# Patient Record
Sex: Male | Born: 1985 | Race: Black or African American | Hispanic: No | Marital: Single | Smoking: Former smoker
Health system: Southern US, Community
[De-identification: ages and names within clinical notes are randomized; demographics above are authoritative.]

## PROBLEM LIST (undated history)

## (undated) DIAGNOSIS — I10 Essential (primary) hypertension: Secondary | ICD-10-CM

## (undated) DIAGNOSIS — N189 Chronic kidney disease, unspecified: Secondary | ICD-10-CM

## (undated) DIAGNOSIS — I517 Cardiomegaly: Secondary | ICD-10-CM

## (undated) HISTORY — DX: Chronic kidney disease, unspecified: N18.9

## (undated) MED FILL — FIDAXOMICIN 200 MG TABLET: 200 200 mg | ORAL | 8 days supply | Qty: 16 | Fill #0

## (undated) NOTE — ED Provider Notes (Signed)
 Formatting of this note is different from the original. Images from the original note were not included. CHIEF COMPLAINT Chief Complaint  Patient presents with   Vascular Access Problem    Blisters and oozing from access site    HPI Austin Hardy is a 65 y.o. male who presents with rash to the left forearm.  Patient is a dialysis patient and has a fistula.  He says the last couple times he has had dialysis he had longer than usual bleeding and the nurse at dialysis used iodine and latex adhesive both of which he is allergic to.  He reports two square shaped patches that are oozing clear fluid and are pruritic and burning.  He says he believes he is allergic to what they are using.  Has not taken anything over-the-counter.  REVIEW OF SYSTEMS See HPI for further details. Review of systems otherwise negative.   PAST MEDICAL HISTORY Past Medical History:  Diagnosis Date   Blood transfusion, without reported diagnosis    Clostridium difficile colitis 10/12/2022   Epigastric pain    Fistula, arteriovenous, acquired    has old fistula right arm, may be used for secondary fistula creation. is to see vascular   Hemodialysis    m-w-f  in [place]   Hepatitis C antibody positive in blood 11/08/2022   Hyperlipidemia 01/08/2023   Hypertension 01/08/2023   Kidney disease approx 4 weeks   esrd dialysis m- w- f [place]   Kidney transplant rejection (HHS/HCC)    on hemodialysis   Limb alert care status    Right arm (has old fistula not completely thrombosed. May be used for secondary fistula creation)   Nausea and vomiting, unspecified vomiting type    S/P dialysis catheter insertion    chest   FAMILY HISTORY Family History  Problem Relation Name Age of Onset   Hypertension Mother     Hypertension Father     SOCIAL HISTORY Social History   Tobacco Use   Smoking status: Never    Passive exposure: Never   Smokeless tobacco: Never  Vaping Use   Vaping status: Never Used   Substance Use Topics   Alcohol use: No   Drug use: Yes    Frequency: 2.0 times per week    Types: Marijuana   CURRENT MEDICATIONS  Home Medications    TAKE these medications      Sig Dispense/Refill Start Comment  B complex-vitamin C-folic acid  tablet Commonly known as: NEPHROVITE RX  Take 1 Tablet by mouth Once Daily.  Refill: 0  cholecalciferol (vitamin D3) Tab tablet Commonly known as: VITAMIN D3  Take 2,000 Units by mouth Every morning.  Refill: 0  gabapentin 100 mg capsule Commonly known as: NEURONTIN  Take 100 mg by mouth Three times a day.  Refill: 0  oxyCODONE 10 mg immediate release tablet Commonly known as: ROXICODONE  Take 10 mg by mouth Once daily as needed for Pain.  Refill: 0  * predniSONE  10 mg tablet Commonly known as: DELTASONE   Take 3 Tabs by mouth Once Daily.  Refill: 0  * predniSONE  10 mg tablet Commonly known as: DELTASONE   Take 50mg  (5 tabs) on day 1, then take 40mg  (4 tabs) on day 2, then take 30mg  (3 tabs) on day 3, then take 20mg  (2 tabs) on day 4, then take 10mg  (1 tab) on day 5, then stop.  Dispense: 15 Tablet Refill: 0  traMADoL  50 mg tablet Commonly known as: ULTRAM   Take 50 mg by mouth Three  times a day as needed for Pain.  Refill: 0  VELPHORO 500 mg Chew Generic drug: sucroferric oxyhydroxide  Take 500 mg by mouth Three times a day. With meals  Refill: 0     * This list has 2 medication(s) that are the same as other medications prescribed for you. Read the directions carefully, and ask your doctor or other care provider to review them with you.        ALLERGIES Allergies  Allergen Reactions   Nsaids (Non-Steroidal Anti-Inflammatory Drug) Other (See Comments)    CANNOT TAKE DUE TO KIDNEY FAILURE  CANNOT TAKE DUE TO KIDNEY FAILURE    PHYSICAL EXAM VITAL SIGNS:  ED Triage Vitals [11/02/23 0413]  BP BP Manual or Automatic? Patient Position BP Location Heart Rate (Monitor)  140/83 Automatic -- -- --   Pulse  Pulse Source Respirations Temp Temp src  78 -- 16 99.2 F (37.3 C) --   SpO2 SPO2 Location O2 Delivery O2 Device O2 Flow Rate (l/min)  98 % -- Room air -- --   FIO2 (%) Pain Intensity 1 Exacerbated By Relieved By Quality  -- -- -- -- --   Duration      --       Constitutional: Well developed, Well nourished, No acute distress, Non-toxic appearance.  HENT: Normocephalic, Atraumatic, Bilateral external ears normal, Oropharynx moist, Nose normal.  Eyes: EOMI, Conjunctiva normal, No discharge. No icterus.   Neck: Normal range of motion, No tenderness, Supple, No stridor.  Cardiovascular: Regular rate and rhythm. No murmur.  Thorax & Lungs: No respiratory distress Skin: Over left AV fistula there are 2 square patches approximately 2 x 2 cm with grouped vessels with mild serous drainage.  No overlying erythema warmth or induration, no purulent drainage.  No crepitus. Extremities: AV fistula left forearm with palpable thrill. Neurologic: Alert & oriented x 3, Normal motor function, Normal sensory function, No focal deficits noted.  Psychiatric: Affect normal, Judgment normal, Mood normal.   RADIOLOGY/PROCEDURES  Results   None   No results found for this or any previous visit (from the past 24 hours).  COURSE & MEDICAL DECISION MAKING Pertinent Labs & Imaging studies reviewed. (See chart for details)  Patient presents with what looks like a contact dermatitis to his left forearm.  Do not see evidence of infection.  Will treat with steroid and antihistamine.  Patient advised to take antihistamines at home and will send in steroid taper.  He was advised to discontinue the use of the iodine gauze.  Medications  predniSONE  (DELTASONE ) tab 60 mg (has no administration in time range)  diphenhydrAMINE HCL (BENADRYL) cap 25 mg (has no administration in time range)     ED Disposition     ED Disposition  Discharge After Treatment   Condition  Stable   Comment  --      Discharge  References/Attachments   Contact Dermatitis ([facility](R) Instructions(ER/ED)) (English)   FINAL IMPRESSION Final diagnoses:  Allergic contact dermatitis due to adhesives     Portions of this note were created using [employer]. While attempts were made to ensure accuracy, some errors may be made during transcription.  In the event narcotics were prescribed, KASPER checked.   Electronically signed by Banks, Katelyn, MD at 11/02/2023  4:28 AM EDT

## (undated) NOTE — ED Notes (Signed)
 Formatting of this note might be different from the original. Discharge instructions and education provided at this time. Patient/family verbalizes understanding of discharge instructions and education. Follow up care and instructions reviewed with patient. No complaints noted at this time. VSS. Pt declines wheelchair and ambulated out of ED at this time to private vehicle.   Electronically signed by Zackary Millman, RN at 11/02/2023  4:58 AM EDT

---
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
## 2011-03-11 ENCOUNTER — Emergency Department (HOSPITAL_COMMUNITY): Payer: Self-pay

## 2011-03-11 ENCOUNTER — Emergency Department (HOSPITAL_COMMUNITY)
Admission: EM | Admit: 2011-03-11 | Discharge: 2011-03-11 | Disposition: A | Discharge status: Home / Self Care | Payer: Self-pay | Attending: Emergency Medicine | Admitting: Emergency Medicine

## 2011-03-11 ENCOUNTER — Encounter: Payer: Self-pay | Admitting: *Deleted

## 2011-03-11 DIAGNOSIS — R0602 Shortness of breath: Secondary | ICD-10-CM | POA: Insufficient documentation

## 2011-03-11 DIAGNOSIS — I10 Essential (primary) hypertension: Secondary | ICD-10-CM | POA: Insufficient documentation

## 2011-03-11 DIAGNOSIS — R42 Dizziness and giddiness: Secondary | ICD-10-CM | POA: Insufficient documentation

## 2011-03-11 DIAGNOSIS — R110 Nausea: Secondary | ICD-10-CM | POA: Insufficient documentation

## 2011-03-11 HISTORY — DX: Essential (primary) hypertension: I10

## 2011-03-11 LAB — GLUCOSE, CAPILLARY: Glucose-Capillary: 90 mg/dL (ref 70–99)

## 2011-03-11 MED ORDER — LISINOPRIL 10 MG PO TABS
10.0000 mg | ORAL_TABLET | ORAL | Status: DC
  Filled 2011-03-11: qty 1

## 2011-03-11 NOTE — ED Notes (Signed)
Pt denies any further questions upon discharge. 

## 2011-03-11 NOTE — ED Notes (Signed)
Pt resting quietly with eyes closed, no s/s of any pain or distress noted. Family at bedside, pt is awaiting disposition and re-evaluation by MD. Will continue to monitor pt.

## 2011-03-11 NOTE — ED Notes (Signed)
Patient here with c/o having elevated blood pressure.  States that he has history of HTN but was taken off medications when he was in [employer], 8 years ago.  Patient also complains of shortness of breath.  Patient also c/o dizziness and blurred vision.  Patient went to the health clinic and all his test were negative for diabetes.

## 2011-03-11 NOTE — ED Notes (Signed)
Pt reports having high blood pressure with hx and taken off of meds years ago. Pt denies any pain or complaints at this time.

## 2011-03-11 NOTE — ED Notes (Signed)
CBG was 90 

## 2011-03-11 NOTE — ED Provider Notes (Signed)
History     CSN: [id number] Arrival date & time: 03/11/2011 12:43 AM   First MD Initiated Contact with Patient 03/11/11 0148      Chief Complaint  Patient presents with  . Hypertension    (Consider location/radiation/quality/duration/timing/severity/associated sxs/prior treatment) Patient is a 25 y.o. male presenting with hypertension. The history is provided by the patient. The history is limited by the condition of the patient. No language interpreter was used.  Hypertension This is a recurrent problem. The current episode started more than 1 year ago. The problem has been gradually worsening. Associated symptoms include nausea. Pertinent negatives include no abdominal pain, chest pain, chills, congestion, coughing, diaphoresis, fatigue, fever, headaches, neck pain, numbness, sore throat, vomiting or weakness. The symptoms are aggravated by nothing. He has tried nothing for the symptoms.  Hypertension This is a recurrent problem. The current episode started more than 1 year ago. The problem has been gradually worsening. Pertinent negatives include no chest pain, no abdominal pain and no headaches. The symptoms are aggravated by nothing. He has tried nothing for the symptoms.   Here with intermittant lightheadedness and thinks that his high blood pressure is back.  Also wants to know if he has diabetes.  He was treated for hypertension in high school but was taken off the meds after he lost weight and his b/p was normal.  Family history of hypertension and diabetes. Needs a PCP and would like [place] to recommend.  Also states that he has been SOB intermittantly but no chest pain.  No acute distress noted or SOB. Ambulating without difficulty or dizziness.  Past Medical History  Diagnosis Date  . Hypertension     History reviewed. No pertinent past surgical history.  Family History  Problem Relation Age of Onset  . Diabetes Mother   . Hypertension Mother   . Diabetes Father   .  Hypertension Father     History  Substance Use Topics  . Smoking status: Current Some Day Smoker -- 0.5 packs/day  . Smokeless tobacco: Not on file  . Alcohol Use: Yes      Review of Systems  Constitutional: Negative for fever, chills, diaphoresis and fatigue.  HENT: Negative for congestion, sore throat and neck pain.   Respiratory: Negative for cough.   Cardiovascular: Negative for chest pain.  Gastrointestinal: Positive for nausea. Negative for vomiting and abdominal pain.  Neurological: Negative for weakness, numbness and headaches.  All other systems reviewed and are negative.    Allergies  Review of patient's allergies indicates no known allergies.  Home Medications   Current Outpatient Rx  Name Route Sig Dispense Refill  . ALBUTEROL SULFATE HFA 108 (90 BASE) MCG/ACT IN AERS Inhalation Inhale 2 puffs into the lungs once.      Marland Kitchen OVER THE COUNTER MEDICATION Oral Take 4 tablets by mouth once. Airborne otc Hold while in hospital       BP 139/75  Pulse 77  Temp(Src) 98.2 F (36.8 C) (Oral)  Resp 16  SpO2 100%  Physical Exam  Nursing note and vitals reviewed. Constitutional: He is oriented to person, place, and time. He appears well-developed and well-nourished. No distress.  HENT:  Head: Normocephalic.  Eyes: Pupils are equal, round, and reactive to light.  Neck: Normal range of motion.  Cardiovascular: Normal rate.  Exam reveals no gallop and no friction rub.   No murmur heard. Pulmonary/Chest: Effort normal and breath sounds normal. No respiratory distress. He has no wheezes.  Abdominal: Soft.  Musculoskeletal: Normal  range of motion. He exhibits no edema and no tenderness.  Neurological: He is alert and oriented to person, place, and time.  Skin: Skin is warm and dry.  Psychiatric: He has a normal mood and affect.    ED Course  Procedures (including critical care time)   Labs Reviewed  GLUCOSE, CAPILLARY  POCT CBG MONITORING   Dg Chest 2  View  03/11/2011  *RADIOLOGY REPORT*  Clinical Data: Shortness of breath  CHEST - 2 VIEW  Comparison: None.  Findings: Lungs are clear. No pleural effusion or pneumothorax. The cardiomediastinal contours are within normal limits. The visualized bones and soft tissues are without significant appreciable abnormality.  IMPRESSION: No acute cardiopulmonary process identified.  Original Report Authenticated By: Waneta Martins, M.D.     1. Hypertension       MDM  C/o ? Hypertension and ? Diabetes.    Monitored in er and he was hypertensive initially but 139/75 when resting. CBG was 90.  Referred to PCP's for follow up.  Instructed to keep B/P journal.  Did not start b/p meds today.  Chest x-ray unremarkable.          Jethro Bastos, NP 03/12/11 1439  Medical screening examination/treatment/procedure(s) were performed by non-physician practitioner and as supervising physician I was immediately available for consultation/collaboration.   Sunnie Nielsen, MD 03/13/11 [phone]

## 2011-05-25 HISTORY — PX: AV FISTULA PLACEMENT: SHX1204

## 2011-08-01 NOTE — Unmapped (Signed)
Formatting of this note might be different from the original.  Phone hx completed-Instructed patient to [facility] after MN, No make-up, jewelry, nail polish, excessive lotions, no smoking, gum, mints.  Follow MD instructions.  Show the night before and morning of, paying particular attention to surgery area.  Have someone with you to drive you home and be with you for 24 hours after surgery.  Verbalized understanding all instructions.      Electronically signed by Lanetta Inch at 08/01/2011 11:40 AM EDT

## 2012-03-01 ENCOUNTER — Emergency Department (INDEPENDENT_AMBULATORY_CARE_PROVIDER_SITE_OTHER)
Admission: EM | Admit: 2012-03-01 | Discharge: 2012-03-01 | Disposition: A | Discharge status: Home / Self Care | Payer: PRIVATE HEALTH INSURANCE | Source: Home / Self Care | Attending: Family Medicine | Admitting: Family Medicine

## 2012-03-01 ENCOUNTER — Encounter (HOSPITAL_COMMUNITY): Payer: Self-pay | Admitting: *Deleted

## 2012-03-01 DIAGNOSIS — H1045 Other chronic allergic conjunctivitis: Secondary | ICD-10-CM

## 2012-03-01 DIAGNOSIS — H1011 Acute atopic conjunctivitis, right eye: Secondary | ICD-10-CM

## 2012-03-01 MED ORDER — CETIRIZINE-PSEUDOEPHEDRINE ER 5-120 MG PO TB12: 1.0000 | ORAL_TABLET | Freq: Two times a day (BID) | ORAL | Status: DC

## 2012-03-01 MED ORDER — PREDNISONE 20 MG PO TABS: ORAL_TABLET | ORAL | Status: DC

## 2012-03-01 MED ORDER — KETOTIFEN FUMARATE 0.025 % OP SOLN: 1.0000 [drp] | Freq: Two times a day (BID) | OPHTHALMIC | Status: DC

## 2012-03-01 MED ORDER — ERYTHROMYCIN 5 MG/GM OP OINT: TOPICAL_OINTMENT | OPHTHALMIC | Status: DC

## 2012-03-01 NOTE — ED Notes (Signed)
Pt reports eye pain, drainage, redness for the past two days with no known injury

## 2012-03-01 NOTE — ED Provider Notes (Signed)
History     CSN: [id number]  Arrival date & time 03/01/12  2952   First MD Initiated Contact with Patient 03/01/12 0940      Chief Complaint  Patient presents with  . Eye Pain    (Consider location/radiation/quality/duration/timing/severity/associated sxs/prior treatment) HPI Comments: 26 year old male with history of asthma and allergic rhinitis. Here complaining of nasal congestion, sneezing and clear rhinorrhea for about a week. He's eyes have been itching as well and he has been rubbing them forcefully yesterday; right eye has been swollen and red for the last 2 days. Right eye with clear drainage and severe itchiness and burning type of pain. Denies purulent secretions. No pain with eye movement. No fever or chills.   Past Medical History  Diagnosis Date  . Hypertension     History reviewed. No pertinent past surgical history.  Family History  Problem Relation Age of Onset  . Diabetes Mother   . Hypertension Mother   . Diabetes Father   . Hypertension Father     History  Substance Use Topics  . Smoking status: Former Smoker -- 0.5 packs/day  . Smokeless tobacco: Not on file  . Alcohol Use: No      Review of Systems  Constitutional: Negative for fever and chills.  HENT: Positive for congestion, rhinorrhea, sneezing and postnasal drip. Negative for ear pain, sore throat, facial swelling and neck pain.   Eyes: Positive for pain, redness and itching. Negative for photophobia and visual disturbance.  Respiratory: Negative for cough, shortness of breath and wheezing.   Gastrointestinal: Negative for nausea and vomiting.  Skin: Negative for rash.  Neurological: Negative for dizziness and headaches.    Allergies  Review of patient's allergies indicates no known allergies.  Home Medications   Current Outpatient Rx  Name  Route  Sig  Dispense  Refill  . METOPROLOL TARTRATE 100 MG PO TABS   Oral   Take 100 mg by mouth 2 (two) times daily.         . ALBUTEROL  SULFATE HFA 108 (90 BASE) MCG/ACT IN AERS   Inhalation   Inhale 2 puffs into the lungs once.           Marland Kitchen CETIRIZINE-PSEUDOEPHEDRINE ER 5-120 MG PO TB12   Oral   Take 1 tablet by mouth 2 (two) times daily.   30 tablet   0   . ERYTHROMYCIN 5 MG/GM OP OINT      Place a 1/2 inch ribbon of ointment into the lower eyelid at night time daily for 5 days   1 g   0   . KETOTIFEN FUMARATE 0.025 % OP SOLN   Right Eye   Place 1 drop into the right eye 2 (two) times daily.   5 mL   0   . OVER THE COUNTER MEDICATION   Oral   Take 4 tablets by mouth once. Airborne otc Hold while in hospital          . PREDNISONE 20 MG PO TABS      2 tabs po daily for 5 days   10 tablet   0     BP 145/98  Pulse 62  Temp 98.9 F (37.2 C) (Oral)  Resp 18  SpO2 100%  Physical Exam  Nursing note and vitals reviewed. Constitutional: He is oriented to person, place, and time. He appears well-developed and well-nourished. No distress.  HENT:  Head: Normocephalic and atraumatic.  Right Ear: External ear normal.  Left Ear: External ear  normal.       Nasal Congestion with erythema and severe swelling of nasal turbinates, clear rhinorrhea. pharyngeal erythema no exudates. No uvula deviation. No trismus. TM's normal  Eyes: EOM are normal. Pupils are equal, round, and reactive to light. No scleral icterus.       Bilateral conjunctival erythema and clear tearing worse in right eye also associated with mld to moderate chemosis. No periorbital erythema, induration or pain with palpation or EOM. No exudates.  Neck: Neck supple.  Cardiovascular: Normal rate, regular rhythm and normal heart sounds.   Pulmonary/Chest: Effort normal and breath sounds normal. No respiratory distress. He has no wheezes. He has no rales. He exhibits no tenderness.  Lymphadenopathy:    He has no cervical adenopathy.  Neurological: He is alert and oriented to person, place, and time.  Skin: No rash noted. He is not diaphoretic.     ED Course  Procedures (including critical care time)  Labs Reviewed - No data to display No results found.   1. Allergic conjunctivitis of right eye       MDM  Impress allergic rhinitis and allergic conjunctivitis. Treated with prednisone, cetirizine/pseudoephedrine, ketotifen ophthalmic and erythromycin ointment for lubrication and infection prophylaxis only at nighttime. Ophthalmologist on call contact information provided to patient to followup if worsening symptoms despite following treatment.        Sharin Grave, MD 03/04/12 [phone]

## 2012-06-11 ENCOUNTER — Other Ambulatory Visit: Payer: Self-pay

## 2012-06-11 DIAGNOSIS — T82598A Other mechanical complication of other cardiac and vascular devices and implants, initial encounter: Secondary | ICD-10-CM

## 2012-06-24 ENCOUNTER — Encounter: Payer: Self-pay | Admitting: Vascular Surgery

## 2012-06-25 ENCOUNTER — Ambulatory Visit: Payer: PRIVATE HEALTH INSURANCE | Admitting: Vascular Surgery

## 2012-06-26 ENCOUNTER — Encounter: Payer: Self-pay | Admitting: Vascular Surgery

## 2012-06-27 ENCOUNTER — Encounter: Payer: Self-pay | Admitting: Vascular Surgery

## 2012-06-27 ENCOUNTER — Encounter (INDEPENDENT_AMBULATORY_CARE_PROVIDER_SITE_OTHER): Payer: Medicaid Other | Admitting: Vascular Surgery

## 2012-06-27 ENCOUNTER — Ambulatory Visit (INDEPENDENT_AMBULATORY_CARE_PROVIDER_SITE_OTHER): Payer: Medicaid Other | Admitting: Vascular Surgery

## 2012-06-27 VITALS — BP 127/77 | HR 75 | Resp 16 | Ht 70.0 in | Wt 226.0 lb

## 2012-06-27 DIAGNOSIS — M7989 Other specified soft tissue disorders: Secondary | ICD-10-CM

## 2012-06-27 DIAGNOSIS — T82598A Other mechanical complication of other cardiac and vascular devices and implants, initial encounter: Secondary | ICD-10-CM

## 2012-06-27 DIAGNOSIS — M79609 Pain in unspecified limb: Secondary | ICD-10-CM

## 2012-06-27 DIAGNOSIS — N186 End stage renal disease: Secondary | ICD-10-CM

## 2012-06-27 NOTE — Progress Notes (Addendum)
[facility]  Referred by:  Cecille Aver, MD [address] [place], [place] [address]  Reason for referral: New access pain during dialysis proximally above the antecubital area.    History of Present Illness  Austin Hardy is a 27 y.o. (Apr 20, 1985) male who presents for evaluation of his existing AV fistula permanent access.  The patient is right hand dominant.  The patient has not had previous access procedures other than the R RC AVF created in March 2013.  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   He had severe pain in his upper arm during dialysis during one episode.  During the last 2 dialysis treatments he has not had any pain or report of steal syndrome.  Past Medical History  Diagnosis Date  . Hypertension   . Chronic kidney disease     Past Surgical History  Procedure Laterality Date  . Fracture surgery Left 2005    Ankle  . Av fistula placement Right March 2013    History   Social History  . Marital Status: Single    Spouse Name: N/A    Number of Children: N/A  . Years of Education: N/A   Occupational History  . Not on file.   Social History Main Topics  . Smoking status: Former Smoker -- 0.50 packs/day    Quit date: 03/27/2003  . Smokeless tobacco: Never Used  . Alcohol Use: No  . Drug Use: No  . Sexually Active: Yes   Other Topics Concern  . Not on file   Social History Narrative  . No narrative on file    Family History  Problem Relation Age of Onset  . Diabetes Mother   . Hypertension Mother   . Diabetes Father   . Hypertension Father       Current Outpatient Prescriptions on File Prior to Visit  Medication Sig Dispense Refill  . metoprolol (LOPRESSOR) 100 MG tablet Take 100 mg by mouth 2 (two) times daily.      Marland Kitchen albuterol (PROVENTIL HFA;VENTOLIN HFA) 108 (90 BASE) MCG/ACT inhaler Inhale 2 puffs into the lungs once.        . cetirizine-pseudoephedrine (ZYRTEC-D) 5-120 MG per  tablet Take 1 tablet by mouth 2 (two) times daily.  30 tablet  0  . erythromycin ophthalmic ointment Place a 1/2 inch ribbon of ointment into the lower eyelid at night time daily for 5 days  1 g  0  . ketotifen (ZADITOR) 0.025 % ophthalmic solution Place 1 drop into the right eye 2 (two) times daily.  5 mL  0  . OVER THE COUNTER MEDICATION Take 4 tablets by mouth once. Airborne otc Hold while in hospital       . predniSONE (DELTASONE) 20 MG tablet 2 tabs po daily for 5 days  10 tablet  0   No current facility-administered medications on file prior to visit.    No Known Allergies   REVIEW OF SYSTEMS:  (Positives checked otherwise negative)  CARDIOVASCULAR:  []  chest pain, []  chest pressure, []  palpitations, []  shortness of breath when laying flat, []  shortness of breath with exertion,  []  pain in feet when walking, []  pain in feet when laying flat, []  history of blood clot in veins (DVT), []  history of phlebitis, []  swelling in legs, []  varicose veins  PULMONARY:  []  productive cough, []  asthma, []  wheezing (X) seasonal asthma symptoms  NEUROLOGIC:  []  weakness in arms or legs, []  numbness  in arms or legs, []  difficulty speaking or slurred speech, []  temporary loss of vision in one eye, []  dizziness  HEMATOLOGIC:  []  bleeding problems, []  problems with blood clotting too easily  MUSCULOSKEL:  []  joint pain, []  joint swelling  GASTROINTEST:  []  vomiting blood, []  blood in stool     GENITOURINARY:  []  burning with urination, []  blood in urine  PSYCHIATRIC:  []  history of major depression  INTEGUMENTARY:  []  rashes, []  ulcers  CONSTITUTIONAL:  []  fever, [x]  chills  PRE-ADM LIVING: []  Home, []  Nursing home, []  Homeless  AMB STATUS: []  Walking, []  Walking w/ Assistance, []  Wheelchair, [] Bed ridden  For [employer] Use RECENT HEART ATTACK (<6 mon): No  CAD Sx: [x]  No, []  Asx, h/o MI, []  Stable angina, []  Unstable angina  PRIOR CHF: [x]  No, []  Asx, []  Mild, []  Moderate, []   Severe  STRESS TEST: [x]  No, []  Normal, []  + ischemia, []  + MI, []  Both  Physical Examination  Filed Vitals:   06/27/12 1440  BP: 127/77  Pulse: 75  Resp: 16  Height: 5\' 10"  (1.778 m)  Weight: 226 lb (102.513 kg)  SpO2: 100%    Body mass index is 32.43 kg/(m^2).  General: A&O x 3, WDNC Head: [place]/AT Ear/Nose/Throat: Hearing grossly intact, nares w/o erythema or drainage, oropharynx w/o Erythema/Exudate,  Eyes: PE, EOMI Neck: Supple Pulmonary: Sym exp, good air movt, CTAB, no rales, rhonchi, & wheezing Cardiac: RRR, Nl S1, S2, no Murmurs, rubs or gallops Vascular: Vessel Right Left  Radial Palpable Palpable  Carotid Palpable, without bruit Palpable, without bruit  Aorta Not palpable N/A  Femoral Palpable Palpable  Popliteal Not palpable Not palpable  PT  Palpable  Palpable  DP  Palpable  Palpable   Gastrointestinal: soft, NTND, + bowel sounds on auscultation  Musculoskeletal: M/S 5/5 throughout Extremities without ischemic changes, palpable thrill   Psychiatric: Judgment intact, Mood & affect appropriate for pt's clinical situation  Dermatologic: See M/S exam for extremity exam, no rashes otherwise noted  Lymph : No Cervical, Axillary, or Inguinal lymphadenopathy    Non-Invasive Vascular Imaging  Vein Mapping  (Date: 06/27/2012):   R arm: acceptable vein conduits include diameter 1.51 distally to .95 proximally with depth average of .24-.31  Medical Decision Making  Austin Hardy is a 27 y.o. male who presents with h/o painful R RC AVF  Based on upper arm dialysis duplex his AV fistula is without flow or dialysis problems at this time.  He is on the path to Kidney transplant through [facility].  He will follow up on an as needed basis.  Austin Sake, MD [facility] Office: [phone] Pager: [phone]  06/27/2012, 3:10 PM  Addendum  I have independently interviewed and examined the patient, and I agree with the  physician assistant's findings.  Pt has a functional R RC AVF without any obvious etiology for the prior episode of pain.  The access duplex demonstrates a fully function AVF without any obvious compromised segments.  He can follow up with [place] as needed.  Austin Sake, MD [facility] [place] Office: [phone] Pager: [phone]  06/27/2012, 5:30 PM

## 2012-09-11 ENCOUNTER — Ambulatory Visit: Payer: Medicaid Other | Attending: Family Medicine | Admitting: Internal Medicine

## 2012-09-11 VITALS — BP 154/88 | HR 67 | Temp 97.8°F | Resp 16 | Wt 223.0 lb

## 2012-09-11 DIAGNOSIS — I10 Essential (primary) hypertension: Secondary | ICD-10-CM

## 2012-09-11 DIAGNOSIS — N186 End stage renal disease: Secondary | ICD-10-CM

## 2012-09-11 LAB — CBC WITH DIFFERENTIAL/PLATELET
Basophils Absolute: 0 10*3/uL (ref 0.0–0.1)
Basophils Relative: 1 % (ref 0–1)
Eosinophils Absolute: 0.1 10*3/uL (ref 0.0–0.7)
HCT: 27.8 % — ABNORMAL LOW (ref 39.0–52.0)
MCH: 31.7 pg (ref 26.0–34.0)
MCHC: 34.2 g/dL (ref 30.0–36.0)
Monocytes Absolute: 0.4 10*3/uL (ref 0.1–1.0)
Neutro Abs: 1.9 10*3/uL (ref 1.7–7.7)
RDW: 14.5 % (ref 11.5–15.5)

## 2012-09-11 MED ORDER — LISINOPRIL 20 MG PO TABS: 20.0000 mg | ORAL_TABLET | Freq: Every day | ORAL | Status: DC

## 2012-09-11 MED ORDER — METOPROLOL TARTRATE 100 MG PO TABS: 100.0000 mg | ORAL_TABLET | Freq: Two times a day (BID) | ORAL | Status: DC

## 2012-09-11 NOTE — Progress Notes (Unsigned)
Patient ID: Austin Hardy, male   DOB: April 12, 1985, 27 y.o.   MRN: [id number]  CC:  HPI: 27 year old, with a history of hypertension, end-stage renal disease on hemodialysis 3 times a week started 11 months ago, referred to our clinic for establishing care. The patient denies any chest pain shortness of breath is mildly hypertensive but states that he has been out of his metoprolol and has not taken it for about a week. Denies any chest pain any shortness of breath today. The patient is fairly active does not smoke, wants a second opinion about whether he should continue with his hemodialysis . He is on the transplant list, I encouraged him to continue with his hemodialysis and be compliant with it. He states that he still makes a good amount of urine. He gets regular labs done with his hemodialysis   No Known Allergies Past Medical History  Diagnosis Date  . Hypertension   . Chronic kidney disease    Current Outpatient Prescriptions on File Prior to Visit  Medication Sig Dispense Refill  . albuterol (PROVENTIL HFA;VENTOLIN HFA) 108 (90 BASE) MCG/ACT inhaler Inhale 2 puffs into the lungs once.        . cetirizine-pseudoephedrine (ZYRTEC-D) 5-120 MG per tablet Take 1 tablet by mouth 2 (two) times daily.  30 tablet  0  . erythromycin ophthalmic ointment Place a 1/2 inch ribbon of ointment into the lower eyelid at night time daily for 5 days  1 g  0  . ketotifen (ZADITOR) 0.025 % ophthalmic solution Place 1 drop into the right eye 2 (two) times daily.  5 mL  0  . OVER THE COUNTER MEDICATION Take 4 tablets by mouth once. Airborne otc Hold while in hospital       . predniSONE (DELTASONE) 20 MG tablet 2 tabs po daily for 5 days  10 tablet  0  . sevelamer carbonate (RENVELA) 800 MG tablet Take 800 mg by mouth 3 (three) times daily with meals. Three tab.per meal       No current facility-administered medications on file prior to visit.   Family History  Problem Relation Age of Onset  .  Diabetes Mother   . Hypertension Mother   . Diabetes Father   . Hypertension Father    History   Social History  . Marital Status: Single    Spouse Name: N/A    Number of Children: N/A  . Years of Education: N/A   Occupational History  . Not on file.   Social History Main Topics  . Smoking status: Former Smoker -- 0.50 packs/day    Quit date: 03/27/2003  . Smokeless tobacco: Never Used  . Alcohol Use: No  . Drug Use: No  . Sexually Active: Yes   Other Topics Concern  . Not on file   Social History Narrative  . No narrative on file    Review of Systems  Constitutional: Negative for fever, chills, diaphoresis, activity change, appetite change and fatigue.  HENT: Negative for ear pain, nosebleeds, congestion, facial swelling, rhinorrhea, neck pain, neck stiffness and ear discharge.   Eyes: Negative for pain, discharge, redness, itching and visual disturbance.  Respiratory: Negative for cough, choking, chest tightness, shortness of breath, wheezing and stridor.   Cardiovascular: Negative for chest pain, palpitations and leg swelling.  Gastrointestinal: Negative for abdominal distention.  Genitourinary: Negative for dysuria, urgency, frequency, hematuria, flank pain, decreased urine volume, difficulty urinating and dyspareunia.  Musculoskeletal: Negative for back pain, joint swelling, arthralgias and gait  problem.  Neurological: Negative for dizziness, tremors, seizures, syncope, facial asymmetry, speech difficulty, weakness, light-headedness, numbness and headaches.  Hematological: Negative for adenopathy. Does not bruise/bleed easily.  Psychiatric/Behavioral: Negative for hallucinations, behavioral problems, confusion, dysphoric mood, decreased concentration and agitation.    Objective:   Filed Vitals:   09/11/12 1230  BP: 154/88  Pulse: 67  Temp: 97.8 F (36.6 C)  Resp: 16    Physical Exam  Constitutional: Appears well-developed and well-nourished. No distress.   HENT: Normocephalic. External right and left ear normal. Oropharynx is clear and moist.  Eyes: Conjunctivae and EOM are normal. PERRLA, no scleral icterus.  Neck: Normal ROM. Neck supple. No JVD. No tracheal deviation. No thyromegaly.  CVS: RRR, S1/S2 +, no murmurs, no gallops, no carotid bruit.  Pulmonary: Effort and breath sounds normal, no stridor, rhonchi, wheezes, rales.  Abdominal: Soft. BS +,  no distension, tenderness, rebound or guarding.  Musculoskeletal: Normal range of motion. No edema and no tenderness.  Lymphadenopathy: No lymphadenopathy noted, cervical, inguinal. Neuro: Alert. Normal reflexes, muscle tone coordination. No cranial nerve deficit. Skin: Skin is warm and dry. No rash noted. Not diaphoretic. No erythema. No pallor.  Psychiatric: Normal mood and affect. Behavior, judgment, thought content normal.   No results found for this basename: WBC, HGB, HCT, MCV, PLT   No results found for this basename: CREATININE, BUN, NA, K, CL, CO2    No results found for this basename: HGBA1C   Lipid Panel  No results found for this basename: chol, trig, hdl, cholhdl, vldl, ldlcalc       Assessment and plan:   Patient Active Problem List   Diagnosis Date Noted  . Mechanical complication of other vascular device, implant, and graft 06/27/2012   End-stage renal disease Patient encouraged to continue with hemodialysis 3 times a week Refilled his metoprolol and lisinopril Will draw CBC, CMP, lipid panel, hemoglobin A1c today

## 2012-09-11 NOTE — Progress Notes (Unsigned)
Patient here to establish care Has history of HTN and end stage renal disease Goes to dialysis 3 times a week

## 2012-09-13 ENCOUNTER — Encounter: Payer: Self-pay | Admitting: Internal Medicine

## 2012-09-13 ENCOUNTER — Telehealth: Payer: Self-pay | Admitting: Internal Medicine

## 2012-09-13 ENCOUNTER — Telehealth: Payer: Self-pay | Admitting: Nephrology

## 2012-09-13 LAB — COMPREHENSIVE METABOLIC PANEL
AST: 16 U/L (ref 0–37)
Albumin: 4.4 g/dL (ref 3.5–5.2)
Alkaline Phosphatase: 84 U/L (ref 39–117)
BUN: 39 mg/dL — ABNORMAL HIGH (ref 6–23)
Creat: 11.9 mg/dL (ref 0.50–1.35)
Potassium: 4.7 mEq/L (ref 3.5–5.3)
Total Bilirubin: 1.2 mg/dL (ref 0.3–1.2)

## 2012-09-13 LAB — LIPID PANEL
HDL: 36 mg/dL — ABNORMAL LOW (ref >39)
LDL Cholesterol: 26 mg/dL (ref 0–99)
Total CHOL/HDL Ratio: 2.3 Ratio
Triglycerides: 107 mg/dL (ref <150)
VLDL: 21 mg/dL (ref 0–40)

## 2012-09-13 LAB — HEMOGLOBIN A1C: Hgb A1c MFr Bld: 4.7 % (ref <5.7)

## 2012-09-13 NOTE — Telephone Encounter (Signed)
[facility] lab  called with critical lab value.  Pt Creat  level is 11.90 ,Dr. Jerral Ralph was notified  and reviewed the chart. He didn not feel the need to have the patient to come to the ED   since he is in  end-stage renal disease on hemodialysis 3 times a week .

## 2012-11-19 ENCOUNTER — Ambulatory Visit: Payer: Medicaid Other

## 2013-04-10 ENCOUNTER — Other Ambulatory Visit: Payer: Self-pay | Admitting: Internal Medicine

## 2014-03-02 ENCOUNTER — Emergency Department (HOSPITAL_COMMUNITY): Payer: Medicaid Other

## 2014-03-02 ENCOUNTER — Encounter (HOSPITAL_COMMUNITY): Payer: Self-pay | Admitting: Emergency Medicine

## 2014-03-02 ENCOUNTER — Emergency Department (HOSPITAL_COMMUNITY)
Admission: EM | Admit: 2014-03-02 | Discharge: 2014-03-03 | Disposition: A | Discharge status: Home / Self Care | Payer: Medicaid Other | Attending: Emergency Medicine | Admitting: Emergency Medicine

## 2014-03-02 DIAGNOSIS — R2231 Localized swelling, mass and lump, right upper limb: Secondary | ICD-10-CM | POA: Diagnosis present

## 2014-03-02 DIAGNOSIS — I129 Hypertensive chronic kidney disease with stage 1 through stage 4 chronic kidney disease, or unspecified chronic kidney disease: Secondary | ICD-10-CM | POA: Insufficient documentation

## 2014-03-02 DIAGNOSIS — R509 Fever, unspecified: Secondary | ICD-10-CM | POA: Diagnosis not present

## 2014-03-02 DIAGNOSIS — N189 Chronic kidney disease, unspecified: Secondary | ICD-10-CM | POA: Insufficient documentation

## 2014-03-02 DIAGNOSIS — Z720 Tobacco use: Secondary | ICD-10-CM | POA: Insufficient documentation

## 2014-03-02 DIAGNOSIS — Z79899 Other long term (current) drug therapy: Secondary | ICD-10-CM | POA: Insufficient documentation

## 2014-03-02 DIAGNOSIS — L03113 Cellulitis of right upper limb: Secondary | ICD-10-CM | POA: Insufficient documentation

## 2014-03-02 DIAGNOSIS — Z8781 Personal history of (healed) traumatic fracture: Secondary | ICD-10-CM | POA: Diagnosis not present

## 2014-03-02 DIAGNOSIS — Z792 Long term (current) use of antibiotics: Secondary | ICD-10-CM | POA: Insufficient documentation

## 2014-03-02 DIAGNOSIS — B350 Tinea barbae and tinea capitis: Secondary | ICD-10-CM | POA: Insufficient documentation

## 2014-03-02 LAB — COMPREHENSIVE METABOLIC PANEL
ALT: 285 U/L — ABNORMAL HIGH (ref 0–53)
AST: 125 U/L — AB (ref 0–37)
Albumin: 3.6 g/dL (ref 3.5–5.2)
Alkaline Phosphatase: 77 U/L (ref 39–117)
Anion gap: 17 — ABNORMAL HIGH (ref 5–15)
BILIRUBIN TOTAL: 0.5 mg/dL (ref 0.3–1.2)
BUN: 62 mg/dL — AB (ref 6–23)
CHLORIDE: 91 meq/L — AB (ref 96–112)
CO2: 29 meq/L (ref 19–32)
CREATININE: 16.82 mg/dL — AB (ref 0.50–1.35)
Calcium: 9.3 mg/dL (ref 8.4–10.5)
GFR calc Af Amer: 4 mL/min — ABNORMAL LOW (ref >90)
GFR, EST NON AFRICAN AMERICAN: 3 mL/min — AB (ref >90)
Glucose, Bld: 85 mg/dL (ref 70–99)
Potassium: 4 mEq/L (ref 3.7–5.3)
Sodium: 137 mEq/L (ref 137–147)
Total Protein: 7 g/dL (ref 6.0–8.3)

## 2014-03-02 LAB — CBC WITH DIFFERENTIAL/PLATELET
BASOS ABS: 0 10*3/uL (ref 0.0–0.1)
Basophils Relative: 0 % (ref 0–1)
Eosinophils Absolute: 0.2 10*3/uL (ref 0.0–0.7)
Eosinophils Relative: 3 % (ref 0–5)
HEMATOCRIT: 27.8 % — AB (ref 39.0–52.0)
HEMOGLOBIN: 9.3 g/dL — AB (ref 13.0–17.0)
LYMPHS ABS: 1.7 10*3/uL (ref 0.7–4.0)
LYMPHS PCT: 34 % (ref 12–46)
MCH: 32.5 pg (ref 26.0–34.0)
MCHC: 33.5 g/dL (ref 30.0–36.0)
MCV: 97.2 fL (ref 78.0–100.0)
MONO ABS: 0.4 10*3/uL (ref 0.1–1.0)
MONOS PCT: 8 % (ref 3–12)
NEUTROS ABS: 2.7 10*3/uL (ref 1.7–7.7)
Neutrophils Relative %: 55 % (ref 43–77)
Platelets: 260 10*3/uL (ref 150–400)
RBC: 2.86 MIL/uL — AB (ref 4.22–5.81)
RDW: 15.8 % — ABNORMAL HIGH (ref 11.5–15.5)
WBC: 5 10*3/uL (ref 4.0–10.5)

## 2014-03-02 LAB — I-STAT CG4 LACTIC ACID, ED: LACTIC ACID, VENOUS: 0.78 mmol/L (ref 0.5–2.2)

## 2014-03-02 MED ORDER — FLUCONAZOLE 200 MG PO TABS: 200.0000 mg | ORAL_TABLET | ORAL | Status: AC

## 2014-03-02 MED ORDER — CLINDAMYCIN HCL 150 MG PO CAPS: 450.0000 mg | ORAL_CAPSULE | Freq: Four times a day (QID) | ORAL | Status: DC

## 2014-03-02 NOTE — ED Provider Notes (Signed)
CSN: [id number]     Arrival date & time 03/02/14  2133 History   First MD Initiated Contact with Patient 03/02/14 2215     Chief Complaint  Patient presents with  . Arm Swelling  . Fever  . Rash     (Consider location/radiation/quality/duration/timing/severity/associated sxs/prior Treatment) HPI  Patient's 28 year old male with a history of end-stage renal disease on dialysis who presents complaining of a rash on his scalp and right arm. He has had a red sore swollen rash on his right arm near his AV fistula for about a week and a half. He has been on oral doxycycline for a week that has not improved very much. He has not had any fevers or chills. He was sent here by dialysis, after he developed a rash on his scalp as well. He says that he first noticed the rash on his scalp about 4-5 days ago, when he noticed it was very itchy, then shaved his head. He said he had a red irritated rash on the frontal and posterior scalp that was not painful. He says that it is itchy.   Past Medical History  Diagnosis Date  . Hypertension   . Chronic kidney disease    Past Surgical History  Procedure Laterality Date  . Fracture surgery Left 2005    Ankle  . Av fistula placement Right March 2013   Family History  Problem Relation Age of Onset  . Diabetes Mother   . Hypertension Mother   . Diabetes Father   . Hypertension Father    History  Substance Use Topics  . Smoking status: Current Every Day Smoker -- 0.50 packs/day    Last Attempt to Quit: 03/27/2003  . Smokeless tobacco: Never Used  . Alcohol Use: No    Review of Systems  Constitutional: Negative for fever and fatigue.  Respiratory: Negative for cough and choking.   Gastrointestinal: Negative for abdominal pain and abdominal distention.  Skin: Positive for rash.  All other systems reviewed and are negative.     Allergies  Review of patient's allergies indicates no known allergies.  Home Medications   Prior to Admission  medications   Medication Sig Start Date End Date Taking? Authorizing Provider  lisinopril (PRINIVIL,ZESTRIL) 20 MG tablet Take 20 mg by mouth daily.   Yes Historical Provider, MD  metoprolol succinate (TOPROL-XL) 100 MG 24 hr tablet Take 100 mg by mouth daily. Take with or immediately following a meal.   Yes Historical Provider, MD  sevelamer carbonate (RENVELA) 800 MG tablet Take 2,400 mg by mouth 3 (three) times daily with meals. Three tab.per meal   Yes Historical Provider, MD  albuterol (PROVENTIL HFA;VENTOLIN HFA) 108 (90 BASE) MCG/ACT inhaler Inhale 2 puffs into the lungs once.      Historical Provider, MD  cetirizine-pseudoephedrine (ZYRTEC-D) 5-120 MG per tablet Take 1 tablet by mouth 2 (two) times daily. Patient not taking: Reported on 03/02/2014 03/01/12   Christin FudgeAdlih Moreno-Coll, MD  clindamycin (CLEOCIN) 150 MG capsule Take 3 capsules (450 mg total) by mouth 4 (four) times daily. X 7 days 03/02/14   Erskine Emeryhris Carisa Backhaus, MD  erythromycin ophthalmic ointment Place a 1/2 inch ribbon of ointment into the lower eyelid at night time daily for 5 days Patient not taking: Reported on 03/02/2014 03/01/12   Christin FudgeAdlih Moreno-Coll, MD  fluconazole (DIFLUCAN) 200 MG tablet Take 1 tablet (200 mg total) by mouth once a week. 03/02/14 04/20/14  Erskine Emeryhris Sheriece Jefcoat, MD  ketotifen (ZADITOR) 0.025 % ophthalmic solution Place 1 drop  into the right eye 2 (two) times daily. Patient not taking: Reported on 03/02/2014 03/01/12   Adlih Moreno-Coll, MD  lisinopril (PRINIVIL,ZESTRIL) 20 MG tablet TAKE ONE TABLET BY MOUTH ONCE DAILY 04/10/13   Richarda Overlie, MD  metoprolol (LOPRESSOR) 100 MG tablet Take 1 tablet (100 mg total) by mouth 2 (two) times daily. Patient not taking: Reported on 03/02/2014 09/11/12   Richarda Overlie, MD  OVER THE COUNTER MEDICATION Take 4 tablets by mouth once. Airborne [employer] while in hospital     Historical Provider, MD  predniSONE (DELTASONE) 20 MG tablet 2 tabs po daily for 5 days Patient not taking: Reported on 03/02/2014  03/01/12   Adlih Moreno-Coll, MD   BP 162/105 mmHg  Pulse 75  Temp(Src) 99.2 F (37.3 C)  Resp 12  Ht 5\' 11"  (1.803 m)  Wt 161 lb (73.029 kg)  BMI 22.46 kg/m2  SpO2 100% Physical Exam  Constitutional: He is oriented to person, place, and time. He appears well-developed and well-nourished. No distress.  HENT:  Head: Normocephalic and atraumatic.  Eyes: Pupils are equal, round, and reactive to light.  Neck: Normal range of motion. Neck supple. No thyromegaly present.  Cardiovascular: Normal rate and regular rhythm.   Pulmonary/Chest: Effort normal and breath sounds normal. No respiratory distress.  Abdominal: Soft. He exhibits no distension.  Musculoskeletal: Normal range of motion.  Neurological: He is alert and oriented to person, place, and time.  Skin: Skin is warm.  Nursing note and vitals reviewed.   ED Course  Procedures (including critical care time) Labs Review Labs Reviewed  CBC WITH DIFFERENTIAL - Abnormal; Notable for the following:    RBC 2.86 (*)    Hemoglobin 9.3 (*)    HCT 27.8 (*)    RDW 15.8 (*)    All other components within normal limits  COMPREHENSIVE METABOLIC PANEL - Abnormal; Notable for the following:    Chloride 91 (*)    BUN 62 (*)    Creatinine, Ser 16.82 (*)    AST 125 (*)    ALT 285 (*)    GFR calc non Af Amer 3 (*)    GFR calc Af Amer 4 (*)    Anion gap 17 (*)    All other components within normal limits  CULTURE, BLOOD (ROUTINE X 2)  CULTURE, BLOOD (ROUTINE X 2)  URINE CULTURE  URINALYSIS, ROUTINE W REFLEX MICROSCOPIC  I-STAT CG4 LACTIC ACID, ED    Imaging Review Dg Chest 2 View  03/02/2014   CLINICAL DATA:  Right arm infection, fever  EXAM: CHEST  2 VIEW  COMPARISON:  03/11/2011  FINDINGS: The heart size and mediastinal contours are within normal limits. Both lungs are clear. The visualized skeletal structures are unremarkable.  IMPRESSION: No active cardiopulmonary disease.   Electronically Signed   By: Elige Ko   On:  03/02/2014 22:37     EKG Interpretation None      MDM   Final diagnoses:  Tinea capitis  Cellulitis of right upper extremity   28 year old male with cellulitis and tinea capitis. Patient has mild induration and redness of his right forearm near his AV fistula, which has a good palpable thrill, and no evidence of erosion into his fistula. Scalp rash is round, with a raised border, and central hypopigmentation consistent with tinea capitis. We'll start him on once weekly Diflucan and have him wash with [facility]. As the patient cellulitis does not appear to be improving on his doxycycline, will switch him to  clindamycin, and have him follow up with his nephrologist.  Erskine Emery, MD 03/03/14 [id number]  Enid Skeens, MD 03/08/14 [phone]

## 2014-03-02 NOTE — ED Notes (Signed)
Patient went to dialysis last Tuesday, was accessed in right fistula, now arm is swollen, hand is swollen and patient is clammy and warm to the touch.  Patient is now having areas on his face and head of a rash, bilaterally on head and face.  Patient states that it itches.  He has been feeling febrile.  Patient is CAOx3.

## 2014-03-02 NOTE — Discharge Instructions (Signed)
WASH YOUR SCALP WITH SELSUN BLUE 3 TIMES A WEEK  Cellulitis Cellulitis is an infection of the skin and the tissue beneath it. The infected area is usually red and tender. Cellulitis occurs most often in the arms and lower legs.  CAUSES  Cellulitis is caused by bacteria that enter the skin through cracks or cuts in the skin. The most common types of bacteria that cause cellulitis are staphylococci and streptococci. SIGNS AND SYMPTOMS   Redness and warmth.  Swelling.  Tenderness or pain.  Fever. DIAGNOSIS  Your health care provider can usually determine what is wrong based on a physical exam. Blood tests may also be done. TREATMENT  Treatment usually involves taking an antibiotic medicine. HOME CARE INSTRUCTIONS   Take your antibiotic medicine as directed by your health care provider. Finish the antibiotic even if you start to feel better.  Keep the infected arm or leg elevated to reduce swelling.  Apply a warm cloth to the affected area up to 4 times per day to relieve pain.  Take medicines only as directed by your health care provider.  Keep all follow-up visits as directed by your health care provider. SEEK MEDICAL CARE IF:   You notice red streaks coming from the infected area.  Your red area gets larger or turns dark in color.  Your bone or joint underneath the infected area becomes painful after the skin has healed.  Your infection returns in the same area or another area.  You notice a swollen bump in the infected area.  You develop new symptoms.  You have a fever. SEEK IMMEDIATE MEDICAL CARE IF:   You feel very sleepy.  You develop vomiting or diarrhea.  You have a general ill feeling (malaise) with muscle aches and pains. MAKE SURE YOU:   Understand these instructions.  Will watch your condition.  Will get help right away if you are not doing well or get worse. Document Released: 12/20/2004 Document Revised: 07/27/2013 Document Reviewed:  05/28/2011 [facility] Patient Information 2015 [place], [place]. This information is not intended to replace advice given to you by your health care provider. Make sure you discuss any questions you have with your health care provider.  Ringworm of the Scalp Tinea Capitis is also called scalp ringworm. It is a fungal infection of the skin on the scalp seen mainly in children.  CAUSES  Scalp ringworm spreads from:  Other people.  Pets (cats and dogs) and animals.  Bedding, hats, combs or brushes shared with an infected person  Theater seats that an infected person sat in. SYMPTOMS  Scalp ringworm causes the following symptoms:  Flaky scales that look like dandruff.  Circles of thick, raised red skin.  Hair loss.  Red pimples or pustules.  Swollen glands in the back of the neck.  Itching. DIAGNOSIS  A skin scraping or infected hairs will be sent to test for fungus. Testing can be done either by looking under the microscope (KOH examination) or by doing a culture (test to try to grow the fungus). A culture can take up to 2 weeks to come back. TREATMENT   Scalp ringworm must be treated with medicine by mouth to kill the fungus for 6 to 8 weeks.  Medicated shampoos (ketoconazole or selenium sulfide shampoo) may be used to decrease the shedding of fungal spores from the scalp.  Steroid medicines are used for severe cases that are very inflamed in conjunction with antifungal medication.  It is important that any family members or pets  that have the fungus be treated. HOME CARE INSTRUCTIONS   Be sure to treat the rash completely - follow your caregiver's instructions. It can take a month or more to treat. If you do not treat it long enough, the rash can come back.  Watch for other cases in your family or pets.  Do not share brushes, combs, barrettes, or hats. Do not share towels.  Combs, brushes, and hats should be cleaned carefully and natural bristle brushes must be thrown  away.  It is not necessary to shave the scalp or wear a hat during treatment.  Children may attend school once they start treatment with the oral medicine.  Be sure to follow up with your caregiver as directed to be sure the infection is gone. SEEK MEDICAL CARE IF:   Rash is worse.  Rash is spreading.  Rash returns after treatment is completed.  The rash is not better in 2 weeks with treatment. Fungal infections are slow to respond to treatment. Some redness may remain for several weeks after the fungus is gone. SEEK IMMEDIATE MEDICAL CARE IF:  The area becomes red, warm, tender, and swollen.  Pus is oozing from the rash.  You or your child has an oral temperature above 102 F (38.9 C), not controlled by medicine. Document Released: 03/09/2000 Document Revised: 06/04/2011 Document Reviewed: 04/21/2008 [facility] Patient Information 2015 [place], [place]. This information is not intended to replace advice given to you by your health care provider. Make sure you discuss any questions you have with your health care provider.

## 2014-03-02 NOTE — ED Notes (Signed)
Patient states that he has been feeling bad ?fever (tonight 99.2) and has a rash on his head, scabs on his face from the area that was a rash, right arm swollen and stated he has had an area on his right forearm that looked just like the areas on his head.

## 2014-03-03 LAB — URINE MICROSCOPIC-ADD ON

## 2014-03-03 LAB — URINALYSIS, ROUTINE W REFLEX MICROSCOPIC
BILIRUBIN URINE: NEGATIVE
Glucose, UA: 100 mg/dL — AB
KETONES UR: NEGATIVE mg/dL
Leukocytes, UA: NEGATIVE
Nitrite: NEGATIVE
Specific Gravity, Urine: 1.023 (ref 1.005–1.030)
UROBILINOGEN UA: 0.2 mg/dL (ref 0.0–1.0)
pH: 7.5 (ref 5.0–8.0)

## 2014-03-03 NOTE — ED Notes (Signed)
IV documented on the wrong patient

## 2014-03-03 NOTE — ED Notes (Signed)
Discharge instructions and prescriptions reviewed.  Voiced understanding.  

## 2014-03-04 LAB — URINE CULTURE
Colony Count: NO GROWTH
Culture: NO GROWTH

## 2014-03-09 LAB — CULTURE, BLOOD (ROUTINE X 2)
Culture: NO GROWTH
Culture: NO GROWTH

## 2014-04-19 ENCOUNTER — Telehealth: Payer: Self-pay | Admitting: Internal Medicine

## 2014-04-19 NOTE — Telephone Encounter (Signed)
04/19/2014 Received fax of records on patient from [facility] for upcoming appointment on 04/21/2014 with Dr. Rennis Golden, records given to [facility].  cbr

## 2014-04-20 ENCOUNTER — Emergency Department (HOSPITAL_COMMUNITY): Payer: Medicaid Other

## 2014-04-20 ENCOUNTER — Emergency Department (HOSPITAL_COMMUNITY)
Admission: EM | Admit: 2014-04-20 | Discharge: 2014-04-20 | Disposition: A | Discharge status: Home / Self Care | Payer: Medicaid Other | Attending: Emergency Medicine | Admitting: Emergency Medicine

## 2014-04-20 ENCOUNTER — Encounter (HOSPITAL_COMMUNITY): Payer: Self-pay

## 2014-04-20 DIAGNOSIS — R0602 Shortness of breath: Secondary | ICD-10-CM | POA: Insufficient documentation

## 2014-04-20 DIAGNOSIS — R1110 Vomiting, unspecified: Secondary | ICD-10-CM | POA: Insufficient documentation

## 2014-04-20 DIAGNOSIS — E119 Type 2 diabetes mellitus without complications: Secondary | ICD-10-CM | POA: Diagnosis not present

## 2014-04-20 DIAGNOSIS — N186 End stage renal disease: Secondary | ICD-10-CM | POA: Diagnosis not present

## 2014-04-20 DIAGNOSIS — E875 Hyperkalemia: Secondary | ICD-10-CM | POA: Insufficient documentation

## 2014-04-20 DIAGNOSIS — I120 Hypertensive chronic kidney disease with stage 5 chronic kidney disease or end stage renal disease: Secondary | ICD-10-CM | POA: Insufficient documentation

## 2014-04-20 DIAGNOSIS — Z79899 Other long term (current) drug therapy: Secondary | ICD-10-CM | POA: Diagnosis not present

## 2014-04-20 DIAGNOSIS — R0989 Other specified symptoms and signs involving the circulatory and respiratory systems: Secondary | ICD-10-CM

## 2014-04-20 DIAGNOSIS — F17210 Nicotine dependence, cigarettes, uncomplicated: Secondary | ICD-10-CM | POA: Insufficient documentation

## 2014-04-20 LAB — BASIC METABOLIC PANEL
Anion gap: 22 — ABNORMAL HIGH (ref 5–15)
BUN: 102 mg/dL — AB (ref 6–23)
CALCIUM: 8.8 mg/dL (ref 8.4–10.5)
CO2: 23 mmol/L (ref 19–32)
CREATININE: 22.17 mg/dL — AB (ref 0.50–1.35)
Chloride: 92 mmol/L — ABNORMAL LOW (ref 96–112)
GFR, EST AFRICAN AMERICAN: 3 mL/min — AB (ref >90)
GFR, EST NON AFRICAN AMERICAN: 2 mL/min — AB (ref >90)
GLUCOSE: 100 mg/dL — AB (ref 70–99)
Potassium: 6.9 mmol/L (ref 3.5–5.1)
Sodium: 137 mmol/L (ref 135–145)

## 2014-04-20 LAB — I-STAT TROPONIN, ED: TROPONIN I, POC: 0.05 ng/mL (ref 0.00–0.08)

## 2014-04-20 LAB — CBC
HCT: 36.6 % — ABNORMAL LOW (ref 39.0–52.0)
Hemoglobin: 12 g/dL — ABNORMAL LOW (ref 13.0–17.0)
MCH: 33.2 pg (ref 26.0–34.0)
MCHC: 32.8 g/dL (ref 30.0–36.0)
MCV: 101.4 fL — AB (ref 78.0–100.0)
Platelets: 193 10*3/uL (ref 150–400)
RBC: 3.61 MIL/uL — AB (ref 4.22–5.81)
RDW: 14.5 % (ref 11.5–15.5)
WBC: 5 10*3/uL (ref 4.0–10.5)

## 2014-04-20 LAB — CBG MONITORING, ED
GLUCOSE-CAPILLARY: 59 mg/dL — AB (ref 70–99)
GLUCOSE-CAPILLARY: 136 mg/dL — AB (ref 70–99)

## 2014-04-20 MED ORDER — LIDOCAINE HCL (PF) 1 % IJ SOLN: 5.0000 mL | INTRAMUSCULAR | Status: DC | PRN

## 2014-04-20 MED ORDER — HEPARIN SODIUM (PORCINE) 1000 UNIT/ML DIALYSIS
1000.0000 [IU] | INTRAMUSCULAR | Status: DC | PRN
Start: 2014-04-20 — End: 2014-04-20

## 2014-04-20 MED ORDER — ONDANSETRON HCL 4 MG/2ML IJ SOLN
4.0000 mg | Freq: Once | INTRAMUSCULAR | Status: AC
  Administered 2014-04-20: 4 mg via INTRAVENOUS
  Filled 2014-04-20: qty 2

## 2014-04-20 MED ORDER — ALTEPLASE 2 MG IJ SOLR: 2.0000 mg | Freq: Once | INTRAMUSCULAR | Status: DC | PRN

## 2014-04-20 MED ORDER — SODIUM CHLORIDE 0.9 % IV SOLN: 100.0000 mL | INTRAVENOUS | Status: DC | PRN

## 2014-04-20 MED ORDER — HEPARIN SODIUM (PORCINE) 1000 UNIT/ML DIALYSIS: 6000.0000 [IU] | Freq: Once | INTRAMUSCULAR | Status: DC

## 2014-04-20 MED ORDER — SODIUM POLYSTYRENE SULFONATE 15 GM/60ML PO SUSP
30.0000 g | Freq: Once | ORAL | Status: AC
  Administered 2014-04-20: 30 g via ORAL
  Filled 2014-04-20: qty 120

## 2014-04-20 MED ORDER — SODIUM POLYSTYRENE SULFONATE 15 GM/60ML PO SUSP
15.0000 g | Freq: Once | ORAL | Status: DC
  Filled 2014-04-20: qty 60

## 2014-04-20 MED ORDER — IPRATROPIUM BROMIDE 0.02 % IN SOLN
0.5000 mg | Freq: Once | RESPIRATORY_TRACT | Status: AC
  Administered 2014-04-20: 0.5 mg via RESPIRATORY_TRACT
  Filled 2014-04-20: qty 2.5

## 2014-04-20 MED ORDER — ALBUTEROL SULFATE (2.5 MG/3ML) 0.083% IN NEBU
5.0000 mg | INHALATION_SOLUTION | Freq: Once | RESPIRATORY_TRACT | Status: AC
  Administered 2014-04-20: 5 mg via RESPIRATORY_TRACT
  Filled 2014-04-20: qty 6

## 2014-04-20 MED ORDER — NEPRO/CARBSTEADY PO LIQD: 237.0000 mL | ORAL | Status: DC | PRN

## 2014-04-20 MED ORDER — DEXTROSE 50 % IV SOLN
1.0000 | Freq: Once | INTRAVENOUS | Status: AC
  Administered 2014-04-20: 50 mL via INTRAVENOUS
  Filled 2014-04-20: qty 50

## 2014-04-20 MED ORDER — PENTAFLUOROPROP-TETRAFLUOROETH EX AERO
1.0000 | INHALATION_SPRAY | CUTANEOUS | Status: DC | PRN
Start: 2014-04-20 — End: 2014-04-20

## 2014-04-20 MED ORDER — LIDOCAINE-PRILOCAINE 2.5-2.5 % EX CREA: 1.0000 "application " | TOPICAL_CREAM | CUTANEOUS | Status: DC | PRN

## 2014-04-20 NOTE — ED Notes (Signed)
Tatyana, PA-C, at the bedside.  
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
## 2014-04-20 NOTE — ED Notes (Signed)
CRITICAL VALUE ALERT  Critical value received:  Potassium 6.9  Date of notification:  04/20/2014   Time of notification:  0730  Critical value read back:Yes.    Nurse who received alert:  Heide Guile  MD notified (1st page):  Tatyana PA  Time of first page:  [phone]

## 2014-04-20 NOTE — ED Notes (Signed)
Pt is a dialysis pt, normally goes on Tue, Thur, Sat, has not received in one week due to closings, pt now SOB and vomiting. Not able to receive dialysis until 12 pm today
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
## 2014-04-20 NOTE — Procedures (Signed)
I was present at this dialysis session, have reviewed the session itself and made  appropriate changes  Vinson Moselle MD (pgr) [phone]    ([phone] 04/20/2014, 10:37 AM

## 2014-04-20 NOTE — ED Notes (Signed)
Notified PA that pt's CBG 59. Gave pt some crackers and peanut butter and juice

## 2014-04-21 ENCOUNTER — Ambulatory Visit: Payer: Medicaid Other | Admitting: Internal Medicine

## 2014-04-24 ENCOUNTER — Encounter (HOSPITAL_COMMUNITY): Payer: Self-pay | Admitting: Emergency Medicine

## 2014-04-24 ENCOUNTER — Emergency Department (HOSPITAL_COMMUNITY): Payer: Medicaid Other

## 2014-04-24 ENCOUNTER — Inpatient Hospital Stay (HOSPITAL_COMMUNITY)
Admission: EM | Admit: 2014-04-24 | Discharge: 2014-04-27 | DRG: 189 | Disposition: A | Discharge status: Home / Self Care | Payer: Medicaid Other | Attending: Internal Medicine | Admitting: Internal Medicine

## 2014-04-24 ENCOUNTER — Inpatient Hospital Stay (HOSPITAL_COMMUNITY): Payer: Medicaid Other

## 2014-04-24 DIAGNOSIS — A047 Enterocolitis due to Clostridium difficile: Secondary | ICD-10-CM | POA: Diagnosis present

## 2014-04-24 DIAGNOSIS — I428 Other cardiomyopathies: Secondary | ICD-10-CM

## 2014-04-24 DIAGNOSIS — Z9119 Patient's noncompliance with other medical treatment and regimen: Secondary | ICD-10-CM | POA: Diagnosis present

## 2014-04-24 DIAGNOSIS — K529 Noninfective gastroenteritis and colitis, unspecified: Secondary | ICD-10-CM | POA: Diagnosis present

## 2014-04-24 DIAGNOSIS — R140 Abdominal distension (gaseous): Secondary | ICD-10-CM

## 2014-04-24 DIAGNOSIS — Z8249 Family history of ischemic heart disease and other diseases of the circulatory system: Secondary | ICD-10-CM

## 2014-04-24 DIAGNOSIS — J189 Pneumonia, unspecified organism: Secondary | ICD-10-CM | POA: Diagnosis present

## 2014-04-24 DIAGNOSIS — Y95 Nosocomial condition: Secondary | ICD-10-CM | POA: Diagnosis present

## 2014-04-24 DIAGNOSIS — Z992 Dependence on renal dialysis: Secondary | ICD-10-CM | POA: Diagnosis not present

## 2014-04-24 DIAGNOSIS — N186 End stage renal disease: Secondary | ICD-10-CM | POA: Insufficient documentation

## 2014-04-24 DIAGNOSIS — E875 Hyperkalemia: Secondary | ICD-10-CM | POA: Diagnosis present

## 2014-04-24 DIAGNOSIS — E440 Moderate protein-calorie malnutrition: Secondary | ICD-10-CM | POA: Insufficient documentation

## 2014-04-24 DIAGNOSIS — I120 Hypertensive chronic kidney disease with stage 5 chronic kidney disease or end stage renal disease: Secondary | ICD-10-CM | POA: Diagnosis present

## 2014-04-24 DIAGNOSIS — Z833 Family history of diabetes mellitus: Secondary | ICD-10-CM | POA: Diagnosis not present

## 2014-04-24 DIAGNOSIS — F1290 Cannabis use, unspecified, uncomplicated: Secondary | ICD-10-CM | POA: Diagnosis present

## 2014-04-24 DIAGNOSIS — J811 Chronic pulmonary edema: Secondary | ICD-10-CM

## 2014-04-24 DIAGNOSIS — J810 Acute pulmonary edema: Principal | ICD-10-CM | POA: Diagnosis present

## 2014-04-24 DIAGNOSIS — E8770 Fluid overload, unspecified: Secondary | ICD-10-CM | POA: Diagnosis present

## 2014-04-24 DIAGNOSIS — Z9114 Patient's other noncompliance with medication regimen: Secondary | ICD-10-CM

## 2014-04-24 DIAGNOSIS — I5041 Acute combined systolic (congestive) and diastolic (congestive) heart failure: Secondary | ICD-10-CM | POA: Diagnosis present

## 2014-04-24 DIAGNOSIS — Z79899 Other long term (current) drug therapy: Secondary | ICD-10-CM

## 2014-04-24 DIAGNOSIS — R0602 Shortness of breath: Secondary | ICD-10-CM | POA: Insufficient documentation

## 2014-04-24 DIAGNOSIS — I10 Essential (primary) hypertension: Secondary | ICD-10-CM | POA: Insufficient documentation

## 2014-04-24 DIAGNOSIS — N2581 Secondary hyperparathyroidism of renal origin: Secondary | ICD-10-CM | POA: Diagnosis present

## 2014-04-24 LAB — BASIC METABOLIC PANEL
ANION GAP: 21 — AB (ref 5–15)
BUN: 104 mg/dL — ABNORMAL HIGH (ref 6–23)
CO2: 22 mmol/L (ref 19–32)
Calcium: 9.2 mg/dL (ref 8.4–10.5)
Chloride: 92 mmol/L — ABNORMAL LOW (ref 96–112)
Creatinine, Ser: 22.01 mg/dL — ABNORMAL HIGH (ref 0.50–1.35)
GFR calc Af Amer: 3 mL/min — ABNORMAL LOW (ref >90)
GFR, EST NON AFRICAN AMERICAN: 2 mL/min — AB (ref >90)
GLUCOSE: 120 mg/dL — AB (ref 70–99)
Potassium: 5.7 mmol/L — ABNORMAL HIGH (ref 3.5–5.1)
SODIUM: 135 mmol/L (ref 135–145)

## 2014-04-24 LAB — CBC WITH DIFFERENTIAL/PLATELET
BASOS PCT: 0 % (ref 0–1)
Basophils Absolute: 0 10*3/uL (ref 0.0–0.1)
EOS PCT: 2 % (ref 0–5)
Eosinophils Absolute: 0.2 10*3/uL (ref 0.0–0.7)
HCT: 39.5 % (ref 39.0–52.0)
HEMOGLOBIN: 13.6 g/dL (ref 13.0–17.0)
LYMPHS ABS: 1.2 10*3/uL (ref 0.7–4.0)
Lymphocytes Relative: 18 % (ref 12–46)
MCH: 30.3 pg (ref 26.0–34.0)
MCHC: 34.4 g/dL (ref 30.0–36.0)
MCV: 88 fL (ref 78.0–100.0)
Monocytes Absolute: 0.7 10*3/uL (ref 0.1–1.0)
Monocytes Relative: 10 % (ref 3–12)
NEUTROS ABS: 4.7 10*3/uL (ref 1.7–7.7)
NEUTROS PCT: 70 % (ref 43–77)
PLATELETS: 254 10*3/uL (ref 150–400)
RBC: 4.49 MIL/uL (ref 4.22–5.81)
RDW: 13.7 % (ref 11.5–15.5)
WBC: 6.8 10*3/uL (ref 4.0–10.5)

## 2014-04-24 LAB — I-STAT ARTERIAL BLOOD GAS, ED
ACID-BASE DEFICIT: 6 mmol/L — AB (ref 0.0–2.0)
Bicarbonate: 18.8 mEq/L — ABNORMAL LOW (ref 20.0–24.0)
O2 Saturation: 98 %
TCO2: 20 mmol/L (ref 0–100)
pCO2 arterial: 34.9 mmHg — ABNORMAL LOW (ref 35.0–45.0)
pH, Arterial: 7.34 — ABNORMAL LOW (ref 7.350–7.450)
pO2, Arterial: 114 mmHg — ABNORMAL HIGH (ref 80.0–100.0)

## 2014-04-24 LAB — I-STAT TROPONIN, ED: Troponin i, poc: 0.07 ng/mL (ref 0.00–0.08)

## 2014-04-24 LAB — MRSA PCR SCREENING: MRSA BY PCR: NEGATIVE

## 2014-04-24 LAB — BRAIN NATRIURETIC PEPTIDE: B Natriuretic Peptide: 3984.2 pg/mL — ABNORMAL HIGH (ref 0.0–100.0)

## 2014-04-24 MED ORDER — PENTAFLUOROPROP-TETRAFLUOROETH EX AERO: 1.0000 "application " | INHALATION_SPRAY | CUTANEOUS | Status: DC | PRN

## 2014-04-24 MED ORDER — LIDOCAINE-PRILOCAINE 2.5-2.5 % EX CREA: 1.0000 "application " | TOPICAL_CREAM | CUTANEOUS | Status: DC | PRN

## 2014-04-24 MED ORDER — HEPARIN SODIUM (PORCINE) 1000 UNIT/ML DIALYSIS
6000.0000 [IU] | Freq: Once | INTRAMUSCULAR | Status: DC
  Filled 2014-04-24: qty 6

## 2014-04-24 MED ORDER — NEPRO/CARBSTEADY PO LIQD: 237.0000 mL | ORAL | Status: DC | PRN

## 2014-04-24 MED ORDER — DEXTROSE 5 % IV SOLN: 2.0000 g | INTRAVENOUS | Status: DC

## 2014-04-24 MED ORDER — LISINOPRIL 20 MG PO TABS
20.0000 mg | ORAL_TABLET | Freq: Every day | ORAL | Status: DC
  Administered 2014-04-25: 20 mg via ORAL
  Filled 2014-04-24 (×2): qty 1

## 2014-04-24 MED ORDER — ALTEPLASE 2 MG IJ SOLR
2.0000 mg | Freq: Once | INTRAMUSCULAR | Status: DC | PRN
  Filled 2014-04-24: qty 2

## 2014-04-24 MED ORDER — IPRATROPIUM-ALBUTEROL 0.5-2.5 (3) MG/3ML IN SOLN
3.0000 mL | RESPIRATORY_TRACT | Status: DC | PRN
  Administered 2014-04-24: 3 mL via RESPIRATORY_TRACT
  Filled 2014-04-24 (×2): qty 3

## 2014-04-24 MED ORDER — METOPROLOL SUCCINATE ER 100 MG PO TB24: 100.0000 mg | ORAL_TABLET | Freq: Every day | ORAL | Status: DC

## 2014-04-24 MED ORDER — HEPARIN SODIUM (PORCINE) 1000 UNIT/ML DIALYSIS
1000.0000 [IU] | INTRAMUSCULAR | Status: DC | PRN
  Filled 2014-04-24: qty 1

## 2014-04-24 MED ORDER — SEVELAMER CARBONATE 800 MG PO TABS
2400.0000 mg | ORAL_TABLET | Freq: Three times a day (TID) | ORAL | Status: DC
  Administered 2014-04-25 – 2014-04-27 (×7): 2400 mg via ORAL
  Filled 2014-04-24 (×11): qty 3

## 2014-04-24 MED ORDER — SODIUM CHLORIDE 0.9 % IV SOLN: 100.0000 mL | INTRAVENOUS | Status: DC | PRN

## 2014-04-24 MED ORDER — HEPARIN SODIUM (PORCINE) 5000 UNIT/ML IJ SOLN
5000.0000 [IU] | Freq: Three times a day (TID) | INTRAMUSCULAR | Status: DC
  Administered 2014-04-24 – 2014-04-27 (×7): 5000 [IU] via SUBCUTANEOUS
  Filled 2014-04-24 (×9): qty 1

## 2014-04-24 MED ORDER — AMLODIPINE BESYLATE 10 MG PO TABS: 10.0000 mg | ORAL_TABLET | Freq: Every day | ORAL | Status: DC

## 2014-04-24 MED ORDER — LABETALOL HCL 5 MG/ML IV SOLN
10.0000 mg | Freq: Once | INTRAVENOUS | Status: AC
  Administered 2014-04-24: 10 mg via INTRAVENOUS
  Filled 2014-04-24: qty 4

## 2014-04-24 MED ORDER — IPRATROPIUM BROMIDE 0.02 % IN SOLN
RESPIRATORY_TRACT | Status: AC
  Filled 2014-04-24: qty 2.5

## 2014-04-24 MED ORDER — VANCOMYCIN HCL 10 G IV SOLR
1750.0000 mg | INTRAVENOUS | Status: AC
  Administered 2014-04-24: 1750 mg via INTRAVENOUS
  Filled 2014-04-24: qty 1750

## 2014-04-24 MED ORDER — ALBUTEROL SULFATE (2.5 MG/3ML) 0.083% IN NEBU
INHALATION_SOLUTION | RESPIRATORY_TRACT | Status: AC
  Filled 2014-04-24: qty 6

## 2014-04-24 MED ORDER — METOPROLOL SUCCINATE ER 100 MG PO TB24
100.0000 mg | ORAL_TABLET | Freq: Every day | ORAL | Status: DC
  Filled 2014-04-24: qty 1

## 2014-04-24 MED ORDER — CEFEPIME HCL 1 G IJ SOLR: 1.0000 g | Freq: Once | INTRAMUSCULAR | Status: DC

## 2014-04-24 MED ORDER — IPRATROPIUM BROMIDE 0.02 % IN SOLN
0.5000 mg | Freq: Once | RESPIRATORY_TRACT | Status: AC
  Administered 2014-04-24: 0.5 mg via RESPIRATORY_TRACT
  Filled 2014-04-24: qty 2.5

## 2014-04-24 MED ORDER — AMLODIPINE BESYLATE 10 MG PO TABS
10.0000 mg | ORAL_TABLET | Freq: Every day | ORAL | Status: DC
  Administered 2014-04-24: 10 mg via ORAL
  Filled 2014-04-24 (×3): qty 1

## 2014-04-24 MED ORDER — SODIUM CHLORIDE 0.9 % IV SOLN
100.0000 mL | INTRAVENOUS | Status: DC | PRN
Start: 2014-04-24 — End: 2014-04-24

## 2014-04-24 MED ORDER — HYDRALAZINE HCL 20 MG/ML IJ SOLN
10.0000 mg | Freq: Four times a day (QID) | INTRAMUSCULAR | Status: DC | PRN
  Administered 2014-04-24: 10 mg via INTRAVENOUS
  Filled 2014-04-24: qty 1

## 2014-04-24 MED ORDER — LIDOCAINE HCL (PF) 1 % IJ SOLN: 5.0000 mL | INTRAMUSCULAR | Status: DC | PRN

## 2014-04-24 MED ORDER — LORAZEPAM 2 MG/ML IJ SOLN
0.5000 mg | Freq: Once | INTRAMUSCULAR | Status: AC
  Administered 2014-04-24: 0.5 mg via INTRAVENOUS
  Filled 2014-04-24: qty 1

## 2014-04-24 MED ORDER — VANCOMYCIN HCL IN DEXTROSE 1-5 GM/200ML-% IV SOLN
1000.0000 mg | INTRAVENOUS | Status: DC
  Filled 2014-04-24: qty 200

## 2014-04-24 MED ORDER — VANCOMYCIN HCL IN DEXTROSE 1-5 GM/200ML-% IV SOLN
1000.0000 mg | INTRAVENOUS | Status: DC
  Administered 2014-04-24: 1000 mg via INTRAVENOUS
  Filled 2014-04-24 (×2): qty 200

## 2014-04-24 MED ORDER — DEXTROSE 5 % IV SOLN
2.0000 g | Freq: Once | INTRAVENOUS | Status: AC
  Administered 2014-04-24: 2 g via INTRAVENOUS
  Filled 2014-04-24: qty 2

## 2014-04-24 MED ORDER — DEXTROSE 5 % IV SOLN
1.0000 g | Freq: Once | INTRAVENOUS | Status: AC
  Administered 2014-04-24: 1 g via INTRAVENOUS
  Filled 2014-04-24: qty 1

## 2014-04-24 MED ORDER — ALBUTEROL SULFATE (2.5 MG/3ML) 0.083% IN NEBU
5.0000 mg | INHALATION_SOLUTION | Freq: Once | RESPIRATORY_TRACT | Status: AC
  Administered 2014-04-24: 5 mg via RESPIRATORY_TRACT
  Filled 2014-04-24: qty 6

## 2014-04-24 MED ORDER — SODIUM CHLORIDE 0.9 % IJ SOLN
3.0000 mL | Freq: Two times a day (BID) | INTRAMUSCULAR | Status: DC
  Administered 2014-04-24 – 2014-04-27 (×6): 3 mL via INTRAVENOUS

## 2014-04-24 NOTE — ED Notes (Signed)
Pt feeling anxious and air hungry.

## 2014-04-24 NOTE — ED Provider Notes (Signed)
CSN: [id number]     Arrival date & time 04/24/14  0721 History   First MD Initiated Contact with Patient 04/24/14 0725     Chief Complaint  Patient presents with  . Shortness of Breath     (Consider location/radiation/quality/duration/timing/severity/associated sxs/prior Treatment) HPI  Austin Hardy Is a 29 year old male who presents emergency Department with chief complaint of shortness of breath. He he has a long-standing history of uncontrolled hypertension since he was in his teens. He has ESRD and is on dialysis Tuesday, Thursday and Saturday. This patient was seen 4 days ago with the same complaint. He states that he has had about 1 week of shortness of breath. He describes a cough productive of clear sputum. He states that he is having sleeplessness because he cannot lay flat at night. He describes symptoms of PND and orthopnea. He also states he has dyspnea on exertion. Patient states that 2 weeks ago, he would've been able to run down the hall. He states that now he is unable to walk several feet without severe shortness of breath and having to stop. He denies chest pain. He states that he has had chills, sweats, myalgias. last dialyzed yesterday. He endorses swelling of the left leg but states that that is chronic from a previous ankle fracture.   Past Medical History  Diagnosis Date  . Hypertension   . Chronic kidney disease    Past Surgical History  Procedure Laterality Date  . Fracture surgery Left 2005    Ankle  . Av fistula placement Right March 2013   Family History  Problem Relation Age of Onset  . Diabetes Mother   . Hypertension Mother   . Diabetes Father   . Hypertension Father    History  Substance Use Topics  . Smoking status: Current Every Day Smoker -- 0.50 packs/day    Last Attempt to Quit: 03/27/2003  . Smokeless tobacco: Never Used  . Alcohol Use: No    Review of Systems  Ten systems reviewed and are negative for acute change, except as noted  in the HPI.    Allergies  Review of patient's allergies indicates no known allergies.  Home Medications   Prior to Admission medications   Medication Sig Start Date End Date Taking? Authorizing Provider  lisinopril (PRINIVIL,ZESTRIL) 20 MG tablet TAKE ONE TABLET BY MOUTH ONCE DAILY 04/10/13  Yes Richarda Overlie, MD  metoprolol succinate (TOPROL-XL) 100 MG 24 hr tablet Take 100 mg by mouth daily. Take with or immediately following a meal.   Yes Historical Provider, MD  sevelamer carbonate (RENVELA) 800 MG tablet Take 2,400 mg by mouth 3 (three) times daily with meals. Three tab.per meal   Yes Historical Provider, MD  Vitamins A & D (VITAMIN A & D) ointment Apply 1 application topically daily.   Yes Historical Provider, MD  carvedilol (COREG) 25 MG tablet Take 1 tablet (25 mg total) by mouth 2 (two) times daily with a meal. 04/27/14   Dionne Ano, MD  cetirizine-pseudoephedrine (ZYRTEC-D) 5-120 MG per tablet Take 1 tablet by mouth 2 (two) times daily. Patient not taking: Reported on 03/02/2014 03/01/12   Christin Fudge Moreno-Coll, MD  clindamycin (CLEOCIN) 150 MG capsule Take 3 capsules (450 mg total) by mouth 4 (four) times daily. X 7 days Patient not taking: Reported on 04/20/2014 03/02/14   Erskine Emery, MD  erythromycin ophthalmic ointment Place a 1/2 inch ribbon of ointment into the lower eyelid at night time daily for 5 days Patient not taking: Reported  on 03/02/2014 03/01/12   Christin Fudge Moreno-Coll, MD  hydrALAZINE (APRESOLINE) 25 MG tablet Take 1 tablet (25 mg total) by mouth 3 (three) times daily. 04/27/14   Dionne Ano, MD  isosorbide mononitrate (IMDUR) 30 MG 24 hr tablet Take 2 tablets (60 mg total) by mouth daily. 04/27/14   Dionne Ano, MD  ketotifen (ZADITOR) 0.025 % ophthalmic solution Place 1 drop into the right eye 2 (two) times daily. Patient not taking: Reported on 03/02/2014 03/01/12   Christin Fudge Moreno-Coll, MD  metroNIDAZOLE (FLAGYL) 500 MG tablet Take 1 tablet (500 mg total) by mouth every 8 (eight)  hours. 04/27/14   Dionne Ano, MD  predniSONE (DELTASONE) 20 MG tablet 2 tabs po daily for 5 days Patient not taking: Reported on 03/02/2014 03/01/12   Adlih Moreno-Coll, MD   BP 138/88 mmHg  Pulse 87  Temp(Src) 97.8 F (36.6 C) (Oral)  Resp 17  Ht 5\' 11"  (1.803 m)  Wt 170 lb 10.2 oz (77.4 kg)  BMI 23.81 kg/m2  SpO2 100% Physical Exam  Constitutional: He appears well-developed and well-nourished. No distress.  HENT:  Head: Normocephalic and atraumatic.  Eyes: Conjunctivae are normal. No scleral icterus.  Neck: Normal range of motion. Neck supple.  Cardiovascular: Normal rate, regular rhythm and normal heart sounds.   Venous distention present on the chest wall. Minimal JVD. However, patient's jugular veins are very small. No pitting edema.  Pulmonary/Chest: Effort normal and breath sounds normal. No respiratory distress. He has no wheezes.  Lungs are clear  Abdominal: Soft. There is no tenderness.  Musculoskeletal: He exhibits no edema.  Neurological: He is alert.  Skin: Skin is warm and dry. He is not diaphoretic.  Psychiatric: His behavior is normal.  Nursing note and vitals reviewed.   ED Course  Procedures (including critical care time) Labs Review Labs Reviewed  CLOSTRIDIUM DIFFICILE BY PCR - Abnormal; Notable for the following:    C difficile by pcr POSITIVE (*)    All other components within normal limits  BASIC METABOLIC PANEL - Abnormal; Notable for the following:    Potassium 5.7 (*)    Chloride 92 (*)    Glucose, Bld 120 (*)    BUN 104 (*)    Creatinine, Ser 22.01 (*)    GFR calc non Af Amer 2 (*)    GFR calc Af Amer 3 (*)    Anion gap 21 (*)    All other components within normal limits  BRAIN NATRIURETIC PEPTIDE - Abnormal; Notable for the following:    B Natriuretic Peptide 3984.2 (*)    All other components within normal limits  BASIC METABOLIC PANEL - Abnormal; Notable for the following:    BUN 51 (*)    Creatinine, Ser 14.83 (*)    GFR calc non Af  Amer 4 (*)    GFR calc Af Amer 4 (*)    All other components within normal limits  CBC - Abnormal; Notable for the following:    RBC 4.07 (*)    All other components within normal limits  CBC - Abnormal; Notable for the following:    WBC 3.8 (*)    RBC 3.59 (*)    Hemoglobin 11.9 (*)    HCT 35.5 (*)    All other components within normal limits  RENAL FUNCTION PANEL - Abnormal; Notable for the following:    Chloride 95 (*)    Glucose, Bld 105 (*)    BUN 71 (*)    Creatinine, Ser 17.67 (*)  Phosphorus 11.0 (*)    Albumin 2.7 (*)    GFR calc non Af Amer 3 (*)    GFR calc Af Amer 4 (*)    Anion gap 17 (*)    All other components within normal limits  I-STAT ARTERIAL BLOOD GAS, ED - Abnormal; Notable for the following:    pH, Arterial 7.340 (*)    pCO2 arterial 34.9 (*)    pO2, Arterial 114.0 (*)    Bicarbonate 18.8 (*)    Acid-base deficit 6.0 (*)    All other components within normal limits  MRSA PCR SCREENING  CBC WITH DIFFERENTIAL/PLATELET  HEPATITIS B SURFACE ANTIGEN  HIV ANTIBODY (ROUTINE TESTING)  I-STAT TROPOININ, ED    Imaging Review Dg Abd 1 View  04/27/2014   CLINICAL DATA:  Abdominal distention. Left lower quadrant abdominal swelling.  EXAM: ABDOMEN - 1 VIEW  COMPARISON:  None.  FINDINGS: There is no evidence of free intra-abdominal air on this supine view. No dilated bowel loops to suggest obstruction. No radiopaque calculi. Vascular or seminal vesicle calcifications are seen in the pelvis. No acute osseous abnormalities are seen.  IMPRESSION: Nonobstructive bowel gas pattern.   Electronically Signed   By: Rubye Oaks M.D.   On: 04/27/2014 01:12     EKG Interpretation   Date/Time:  Saturday April 24 2014 07:45:28 EST Ventricular Rate:  99 PR Interval:  171 QRS Duration: 86 QT Interval:  359 QTC Calculation: 461 R Axis:   -4 Text Interpretation:  Sinus rhythm Ventricular premature complex Probable  left atrial enlargement Nonspecific T  abnormalities, lateral leads No  significant change since 4 days ago Confirmed by Gwendolyn Grant  MD, BLAIR ([id number])  on 04/24/2014 7:48:05 AM      MDM   Final diagnoses:  HCAP (healthcare-associated pneumonia)  ESRD (end stage renal disease)  Acute pulmonary edema  Essential hypertension    2:30 PM BP 138/88 mmHg  Pulse 87  Temp(Src) 97.8 F (36.6 C) (Oral)  Resp 17  Ht  (1.803 m)  Wt 170 lb 10.2 oz (77.4 kg)  BMI 23.81 kg/m2  SpO2 100% Patient chest x-ray positive for what looks to be a healthcare acquired pneumonia. I feel strongly that there is a component of pulmonary edema and the patient's pro BNP is almost 4000. Have begun the patient on cefepime and vancomycin per pharmacy for renal dose dosing. It is due for dialysis today. I feel he will need dialysis center is likely a component of pulmonary edema. Patient will also need admission for his pneumonia. His creatinine appears to be at the baseline. He has mild elevation of his potassium at 5.7. Glucose is slightly elevated. His CBC is unremarkable without leukocytosis. He is afebrile and hemodynamically stable with significantly elevated blood pressure throughout his stay.    Arthor Captain, PA-C 04/27/14 1430  Elwin Mocha, MD 04/27/14 [phone]

## 2014-04-24 NOTE — Procedures (Signed)
I was present at this dialysis session, have reviewed the session itself and made  appropriate changes  Vinson Moselle MD (pgr) [phone]    [phone] 04/24/2014, 7:33 PM

## 2014-04-24 NOTE — ED Notes (Signed)
Pt reports he has dropped dry weight and on 4 diff BP meds; pt states he has been dealing with SOB for 2 weeks. Was recently seen here and cannot sleep through night without breathing difficulties. Just moved here from [place] and does not have PCP. Pt believes all this BP meds and dropping dry weight is causing poor reactions. Pt's lung sounds mostly clear with slight wheezing. Has had fistula infections but thrill and bruit.

## 2014-04-24 NOTE — H&P (Signed)
Date: 04/24/2014               Patient Name:  Austin Hardy MRN: [id number]  DOB: October 31, 1985 Age / Sex: 29 y.o., male   PCP: Cecille Aver, MD         Medical Service: Internal Medicine Teaching Service         Attending Physician: Dr. Earl Lagos, MD    First Contact: Dr. Senaida Ores Pager: [phone]  Second Contact: Dr. Yetta Barre Pager: [phone]       After Hours (After 5p/  First Contact Pager: [phone]  weekends / holidays): Second Contact Pager: [phone]   Chief Complaint: shortness of breath  History of Present Illness: Austin Hardy is a 29 yo male with PMHx of uncontrolled HTN and ESRD on HD TTS who presents for worsening shortness of breath. Patient states he has been dealing with increasing shortness of breath for the past 2 weeks. Shortness of breath is worse with exertion, but occurs even at rest. He cannot lay flat and must sleep sitting straight up. However, patient has not been able to sleep due to shortness of breath and has been watching movies for the past 3 nights. His shortness of breath is associated PND, orthopnea, DOE, productive cough with clear sputum and wheezing. He also admits to fever and chills. He denies headache, chest pain, nausea, vomiting or pain. Patient was dialyzed for a complete session yesterday. He feels that he is at his dry weight.   Patient has a history of uncontrolled hypertension diagnosed in his early teens. Patient had a kidney biopsy when he was young which was normal. Patient developed CKD and ESRD secondary to uncontrolled HTN and has been on dialysis for 3 years- TTS schedule. Patient and mother are unaware if he was ever diagnosed with a secondary cause of hypertension. Patient recently moved here about 1 year ago. He follows with nephrology but does not have a PCP. He has been compliant with HD and medications. Previous work up was done at [facility] in [place], [place] and [facility] in [place], [place] with Dr.  Chevis Pretty.   Of note, patient used to weigh 357 and currently weighs 179 pounds. Patient has poor appetite and believes he has lost most of the weight from increased activity. He used to play semi-pro football.  Meds:  (Not in a hospital admission)  Current Facility-Administered Medications  Medication Dose Route Frequency Provider Last Rate Last Dose  . albuterol (PROVENTIL) (2.5 MG/3ML) 0.083% nebulizer solution 5 mg  5 mg Nebulization Once Abigail Harris, PA-C      . albuterol (PROVENTIL) (2.5 MG/3ML) 0.083% nebulizer solution           . ipratropium (ATROVENT) 0.02 % nebulizer solution           . ipratropium (ATROVENT) nebulizer solution 0.5 mg  0.5 mg Nebulization Once Abigail Harris, PA-C      . ipratropium-albuterol (DUONEB) 0.5-2.5 (3) MG/3ML nebulizer solution 3 mL  3 mL Nebulization Q4H PRN Giovannina Mun Richardson, MD      . vancomycin (VANCOCIN) 1,750 mg in sodium chloride 0.9 % 500 mL IVPB  1,750 mg Intravenous NOW Ann Held, RPH 250 mL/hr at 04/24/14 0959 1,750 mg at 04/24/14 5784   Current Outpatient Prescriptions  Medication Sig Dispense Refill  . lisinopril (PRINIVIL,ZESTRIL) 20 MG tablet TAKE ONE TABLET BY MOUTH ONCE DAILY 60 tablet 0  . metoprolol (LOPRESSOR) 100 MG tablet Take 1 tablet (100 mg total) by  mouth 2 (two) times daily. (Patient taking differently: Take 100 mg by mouth daily. ) 120 tablet 2  . sevelamer carbonate (RENVELA) 800 MG tablet Take 2,400 mg by mouth 3 (three) times daily with meals. Three tab.per meal    . Vitamins A & D (VITAMIN A & D) ointment Apply 1 application topically daily.    . cetirizine-pseudoephedrine (ZYRTEC-D) 5-120 MG per tablet Take 1 tablet by mouth 2 (two) times daily. (Patient not taking: Reported on 03/02/2014) 30 tablet 0  . clindamycin (CLEOCIN) 150 MG capsule Take 3 capsules (450 mg total) by mouth 4 (four) times daily. X 7 days (Patient not taking: Reported on 04/20/2014) 60 capsule 0  . erythromycin ophthalmic ointment Place a 1/2  inch ribbon of ointment into the lower eyelid at night time daily for 5 days (Patient not taking: Reported on 03/02/2014) 1 g 0  . ketotifen (ZADITOR) 0.025 % ophthalmic solution Place 1 drop into the right eye 2 (two) times daily. (Patient not taking: Reported on 03/02/2014) 5 mL 0  . predniSONE (DELTASONE) 20 MG tablet 2 tabs po daily for 5 days (Patient not taking: Reported on 03/02/2014) 10 tablet 0    Allergies: Allergies as of 04/24/2014  . (No Known Allergies)   Past Medical History  Diagnosis Date  . Hypertension   . Chronic kidney disease    Past Surgical History  Procedure Laterality Date  . Fracture surgery Left 2005    Ankle  . Av fistula placement Right March 2013   Family History  Problem Relation Age of Onset  . Diabetes Mother   . Hypertension Mother   . Diabetes Father   . Hypertension Father    History   Social History  . Marital Status: Single    Spouse Name: N/A    Number of Children: N/A  . Years of Education: N/A   Occupational History  . Not on file.   Social History Main Topics  . Smoking status: Current Every Day Smoker -- 0.50 packs/day    Last Attempt to Quit: 03/27/2003  . Smokeless tobacco: Never Used  . Alcohol Use: No  . Drug Use: No  . Sexual Activity: Yes   Other Topics Concern  . Not on file   Social History Narrative    Review of Systems: General: Admits to fever, chills, fatigue, decreased appetite and diaphoresis.  Respiratory: Admits to SOB, productive cough, DOE, PND, orthopnea, chest tightness, and wheezing.   Cardiovascular: Denies chest pain and palpitations.  Gastrointestinal: Denies nausea, vomiting, abdominal pain Musculoskeletal: Denies myalgias Skin: Denies pallor Neurological: Denies dizziness, headaches, weakness, lightheadedness Psychiatric/Behavioral: Admits to sleep disturbance  Physical Exam: Filed Vitals:   04/24/14 0930 04/24/14 1000 04/24/14 1030 04/24/14 1100  BP: 175/133 179/128 192/144 187/142    Pulse: 122 102 103 100  Temp:      TempSrc:      Resp: 13 20 23 23   SpO2: 99% 95% 98% 100%   General: Vital signs reviewed.  Patient is well-developed and well-nourished, in mild acute distress and cooperative with exam.  Cardiovascular: Tachycardic, regular rhythm, S1 normal, S2 normal, no murmurs, gallops, or rubs. Pulmonary/Chest: Coarse breath sounds with diffuse inspiratory and expiratory wheezes and rhonchi. No rales auscultated Abdominal: Soft, non-tender, non-distended, BS + Extremities: No lower extremity edema bilaterally. R upper extremity AVF with good thrill and bruit.  Neurological: A&O x3 Skin: Warm, dry and intact. No rashes or erythema. Psychiatric: Agitated  Lab results: Basic Metabolic Panel:  Recent Labs  04/24/14 0746  NA 135  K 5.7*  CL 92*  CO2 22  GLUCOSE 120*  BUN 104*  CREATININE 22.01*  CALCIUM 9.2   CBC:  Recent Labs  04/24/14 0746  WBC 6.8  NEUTROABS 4.7  HGB 13.6  HCT 39.5  MCV 88.0  PLT 254   Imaging results:  Dg Chest 2 View  04/24/2014   CLINICAL DATA:  Shortness of breath.  EXAM: CHEST  2 VIEW  COMPARISON:  April 20, 2014.  FINDINGS: Stable cardiomegaly. No pneumothorax or significant pleural effusion is noted. Increased right lower lobe opacity is noted compared to prior exam consistent with worsening pneumonia or edema. Stable Kerley B-lines are noted laterally in left lung base concerning for pulmonary edema. Bony thorax is intact.  IMPRESSION: Stable mild pulmonary edema seen in left lung base. Increased right lower lobe opacity is noted concerning for pneumonia or edema.   Electronically Signed   By: Roque Lias M.D.   On: 04/24/2014 08:12    Other results: EKG: sinus rhythm.  Assessment & Plan by Problem: Active Problems:   HCAP (healthcare-associated pneumonia)  HCAP versus Pulmonary Edema: Patient presented with increasing shortness of breath over 2 weeks with orthopnea, PND and DOE and at rest. On admission, patient  is afebrile at 98.1, hypertensive at 188/135, tachycardic at 97, RR 18, satting 100% on room air. On physical exam, patient has coarse breath sounds with diffuse inspiratory and expiratory wheezes and rhonchi, but no rales are auscultated. No lower extremity edema bilaterally. Labs showed no leukocytosis and hyperkalemia of 5.7. EKG showed sinus rhythm and CXR revealed stable mild pulmonary edema in left lower lung bases with increased right sided right lower lobe opacity which is concerning for pneumonia versus edema. Patient completed dialysis session fully yesterday. Patient could be having flash pulmonary edema from uncontrolled hypertension, however, these symptoms have been increasing over 2 weeks. Patient also could be volume overloaded, however, patient does not clinically appear this way on exam and has been complaint with HD. Pneumonia is also a possibility; however, patient is afebrile and without leukocytosis. We will treat for conditions above and assess improvement.  -Consulted Nephrology, appreciate recommendations -Cardiac Monitoring -Renal diet -Cefepime  -Vancomycin -Control BP -CBC/BMET tomorrow am -Daily weights -Repeat EKG tomorrow am -Repeat CXR 2 view tomorrow am -Arterial blood gas -Duonebs Q4H prn  -Echo  ESRD on HD TTS: Last completed HD yesterday. Patient is on Renvela at home. Patient will likely go for HD today. -Continue HD -Nephrology consulted, appreciate recommendations  Hyperkalemia: 5.7 on admission. No chest pain or arrhythmias. Going for HD today. No peaked TWs.  -HD -Repeat BMET tomorrow am -Repeat EKG tomorrow ma -Cardiac monitor  HTN: Patient is on lisinopril 20 mg daily, amlodipine 10 mg daily and Toprol XL 100 mg daily at home. Patient reports compliance. Hypertensive on admission at 188/135. Patient was diagnosed with hypertension in his teens. Strong family history, but secondary cause of hypertension is unclear. Patient used to be obese, but now  has a normal BMI without improvement of hypertension. -Amlodipine 10 mg daily -Toprol XL 100 mg daily -Lisinopril 20 mg daily -Labetalol 10 mg IV once   DVT/PE ppx: Heparin TID  Dispo: Disposition is deferred at this time, awaiting improvement of current medical problems. Anticipated discharge in approximately 1-2 day(s).   The patient does have a current PCP (Cecille Aver, MD) and does need an [facility] hospital follow-up appointment after discharge.  The patient does not have transportation limitations  that hinder transportation to clinic appointments.  Signed: Jill Alexanders, DO PGY-1 Internal Medicine Resident Pager # [phone] 04/24/2014 11:14 AM

## 2014-04-24 NOTE — ED Notes (Signed)
Pt ambulating to restroom; refused bedside commode.

## 2014-04-24 NOTE — Consult Note (Signed)
Renal Service Consult Note [facility]  Austin Hardy 04/24/2014 Austin Hardy D Requesting Physician:  Dr Heide Spark  Reason for Consult:  ESRD pt with SOB HPI: The patient is a 29 y.o. year-old with hx of HTN many years, ESRD d/t HTN on HD x 3 years. Moved from [place], gets HD now at [facility]  Reports SOB and orthopnea. CXR in ED +pulm edema.  Says he has N/V and anorexia, fatigue for some time, and wt loss and muscle loss, but vague on duration.  Says he was only getting HD 2 days per week when he moved here and recently was moved up to 3 days per week. Says he only runs 3 hr treatments, however [facility] has him at 4h sessions.   ROS  no fever, CP, or abd pain  no jt pain  no access issues  Past Medical History  Past Medical History  Diagnosis Date  . Hypertension   . Chronic kidney disease    Past Surgical History  Past Surgical History  Procedure Laterality Date  . Fracture surgery Left 2005    Ankle  . Av fistula placement Right March 2013   Family History  Family History  Problem Relation Age of Onset  . Diabetes Mother   . Hypertension Mother   . Diabetes Father   . Hypertension Father    Social History  reports that he has been smoking.  He has never used smokeless tobacco. He reports that he does not drink alcohol or use illicit drugs. Allergies No Known Allergies Home medications Prior to Admission medications   Medication Sig Start Date End Date Taking? Authorizing Provider  lisinopril (PRINIVIL,ZESTRIL) 20 MG tablet TAKE ONE TABLET BY MOUTH ONCE DAILY 04/10/13  Yes Richarda Overlie, MD  metoprolol (LOPRESSOR) 100 MG tablet Take 1 tablet (100 mg total) by mouth 2 (two) times daily. Patient taking differently: Take 100 mg by mouth daily.  09/11/12  Yes Richarda Overlie, MD  sevelamer carbonate (RENVELA) 800 MG tablet Take 2,400 mg by mouth 3 (three) times daily with meals. Three tab.per meal   Yes Historical Provider, MD  Vitamins A & D (VITAMIN A & D)  ointment Apply 1 application topically daily.   Yes Historical Provider, MD  cetirizine-pseudoephedrine (ZYRTEC-D) 5-120 MG per tablet Take 1 tablet by mouth 2 (two) times daily. Patient not taking: Reported on 03/02/2014 03/01/12   Christin Fudge Moreno-Coll, MD  clindamycin (CLEOCIN) 150 MG capsule Take 3 capsules (450 mg total) by mouth 4 (four) times daily. X 7 days Patient not taking: Reported on 04/20/2014 03/02/14   Erskine Emery, MD  erythromycin ophthalmic ointment Place a 1/2 inch ribbon of ointment into the lower eyelid at night time daily for 5 days Patient not taking: Reported on 03/02/2014 03/01/12   Christin Fudge Moreno-Coll, MD  ketotifen (ZADITOR) 0.025 % ophthalmic solution Place 1 drop into the right eye 2 (two) times daily. Patient not taking: Reported on 03/02/2014 03/01/12   Christin Fudge Moreno-Coll, MD  predniSONE (DELTASONE) 20 MG tablet 2 tabs po daily for 5 days Patient not taking: Reported on 03/02/2014 03/01/12   Sharin Grave, MD   Liver Function Tests No results for input(s): AST, ALT, ALKPHOS, BILITOT, PROT, ALBUMIN in the last 168 hours. No results for input(s): LIPASE, AMYLASE in the last 168 hours. CBC  Recent Labs Lab 04/20/14 0628 04/24/14 0746  WBC 5.0 6.8  NEUTROABS  --  4.7  HGB 12.0* 13.6  HCT 36.6* 39.5  MCV 101.4* 88.0  PLT 193  254   Basic Metabolic Panel  Recent Labs Lab 04/20/14 0628 04/24/14 0746  NA 137 135  K 6.9* 5.7*  CL 92* 92*  CO2 23 22  GLUCOSE 100* 120*  BUN 102* 104*  CREATININE 22.17* 22.01*  CALCIUM 8.8 9.2    Filed Vitals:   04/24/14 1000 04/24/14 1030 04/24/14 1100 04/24/14 1128  BP: 179/128 192/144 187/142   Pulse: 102 103 100   Temp:      TempSrc:      Resp: 20 23 23    SpO2: 95% 98% 100% 100%   Exam Alert, no distress No rash, cyanosis or gangrene Sclera anicteric, throat clear +JVD Bibasilar insp rales 1/2 up, no wheezing RRR no MRG Abd soft, NTND, no ascites or tenderness Trace edema LLE pretib, no edema R R arm AVF  patent Neuro is nf, Ox 3, no asterixis  HD: [place] [facility] 4h   450/800   2/2.0 Bath  80kg   Heparin 6000  RFA AVF Calcitriol 1.0 ug TIW   Assessment: 1. Pulm edema / SOB - most likely vol overload, but need ECHO as well. 2. HTN longstanding - BP's very high, get vol down, cont BP meds 3. ESRD on HD 4. 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    Vinson Moselle MD (pgr) [phone]    [phone] 04/24/2014, 11:31 AM

## 2014-04-24 NOTE — ED Notes (Signed)
Admitting MD at bedside.

## 2014-04-24 NOTE — Discharge Summary (Addendum)
Name: Austin Hardy MRN: [id number] DOB: [birth year] 29 y.o. PCP: Austin Aver, MD  Date of Admission: 04/24/2014  7:23 AM Date of Discharge: 04/27/2014 Attending Physician: Austin Lagos, MD  Discharge Diagnosis:  Principal Problem:   Acute pulmonary edema Active Problems:   Volume overload   ESRD (end stage renal disease)   Essential hypertension   Shortness of breath   Heart failure with reduced ejection fraction   Malnutrition of moderate degree  Discharge Medications:   Medication List    STOP taking these medications        clindamycin 150 MG capsule  Commonly known as:  CLEOCIN     lisinopril 20 MG tablet  Commonly known as:  PRINIVIL,ZESTRIL     metoprolol succinate 100 MG 24 hr tablet  Commonly known as:  TOPROL-XL     predniSONE 20 MG tablet  Commonly known as:  DELTASONE      TAKE these medications        carvedilol 25 MG tablet  Commonly known as:  COREG  Take 1 tablet (25 mg total) by mouth 2 (two) times daily with a meal.     cetirizine-pseudoephedrine 5-120 MG per tablet  Commonly known as:  ZYRTEC-D  Take 1 tablet by mouth 2 (two) times daily.     erythromycin ophthalmic ointment  Place a 1/2 inch ribbon of ointment into the lower eyelid at night time daily for 5 days     hydrALAZINE 25 MG tablet  Commonly known as:  APRESOLINE  Take 1 tablet (25 mg total) by mouth 3 (three) times daily.     isosorbide mononitrate 30 MG 24 hr tablet  Commonly known as:  IMDUR  Take 2 tablets (60 mg total) by mouth daily.     ketotifen 0.025 % ophthalmic solution  Commonly known as:  ZADITOR  Place 1 drop into the right eye 2 (two) times daily.     metroNIDAZOLE 500 MG tablet  Commonly known as:  FLAGYL  Take 1 tablet (500 mg total) by mouth every 8 (eight) hours.     RENVELA 800 MG tablet  Generic drug:  sevelamer carbonate  Take 2,400 mg by mouth 3 (three) times daily with meals. Three tab.per meal     vitamin A & D ointment  Apply  1 application topically daily.        Disposition and follow-up:   Austin Hardy was discharged from [facility] in Good condition.  At the hospital follow up visit please address:  1.  Hypertension: [name] admitted noncompliance with his amlodipine (and is not agreeable to taking it in the future). A new anti-hypertensive regimen was initiated with input from renal and cardiology; it includes imdur 30 mg daily, hydralazine 25 mg TID, carvedilol 25 mg BID. Please assess BP and the patient's compliance with these medications; he would like to control his BP, but will be more compliant if his wishes are taken into account.  Patient initially was thought to possibly have pneumonia but improved with volume management was not consistent with pneumonia.  Antibiotics were therefore stopped.    Cardiology is setting up stress test outpatient  2.  Labs / imaging needed at time of follow-up:   3.  Pending labs/ test needing follow-up:   Follow-up Appointments:   Discharge Instructions:   Consultations: Treatment Team:  Austin Krabbe, MD  Procedures Performed:  Dg Chest 2 View  04/24/2014   CLINICAL DATA:  Shortness of  breath.  EXAM: CHEST  2 VIEW  COMPARISON:  April 20, 2014.  FINDINGS: Stable cardiomegaly. No pneumothorax or significant pleural effusion is noted. Increased right lower lobe opacity is noted compared to prior exam consistent with worsening pneumonia or edema. Stable Kerley B-lines are noted laterally in left lung base concerning for pulmonary edema. Bony thorax is intact.  IMPRESSION: Stable mild pulmonary edema seen in left lung base. Increased right lower lobe opacity is noted concerning for pneumonia or edema.   Electronically Signed   By: Austin Hardy M.D.   On: 04/24/2014 08:12   Dg Chest 2 View (if Patient Has Fever And/or Copd)  04/20/2014   CLINICAL DATA:  Shortness of breath.  EXAM: CHEST  2 VIEW  COMPARISON:  03/02/2014.  FINDINGS:  Mediastinum and hilar structures are normal. Cardiomegaly with bilateral pulmonary interstitial prominence including Kerley B-lines. Findings consistent with congestive heart failure. There is a right lower lobe alveolar infiltrate. This could represent focal pulmonary edema or pneumonia. No pleural effusion or pneumothorax.  IMPRESSION: 1. Cardiomegaly with bilateral pulmonary interstitial prominence including Kerley B-lines. These findings are consistent with congestive heart failure. 2. Right lower low alveolar infiltrate. This could represent focal pulmonary edema and/or pneumonia.   Electronically Signed   By: Austin Hardy   On: 04/20/2014 07:10   Dg Chest Port 1v Same Day  04/24/2014   CLINICAL DATA:  29 year old male with a history of pulmonary edema. Hypertension and chronic kidney disease.  EXAM: PORTABLE CHEST - 1 VIEW SAME DAY  COMPARISON:  Chest x-ray 04/24/2014, 04/20/2014, 03/02/2014, 03/11/2011  FINDINGS: Cardiomediastinal silhouette unchanged in size and contour, though partially obscured by overlying lung and pleural disease.  Compared to the prior, there is increasing opacity at the bilateral lung bases partially obscuring the hemidiaphragms and the heart borders. Mixture of interstitial and airspace disease, with interlobular septal thickening.  No displaced fracture.  IMPRESSION: Worsening mixed airspace and interstitial disease compared to the recent chest x-ray, concerning for worsening edema, although infection cannot be excluded.  Signed,  Austin Hardy  [facility]  [facility]   Electronically Signed   By: Austin Hardy D.O.   On: 04/24/2014 12:16    2D Echo:   Admission HPI: [name] is a 29 yo male with PMHx of uncontrolled HTN and ESRD on HD TTS who presents for worsening shortness of breath. Patient states he has been dealing with increasing shortness of breath for the past 2 weeks. Shortness of breath is worse with  exertion, but occurs even at rest. He cannot lay flat and must sleep sitting straight up. However, patient has not been able to sleep due to shortness of breath and has been watching movies for the past 3 nights. His shortness of breath is associated PND, orthopnea, DOE, productive cough with clear sputum and wheezing. He also admits to fever and chills. He denies headache, chest pain, nausea, vomiting or pain. Patient was dialyzed for a complete session yesterday. He feels that he is at his dry weight.   Patient has a history of uncontrolled hypertension diagnosed in his early teens. Patient had a kidney biopsy when he was young which was normal. Patient developed CKD and ESRD secondary to uncontrolled HTN and has been on dialysis for 3 years- TTS schedule. Patient and mother are unaware if he was ever diagnosed with a secondary cause of hypertension. Patient recently moved here about 1 year ago. He follows with nephrology but does not  have a PCP. He has been compliant with HD and medications. Previous work up was done at [facility] in [place], [place] and [facility] in [place], [place] with Dr. Chevis Pretty.   Of note, patient used to weigh 357 and currently weighs 179 pounds. Patient has poor appetite and believes he has lost most of the weight from increased activity. He used to play semi-pro football.  Hospital Course by problem list: Active Problems:   HCAP (healthcare-associated pneumonia)   Volume overload   Acute pulmonary edema   ESRD (end stage renal disease)   Essential hypertension   Shortness of breath     Discharge Vitals:   BP 190/128 mmHg  Pulse 107  Temp(Src) 97.5 F (36.4 C) (Oral)  Resp 26  Ht  (1.803 m)  Wt 87.8 kg (193 lb 9 oz)  BMI 27.01 kg/m2  SpO2 100%  Discharge Labs:  Results for orders placed or performed during the hospital encounter of 04/24/14 (from the past 24 hour(s))  Basic metabolic panel     Status: Abnormal   Collection Time:  04/24/14  7:46 AM  Result Value Ref Range   Sodium 135 135 - 145 mmol/L   Potassium 5.7 (H) 3.5 - 5.1 mmol/L   Chloride 92 (L) 96 - 112 mmol/L   CO2 22 19 - 32 mmol/L   Glucose, Bld 120 (H) 70 - 99 mg/dL   BUN 161 (H) 6 - 23 mg/dL   Creatinine, Ser 09.60 (H) 0.50 - 1.35 mg/dL   Calcium 9.2 8.4 - 45.4 mg/dL   GFR calc non Af Amer 2 (L) >90 mL/min   GFR calc Af Amer 3 (L) >90 mL/min   Anion gap 21 (H) 5 - 15  CBC with Differential     Status: None   Collection Time: 04/24/14  7:46 AM  Result Value Ref Range   WBC 6.8 4.0 - 10.5 K/uL   RBC 4.49 4.22 - 5.81 MIL/uL   Hemoglobin 13.6 13.0 - 17.0 g/dL   HCT 09.8 11.9 - 14.7 %   MCV 88.0 78.0 - 100.0 fL   MCH 30.3 26.0 - 34.0 pg   MCHC 34.4 30.0 - 36.0 g/dL   RDW 82.9 56.2 - 13.0 %   Platelets 254 150 - 400 K/uL   Neutrophils Relative % 70 43 - 77 %   Neutro Abs 4.7 1.7 - 7.7 K/uL   Lymphocytes Relative 18 12 - 46 %   Lymphs Abs 1.2 0.7 - 4.0 K/uL   Monocytes Relative 10 3 - 12 %   Monocytes Absolute 0.7 0.1 - 1.0 K/uL   Eosinophils Relative 2 0 - 5 %   Eosinophils Absolute 0.2 0.0 - 0.7 K/uL   Basophils Relative 0 0 - 1 %   Basophils Absolute 0.0 0.0 - 0.1 K/uL  Brain natriuretic peptide     Status: Abnormal   Collection Time: 04/24/14  7:46 AM  Result Value Ref Range   B Natriuretic Peptide 3984.2 (H) 0.0 - 100.0 pg/mL  I-stat troponin, ED     Status: None   Collection Time: 04/24/14  8:09 AM  Result Value Ref Range   Troponin i, poc 0.07 0.00 - 0.08 ng/mL   Comment 3          I-Stat arterial blood gas, ED     Status: Abnormal   Collection Time: 04/24/14 11:15 AM  Result Value Ref Range   pH, Arterial 7.340 (L) 7.350 - 7.450  pCO2 arterial 34.9 (L) 35.0 - 45.0 mmHg   pO2, Arterial 114.0 (H) 80.0 - 100.0 mmHg   Bicarbonate 18.8 (L) 20.0 - 24.0 mEq/L   TCO2 20 0 - 100 mmol/L   O2 Saturation 98.0 %   Acid-base deficit 6.0 (H) 0.0 - 2.0 mmol/L   Collection site RADIAL, ALLEN'S TEST ACCEPTABLE    Drawn by Operator     Sample type ARTERIAL   MRSA PCR Screening     Status: None   Collection Time: 04/24/14  2:17 PM  Result Value Ref Range   MRSA by PCR NEGATIVE NEGATIVE    Signed: Dionne Ano, MD 04/24/2014, 7:38 PM

## 2014-04-24 NOTE — ED Notes (Signed)
RT to come draw gas.

## 2014-04-24 NOTE — ED Notes (Signed)
Pt stated that he was having trouble breathing. O2 sats were 93% on room air.

## 2014-04-24 NOTE — Progress Notes (Addendum)
ANTIBIOTIC CONSULT NOTE - INITIAL  Pharmacy Consult for Vancomycin + Cefepime  Indication: pneumonia  No Known Allergies  Patient Measurements:    Ht: 71 inches Wt: 81.2 kg  Vital Signs: Temp: 98.1 F (36.7 C) (01/30 0739) Temp Source: Oral (01/30 0739) BP: 188/135 mmHg (01/30 0739) Pulse Rate: 97 (01/30 0739) Intake/Output from previous day:   Intake/Output from this shift:    Labs:  Recent Labs  04/24/14 0746  WBC 6.8  HGB 13.6  PLT 254  CREATININE 22.01*   Estimated Creatinine Clearance: 5.3 mL/min (by C-G formula based on Cr of 22.01). No results for input(s): VANCOTROUGH, VANCOPEAK, VANCORANDOM, GENTTROUGH, GENTPEAK, GENTRANDOM, TOBRATROUGH, TOBRAPEAK, TOBRARND, AMIKACINPEAK, AMIKACINTROU, AMIKACIN in the last 72 hours.   Microbiology: No results found for this or any previous visit (from the past 720 hour(s)).  Medical History: Past Medical History  Diagnosis Date  . Hypertension   . Chronic kidney disease     Assessment: 23 YOM with ESRD (schedule appears to be TTS) who presented to the [facility] on 1/30 with SOB x 2 weeks. Pharmacy consulted to start Vancomycin + Cefepime for r/o HCAP. Afebrile, WBC wnl - SOB may be related to fluid overload - pending renal consult this admit for HD.  Goal of Therapy:  Pre-HD Vancomycin level of 15-25 mcg/ml  Plan:  1. Vancomycin 1750 mg IV x 1 dose this AM to load 2. Cefepime 2g IV x 1 dose this AM to load 3. Will follow-up with renal plans for possible HD today 4. Will continue to follow HD schedule/duration, culture results, LOT, and antibiotic de-escalation plans   Georgina Pillion, PharmD, BCPS Clinical Pharmacist Pager: [phone] 04/24/2014 8:47 AM    ---------------------------------------------------------------------------------------------------------------- Addendum:  Renal has rounded on this patient and plans are to dialyze today. A Vancomycin LD was given this AM at ~1000, will enter a maintenance dose  for HD today and every TTS assuming the patient stays on his outpatient HD schedule.   Plan 1. Vancomycin 1g post HD today then 1g post HD-T/Th/Sat 2. Will monitor weights closely and reduce dose to 750 mg with HD if weight falls <80 kg 3. Cefepime 1g IV x 1 dose this evening followed by 2g post HD-T/Th/Sat 3. Will continue to follow HD schedule/duration, culture results, LOT, and antibiotic de-escalation plans   Georgina Pillion, PharmD, BCPS Clinical Pharmacist Pager: [phone] 04/24/2014 1:01 PM

## 2014-04-25 ENCOUNTER — Inpatient Hospital Stay (HOSPITAL_COMMUNITY): Payer: Medicaid Other

## 2014-04-25 DIAGNOSIS — Z992 Dependence on renal dialysis: Secondary | ICD-10-CM

## 2014-04-25 DIAGNOSIS — R197 Diarrhea, unspecified: Secondary | ICD-10-CM

## 2014-04-25 LAB — CBC
HCT: 40 % (ref 39.0–52.0)
HEMOGLOBIN: 13.4 g/dL (ref 13.0–17.0)
MCH: 32.9 pg (ref 26.0–34.0)
MCHC: 33.5 g/dL (ref 30.0–36.0)
MCV: 98.3 fL (ref 78.0–100.0)
PLATELETS: 182 10*3/uL (ref 150–400)
RBC: 4.07 MIL/uL — AB (ref 4.22–5.81)
RDW: 14.2 % (ref 11.5–15.5)
WBC: 5.5 10*3/uL (ref 4.0–10.5)

## 2014-04-25 LAB — BASIC METABOLIC PANEL
Anion gap: 14 (ref 5–15)
BUN: 51 mg/dL — ABNORMAL HIGH (ref 6–23)
CALCIUM: 8.9 mg/dL (ref 8.4–10.5)
CO2: 27 mmol/L (ref 19–32)
Chloride: 96 mmol/L (ref 96–112)
Creatinine, Ser: 14.83 mg/dL — ABNORMAL HIGH (ref 0.50–1.35)
GFR calc non Af Amer: 4 mL/min — ABNORMAL LOW (ref >90)
GFR, EST AFRICAN AMERICAN: 4 mL/min — AB (ref >90)
GLUCOSE: 96 mg/dL (ref 70–99)
Potassium: 4.3 mmol/L (ref 3.5–5.1)
Sodium: 137 mmol/L (ref 135–145)

## 2014-04-25 LAB — CLOSTRIDIUM DIFFICILE BY PCR: CDIFFPCR: POSITIVE — AB

## 2014-04-25 LAB — HEPATITIS B SURFACE ANTIGEN: Hepatitis B Surface Ag: NEGATIVE

## 2014-04-25 MED ORDER — LEVOFLOXACIN 750 MG PO TABS
750.0000 mg | ORAL_TABLET | Freq: Once | ORAL | Status: AC
  Administered 2014-04-25: 750 mg via ORAL
  Filled 2014-04-25: qty 1

## 2014-04-25 MED ORDER — METRONIDAZOLE 500 MG PO TABS
500.0000 mg | ORAL_TABLET | Freq: Three times a day (TID) | ORAL | Status: DC
  Administered 2014-04-25 – 2014-04-27 (×6): 500 mg via ORAL
  Filled 2014-04-25 (×9): qty 1

## 2014-04-25 MED ORDER — LABETALOL HCL 200 MG PO TABS: 200.0000 mg | ORAL_TABLET | Freq: Two times a day (BID) | ORAL | Status: DC

## 2014-04-25 MED ORDER — LABETALOL HCL 300 MG PO TABS
300.0000 mg | ORAL_TABLET | Freq: Two times a day (BID) | ORAL | Status: DC
  Administered 2014-04-25 (×2): 300 mg via ORAL
  Filled 2014-04-25 (×4): qty 1

## 2014-04-25 MED ORDER — AMLODIPINE BESYLATE 10 MG PO TABS
10.0000 mg | ORAL_TABLET | Freq: Every evening | ORAL | Status: DC
  Administered 2014-04-25 – 2014-04-26 (×2): 10 mg via ORAL
  Filled 2014-04-25 (×3): qty 1

## 2014-04-25 MED ORDER — LEVOFLOXACIN 500 MG PO TABS: 500.0000 mg | ORAL_TABLET | ORAL | Status: DC

## 2014-04-25 NOTE — Progress Notes (Signed)
ANTIBIOTIC CONSULT NOTE - FOLLOW UP  Pharmacy Consult for Levofloxacin Indication: HCAP  No Known Allergies  Patient Measurements: Height:  (180.3 cm) Weight: 175 lb 7.8 oz (79.6 kg) IBW/kg (Calculated) : 75.3  Vital Signs: Temp: 98.2 F (36.8 C) (01/31 0632) Temp Source: Oral (01/31 7829) BP: 168/105 mmHg (01/31 5621) Pulse Rate: 104 (01/31 3086) Intake/Output from previous day: 01/30 0701 - 01/31 0700 In: 3 [I.V.:3] Out: 4316  Intake/Output from this shift:    Labs:  Recent Labs  04/24/14 0746 04/25/14 0440  WBC 6.8 5.5  HGB 13.6 13.4  PLT 254 182  CREATININE 22.01* 14.83*   Estimated Creatinine Clearance: 7.8 mL/min (by C-G formula based on Cr of 14.83). No results for input(s): VANCOTROUGH, VANCOPEAK, VANCORANDOM, GENTTROUGH, GENTPEAK, GENTRANDOM, TOBRATROUGH, TOBRAPEAK, TOBRARND, AMIKACINPEAK, AMIKACINTROU, AMIKACIN in the last 72 hours.   Microbiology: Recent Results (from the past 720 hour(s))  MRSA PCR Screening     Status: None   Collection Time: 04/24/14  2:17 PM  Result Value Ref Range Status   MRSA by PCR NEGATIVE NEGATIVE Final    Comment:        The GeneXpert MRSA Assay (FDA approved for NASAL specimens only), is one component of a comprehensive MRSA colonization surveillance program. It is not intended to diagnose MRSA infection nor to guide or monitor treatment for MRSA infections.     Anti-infectives    Start     Dose/Rate Route Frequency Ordered Stop   04/27/14 1800  ceFEPIme (MAXIPIME) 2 g in dextrose 5 % 50 mL IVPB  Status:  Discontinued     2 g100 mL/hr over 30 Minutes Intravenous Every T-Th-Sa (1800) 04/24/14 1421 04/25/14 1046   04/27/14 1200  ceFEPIme (MAXIPIME) 2 g in dextrose 5 % 50 mL IVPB  Status:  Discontinued     2 g100 mL/hr over 30 Minutes Intravenous Every T-Th-Sa (Hemodialysis) 04/24/14 1420 04/24/14 1421   04/24/14 2200  ceFEPIme (MAXIPIME) 1 g in dextrose 5 % 50 mL IVPB     1 g100 mL/hr over 30 Minutes  Intravenous  Once 04/24/14 1420 04/24/14 2137   04/24/14 1500  vancomycin (VANCOCIN) IVPB 1000 mg/200 mL premix  Status:  Discontinued     1,000 mg200 mL/hr over 60 Minutes Intravenous Every T-Th-Sa (Hemodialysis) 04/24/14 1304 04/25/14 1046   04/24/14 1300  vancomycin (VANCOCIN) IVPB 1000 mg/200 mL premix  Status:  Discontinued     1,000 mg200 mL/hr over 60 Minutes Intravenous Every T-Th-Sa (Hemodialysis) 04/24/14 1259 04/24/14 1303   04/24/14 0900  vancomycin (VANCOCIN) 1,750 mg in sodium chloride 0.9 % 500 mL IVPB     1,750 mg250 mL/hr over 120 Minutes Intravenous NOW 04/24/14 0847 04/24/14 1227   04/24/14 0845  ceFEPIme (MAXIPIME) 1 g in dextrose 5 % 50 mL IVPB  Status:  Discontinued     1 g100 mL/hr over 30 Minutes Intravenous  Once 04/24/14 0830 04/24/14 0842   04/24/14 0845  ceFEPIme (MAXIPIME) 2 g in dextrose 5 % 50 mL IVPB     2 g100 mL/hr over 30 Minutes Intravenous  Once 04/24/14 0842 04/24/14 1000      Assessment:  28 YOM with ESRD (schedule appears to be TTS) who presented [facility] the [facility] on 1/30 with SOB x 2 weeks. Pharmacy consulted to dose Levofloxacin for HCAP.   PMH: uncontrolled HTN and ESRD on HD  Infectious Disease: HCAP + Pulmonary edema - WBC wnl, afeb.    1/30 Cefepime >> 1/31 1/30 Vanc >> 1/31  1/31 C.diff >>  Nephrology: ESRD HD TTS - K wnl resolved, Ca wnl. KLast HD 1/30 tolerated well, dialyzed off 5 kg.  Renvela.  Plan:  - Levofloxacin 750 mg PO x 1 - Levofloxacin 500 mg PO Q48H (Total 5 days) -  Will continue to follow HD schedule/duration, culture results, and clinical efficacy  Red Christians, Pharm. D. Clinical Pharmacy Resident Pager: [phone] Ph: [phone] 04/25/2014 11:36 AM

## 2014-04-25 NOTE — Progress Notes (Signed)
  [facility] Progress Note   Subjective: 5 kg off, cramped but he kept UF on, says he doesn't like to turn off UF for cramping  Filed Vitals:   04/24/14 1900 04/24/14 1930 04/24/14 2033 04/25/14 0632  BP: 195/137 198/130 176/122 168/105  Pulse: 109 104 105 104  Temp:   99 F (37.2 C) 98.2 F (36.8 C)  TempSrc:   Oral Oral  Resp: 22 22 20 16   Height:      Weight:      SpO2:   100% 100%   Exam: Alert, no distress No jvd Faint rales R base, o/w clear RRR no MRG Abd soft, NTND, no ascites or tenderness Trace edema LLE pretib, no edema R R arm AVF patent Neuro is nf, Ox 3, no asterixis  HD: [facility] 4h 450/800 2/2.0 Bath 80kg Heparin 6000 RFA AVF Calcitriol 1.0 ug TIW   Assessment: 1. Pulm edema / SOB - improved , 5kg off yest. 2. HTN longstanding - BP's very high. He says it has been like this since he was "16".  Never been on minoxidil.  I think he still has extra volume although is at dry wt.  CXR f/u showing improved edema. He does not need abx for PNA, this is all fluid / BP related.  He responded well to IV labetalol, has been on MTP for a long time. Will try switching MTP to labetalol and titrate up, continue to lower dry wt in hospital and try to get control of his BP. Leave minoxidil as last chance if volume status and po meds not correcting BP.    3. ESRD on HD 4. 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 Extra HD tomorrow. Switch MTP to labetalol, give all po meds this am.     Vinson Moselle MD  pager [phone]    cell [phone]  04/25/2014, 6:44 AM     Recent Labs Lab 04/20/14 0628 04/24/14 0746 04/25/14 0440  NA 137 135 137  K 6.9* 5.7* 4.3  CL 92* 92* 96  CO2 23 22 27   GLUCOSE 100* 120* 96  BUN 102* 104* 51*  CREATININE 22.17* 22.01* 14.83*  CALCIUM 8.8 9.2 8.9   No results for input(s): AST, ALT, ALKPHOS, BILITOT, PROT, ALBUMIN in the last 168 hours.  Recent Labs Lab 04/20/14 0628 04/24/14 0746  WBC 5.0 6.8  NEUTROABS  --  4.7   HGB 12.0* 13.6  HCT 36.6* 39.5  MCV 101.4* 88.0  PLT 193 254   . amLODipine  10 mg Oral Daily  . [START ON 04/27/2014] ceFEPime (MAXIPIME) IV  2 g Intravenous Q T,Th,Sat-1800  . heparin  5,000 Units Subcutaneous 3 times per day  . lisinopril  20 mg Oral Daily  . metoprolol succinate  100 mg Oral Daily  . sevelamer carbonate  2,400 mg Oral TID WC  . sodium chloride  3 mL Intravenous Q12H  . vancomycin  1,000 mg Intravenous Q T,Th,Sa-HD     hydrALAZINE, ipratropium-albuterol

## 2014-04-25 NOTE — Progress Notes (Signed)
Utilization review completed.  

## 2014-04-25 NOTE — Progress Notes (Signed)
CRITICAL VALUE ALERT  Critical value received:  Positive c-diff  Date of notification:  04/25/14  Time of notification:  1506  Critical value read back: yes  Nurse who received alert:  Sharen Heck  MD notified (1st page):    Time of first page:  1531  MD notified (2nd page):  Time of second page:  Responding MD:    Time MD responded:  [phone]

## 2014-04-25 NOTE — Progress Notes (Signed)
Subjective:  Patient was seen and examined this morning. Patient feels much better, sitting up in bed eating breakfast. He states shortness of breath has completely resolved. Patient admits to diarrhea, but this is a chronic issue for him and he attributes it to his hemodialysis medications. He denies fever or chills, cough or wheeze.   Objective: Vital signs in last 24 hours: Filed Vitals:   04/24/14 1900 04/24/14 1930 04/24/14 2033 04/25/14 0632  BP: 195/137 198/130 176/122 168/105  Pulse: 109 104 105 104  Temp:   99 F (37.2 C) 98.2 F (36.8 C)  TempSrc:   Oral Oral  Resp: Height:     (1.803 m)  Weight:    79.6 kg (175 lb 7.8 oz)  SpO2:   100% 100%   Weight change:   Intake/Output Summary (Last 24 hours) at 04/25/14 1048 Last data filed at 04/24/14 2108  Gross per 24 hour  Intake      3 ml  Output   4316 ml  Net  -4313 ml   General: Vital signs reviewed. Patient is well-developed and well-nourished, in no acute distress and cooperative with exam.  Cardiovascular: Tachycardic, regular rhythm, S1 normal, S2 normal, no murmurs, gallops, or rubs. Pulmonary/Chest: Clear to auscultation bilaterally, no wheezing, rhonchi or rales. Abdominal: Soft, non-tender, non-distended, BS + Extremities: No lower extremity edema bilaterally. R upper extremity AVF with good thrill and bruit.  Neurological: A&O x3 Skin: Warm, dry and intact. No rashes or erythema. Psychiatric: Normal mood and affect.  Lab Results: Basic Metabolic Panel:  Recent Labs Lab 04/24/14 0746 04/25/14 0440  NA 135 137  K 5.7* 4.3  CL 92* 96  CO2 22 27  GLUCOSE 120* 96  BUN 104* 51*  CREATININE 22.01* 14.83*  CALCIUM 9.2 8.9   CBC:  Recent Labs Lab 04/24/14 0746 04/25/14 0440  WBC 6.8 5.5  NEUTROABS 4.7  --   HGB 13.6 13.4  HCT 39.5 40.0  MCV 88.0 98.3  PLT 254 182   CBG:  Recent Labs Lab 04/20/14 0719 04/20/14 0825  GLUCAP 59* 136*   Micro Results: Recent Results  (from the past 240 hour(s))  MRSA PCR Screening     Status: None   Collection Time: 04/24/14  2:17 PM  Result Value Ref Range Status   MRSA by PCR NEGATIVE NEGATIVE Final    Comment:        The GeneXpert MRSA Assay (FDA approved for NASAL specimens only), is one component of a comprehensive MRSA colonization surveillance program. It is not intended to diagnose MRSA infection nor to guide or monitor treatment for MRSA infections.    Studies/Results: X-ray Chest Pa And Lateral  04/25/2014   CLINICAL DATA:  29 year old male with shortness of breath and hypertension.  EXAM: CHEST  2 VIEW  COMPARISON:  Chest x-ray 04/24/2014.  FINDINGS: Significantly improved aeration compared to yesterday's examination. No residual signs of pulmonary edema. However, there continues to be and ill-defined area of interstitial prominence and peribronchial cuffing throughout the right mid to lower lung involving portions of the right middle lobe and right lower lobe, likely to reflect resolving bronchopneumonia. Pulmonary vasculature is now normal. Heart size remains borderline enlarged. Upper mediastinal contours are within normal limits.  IMPRESSION: 1. Significantly improved aeration with resolved pulmonary edema but persistent evidence of resolving bronchopneumonia in the right middle and lower lobes.   Electronically Signed   By: Trudie Reed M.D.   On: 04/25/2014 08:56  Dg Chest 2 View  04/24/2014   CLINICAL DATA:  Shortness of breath.  EXAM: CHEST  2 VIEW  COMPARISON:  April 20, 2014.  FINDINGS: Stable cardiomegaly. No pneumothorax or significant pleural effusion is noted. Increased right lower lobe opacity is noted compared to prior exam consistent with worsening pneumonia or edema. Stable Kerley B-lines are noted laterally in left lung base concerning for pulmonary edema. Bony thorax is intact.  IMPRESSION: Stable mild pulmonary edema seen in left lung base. Increased right lower lobe opacity is noted  concerning for pneumonia or edema.   Electronically Signed   By: Roque Lias M.D.   On: 04/24/2014 08:12   Dg Chest Port 1v Same Day  04/24/2014   CLINICAL DATA:  29 year old male with a history of pulmonary edema. Hypertension and chronic kidney disease.  EXAM: PORTABLE CHEST - 1 VIEW SAME DAY  COMPARISON:  Chest x-ray 04/24/2014, 04/20/2014, 03/02/2014, 03/11/2011  FINDINGS: Cardiomediastinal silhouette unchanged in size and contour, though partially obscured by overlying lung and pleural disease.  Compared to the prior, there is increasing opacity at the bilateral lung bases partially obscuring the hemidiaphragms and the heart borders. Mixture of interstitial and airspace disease, with interlobular septal thickening.  No displaced fracture.  IMPRESSION: Worsening mixed airspace and interstitial disease compared to the recent chest x-ray, concerning for worsening edema, although infection cannot be excluded.  Signed,  Yvone Neu. Loreta Ave, DO  [facility]  [facility]   Electronically Signed   By: Gilmer Mor D.O.   On: 04/24/2014 12:16   Medications:  I have reviewed the patient's current medications. Prior to Admission:  Prescriptions prior to admission  Medication Sig Dispense Refill Last Dose  . lisinopril (PRINIVIL,ZESTRIL) 20 MG tablet TAKE ONE TABLET BY MOUTH ONCE DAILY 60 tablet 0 04/23/2014 at Unknown time  . metoprolol succinate (TOPROL-XL) 100 MG 24 hr tablet Take 100 mg by mouth daily. Take with or immediately following a meal.   04/24/2014 at 0600  . sevelamer carbonate (RENVELA) 800 MG tablet Take 2,400 mg by mouth 3 (three) times daily with meals. Three tab.per meal   04/23/2014 at Unknown time  . Vitamins A & D (VITAMIN A & D) ointment Apply 1 application topically daily.   04/23/2014 at Unknown time  . cetirizine-pseudoephedrine (ZYRTEC-D) 5-120 MG per tablet Take 1 tablet by mouth 2 (two) times daily. (Patient not taking: Reported on  03/02/2014) 30 tablet 0 Not Taking at Unknown time  . clindamycin (CLEOCIN) 150 MG capsule Take 3 capsules (450 mg total) by mouth 4 (four) times daily. X 7 days (Patient not taking: Reported on 04/20/2014) 60 capsule 0 Completed Course at Unknown time  . erythromycin ophthalmic ointment Place a 1/2 inch ribbon of ointment into the lower eyelid at night time daily for 5 days (Patient not taking: Reported on 03/02/2014) 1 g 0 Not Taking at Unknown time  . ketotifen (ZADITOR) 0.025 % ophthalmic solution Place 1 drop into the right eye 2 (two) times daily. (Patient not taking: Reported on 03/02/2014) 5 mL 0 Not Taking at Unknown time  . predniSONE (DELTASONE) 20 MG tablet 2 tabs po daily for 5 days (Patient not taking: Reported on 03/02/2014) 10 tablet 0 Not Taking at Unknown time   Scheduled Meds: . amLODipine  10 mg Oral QPM  . heparin  5,000 Units Subcutaneous 3 times per day  . labetalol  300 mg Oral BID  . lisinopril  20 mg Oral Daily  . sevelamer  carbonate  2,400 mg Oral TID WC  . sodium chloride  3 mL Intravenous Q12H   Continuous Infusions:  PRN Meds:.hydrALAZINE, ipratropium-albuterol Assessment/Plan: Active Problems:   HCAP (healthcare-associated pneumonia)   Volume overload   Acute pulmonary edema   ESRD (end stage renal disease)   Essential hypertension   Shortness of breath  Pulmonary Edema: Shortness of breath has resolved, patient satting 100% on room air. Patient was taken to HD yesterday and had 5 kg off. CXR showed significantly improved aeration with resolved pulmonary edema but persistent evidence of resolving bronchopneumonia in the right middle and lower lobes. His shortness of breath was likely secondary to uncontrolled hypertension, volume overload and pulmonary edema. Dr. Arlean Hopping, Nephrology, is following and recommends extra HD session tomorrow, discontinuing antibiotics (as symptoms are volume related), switching metoprolol to labetalol and continuing home po medications.  Minoxidil would be a last resort if HD and BP meds do not correct BP. We will check an echocardiogram today to assess cardiac status from long standing hypertension.  -Nephrology following, appreciate recommendations -Cardiac Monitoring -Renal diet -Discontinue Cefepime/Vancomycin -Levaquin po per pharmacy -Control BP: Labetalol 300 mg BID, Amlodipine 10 mg daily, Lisinopril 20 mg daily, Hydralazine 10 mg IV Q6H prn >200/110 -CBC/BMET tomorrow am -Daily weights -Duonebs Q4H prn  -Echo pending  HCAP: CXR showed vidence of resolving bronchopneumonia in the right middle and lower lobes. Patient did complain of shortness of breath (due to pulmonary edema), but also admitted to productive cough with clear sputum. Patient has been afebrile and without leukocytosis.  -Discontinue cefepime/vancomycin -Levaquin po per pharmacy for a total of 5 days (04/24/14>>04/28/14)  ESRD on HD TTS: Last completed HD yesterday. Patient is on Renvela at home. Patient will likely go for an HD session tomrorow. -Continue HD -Continue Renvela -Nephrology following, appreciate recommendations  Hyperkalemia: Resolved. 4.3 this am.  -Repeat BMET tomorrow am  Chronic Diarrhea: Patient admits to chronic diarrhea from hemodialysis medications. He denies fever, chills, or abdominal pain. -C. Diff pending  HTN: Patient is on lisinopril 20 mg daily, amlodipine 10 mg daily and Toprol XL 100 mg daily at home. Unclear history of secondary hypertension as patient had hypertension since he was a teenager. Patient had biopsy when he was young but is unaware of diagnosis, possibly nephrosclerosis? Patient can be considered for outpatient work up. Dr. Arlean Hopping recommends switching metoprolol to labetalol and continuing home regimen.  -Amlodipine 10 mg daily -Discontinue Toprol XL 100 mg daily -Start Labetalol 300 mg BID -Lisinopril 20 mg daily -Hydralazine 10 mg IV Q6H prn >200/110  DVT/PE ppx: Heparin TID  Dispo: Disposition  is deferred at this time, awaiting improvement of current medical problems.  Anticipated discharge in approximately 1-2 day(s).   The patient does have a current PCP (Cecille Aver, MD) and does need an [facility] hospital follow-up appointment after discharge.  The patient does not have transportation limitations that hinder transportation to clinic appointments.  .Services Needed at time of discharge: Y = Yes, Blank = No PT:   OT:   RN:   Equipment:   Other:     LOS: 1 day   Jill Alexanders, DO PGY-1 Internal Medicine Resident Pager # [phone] 04/25/2014 10:48 AM

## 2014-04-25 NOTE — Progress Notes (Signed)
Patient positive for c-diff. Discussed c-diff and went over precautions again with family.  All questions answered.

## 2014-04-26 ENCOUNTER — Encounter (HOSPITAL_COMMUNITY): Payer: Self-pay | Admitting: Physician Assistant

## 2014-04-26 DIAGNOSIS — A047 Enterocolitis due to Clostridium difficile: Secondary | ICD-10-CM

## 2014-04-26 DIAGNOSIS — I509 Heart failure, unspecified: Secondary | ICD-10-CM

## 2014-04-26 DIAGNOSIS — R0600 Dyspnea, unspecified: Secondary | ICD-10-CM

## 2014-04-26 DIAGNOSIS — E8779 Other fluid overload: Secondary | ICD-10-CM

## 2014-04-26 DIAGNOSIS — I5041 Acute combined systolic (congestive) and diastolic (congestive) heart failure: Secondary | ICD-10-CM

## 2014-04-26 DIAGNOSIS — E440 Moderate protein-calorie malnutrition: Secondary | ICD-10-CM | POA: Insufficient documentation

## 2014-04-26 DIAGNOSIS — I10 Essential (primary) hypertension: Secondary | ICD-10-CM

## 2014-04-26 DIAGNOSIS — J810 Acute pulmonary edema: Principal | ICD-10-CM

## 2014-04-26 DIAGNOSIS — I5040 Unspecified combined systolic (congestive) and diastolic (congestive) heart failure: Secondary | ICD-10-CM

## 2014-04-26 DIAGNOSIS — I428 Other cardiomyopathies: Secondary | ICD-10-CM

## 2014-04-26 LAB — CBC
HCT: 35.5 % — ABNORMAL LOW (ref 39.0–52.0)
HEMOGLOBIN: 11.9 g/dL — AB (ref 13.0–17.0)
MCH: 33.1 pg (ref 26.0–34.0)
MCHC: 33.5 g/dL (ref 30.0–36.0)
MCV: 98.9 fL (ref 78.0–100.0)
Platelets: 169 10*3/uL (ref 150–400)
RBC: 3.59 MIL/uL — AB (ref 4.22–5.81)
RDW: 13.9 % (ref 11.5–15.5)
WBC: 3.8 10*3/uL — ABNORMAL LOW (ref 4.0–10.5)

## 2014-04-26 LAB — RENAL FUNCTION PANEL
ALBUMIN: 2.7 g/dL — AB (ref 3.5–5.2)
Anion gap: 17 — ABNORMAL HIGH (ref 5–15)
BUN: 71 mg/dL — AB (ref 6–23)
CO2: 25 mmol/L (ref 19–32)
Calcium: 8.6 mg/dL (ref 8.4–10.5)
Chloride: 95 mmol/L — ABNORMAL LOW (ref 96–112)
Creatinine, Ser: 17.67 mg/dL — ABNORMAL HIGH (ref 0.50–1.35)
GFR calc Af Amer: 4 mL/min — ABNORMAL LOW (ref >90)
GFR calc non Af Amer: 3 mL/min — ABNORMAL LOW (ref >90)
Glucose, Bld: 105 mg/dL — ABNORMAL HIGH (ref 70–99)
PHOSPHORUS: 11 mg/dL — AB (ref 2.3–4.6)
Potassium: 4.6 mmol/L (ref 3.5–5.1)
Sodium: 137 mmol/L (ref 135–145)

## 2014-04-26 MED ORDER — CARVEDILOL 25 MG PO TABS
25.0000 mg | ORAL_TABLET | Freq: Two times a day (BID) | ORAL | Status: DC
  Administered 2014-04-26 – 2014-04-27 (×2): 25 mg via ORAL
  Filled 2014-04-26 (×4): qty 1

## 2014-04-26 MED ORDER — NEPRO/CARBSTEADY PO LIQD
237.0000 mL | Freq: Two times a day (BID) | ORAL | Status: DC
  Administered 2014-04-26 – 2014-04-27 (×2): 237 mL via ORAL

## 2014-04-26 MED ORDER — RENA-VITE PO TABS
1.0000 | ORAL_TABLET | Freq: Every day | ORAL | Status: DC
  Administered 2014-04-26: 1 via ORAL
  Filled 2014-04-26 (×2): qty 1

## 2014-04-26 MED ORDER — WHITE PETROLATUM GEL
Status: AC
  Administered 2014-04-26: 23:00:00
  Filled 2014-04-26: qty 1

## 2014-04-26 MED ORDER — CALCITRIOL 0.5 MCG PO CAPS: 1.0000 ug | ORAL_CAPSULE | ORAL | Status: DC

## 2014-04-26 MED ORDER — HYDRALAZINE HCL 25 MG PO TABS
25.0000 mg | ORAL_TABLET | Freq: Three times a day (TID) | ORAL | Status: DC
  Administered 2014-04-26 – 2014-04-27 (×2): 25 mg via ORAL
  Filled 2014-04-26 (×7): qty 1

## 2014-04-26 MED ORDER — CALCITRIOL 0.5 MCG PO CAPS
1.0000 ug | ORAL_CAPSULE | ORAL | Status: DC
  Filled 2014-04-26: qty 2

## 2014-04-26 MED ORDER — ISOSORBIDE MONONITRATE ER 30 MG PO TB24
30.0000 mg | ORAL_TABLET | Freq: Every day | ORAL | Status: DC
  Administered 2014-04-27: 30 mg via ORAL
  Filled 2014-04-26: qty 1

## 2014-04-26 NOTE — Clinical Documentation Improvement (Signed)
Supporting Information:  Newly Diagnosed Combined Heart Failure: Echocardiogram 1/31 (ordered to assess cardiac status given long-standing hypertension) showed EF 35-40%, moderate concentric hypertrophy LV, moderate hypokinesis, small pericardial effusion, and grade II diastolic dysfunction per 04/26/14 progress notes.  Labs: 04/24/14:  BNP: 3984.2   Possible Clinical Condition: . Document acuity --Acute --Chronic --Acute on Chronic . Document type --Diastolic --Systolic --Combined systolic and diastolic . Due to or associated with --Cardiac or other surgery --Hypertension --Valvular disease --Rheumatic heart disease Endocarditis (valvitis) Pericarditis Myocarditis --Other (specify)    Thank Gabriel Cirri Documentation Specialist [phone] [email or web address]

## 2014-04-26 NOTE — Progress Notes (Signed)
INITIAL NUTRITION ASSESSMENT  DOCUMENTATION CODES Per approved criteria  -Non-severe (moderate) malnutrition in the context of chronic illness  Pt meets criteria for non-severe (moderate) malnutrition in the context of chronic illness as evidenced by 7.5% weight loss in 3 months and mild-moderate body fat and muscle mass depletion.    INTERVENTION: Provide Nepro BID each provides 425 kcal, 19 grams protein   NUTRITION DIAGNOSIS: Unintentional weight loss related to chronic illness as evidenced by >7.5% wt loss in past 3 months.   Goal: Pt to meet >/= 90% of estimated energy requirements; met  Monitor:  PO intake, weight, appetite, labs, I/Os  Reason for Assessment: Pt identified due to Malnutrition Screening Tool   29 y.o. male  Admitting Dx: HCAP  ASSESSMENT: 29 y/o male with hx of HTN for many years, ESRD d/t HTN and on HD for 3 years. Pt admitted with SOB, orthopnea and pulmonary edema. He has been experiencing chronic diarrhea, but he attributes it to his HD medications. Pt tested C. diff positive. He reported N/V, anorexia, fatigue for some time, wt loss and muscle loss. Pt reports he recieves 3 HD days per week, 3 hr treatments.   Pt confirmed wt loss, and reported ~75 lbs wt loss over past three years r/t HD treatments. He stated that he had a 20 lb wt loss over the past 3 months. Pt reported his dry wt is 79 kg (173 lbs). He denied any nausea, vomiting or abdominal pain. He reported diarrhea is subsiding, and had a solid stool this morning.  Pt reported good appetite pta, and has been eating 100% of meals throughout his hospital stay. Pt stated he eats three meals daily at home, and experiences N/V occasionally d/t "build up of toxins." He tries to drink Nepro as often as possible to maintain wt, but is having difficulty affording them.   Scheduled to received Calcitriol, MVI Labs show poor BUN/Creatinine ratio  Will provide Nepro BID  Nutrition Focused Physical  Exam:  Subcutaneous Fat:  Orbital Region: mild depletion Upper Arm Region: mild depletion Thoracic and Lumbar Region: WDL  Muscle:  Temple Region: moderate dpeletion Clavicle Bone Region:mild depletion Clavicle and Acromion Bone Region: WDL Scapular Bone Region: WDL Dorsal Hand: WDL Patellar Region: moderate depletion Anterior Thigh Region: mild depletion Posterior Calf Region: mild depletion  Edema: not present  Height: Ht Readings from Last 1 Encounters:  04/25/14 '5\' 11"'  (1.803 m)    Weight: Wt Readings from Last 1 Encounters:  04/25/14 175 lb 7.8 oz (79.6 kg)    Ideal Body Weight: 75.3 kg (165.6 lbs)  % Ideal Body Weight: 105.7%  Wt Readings from Last 10 Encounters:  04/25/14 175 lb 7.8 oz (79.6 kg)  04/20/14 179 lb 0.2 oz (81.2 kg)  03/02/14 161 lb (73.029 kg)  09/11/12 223 lb (101.152 kg)  06/27/12 226 lb (102.513 kg)    Usual Body Weight: 250 lbs (prior to HD)  % Usual Body Weight: 66%  BMI:  Body mass index is 24.49 kg/(m^2).  Estimated Nutritional Needs: Kcal: 2,200-2,400 Protein: 105-115 grams Fluid: 1.2 L/day  Skin: intact  Diet Order: Diet renal W/1234m fluid restriction  EDUCATION NEEDS: -No education needs identified at this time   Intake/Output Summary (Last 24 hours) at 04/26/14 1132 Last data filed at 04/25/14 1300  Gross per 24 hour  Intake    360 ml  Output      0 ml  Net    360 ml    Last BM: 2/1 (solid stool  this morning)  Labs:   Recent Labs Lab 04/20/14 0628 04/24/14 0746 04/25/14 0440  NA 137 135 137  K 6.9* 5.7* 4.3  CL 92* 92* 96  CO2 '23 22 27  ' BUN 102* 104* 51*  CREATININE 22.17* 22.01* 14.83*  CALCIUM 8.8 9.2 8.9  GLUCOSE 100* 120* 96    CBG (last 3)  No results for input(s): GLUCAP in the last 72 hours.  Scheduled Meds: . amLODipine  10 mg Oral QPM  . [START ON 04/27/2014] calcitRIOL  1 mcg Oral Q T,Th,Sa-HD  . heparin  5,000 Units Subcutaneous 3 times per day  . labetalol  300 mg Oral BID  .  lisinopril  20 mg Oral Daily  . metroNIDAZOLE  500 mg Oral 3 times per day  . multivitamin  1 tablet Oral QHS  . sevelamer carbonate  2,400 mg Oral TID WC  . sodium chloride  3 mL Intravenous Q12H    Continuous Infusions:   Past Medical History  Diagnosis Date  . Hypertension   . Chronic kidney disease     Past Surgical History  Procedure Laterality Date  . Fracture surgery Left 2005    Ankle  . Av fistula placement Right March 2013    Wynona Dove, MS Dietetic Intern Pager: [phone]

## 2014-04-26 NOTE — Consult Note (Signed)
CARDIOLOGY CONSULT NOTE   Patient ID: Austin Hardy MRN: [id number], DOB/AGE: 22-Nov-1985   Admit date: 04/24/2014 Date of Consult: 04/26/2014   Primary Physician: Cecille Aver, MD Primary Cardiologist: New (originally scheduled for see Dr. Rennis Golden in Mar 2016)  Pt. Profile  29 year old African-American male with past medical history of hypertension and ESRD on HD T/Th/Sat, however no prior cardiac history present with increasing dyspnea, orthopnea and PND for 2 weeks  Problem List  Past Medical History  Diagnosis Date  . Hypertension   . Chronic kidney disease     Past Surgical History  Procedure Laterality Date  . Fracture surgery Left 2005    Ankle  . Av fistula placement Right March 2013     Allergies  No Known Allergies  HPI   The patient is a 29 year old African-American male with past medical history of hypertension and ESRD on HD T/Th/Sat, however no prior cardiac history. He has been on hemodialysis via right upper extremity AV fistula for the last 3 years.  He denies any significant family history of CAD or renal problem, however does endorse significant history of hypertension running on both side of the family. He states he had a treadmill stress test in [place] in 2014 which was normal. This record is not available to [place] at this time. He denies any other prior cardiac workup. He does use occasional marijuana, however denies any cocaine use, the last use was 4 weeks ago. He states he has been having trouble getting his blood pressure under control even though he has been compliant with his medication. His medication was recently adjusted, however he started having increasing shortness of breath for the last 2 weeks associated with orthopnea and paroxysmal nocturnal dyspnea. He denies any significant lower extremity edema. He also endorsed a chronic diarrhea. Prior to that, he has been very active playing basketball. He denies any exertional chest pain nor has he  ever had any chest pain before. He was supposed to see Dr. Rennis Golden as outpatient for dyspnea in early March. However his symptoms worsened prompting the patient to seek medical attention at [facility] on 04/24/2014.   On arrival significant laboratory finding include potassium of 5.7, creatinine 2.01, BNP elevated at 3984, negative troponin. Initial chest x-ray showed stable mild pulmonary edema seen in left lung base, increased right lower lobe opacity concerning for pneumonia or edema. EKG showed mild sinus tachycardia, lateral T-wave inversion, poor R wave progression in anterior leads. Patient was treated with antibiotics for potential pneumonia. He underwent urgent hemodialysis due to worsening repeat chest x-ray. Nephrology was consulted to help with hemodialysis. His blood pressure on arrival was also noted to be elevated at 188/135. His Toprol-XL was switched to labetalol to provide better blood pressure control along with lisinopril and amlodipine. C. difficile panel came back positive for C. difficile infection. He was started on Flagyl. She said echocardiogram was obtained on 04/26/2014 which showed EF 35-40%, grade 2 diastolic dysfunction, PA peak pressure 36, mild AR. Cardiology has been consulted for heart failure and the newly diagnosed LV dysfunction.   Inpatient Medications  . amLODipine  10 mg Oral QPM  . [START ON 04/27/2014] calcitRIOL  1 mcg Oral Q T,Th,Sa-HD  . feeding supplement (NEPRO CARB STEADY)  237 mL Oral BID BM  . heparin  5,000 Units Subcutaneous 3 times per day  . labetalol  300 mg Oral BID  . lisinopril  20 mg Oral Daily  . metroNIDAZOLE  500 mg Oral  3 times per day  . multivitamin  1 tablet Oral QHS  . sevelamer carbonate  2,400 mg Oral TID WC  . sodium chloride  3 mL Intravenous Q12H    Family History Family History  Problem Relation Age of Onset  . Diabetes Mother   . Hypertension Mother   . Diabetes Father   . Hypertension Father      Social  History History   Social History  . Marital Status: Single    Spouse Name: N/A    Number of Children: N/A  . Years of Education: N/A   Occupational History  . Not on file.   Social History Main Topics  . Smoking status: Current Every Day Smoker -- 0.50 packs/day    Last Attempt to Quit: 03/27/2003  . Smokeless tobacco: Never Used  . Alcohol Use: No  . Drug Use: No  . Sexual Activity: Yes   Other Topics Concern  . Not on file   Social History Narrative     Review of Systems  General:  No night sweats or weight changes.  +chills, fever Cardiovascular:  No chest pain, dyspnea on exertion, edema, palpitations. +Orthopnea, paroxysmal nocturnal dyspnea. Dermatological: No rash, lesions/masses Respiratory: No cough. +dyspnea Urologic: No hematuria, dysuria Abdominal:   No nausea, vomiting, bright red blood per rectum, melena, or hematemesis +diarrhea Neurologic:  No visual changes, wkns, changes in mental status. All other systems reviewed and are otherwise negative except as noted above.  Physical Exam  Blood pressure 162/107, pulse 85, temperature 97.9 F (36.6 C), temperature source Oral, resp. rate 16, height 5\' 11"  (1.803 m), weight 170 lb 10.2 oz (77.4 kg), SpO2 99 %.  General: Pleasant, NAD Psych: Normal affect. Neuro: Alert and oriented X 3. Moves all extremities spontaneously. HEENT: Normal  Neck: Supple without bruits or JVD. Lungs:  Resp regular and unlabored, CTA. Heart: RRR no s3, s4, or murmurs. Abdomen: Soft, non-tender, non-distended, BS + x 4.  Extremities: No clubbing, cyanosis or edema. R AVF noted.  Labs  No results for input(s): CKTOTAL, CKMB, TROPONINI in the last 72 hours. Lab Results  Component Value Date   WBC 3.8* 04/26/2014   HGB 11.9* 04/26/2014   HCT 35.5* 04/26/2014   MCV 98.9 04/26/2014   PLT 169 04/26/2014    Recent Labs Lab 04/26/14 0500  NA 137  K 4.6  CL 95*  CO2 25  BUN 71*  CREATININE 17.67*  CALCIUM 8.6  GLUCOSE  105*   Lab Results  Component Value Date   CHOL 83 09/11/2012   HDL 36* 09/11/2012   LDLCALC 26 09/11/2012   TRIG 107 09/11/2012   No results found for: DDIMER  Radiology/Studies  X-ray Chest Pa And Lateral  04/25/2014   CLINICAL DATA:  29 year old male with shortness of breath and hypertension.  EXAM: CHEST  2 VIEW  COMPARISON:  Chest x-ray 04/24/2014.  FINDINGS: Significantly improved aeration compared to yesterday's examination. No residual signs of pulmonary edema. However, there continues to be and ill-defined area of interstitial prominence and peribronchial cuffing throughout the right mid to lower lung involving portions of the right middle lobe and right lower lobe, likely to reflect resolving bronchopneumonia. Pulmonary vasculature is now normal. Heart size remains borderline enlarged. Upper mediastinal contours are within normal limits.  IMPRESSION: 1. Significantly improved aeration with resolved pulmonary edema but persistent evidence of resolving bronchopneumonia in the right middle and lower lobes.   Electronically Signed   By: Trudie Reed M.D.   On:  04/25/2014 08:56   Dg Chest 2 View  04/24/2014   CLINICAL DATA:  Shortness of breath.  EXAM: CHEST  2 VIEW  COMPARISON:  April 20, 2014.  FINDINGS: Stable cardiomegaly. No pneumothorax or significant pleural effusion is noted. Increased right lower lobe opacity is noted compared to prior exam consistent with worsening pneumonia or edema. Stable Kerley B-lines are noted laterally in left lung base concerning for pulmonary edema. Bony thorax is intact.  IMPRESSION: Stable mild pulmonary edema seen in left lung base. Increased right lower lobe opacity is noted concerning for pneumonia or edema.   Electronically Signed   By: Roque Lias M.D.   On: 04/24/2014 08:12   Dg Chest 2 View (if Patient Has Fever And/or Copd)  04/20/2014   CLINICAL DATA:  Shortness of breath.  EXAM: CHEST  2 VIEW  COMPARISON:  03/02/2014.  FINDINGS:  Mediastinum and hilar structures are normal. Cardiomegaly with bilateral pulmonary interstitial prominence including Kerley B-lines. Findings consistent with congestive heart failure. There is a right lower lobe alveolar infiltrate. This could represent focal pulmonary edema or pneumonia. No pleural effusion or pneumothorax.  IMPRESSION: 1. Cardiomegaly with bilateral pulmonary interstitial prominence including Kerley B-lines. These findings are consistent with congestive heart failure. 2. Right lower low alveolar infiltrate. This could represent focal pulmonary edema and/or pneumonia.   Electronically Signed   By: Maisie Fus  Register   On: 04/20/2014 07:10   Dg Chest Port 1v Same Day  04/24/2014   CLINICAL DATA:  29 year old male with a history of pulmonary edema. Hypertension and chronic kidney disease.  EXAM: PORTABLE CHEST - 1 VIEW SAME DAY  COMPARISON:  Chest x-ray 04/24/2014, 04/20/2014, 03/02/2014, 03/11/2011  FINDINGS: Cardiomediastinal silhouette unchanged in size and contour, though partially obscured by overlying lung and pleural disease.  Compared to the prior, there is increasing opacity at the bilateral lung bases partially obscuring the hemidiaphragms and the heart borders. Mixture of interstitial and airspace disease, with interlobular septal thickening.  No displaced fracture.  IMPRESSION: Worsening mixed airspace and interstitial disease compared to the recent chest x-ray, concerning for worsening edema, although infection cannot be excluded.  Signed,  Yvone Neu. Loreta Ave, DO  [facility]  [facility]   Electronically Signed   By: Gilmer Mor D.O.   On: 04/24/2014 12:16    ECG  EKG showed mild sinus tachycardia, lateral T-wave inversion, poor R wave progression in anterior leads.  ASSESSMENT AND PLAN  1. 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 Fluid management per nephrology  - given ESRD, will switch ACEI to imdur 30mg  daily. Will add nitrate later if BP stable. Switch labetalol to coreg 25mg  BID today.  - ultimate goal for this young HF pt would be coreg 25mg  BID and nitrate/hydralazine combo.  - need repeat outpatient treadmill myoview after discharge (TWI in lateral lead, reported neg treadmill nuc in [place] in 2014, has been active w/o angina, young age)  - likely discharge once BP stable on new BP medication. Repeat echo in 3 month, hopefully will see improved EF  2. HTN: uncontrolled  - currently on labetalol, ACEI and amlodipine.  - see above: change labetalol to coreg, d/c ACEI, add long acting nitrate. Will add hydralazine as well later if BP has room.   3. ESRD: nephrology concerned about noncompliance, per pt, he has been compliant except twice in the last  month due to weather.   4. C-diff infection: per primary   5. Occasional marijuana use  6. Hyperkalemia: resolved after HD  Signed, Azalee Course, PA-C 04/26/2014, 3:43 PM   The patient was seen, examined and discussed with Azalee Course, PA-C and I agree with the above.   28 year old patient with ESRD on HD secondary to poorly controlled HTN who was in his usual state of health until 2 weeks ago when he developed DOE and was diagnosed with acute combined systolic and diastolic CHF. He was 2 lbs above his dry weight on presentation and was diuresed by HD - currently 5 lbs below his dry weight and appears euvolemic. His echocardiogram shows moderate concentric LVH with global hypokinesis most probably as a result of long standing hypertension. However ischemia work up needs to be done - we will schedule an outpatient exercise nuclear stress test.  I would recommend to switch labetalol for carvedilol, discontinue lisinopril considering ESRD with hyperkalemia and start imdur/hydralazine with 30 mg imdur po daily and hydralazine 25 mg po TID.   We  will follow in our clinic.  Lars Masson 04/26/2014

## 2014-04-26 NOTE — Progress Notes (Signed)
  Echocardiogram 2D Echocardiogram has been performed.  Austin Hardy [name] 04/26/2014, 9:42 AM

## 2014-04-26 NOTE — Progress Notes (Signed)
MD on call notified that pt has c/o dry patches on scalp vasoline tube given for this and that he has on his left side under his rib cage pt states swelling. Area is not red or painful to touch last bowel movement was 04/26/14

## 2014-04-26 NOTE — Procedures (Signed)
Goal 4500cc, hemodynamically stable.  BP elevated.  He is very convincing about his compliance with treatment, but record from kidney center is inconsistent with adequate dialysis adherence. Austin Hardy

## 2014-04-26 NOTE — Progress Notes (Signed)
[facility] Progress Note  Assessment/Plan: 1. Pulmonary edema/PNA/SOB - repeat cXR shows improvement in volume status but persistent evidence of PNA in RML and RLL (not on antibiotics at present) 2. ESRD -TTS - had HD Sat and scheduled for today; uremic with hyperkalemia on admission - had a long history of noncompliance with HD coming maybe once a week, in Jan started coming twice a week but comes late and gets < 3 hours - saw Dr. Marisue Humble 1/19 and time ^ to 3 days a week 4 hour treatments; on 1/21 (outpt) he only ran 2 hr 40 min - Cr and K improved with dialysis here; reassess in the am and decide whether to have HD again on Tuesday to keep on schedule - will have a lower EDW at d/c 3. Anemia - Hgb 13.4 - no ESA  4. Secondary hyperparathyroidism - add calcitriol 5. HTN/volume - BP much improved with medication and HD - had net UF 4.3 on Saturday; He didn't have a post HD weight Saturday night and weight variances are large making them unreliable- BP so much improved here with medications, I suspect he was not taking meds as Rx at home, even though he says he is; EDW 80 - had been getting to about 81.5 with ^ BPs higher than here 6. Nutrition - renal diet - add vitamin 7. Dialysis and medical compliance - has been a HUGE issue for him at the outpt center; it is necessary for him to come on time in order to get a full treatment 8. C diff + on flagyl - had been taking doxy for a red rash on access (no notes in [place] about this)  Sheffield Slider, PA-C [facility] Beeper [phone] 04/26/2014,11:09 AM  LOS: 2 days    Renal Attending: As above, agree with plan as articulated. Soleia Badolato C   Subjective:   No complaints - no cough, breathing fine  Objective Filed Vitals:   04/25/14 1000 04/25/14 1746 04/25/14 2036 04/26/14 0633  BP: 152/93 166/88 143/89 153/91  Pulse: 110 112 99 92  Temp: 98 F (36.7 C) 98 F (36.7 C) 98.1 F (36.7 C) 98 F (36.7 C)   TempSrc: Oral Oral Oral Oral  Resp: 18 18 19 18   Height:      Weight:      SpO2: 100% 100% 98% 99%   Physical Exam General: NAD Heart: RRR  Lungs: grossly clear bilaterally  Abdomen: soft NT Extremities: no LE edema Dialysis Access:  Right lower AVF  + bruit  Dialysis Orders:  HD: [facility] TTS 4h 450/800 2/2.0 Bath 80kg Heparin 6000 RFA AVF Calcitriol 1.0 ug TIW   Additional Objective Labs: Basic Metabolic Panel:  Recent Labs Lab 04/20/14 0628 04/24/14 0746 04/25/14 0440  NA 137 135 137  K 6.9* 5.7* 4.3  CL 92* 92* 96  CO2 23 22 27   GLUCOSE 100* 120* 96  BUN 102* 104* 51*  CREATININE 22.17* 22.01* 14.83*  CALCIUM 8.8 9.2 8.9  CBC:  Recent Labs Lab 04/20/14 0628 04/24/14 0746 04/25/14 0440  WBC 5.0 6.8 5.5  NEUTROABS  --  4.7  --   HGB 12.0* 13.6 13.4  HCT 36.6* 39.5 40.0  MCV 101.4* 88.0 98.3  PLT 193 254 182   CBG:  Recent Labs Lab 04/20/14 0719 04/20/14 0825  GLUCAP 59* 136*  Studies/Results: X-ray Chest Pa And Lateral  04/25/2014   CLINICAL DATA:  29 year old male with shortness of breath and hypertension.  EXAM: CHEST  2 VIEW  COMPARISON:  Chest x-ray 04/24/2014.  FINDINGS: Significantly improved aeration compared to yesterday's examination. No residual signs of pulmonary edema. However, there continues to be and ill-defined area of interstitial prominence and peribronchial cuffing throughout the right mid to lower lung involving portions of the right middle lobe and right lower lobe, likely to reflect resolving bronchopneumonia. Pulmonary vasculature is now normal. Heart size remains borderline enlarged. Upper mediastinal contours are within normal limits.  IMPRESSION: 1. Significantly improved aeration with resolved pulmonary edema but persistent evidence of resolving bronchopneumonia in the right middle and lower lobes.   Electronically Signed   By: Trudie Reed M.D.   On: 04/25/2014 08:56   Dg Chest Port 1v Same Day  04/24/2014    CLINICAL DATA:  29 year old male with a history of pulmonary edema. Hypertension and chronic kidney disease.  EXAM: PORTABLE CHEST - 1 VIEW SAME DAY  COMPARISON:  Chest x-ray 04/24/2014, 04/20/2014, 03/02/2014, 03/11/2011  FINDINGS: Cardiomediastinal silhouette unchanged in size and contour, though partially obscured by overlying lung and pleural disease.  Compared to the prior, there is increasing opacity at the bilateral lung bases partially obscuring the hemidiaphragms and the heart borders. Mixture of interstitial and airspace disease, with interlobular septal thickening.  No displaced fracture.  IMPRESSION: Worsening mixed airspace and interstitial disease compared to the recent chest x-ray, concerning for worsening edema, although infection cannot be excluded.  Signed,  Yvone Neu. Loreta Ave, DO  [facility]  [facility]   Electronically Signed   By: Gilmer Mor D.O.   On: 04/24/2014 12:16   Medications:   . amLODipine  10 mg Oral QPM  . heparin  5,000 Units Subcutaneous 3 times per day  . labetalol  300 mg Oral BID  . lisinopril  20 mg Oral Daily  . metroNIDAZOLE  500 mg Oral 3 times per day  . sevelamer carbonate  2,400 mg Oral TID WC  . sodium chloride  3 mL Intravenous Q12H

## 2014-04-26 NOTE — Progress Notes (Signed)
Subjective: Austin Hardy is experiencing no pain or shortness of breath. He has had no headaches, chest pain, difficulty breathing or extremity swelling, and feels like he could go home today.   Objective: Vital signs in last 24 hours: Filed Vitals:   04/25/14 1000 04/25/14 1746 04/25/14 2036 04/26/14 0633  BP: 152/93 166/88 143/89 153/91  Pulse: 110 112 99 92  Temp: 98 F (36.7 C) 98 F (36.7 C) 98.1 F (36.7 C) 98 F (36.7 C)  TempSrc: Oral Oral Oral Oral  Resp: 18 18 19 18   Height:      Weight:      SpO2: 100% 100% 98% 99%   Weight change:   Intake/Output Summary (Last 24 hours) at 04/26/14 0631 Last data filed at 04/25/14 1300  Gross per 24 hour  Intake    600 ml  Output      0 ml  Net    600 ml   Physical Exam: Appearance: in NAD, lying in bed with lights out and girlfriend sleeping at bedside HEENT: AT/[place], PERRL, EOMi, no lymphadenopathy Heart: no longer tachycardic, regular rhythm, normal S1S2, no MRG Lungs: CTAB, no WRR, normal work of breathing  Abdomen: BS+, soft, non-tender, non-distended Musculoskeletal: normal range of motion Extremities: no edema  Neurologic: A&Ox3, grossly intact Skin: warm and dry with no rashes or erythema  Lab Results: Basic Metabolic Panel:  Recent Labs Lab 04/24/14 0746 04/25/14 0440  NA 135 137  K 5.7* 4.3  CL 92* 96  CO2 22 27  GLUCOSE 120* 96  BUN 104* 51*  CREATININE 22.01* 14.83*  CALCIUM 9.2 8.9   CBC:  Recent Labs Lab 04/24/14 0746 04/25/14 0440  WBC 6.8 5.5  NEUTROABS 4.7  --   HGB 13.6 13.4  HCT 39.5 40.0  MCV 88.0 98.3  PLT 254 182   CBG:  Recent Labs Lab 04/20/14 0719 04/20/14 0825  GLUCAP 59* 136*   Micro Results: Recent Results (from the past 240 hour(s))  MRSA PCR Screening     Status: None   Collection Time: 04/24/14  2:17 PM  Result Value Ref Range Status   MRSA by PCR NEGATIVE NEGATIVE Final    Comment:        The GeneXpert MRSA Assay (FDA approved for NASAL specimens only), is  one component of a comprehensive MRSA colonization surveillance program. It is not intended to diagnose MRSA infection nor to guide or monitor treatment for MRSA infections.   Clostridium Difficile by PCR     Status: Abnormal   Collection Time: 04/25/14  8:09 AM  Result Value Ref Range Status   C difficile by pcr POSITIVE (A) NEGATIVE Final    Comment: CRITICAL RESULT CALLED TO, READ BACK BY AND VERIFIED WITH: R.SPARKS,RN 04/25/14 @1505  BY V.WILKINS    Studies/Results: X-ray Chest Pa And Lateral  04/25/2014   CLINICAL DATA:  29 year old male with shortness of breath and hypertension.  EXAM: CHEST  2 VIEW  COMPARISON:  Chest x-ray 04/24/2014.  FINDINGS: Significantly improved aeration compared to yesterday's examination. No residual signs of pulmonary edema. However, there continues to be and ill-defined area of interstitial prominence and peribronchial cuffing throughout the right mid to lower lung involving portions of the right middle lobe and right lower lobe, likely to reflect resolving bronchopneumonia. Pulmonary vasculature is now normal. Heart size remains borderline enlarged. Upper mediastinal contours are within normal limits.  IMPRESSION: 1. Significantly improved aeration with resolved pulmonary edema but persistent evidence of resolving bronchopneumonia in the right  middle and lower lobes.   Electronically Signed   By: Trudie Reed M.D.   On: 04/25/2014 08:56   Dg Chest 2 View  04/24/2014   CLINICAL DATA:  Shortness of breath.  EXAM: CHEST  2 VIEW  COMPARISON:  April 20, 2014.  FINDINGS: Stable cardiomegaly. No pneumothorax or significant pleural effusion is noted. Increased right lower lobe opacity is noted compared to prior exam consistent with worsening pneumonia or edema. Stable Kerley B-lines are noted laterally in left lung base concerning for pulmonary edema. Bony thorax is intact.  IMPRESSION: Stable mild pulmonary edema seen in left lung base. Increased right lower  lobe opacity is noted concerning for pneumonia or edema.   Electronically Signed   By: Roque Lias M.D.   On: 04/24/2014 08:12   Dg Chest Port 1v Same Day  04/24/2014   CLINICAL DATA:  29 year old male with a history of pulmonary edema. Hypertension and chronic kidney disease.  EXAM: PORTABLE CHEST - 1 VIEW SAME DAY  COMPARISON:  Chest x-ray 04/24/2014, 04/20/2014, 03/02/2014, 03/11/2011  FINDINGS: Cardiomediastinal silhouette unchanged in size and contour, though partially obscured by overlying lung and pleural disease.  Compared to the prior, there is increasing opacity at the bilateral lung bases partially obscuring the hemidiaphragms and the heart borders. Mixture of interstitial and airspace disease, with interlobular septal thickening.  No displaced fracture.  IMPRESSION: Worsening mixed airspace and interstitial disease compared to the recent chest x-ray, concerning for worsening edema, although infection cannot be excluded.  Signed,  Yvone Neu. Loreta Ave, DO  [facility]  [facility]   Electronically Signed   By: Gilmer Mor D.O.   On: 04/24/2014 12:16   Echo: 04/25/14: Study Conclusions - Left ventricle: The cavity size was normal. There was moderate concentric hypertrophy. Systolic function was moderately reduced. The estimated ejection fraction was in the range of 35% to 40%. Moderate diffuse hypokinesis with no identifiable regional variations. Features are consistent with a pseudonormal left ventricular filling pattern, with concomitant abnormal relaxation and increased filling pressure (grade 2 diastolic dysfunction). - Aortic valve: There was mild regurgitation. - Left atrium: The atrium was moderately dilated. - Atrial septum: No defect or patent foramen ovale was identified. - Pulmonary arteries: Systolic pressure was mildly increased. PA peak pressure: 36 mm Hg (S). - Pericardium, extracardiac: A small, free-flowing  pericardial effusion was identified anterior to the heart. There was no evidence of hemodynamic compromise.  Medications: I have reviewed the patient's current medications. Scheduled Meds: . amLODipine  10 mg Oral QPM  . heparin  5,000 Units Subcutaneous 3 times per day  . labetalol  300 mg Oral BID  . [START ON 04/27/2014] levofloxacin  500 mg Oral Q48H  . lisinopril  20 mg Oral Daily  . metroNIDAZOLE  500 mg Oral 3 times per day  . sevelamer carbonate  2,400 mg Oral TID WC  . sodium chloride  3 mL Intravenous Q12H   Continuous Infusions:  PRN Meds:.hydrALAZINE, ipratropium-albuterol Assessment/Plan: Active Problems:   HCAP (healthcare-associated pneumonia)   Volume overload   Acute pulmonary edema   ESRD (end stage renal disease)   Essential hypertension   Shortness of breath  Austin Hardy is a 29 yo man with hypertension and ESRD who presented with shortness of breath likely secondary to pulmonary edema. On echo, he was found to have grade II diastolic dysfunction and an EF of 35-40%.   Pulmonary Edema: Shortness of breath was initially thought to be secondary to pulmonary  edema vs HCAP. His initial CXR showed concern fro edema but possible infection. He was taken to HD (5 kg off). Repeat CXR showed significant improvement in aeration with resolved pulmonary edema but persistent evidence of resolving bronchopneumonia in the RML and RLL. Though he initially had a cough productive of clear sputum, these symptoms completely resolved with HD. He is satting 98-100% on room air. Dr. Arlean Hopping (Nephrology) recommends extra HD session tomorrow and switching metoprolol to labetalol with a last resort minoxidil if HD and medications do not bring down his blood pressure. His initially shortness of breath was most likely secondary to the pulmonary edema and his antibiotics have been stopped.  - Appreciate nephrology following - HD today (extra session) - Cardiac monitoring - Renal diet -  Continue BP medications: labetalol 300 mg BID, amlodipine 10 mg daily, lisinopril 20 mg daily, hydralazine 10 mg IV q6hr PRN >200/110 - Daily weights - Duonebs q4hr PRN - D/c levaquin today (less likely HCAP)  Newly Diagnosed Combined Heart Failure: Echocardiogram 1/31 (ordered to assess cardiac status given long-standing hypertension) showed EF 35-40%, moderate concentric hypertrophy LV, moderate hypokinesis, small pericardial effusion, and grade II diastolic dysfunction. - Cardiology to see the patient today - Patient is already on an ACE-i (lisinopril), may need to consider changing labetalol-->metoprolol - Has appointment with Dr. Rennis Golden on 05/26/14  Clostridium Difficile: Presented with chronic diarrhea without fever, chills or abdominal pain. Found to be + for c diff. - Initiated metronidazole 500 mg TID on 1/31  Hypertension: Patient is on lisinopril 20 mg daily, amlodipine 10 mg daily and Toprol XL 100 mg daily at home. Unclear history of secondary hypertension as patient had hypertension since he was a teenager. Patient had biopsy when he was young but is unaware of diagnosis, possibly nephrosclerosis? Patient can be considered for outpatient work up. Dr. Arlean Hopping recommends switching metoprolol to labetalol and continuing home regimen.  - Amlodipine 10 mg daily - Continue Labetalol 300 mg BID  - Lisinopril 20 mg daily - Hydralazine 10 mg IV Q6H prn >200/110   DVT Ppx: Heparin TID  Diet: Renal  Dispo: Disposition is deferred at this time, awaiting improvement of current medical problems.  Anticipated discharge in approximately 0-1 day(s).   The patient does have a current PCP (Cecille Aver, MD) and does not need an [facility] hospital follow-up appointment after discharge.  The patient does not have transportation limitations that hinder transportation to clinic appointments.  .Services Needed at time of discharge: Y = Yes, Blank = No PT:   OT:   RN:   Equipment:   Other:       LOS: 2 days   Dionne Ano, MD 04/26/2014, 6:31 AM

## 2014-04-27 ENCOUNTER — Other Ambulatory Visit: Payer: Self-pay | Admitting: Physician Assistant

## 2014-04-27 ENCOUNTER — Inpatient Hospital Stay (HOSPITAL_COMMUNITY): Payer: Medicaid Other

## 2014-04-27 DIAGNOSIS — I519 Heart disease, unspecified: Secondary | ICD-10-CM

## 2014-04-27 DIAGNOSIS — E440 Moderate protein-calorie malnutrition: Secondary | ICD-10-CM

## 2014-04-27 LAB — HIV ANTIBODY (ROUTINE TESTING W REFLEX): HIV Screen 4th Generation wRfx: NONREACTIVE

## 2014-04-27 MED ORDER — CARVEDILOL 25 MG PO TABS: 25.0000 mg | ORAL_TABLET | Freq: Two times a day (BID) | ORAL | Status: DC

## 2014-04-27 MED ORDER — HYDRALAZINE HCL 25 MG PO TABS: 25.0000 mg | ORAL_TABLET | Freq: Three times a day (TID) | ORAL | Status: DC

## 2014-04-27 MED ORDER — ISOSORBIDE MONONITRATE ER 30 MG PO TB24: 30.0000 mg | ORAL_TABLET | Freq: Every day | ORAL | Status: DC

## 2014-04-27 MED ORDER — ISOSORBIDE MONONITRATE ER 30 MG PO TB24: 60.0000 mg | ORAL_TABLET | Freq: Every day | ORAL | Status: DC

## 2014-04-27 MED ORDER — METRONIDAZOLE 500 MG PO TABS: 500.0000 mg | ORAL_TABLET | Freq: Three times a day (TID) | ORAL | Status: DC

## 2014-04-27 NOTE — Care Management Note (Signed)
CARE MANAGEMENT NOTE 04/27/2014  Patient:  Austin Hardy, Austin Hardy   Account Number:  [id number]  Date Initiated:  04/27/2014  Documentation initiated by:  Pinkie Manger  Subjective/Objective Assessment:   CM following for progression and  d/c planning.     Action/Plan:   No d/c needs identified.   Anticipated DC Date:  04/27/2014   Anticipated DC Plan:  HOME/SELF CARE         Choice offered to / List presented to:             Status of service:  Completed, signed off Medicare Important Message given?  NO (If response is "NO", the following Medicare IM given date fields will be blank) Date Medicare IM given:   Medicare IM given by:   Date Additional Medicare IM given:   Additional Medicare IM given by:    Discharge Disposition:  HOME/SELF CARE  Per UR Regulation:    If discussed at Long Length of Stay Meetings, dates discussed:    Comments:

## 2014-04-27 NOTE — Progress Notes (Addendum)
Subjective: Had a cramp in his abdomen overnight and complained of dry scalp. Otherwise has been in no pain. He feels "underweight".  Admitted that he does not and will not take his home amlodipine (and in fact, has been throwing it away rather than take it in the hospital). He believes it leads to friable skin and does not like the way it makes him feel.  He does wish to establish an anti-hypertensive regimen that will help keep his blood pressure down with as few pills as possible.  Objective: Vital signs in last 24 hours: Filed Vitals:   04/26/14 1511 04/26/14 1554 04/26/14 1943 04/27/14 0500  BP: 162/107 158/107 170/110 148/100  Pulse: 85 86 93 81  Temp: 97.9 F (36.6 C) 98.1 F (36.7 C) 99.4 F (37.4 C) 98.5 F (36.9 C)  TempSrc: Oral Oral Oral Oral  Resp: Height:      Weight: 170 lb 10.2 oz (77.4 kg)     SpO2:  99% 99% 95%   Weight change:   Intake/Output Summary (Last 24 hours) at 04/27/14 0709 Last data filed at 04/26/14 1604  Gross per 24 hour  Intake    360 ml  Output   3588 ml  Net  -3228 ml   Physical Exam: Appearance: in NAD, lying in bed with mother and wife in room HEENT: AT/[place], PERRL, EOMi, no lymphadenopathy Heart: no longer tachycardic, regular rhythm, normal S1S2, no MRG Lungs: CTAB, no WRR, normal work of breathing  Abdomen: BS+, soft, non-tender, non-distended, excess skin folds in abdomen and chest Musculoskeletal: normal range of motion Extremities: no edema  Neurologic: A&Ox3, grossly intact Skin: several open wounds (arms, back, abdomen), warm and dry with no rashes or erythema  Lab Results: Basic Metabolic Panel:  Recent Labs Lab 04/25/14 0440 04/26/14 0500  NA 137 137  K 4.3 4.6  CL 96 95*  CO2 27 25  GLUCOSE 96 105*  BUN 51* 71*  CREATININE 14.83* 17.67*  CALCIUM 8.9 8.6  PHOS  --  11.0*   CBC:  Recent Labs Lab 04/24/14 0746 04/25/14 0440 04/26/14 0500  WBC 6.8 5.5 3.8*  NEUTROABS 4.7  --   --   HGB  13.6 13.4 11.9*  HCT 39.5 40.0 35.5*  MCV 88.0 98.3 98.9  PLT 254 182 169   CBG:  Recent Labs Lab 04/20/14 0719 04/20/14 0825  GLUCAP 59* 136*   Micro Results: Recent Results (from the past 240 hour(s))  MRSA PCR Screening     Status: None   Collection Time: 04/24/14  2:17 PM  Result Value Ref Range Status   MRSA by PCR NEGATIVE NEGATIVE Final    Comment:        The GeneXpert MRSA Assay (FDA approved for NASAL specimens only), is one component of a comprehensive MRSA colonization surveillance program. It is not intended to diagnose MRSA infection nor to guide or monitor treatment for MRSA infections.   Clostridium Difficile by PCR     Status: Abnormal   Collection Time: 04/25/14  8:09 AM  Result Value Ref Range Status   C difficile by pcr POSITIVE (A) NEGATIVE Final    Comment: CRITICAL RESULT CALLED TO, READ BACK BY AND VERIFIED WITH: R.SPARKS,RN 04/25/14  BY V.WILKINS    Studies/Results: Dg Abd 1 View  04/27/2014   CLINICAL DATA:  Abdominal distention. Left lower quadrant abdominal swelling.  EXAM: ABDOMEN - 1 VIEW  COMPARISON:  None.  FINDINGS: There is no evidence of  free intra-abdominal air on this supine view. No dilated bowel loops to suggest obstruction. No radiopaque calculi. Vascular or seminal vesicle calcifications are seen in the pelvis. No acute osseous abnormalities are seen.  IMPRESSION: Nonobstructive bowel gas pattern.   Electronically Signed   By: Rubye Oaks M.D.   On: 04/27/2014 01:12   Echo: 04/25/14: Study Conclusions - Left ventricle: The cavity size was normal. There was moderate concentric hypertrophy. Systolic function was moderately reduced. The estimated ejection fraction was in the range of 35% to 40%. Moderate diffuse hypokinesis with no identifiable regional variations. Features are consistent with a pseudonormal left ventricular filling pattern, with concomitant abnormal relaxation and increased filling pressure  (grade 2 diastolic dysfunction). - Aortic valve: There was mild regurgitation. - Left atrium: The atrium was moderately dilated. - Atrial septum: No defect or patent foramen ovale was identified. - Pulmonary arteries: Systolic pressure was mildly increased. PA peak pressure: 36 mm Hg (S). - Pericardium, extracardiac: A small, free-flowing pericardial effusion was identified anterior to the heart. There was no evidence of hemodynamic compromise.  Medications: I have reviewed the patient's current medications. Scheduled Meds: . amLODipine  10 mg Oral QPM  . calcitRIOL  1 mcg Oral Q T,Th,Sa-HD  . carvedilol  25 mg Oral BID WC  . feeding supplement (NEPRO CARB STEADY)  237 mL Oral BID BM  . heparin  5,000 Units Subcutaneous 3 times per day  . hydrALAZINE  25 mg Oral 3 times per day  . isosorbide mononitrate  30 mg Oral Daily  . metroNIDAZOLE  500 mg Oral 3 times per day  . multivitamin  1 tablet Oral QHS  . sevelamer carbonate  2,400 mg Oral TID WC  . sodium chloride  3 mL Intravenous Q12H   Continuous Infusions:  PRN Meds:.hydrALAZINE, ipratropium-albuterol Assessment/Plan: Active Problems:   Volume overload   Acute pulmonary edema   ESRD (end stage renal disease)   Essential hypertension   Shortness of breath   Heart failure with reduced ejection fraction   Malnutrition of moderate degree  Austin Hardy is a 29 yo man with hypertension and ESRD who presented with shortness of breath likely secondary to pulmonary edema. On echo, he was found to have grade II diastolic dysfunction and an EF of 35-40%. There is doubt as to his outpatient compliance with HD and anti-hypertensive medications, and he admitted today that he does not take his home amlodipine.   Pulmonary Edema with ESRD: Shortness of breath was initially thought to be secondary to pulmonary edema vs HCAP. His initial CXR showed concern for edema but possible infection. He was taken to HD and subsequent CXR showed  significant improvement in aeration with resolved pulmonary edema but persistent evidence of resolving bronchopneumonia in the RML and RLL. Though he initially had a cough productive of clear sputum, these symptoms completely resolved with HD. Dr. Arlean Hopping (Nephrology) recommended extra HD session yesterday. His initial shortness of breath was most likely secondary to the pulmonary edema and his antibiotics have been stopped. He has been satting 95-100% on room air, is afebrile and his last WBC was 3.8. - Appreciate nephrology following; signing off - Cardiac monitoring - Renal diet - Changes to anti-hypertensive regimen (see below) - Daily weights - Duonebs q4hr PRN  Hypertension: Patient is on lisinopril 20 mg daily and Toprol XL 100 mg daily at home; he was not taking his home amlodipine. Unclear history of secondary hypertension as patient had hypertension since he was a teenager. Patient had  biopsy when he was young but is unaware of diagnosis, possibly nephrosclerosis? Patient can be considered for outpatient work up. Renal and cardiology recommendations vary; patient states that he will not take amlodipine.  - Discontinue Amlodipine 10 mg daily (patient will not take it due to perceived side effects) - Stopped Lisinopril 20 mg daily - Switched Labetalol 300 mg BID --> carvedilol 25 mg BID - Hydralazine 25 mg q8 hours added yesterday - Imdur 30 mg daily added today  Newly Diagnosed Combined Heart Failure: Echocardiogram 1/31 (ordered to assess cardiac status given long-standing hypertension) showed EF 35-40%, moderate concentric hypertrophy LV, moderate hypokinesis, small pericardial effusion, and grade II diastolic dysfunction. - Appreciate cardiology consult - Needs nuclear stress test as outpatient - Has appointment with Dr. Rennis Golden on 05/26/14  Clostridium Difficile: Presented with chronic diarrhea without fever, chills or abdominal pain. Found to be + for c diff. - Initiated metronidazole  500 mg TID on 1/31 (for 10 day total)  DVT Ppx: Heparin TID  Diet: Renal  Dispo: Disposition is deferred at this time, awaiting improvement of current medical problems.  Anticipated discharge in approximately 0 day(s).   The patient does have a current PCP [facility]) and does not need an [facility] hospital follow-up appointment after discharge.  The patient does not have transportation limitations that hinder transportation to clinic appointments.  .Services Needed at time of discharge: Y = Yes, Blank = No PT:   OT:   RN:   Equipment:   Other:     LOS: 3 days   Austin Ano, MD 04/27/2014, 7:09 AM

## 2014-04-27 NOTE — Progress Notes (Signed)
[facility] Progress Note  Assessment/Plan: 1. Pulmonary edema/PNA/SOB - repeat cXR shows improvement in volume status but persistent evidence of PNA in RML and RLL (not on antibiotics at present); HIV ab ordered 1/31 " in process" 2. ESRD -TTS - had HD Sat and Monday; uremic with hyperkalemia on admission - had a long history of noncompliance with HD coming maybe once a week, in Jan started coming twice a week but comes late and gets < 3 hours - saw Dr. Marisue Humble 1/19 and time ^ to 3 days a week 4 hour treatments; on 1/21 (outpt) he only ran 2 hr 40 min - Cr and K improved with dialysis here; - will have a lower EDW at d/c; advised that he will need to continue at his current HD center until he can be accepted at another center for dialysis.   3. Anemia - Hgb 11.9- no ESA  4. 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 HTN/volume - BP much improved with medication and HD - had net UF 4.3 on Saturday; He didn't have a post HD weight Saturday night and weight variances are large making them unreliable- BP so much improved here with medications, I suspect he was not taking meds as Rx at home, even though he says he is; EDW 80 - had been getting to about 81.5 with ^ BPs higher than hereUF 3.6 with post weight of 77.4  6. Nutrition ./weight loss- renal diet - add vitamin; Alb down to 2.7 here - had not qualified for ONSP at outpt center - can start at d/c 7. Dialysis and medical compliance - has been a HUGE issue for him at the outpt center; it is necessary for him to come on time in order to get a full treatment 8. C diff + on flagyl - had been taking doxy for a red rash on access (no notes in [place] about this) 9. Disp - ok with renal for d/c today -  Austin Slider, PA-C [facility] Beeper [phone] 04/27/2014,8:56 AM  LOS: 3 days    Renal Attending: BP elevation partly volume related and should improve with consistent full dialysis treatmens\ts and medication compliance.  He may benefit from an ACE/ARB if not allergic.  I doubt he will take a TID medication. Austin Hardy C   Subjective:   Wants to know how he got Cdiff and what skin problems are from. Wants to know why he is losing weight and why his dialysis center won't give him Nepro.  States he always takes binders (P is 11). Wants to change dialysis centers  Objective Filed Vitals:   04/26/14 1511 04/26/14 1554 04/26/14 1943 04/27/14 0500  BP: 162/107 158/107 170/110 148/100  Pulse: 85 86 93 81  Temp: 97.9 F (36.6 C) 98.1 F (36.7 C) 99.4 F (37.4 C) 98.5 F (36.9 C)  TempSrc: Oral Oral Oral Oral  Resp: 16 18 18 16   Height:      Weight: 77.4 kg (170 lb 10.2 oz)     SpO2:  99% 99% 95%   Physical Exam mother at bedside General:NAD, scabbed rash above right eyebrow Heart: RRR Lungs: no rales Abdomen: soft NT Extremities: no edema Skin: scattered pruritic nodular bumps on back and legs Dialysis  Access: left lower AVF + bruit  Dialysis Orders: HD: [facility] 4h 450/800 2/2.0 Bath 80kg Heparin 6000 RFA AVF Calcitriol 1.0 ug TIW  Additional Objective Labs: Basic Metabolic Panel:  Recent Labs Lab 04/24/14 0746 04/25/14 0440 04/26/14 0500  NA 135 137 137  K 5.7* 4.3 4.6  CL 92* 96 95*  CO2 GLUCOSE 120* 96 105*  BUN 104* 51* 71*  CREATININE 22.01* 14.83* 17.67*  CALCIUM 9.2 8.9 8.6  PHOS  --   --  11.0*   Liver Function Tests:  Recent Labs Lab 04/26/14 0500  ALBUMIN 2.7*   CBC:  Recent Labs Lab 04/24/14 0746 04/25/14 0440 04/26/14 0500  WBC 6.8 5.5 3.8*  NEUTROABS 4.7  --   --   HGB 13.6 13.4 11.9*  HCT 39.5 40.0 35.5*  MCV 88.0 98.3 98.9  PLT 254 182 169  Studies/Results: Dg Abd 1 View  04/27/2014   CLINICAL  DATA:  Abdominal distention. Left lower quadrant abdominal swelling.  EXAM: ABDOMEN - 1 VIEW  COMPARISON:  None.  FINDINGS: There is no evidence of free intra-abdominal air on this supine view. No dilated bowel loops to suggest obstruction. No radiopaque calculi. Vascular or seminal vesicle calcifications are seen in the pelvis. No acute osseous abnormalities are seen.  IMPRESSION: Nonobstructive bowel gas pattern.   Electronically Signed   By: Rubye Oaks M.D.   On: 04/27/2014 01:12   Medications:   . amLODipine  10 mg Oral QPM  . calcitRIOL  1 mcg Oral Q T,Th,Sa-HD  . carvedilol  25 mg Oral BID WC  . feeding supplement (NEPRO CARB STEADY)  237 mL Oral BID BM  . heparin  5,000 Units Subcutaneous 3 times per day  . hydrALAZINE  25 mg Oral 3 times per day  . isosorbide mononitrate  30 mg Oral Daily  . metroNIDAZOLE  500 mg Oral 3 times per day  . multivitamin  1 tablet Oral QHS  . sevelamer carbonate  2,400 mg Oral TID WC  . sodium chloride  3 mL Intravenous Q12H

## 2014-04-27 NOTE — Progress Notes (Signed)
Patient pending discharge today. Per discussion with Dr. Delton See yesterday, will increase his Imdur from 30mg  to 60mg . Per internal medicine and nephrology note, patient has compliance problem with medication and HD. I had a long discussion with the patient regarding the need for compliance with his medication. He admitted to IM team that he was not taking any of his previous amlodipine. I discussed with him the harm of long period of uncontrolled HTN has placed on his body and his heart.  I will arrange outpatient treadmill myoview stress test for him as part of his ischemic workup.   Austin Dial PA Pager: [phone]

## 2014-04-27 NOTE — Clinical Social Work Note (Signed)
Patient requested to talk with social worker regarding Medicare. CSW talked with patient and family members who were also in the room. Patient currently receives SSI and Medicaid. Patient strongly encouraged to go to [facility] administration and talk with them about Medicare. Patient is discharging home today and CSW signing off, however please reconsult if needed prior to patient's discharge.  Genelle Bal, MSW, LCSW Licensed Clinical Social Worker Clinical Social Work Department [employer] [phone]

## 2014-04-27 NOTE — Progress Notes (Signed)
Reviewed discharge instructions, appointments, medications. Pt VU, encouraged pt to call back if he has any questions.

## 2014-04-27 NOTE — Discharge Instructions (Signed)
It is very important for your heart and your kidneys that you control your blood pressure. This is a new medication plan (different from the amlodipine, metoprolol and lisinopril that you were supposed to be taking when you were admitted). This is what you received in the hospital over your last day (when your BP was 133/88).   Please take imdur 60 mg by mouth daily Please take carvedilol 25 mg by mouth twice per day Please take hydralazine 25 mg every 8 hours (three times per day)  Please call [facility] tomorrow to set up your follow-up appointment; they will manage your blood pressure medications to keep adjusting them as you need it.  You have a previously scheduled appointment on 05/26/14 with Dr. Rennis Golden. Please attend this appointment. The cardiology doctor has also set you up for an outpatient stress test.

## 2014-05-07 ENCOUNTER — Ambulatory Visit (HOSPITAL_COMMUNITY): Payer: Medicaid Other | Attending: Cardiovascular Disease | Admitting: Radiology

## 2014-05-07 ENCOUNTER — Encounter (HOSPITAL_COMMUNITY): Payer: Self-pay | Admitting: Radiology

## 2014-05-07 DIAGNOSIS — I120 Hypertensive chronic kidney disease with stage 5 chronic kidney disease or end stage renal disease: Secondary | ICD-10-CM | POA: Insufficient documentation

## 2014-05-07 DIAGNOSIS — I519 Heart disease, unspecified: Secondary | ICD-10-CM

## 2014-05-07 DIAGNOSIS — R0609 Other forms of dyspnea: Secondary | ICD-10-CM | POA: Insufficient documentation

## 2014-05-07 DIAGNOSIS — N186 End stage renal disease: Secondary | ICD-10-CM | POA: Diagnosis not present

## 2014-05-07 DIAGNOSIS — R9439 Abnormal result of other cardiovascular function study: Secondary | ICD-10-CM | POA: Insufficient documentation

## 2014-05-07 MED ORDER — REGADENOSON 0.4 MG/5ML IV SOLN
0.4000 mg | Freq: Once | INTRAVENOUS | Status: AC
  Administered 2014-05-07: 0.4 mg via INTRAVENOUS

## 2014-05-07 MED ORDER — TECHNETIUM TC 99M SESTAMIBI GENERIC - CARDIOLITE
33.0000 | Freq: Once | INTRAVENOUS | Status: AC | PRN
  Administered 2014-05-07: 33 via INTRAVENOUS

## 2014-05-07 MED ORDER — TECHNETIUM TC 99M SESTAMIBI GENERIC - CARDIOLITE
11.0000 | Freq: Once | INTRAVENOUS | Status: AC | PRN
  Administered 2014-05-07: 11 via INTRAVENOUS

## 2014-05-07 NOTE — Progress Notes (Signed)
[facility] 3 NUCLEAR MED [address] [place], [place] [address] [phone]    Cardiology Nuclear Med Study  Austin Hardy is a 29 y.o. male     MRN : [id number]     DOB: 1985/09/15  Procedure Date: 05/07/2014  Nuclear Med Background Indication for Stress Test:  Evaluation for Ischemia, post hospital (04/07/14) Dyspnea  History:  ESRD Cardiac Risk Factors: Hypertension  Symptoms:  DOE   Nuclear Pre-Procedure Caffeine/Decaff Intake:  None NPO After: 9:00am   Lungs:  clear O2 Sat: 100% on room air. IV 0.9% NS with Angio Cath:  22g  IV Site: L Antecubital  IV Started by:  Kerby Nora, CNMT  Chest Size (in):  48 Cup Size: n/a  Height:    Weight:  170 lb (77.111 kg)  BMI:  Body mass index is 23.72 kg/(m^2). Tech Comments: Switched to [employer] due to elevated BP per Dr. Tenny Craw    Nuclear Med Study 1 or 2 day study: 1 day  Stress Test Type:  Eugenie Birks  Reading MD: Marca Ancona, MD  Order Authorizing Provider:  Harrell Lark, MD  Resting Radionuclide: Technetium 49m Sestamibi  Resting Radionuclide Dose: 11.0 mCi   Stress Radionuclide:  Technetium 80m Sestamibi  Stress Radionuclide Dose: 33.0 mCi           Stress Protocol Rest HR: 96 Stress HR: 114  Rest BP: 174/130 Stress BP: 161/97  Exercise Time (min): n/a METS: n/a   Predicted Max HR: 191 bpm % Max HR: 59.69 bpm Rate Pressure Product: 23414   Dose of Adenosine (mg):  n/a Dose of Lexiscan: 0.4 mg  Dose of Atropine (mg): n/a Dose of Dobutamine: n/a mcg/kg/min (at max HR)  Stress Test Technologist: Frederick Peers, EMT-P  Nuclear Technologist:  Kerby Nora, CNMT     Rest Procedure:  Myocardial perfusion imaging was performed at rest 45 minutes following the intravenous administration of Technetium 71m Sestamibi. Rest ECG: NSR with non-specific ST-T wave changes  Stress Procedure:  The patient received IV Lexiscan 0.4 mg over 15-seconds.  Technetium 7m Sestamibi injected at 30-seconds.  Quantitative  spect images were obtained after a 45 minute delay. Stress ECG: No significant change from baseline ECG  QPS Raw Data Images:  Normal; no motion artifact; normal heart/lung ratio. Stress Images:  Normal homogeneous uptake in all areas of the myocardium. Rest Images:  Normal homogeneous uptake in all areas of the myocardium. Subtraction (SDS):  No evidence of ischemia. Transient Ischemic Dilatation (Normal <1.22):  0.95 Lung/Heart Ratio (Normal <0.45):  0.39  Quantitative Gated Spect Images QGS EDV:  305 ml QGS ESV:  209 ml  Impression Exercise Capacity:  Lexiscan with no exercise. BP Response:  Normal blood pressure response. Clinical Symptoms:  No significant symptoms noted. ECG Impression:  No significant ST segment change suggestive of ischemia. Comparison with Prior Nuclear Study: No previous nuclear study performed  Overall Impression:  Abnormal study.  No evidence of ischemia. There is marked dilatation of the LV cavity with global hypokinesis and EF 32%. 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  LV Ejection Fraction: 32%.  LV Wall Motion:  Global hypokinesis.  Cassell Clement MD

## 2014-05-17 ENCOUNTER — Telehealth: Payer: Self-pay | Admitting: Internal Medicine

## 2014-05-17 NOTE — Telephone Encounter (Signed)
Pt would like stress test results from 05-07-14 please.

## 2014-05-17 NOTE — Telephone Encounter (Signed)
Returned call to patient. Informed him that stress test has not been resulted on at this time, but he will need follow up of this test. Patient was scheduled to see Dr. Rennis Hardy 3/2 as new patient but appointment was canceled, and by patient's reaction it appears as if he was not the one who canceled this appointment. Patient will be rescheduled as new patient with Dr. Rennis Hardy Friday March 11th @ 830am. Patient was offered choice of provider.   Patient also states he is trying on obtain PCP and reported he had the name of a provider near [facility] - Dr. Senaida Hardy.   In regards to stress test, this message will be routed to MD who read stress test, Dr. Patty Hardy, to provide result note to be communicated to patient.

## 2014-05-18 NOTE — Telephone Encounter (Signed)
Please report. Stress test shows no ischemia. There is a dilated LV with low EF 32% c/w nonischemic cardiomyopathy. He can discuss further with Dr. Rennis Golden at his upcoming new patient OV

## 2014-05-19 NOTE — Telephone Encounter (Signed)
Advised patient and reminded of consult with Dr Rennis Golden

## 2014-05-26 ENCOUNTER — Ambulatory Visit: Payer: Medicaid Other | Admitting: Internal Medicine

## 2014-06-04 ENCOUNTER — Ambulatory Visit (INDEPENDENT_AMBULATORY_CARE_PROVIDER_SITE_OTHER): Payer: Medicaid Other | Admitting: Internal Medicine

## 2014-06-04 ENCOUNTER — Encounter: Payer: Self-pay | Admitting: Internal Medicine

## 2014-06-04 VITALS — BP 140/100 | HR 78 | Ht 71.0 in | Wt 189.8 lb

## 2014-06-04 DIAGNOSIS — I10 Essential (primary) hypertension: Secondary | ICD-10-CM | POA: Diagnosis not present

## 2014-06-04 DIAGNOSIS — I429 Cardiomyopathy, unspecified: Secondary | ICD-10-CM

## 2014-06-04 DIAGNOSIS — I428 Other cardiomyopathies: Secondary | ICD-10-CM

## 2014-06-04 DIAGNOSIS — I5020 Unspecified systolic (congestive) heart failure: Secondary | ICD-10-CM

## 2014-06-04 DIAGNOSIS — I509 Heart failure, unspecified: Secondary | ICD-10-CM | POA: Diagnosis not present

## 2014-06-04 DIAGNOSIS — N186 End stage renal disease: Secondary | ICD-10-CM | POA: Diagnosis not present

## 2014-06-04 DIAGNOSIS — J810 Acute pulmonary edema: Secondary | ICD-10-CM

## 2014-06-04 DIAGNOSIS — I5022 Chronic systolic (congestive) heart failure: Secondary | ICD-10-CM | POA: Diagnosis not present

## 2014-06-04 NOTE — Patient Instructions (Signed)
Your physician wants you to follow-up in: 6 Months after Echo You will receive a reminder letter in the mail two months in advance. If you don't receive a letter, please call our office to schedule the follow-up appointment.  Your physician has requested that you have an echocardiogram. Echocardiography is a painless test that uses sound waves to create images of your heart. It provides your doctor with information about the size and shape of your heart and how well your heart's chambers and valves are working. This procedure takes approximately one hour. There are no restrictions for this procedure.6 Months

## 2014-06-07 ENCOUNTER — Encounter: Payer: Self-pay | Admitting: Internal Medicine

## 2014-06-07 NOTE — Progress Notes (Signed)
OFFICE NOTE  Chief Complaint:  Hospital follow-up  Primary Care Physician: No primary care provider on file.  HPI:  Austin Hardy is a 29 year old African-American male with past medical history of hypertension and ESRD on HD T/Th/Sat, however no prior cardiac history. He has been on hemodialysis via right upper extremity AV fistula for the last 3 years. He denies any significant family history of CAD or renal problem, however does endorse significant history of hypertension running on both side of the family. He states he had a treadmill stress test in [place] in 2014 which was normal. This record is not available to [place] at this time. He denies any other prior cardiac workup. He does use occasional marijuana, however denies any cocaine use, the last use was 4 weeks ago. He states he has been having trouble getting his blood pressure under control even though he has been compliant with his medication. His medication was recently adjusted, however he started having increasing shortness of breath for the last 2 weeks associated with orthopnea and paroxysmal nocturnal dyspnea. He denies any significant lower extremity edema. He also endorsed a chronic diarrhea. Prior to that, he has been very active playing basketball. He denies any exertional chest pain nor has he ever had any chest pain before. On arrival at [facility], significant laboratory finding include potassium of 5.7, creatinine 2.01, BNP elevated at 3984, negative troponin. Initial chest x-ray showed stable mild pulmonary edema seen in left lung base, increased right lower lobe opacity concerning for pneumonia or edema. EKG showed mild sinus tachycardia, lateral T-wave inversion, poor R wave progression in anterior leads. Patient was treated with antibiotics for potential pneumonia. He underwent urgent hemodialysis due to worsening repeat chest x-ray. Nephrology was consulted to help with hemodialysis. His blood pressure on arrival was also  noted to be elevated at 188/135. His Toprol-XL was switched to labetalol to provide better blood pressure control along with lisinopril and amlodipine. C. difficile panel came back positive for C. difficile infection. He was started on Flagyl. An echocardiogram was obtained on 04/26/2014 which showed EF 35-40%, grade 2 diastolic dysfunction, PA peak pressure 36, mild AR. Cardiology has been consulted for heart failure and the newly diagnosed LV dysfunction.  He had medication adjustments and was discharged. Since follow-up he reports improvement in his breathing. He denies any chest pain. Blood pressure remains elevated today 140/100. He did undergo a nuclear stress test on 05/07/2014, which showed a dilated cardiomyopathy with EF of 32% and normal perfusion.  PMHx:  Past Medical History  Diagnosis Date  . Hypertension   . Chronic kidney disease     Past Surgical History  Procedure Laterality Date  . Fracture surgery Left 2005    Ankle  . Av fistula placement Right March 2013    FAMHx:  Family History  Problem Relation Age of Onset  . Diabetes Mother   . Hypertension Mother   . Diabetes Father   . Hypertension Father     SOCHx:   reports that he quit smoking about 11 years ago. He has never used smokeless tobacco. He reports that he uses illicit drugs. He reports that he does not drink alcohol.  ALLERGIES:  No Known Allergies  ROS: A comprehensive review of systems was negative.  HOME MEDS: Current Outpatient Prescriptions  Medication Sig Dispense Refill  . carvedilol (COREG) 25 MG tablet Take 1 tablet (25 mg total) by mouth 2 (two) times daily with a meal. 60 tablet 0  .  hydrALAZINE (APRESOLINE) 25 MG tablet Take 1 tablet (25 mg total) by mouth 3 (three) times daily. 90 tablet 0  . isosorbide mononitrate (IMDUR) 30 MG 24 hr tablet Take 2 tablets (60 mg total) by mouth daily. 30 tablet 0  . sevelamer carbonate (RENVELA) 800 MG tablet Take 2,400 mg by mouth 3 (three) times  daily with meals. Three tab.per meal     No current facility-administered medications for this visit.    LABS/IMAGING: No results found for this or any previous visit (from the past 48 hour(s)). No results found.  VITALS: BP 140/100 mmHg  Pulse 78  Ht 5\' 11"  (1.803 m)  Wt 189 lb 12.8 oz (86.093 kg)  BMI 26.48 kg/m2  EXAM: General appearance: alert and no distress Neck: no carotid bruit and no JVD Lungs: clear to auscultation bilaterally Heart: regular rate and rhythm, S1, S2 normal, no murmur, click, rub or gallop Abdomen: soft, non-tender; bowel sounds normal; no masses,  no organomegaly Extremities: extremities normal, atraumatic, no cyanosis or edema Pulses: 2+ and symmetric Skin: Numerous tattoos, right forearm fistula Neurologic: Grossly normal Psych: Pleasant  EKG: Normal sinus rhythm at 78, lateral T-wave abnormalities  ASSESSMENT: 1. 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 End-stage renal disease on hemodialysis  PLAN: 1.   He currently appears to be on appropriate medications for his heart failure. Given his significant renal dysfunction and history of hyperkalemia, he is not a candidate for ACE inhibitor and also not likely a candidate for Entresto. I suspect he has hypertensive cardiomyopathy, which is the same etiology for his renal failure. Currently appears euvolemic. Volume status is managed at dialysis. I recommend continuing his current medications and we will plan a recheck echocardiogram in 6 months. If his EF is not improved with the recent changes in his medications, he should be referred for evaluation of an AICD. I briefly discussed that with him today and he understands the importance of AICD's for primary prevention of sudden cardiac death.  Follow-up in 6 months.  Chrystie Nose, MD, [facility] Attending  Cardiologist [facility]  Javaya Oregon C 06/07/2014, 7:14 PM

## 2014-07-07 ENCOUNTER — Emergency Department (HOSPITAL_COMMUNITY): Payer: Medicaid Other

## 2014-07-07 ENCOUNTER — Encounter (HOSPITAL_COMMUNITY): Payer: Self-pay | Admitting: Emergency Medicine

## 2014-07-07 ENCOUNTER — Emergency Department (HOSPITAL_COMMUNITY)
Admission: EM | Admit: 2014-07-07 | Discharge: 2014-07-07 | Disposition: A | Discharge status: Home / Self Care | Payer: Medicaid Other | Attending: Emergency Medicine | Admitting: Emergency Medicine

## 2014-07-07 DIAGNOSIS — I129 Hypertensive chronic kidney disease with stage 1 through stage 4 chronic kidney disease, or unspecified chronic kidney disease: Secondary | ICD-10-CM | POA: Diagnosis not present

## 2014-07-07 DIAGNOSIS — N189 Chronic kidney disease, unspecified: Secondary | ICD-10-CM | POA: Diagnosis not present

## 2014-07-07 DIAGNOSIS — Z87891 Personal history of nicotine dependence: Secondary | ICD-10-CM | POA: Insufficient documentation

## 2014-07-07 DIAGNOSIS — R0602 Shortness of breath: Secondary | ICD-10-CM | POA: Diagnosis not present

## 2014-07-07 DIAGNOSIS — Z79899 Other long term (current) drug therapy: Secondary | ICD-10-CM | POA: Insufficient documentation

## 2014-07-07 HISTORY — DX: Cardiomegaly: I51.7

## 2014-07-07 LAB — CBC WITH DIFFERENTIAL/PLATELET
BASOS PCT: 1 % (ref 0–1)
Basophils Absolute: 0 10*3/uL (ref 0.0–0.1)
Eosinophils Absolute: 0.2 10*3/uL (ref 0.0–0.7)
Eosinophils Relative: 4 % (ref 0–5)
HCT: 25.3 % — ABNORMAL LOW (ref 39.0–52.0)
HEMOGLOBIN: 8.4 g/dL — AB (ref 13.0–17.0)
LYMPHS PCT: 30 % (ref 12–46)
Lymphs Abs: 1.8 10*3/uL (ref 0.7–4.0)
MCH: 33.3 pg (ref 26.0–34.0)
MCHC: 33.2 g/dL (ref 30.0–36.0)
MCV: 100.4 fL — ABNORMAL HIGH (ref 78.0–100.0)
Monocytes Absolute: 0.6 10*3/uL (ref 0.1–1.0)
Monocytes Relative: 10 % (ref 3–12)
NEUTROS PCT: 55 % (ref 43–77)
Neutro Abs: 3.3 10*3/uL (ref 1.7–7.7)
PLATELETS: 184 10*3/uL (ref 150–400)
RBC: 2.52 MIL/uL — AB (ref 4.22–5.81)
RDW: 17.4 % — AB (ref 11.5–15.5)
WBC: 6 10*3/uL (ref 4.0–10.5)

## 2014-07-07 LAB — BASIC METABOLIC PANEL
Anion gap: 17 — ABNORMAL HIGH (ref 5–15)
BUN: 78 mg/dL — ABNORMAL HIGH (ref 6–23)
CHLORIDE: 97 mmol/L (ref 96–112)
CO2: 22 mmol/L (ref 19–32)
CREATININE: 18.19 mg/dL — AB (ref 0.50–1.35)
Calcium: 9.2 mg/dL (ref 8.4–10.5)
GFR calc non Af Amer: 3 mL/min — ABNORMAL LOW (ref >90)
GFR, EST AFRICAN AMERICAN: 3 mL/min — AB (ref >90)
Glucose, Bld: 108 mg/dL — ABNORMAL HIGH (ref 70–99)
Potassium: 5.9 mmol/L — ABNORMAL HIGH (ref 3.5–5.1)
Sodium: 136 mmol/L (ref 135–145)

## 2014-07-07 LAB — BRAIN NATRIURETIC PEPTIDE: B Natriuretic Peptide: 879.9 pg/mL — ABNORMAL HIGH (ref 0.0–100.0)

## 2014-07-07 MED ORDER — ALBUTEROL SULFATE (2.5 MG/3ML) 0.083% IN NEBU
2.5000 mg | INHALATION_SOLUTION | RESPIRATORY_TRACT | Status: DC | PRN
  Administered 2014-07-07: 2.5 mg via RESPIRATORY_TRACT
  Filled 2014-07-07: qty 3

## 2014-07-07 NOTE — ED Provider Notes (Signed)
CSN: [id number]     Arrival date & time 07/07/14  4098 History   First MD Initiated Contact with Patient 07/07/14 [phone]     Chief Complaint  Patient presents with  . Shortness of Breath  . Cough  . needs dialysis       HPI  Impression presents evaluation regarding his renal disease. Patient recently moved here from out of town. His been going to [place] dialysis center. He was hopefully going to be transferred for his dialysis over to [facility]. He presented there yesterday. He states "they wouldn't take me". I did call and speak with the charge nurse there. She stated that the medical director there had declined him because of his history of noncompliance. She stated however that they had spoken with [place] and that he has continued access to dialysis there.  She denies shortness of breath chest pain weakness dizziness. He states he feels short of breath and has a cough and is wheezing. Has not used his albuterol inhaler today as "I don't have one".  Past Medical History  Diagnosis Date  . Hypertension   . Chronic kidney disease   . Cardiomegaly    Past Surgical History  Procedure Laterality Date  . Fracture surgery Left 2005    Ankle  . Av fistula placement Right March 2013   Family History  Problem Relation Age of Onset  . Diabetes Mother   . Hypertension Mother   . Diabetes Father   . Hypertension Father    History  Substance Use Topics  . Smoking status: Former Smoker -- 0.50 packs/day    Quit date: 03/27/2003  . Smokeless tobacco: Never Used  . Alcohol Use: No    Review of Systems  Constitutional: Negative for fever, chills, diaphoresis, appetite change and fatigue.  HENT: Negative for mouth sores, sore throat and trouble swallowing.   Eyes: Negative for visual disturbance.  Respiratory: Positive for wheezing. Negative for cough, chest tightness and shortness of breath.   Cardiovascular: Negative for chest pain.  Gastrointestinal: Negative for nausea,  vomiting, abdominal pain, diarrhea and abdominal distention.  Endocrine: Negative for polydipsia, polyphagia and polyuria.  Genitourinary: Negative for dysuria, frequency and hematuria.  Musculoskeletal: Negative for gait problem.  Skin: Negative for color change, pallor and rash.  Neurological: Negative for dizziness, syncope, light-headedness and headaches.  Hematological: Does not bruise/bleed easily.  Psychiatric/Behavioral: Negative for behavioral problems and confusion.      Allergies  Review of patient's allergies indicates no known allergies.  Home Medications   Prior to Admission medications   Medication Sig Start Date End Date Taking? Authorizing Provider  carvedilol (COREG) 25 MG tablet Take 1 tablet (25 mg total) by mouth 2 (two) times daily with a meal. 04/27/14  Yes Stark Bray, MD  hydrALAZINE (APRESOLINE) 25 MG tablet Take 1 tablet (25 mg total) by mouth 3 (three) times daily. 04/27/14  Yes Stark Bray, MD  isosorbide mononitrate (IMDUR) 30 MG 24 hr tablet Take 2 tablets (60 mg total) by mouth daily. 04/27/14  Yes Stark Bray, MD  ketotifen (ZADITOR) 0.025 % ophthalmic solution Place 5 drops into both eyes daily as needed (cut in the eye).   Yes Historical Provider, MD  sevelamer (RENAGEL) 800 MG tablet Take 2,400 mg by mouth 4 (four) times daily -  with meals and at bedtime.   Yes Historical Provider, MD  sevelamer carbonate (RENVELA) 800 MG tablet Take 2,400 mg by mouth 3 (three) times daily with meals. Three tab.per  meal   Yes Historical Provider, MD   BP 170/110 mmHg  Pulse 90  Temp(Src) 98.5 F (36.9 C) (Oral)  Resp 20  Wt 198 lb (89.812 kg)  SpO2 100% Physical Exam  Constitutional: He is oriented to person, place, and time. He appears well-developed and well-nourished. No distress.  HENT:  Head: Normocephalic.  Eyes: Conjunctivae are normal. Pupils are equal, round, and reactive to light. No scleral icterus.  Neck: Normal range of motion. Neck supple.  No thyromegaly present.  Cardiovascular: Normal rate and regular rhythm.  Exam reveals no gallop and no friction rub.   No murmur heard. Pulmonary/Chest: Effort normal and breath sounds normal. No respiratory distress. He has no wheezes. He has no rales.  Faint end expiratory wheeze on exam.  Abdominal: Soft. Bowel sounds are normal. He exhibits no distension. There is no tenderness. There is no rebound.  Musculoskeletal: Normal range of motion.  Neurological: He is alert and oriented to person, place, and time.  Skin: Skin is warm and dry. No rash noted.  Psychiatric: He has a normal mood and affect. His behavior is normal.    ED Course  Procedures (including critical care time) Labs Review Labs Reviewed  CBC WITH DIFFERENTIAL/PLATELET - Abnormal; Notable for the following:    RBC 2.52 (*)    Hemoglobin 8.4 (*)    HCT 25.3 (*)    MCV 100.4 (*)    RDW 17.4 (*)    All other components within normal limits  BASIC METABOLIC PANEL - Abnormal; Notable for the following:    Potassium 5.9 (*)    Glucose, Bld 108 (*)    BUN 78 (*)    Creatinine, Ser 18.19 (*)    GFR calc non Af Amer 3 (*)    GFR calc Af Amer 3 (*)    Anion gap 17 (*)    All other components within normal limits  BRAIN NATRIURETIC PEPTIDE - Abnormal; Notable for the following:    B Natriuretic Peptide 879.9 (*)    All other components within normal limits    Imaging Review Dg Chest 2 View  07/07/2014   CLINICAL DATA:  Cough, shortness of Breath  EXAM: CHEST  2 VIEW  COMPARISON:  04/25/2014  FINDINGS: Cardiomegaly is noted. No acute infiltrate or pleural effusion. No pulmonary edema. Bony thorax is stable.  IMPRESSION: Cardiomegaly.  No acute infiltrate or pulmonary edema.   Electronically Signed   By: Natasha Mead M.D.   On: 07/07/2014 09:01     EKG Interpretation None      MDM   Final diagnoses:  SOB (shortness of breath)   She given nebulized up at all treatment on reexam is clear with no wheezing noted.  Well oxygenated 98% on room air.   I discussed the case with Dr. Hyman Hopes on for nephrology. Spoke with the medical director at the patient's nephrology/dialysis center at [place]. He then called me back and stated that the patient "has a chair". And requested the patient be discharged "as soon as possible "further for his dialysis. Patient is not in congestive heart failure hyperkalemic here. I discussed this at length with the patient he states he will go directly to dialysis center for his dialysis run today. He will have an abbreviated session today, then resume his Tuesday, Thursday, Saturday tomorrow.    Rolland Porter, MD 07/09/14 [phone]

## 2014-07-07 NOTE — ED Notes (Addendum)
To ed via private vehicle-- states has not been to dialysis since Saturday-- in the process of changing centers (old center-- [place]) went to new center and was told unable to transfer. Now having shortness of breath, continued dry cough. Wheezing-

## 2014-07-07 NOTE — Discharge Instructions (Signed)
Go immediately to your dialysis  Follow up with your nephrologist and your primary care physician regarding your anemia.

## 2015-05-28 ENCOUNTER — Emergency Department (HOSPITAL_BASED_OUTPATIENT_CLINIC_OR_DEPARTMENT_OTHER): Payer: Medicaid Other

## 2015-05-28 ENCOUNTER — Encounter (HOSPITAL_BASED_OUTPATIENT_CLINIC_OR_DEPARTMENT_OTHER): Payer: Self-pay

## 2015-05-28 ENCOUNTER — Emergency Department (HOSPITAL_BASED_OUTPATIENT_CLINIC_OR_DEPARTMENT_OTHER)
Admission: EM | Admit: 2015-05-28 | Discharge: 2015-05-28 | Disposition: A | Discharge status: Home / Self Care | Payer: Medicaid Other | Attending: Emergency Medicine | Admitting: Emergency Medicine

## 2015-05-28 DIAGNOSIS — S8992XA Unspecified injury of left lower leg, initial encounter: Secondary | ICD-10-CM | POA: Diagnosis not present

## 2015-05-28 DIAGNOSIS — I120 Hypertensive chronic kidney disease with stage 5 chronic kidney disease or end stage renal disease: Secondary | ICD-10-CM | POA: Insufficient documentation

## 2015-05-28 DIAGNOSIS — Y9389 Activity, other specified: Secondary | ICD-10-CM | POA: Insufficient documentation

## 2015-05-28 DIAGNOSIS — Z87891 Personal history of nicotine dependence: Secondary | ICD-10-CM | POA: Diagnosis not present

## 2015-05-28 DIAGNOSIS — S0083XA Contusion of other part of head, initial encounter: Secondary | ICD-10-CM | POA: Diagnosis not present

## 2015-05-28 DIAGNOSIS — Z992 Dependence on renal dialysis: Secondary | ICD-10-CM | POA: Diagnosis not present

## 2015-05-28 DIAGNOSIS — Y998 Other external cause status: Secondary | ICD-10-CM | POA: Insufficient documentation

## 2015-05-28 DIAGNOSIS — Z79899 Other long term (current) drug therapy: Secondary | ICD-10-CM | POA: Insufficient documentation

## 2015-05-28 DIAGNOSIS — S8991XA Unspecified injury of right lower leg, initial encounter: Secondary | ICD-10-CM | POA: Diagnosis present

## 2015-05-28 DIAGNOSIS — Y9289 Other specified places as the place of occurrence of the external cause: Secondary | ICD-10-CM | POA: Diagnosis not present

## 2015-05-28 DIAGNOSIS — S83421A Sprain of lateral collateral ligament of right knee, initial encounter: Secondary | ICD-10-CM | POA: Diagnosis not present

## 2015-05-28 DIAGNOSIS — N186 End stage renal disease: Secondary | ICD-10-CM | POA: Diagnosis not present

## 2015-05-28 DIAGNOSIS — S6991XA Unspecified injury of right wrist, hand and finger(s), initial encounter: Secondary | ICD-10-CM | POA: Diagnosis not present

## 2015-05-28 MED ORDER — IBUPROFEN 800 MG PO TABS: 800.0000 mg | ORAL_TABLET | Freq: Three times a day (TID) | ORAL | Status: DC

## 2015-05-28 MED ORDER — TRAMADOL HCL 50 MG PO TABS: 50.0000 mg | ORAL_TABLET | Freq: Four times a day (QID) | ORAL | Status: DC | PRN

## 2015-05-28 NOTE — ED Notes (Signed)
Pt transported to CT/XR 
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
## 2015-05-28 NOTE — Discharge Instructions (Signed)
Contusion A contusion is a deep bruise. Contusions happen when an injury causes bleeding under the skin. Symptoms of bruising include pain, swelling, and discolored skin. The skin may turn blue, purple, or yellow. HOME CARE   Rest the injured area.  If told, put ice on the injured area.  Put ice in a plastic bag.  Place a towel between your skin and the bag.  Leave the ice on for 20 minutes, 2-3 times per day.  If told, put light pressure (compression) on the injured area using an elastic bandage. Make sure the bandage is not too tight. Remove it and put it back on as told by your doctor.  If possible, raise (elevate) the injured area above the level of your heart while you are sitting or lying down.  Take over-the-counter and prescription medicines only as told by your doctor. GET HELP IF:  Your symptoms do not get better after several days of treatment.  Your symptoms get worse.  You have trouble moving the injured area. GET HELP RIGHT AWAY IF:   You have very bad pain.  You have a loss of feeling (numbness) in a hand or foot.  Your hand or foot turns pale or cold.   This information is not intended to replace advice given to you by your health care provider. Make sure you discuss any questions you have with your health care provider.   Document Released: 08/29/2007 Document Revised: 12/01/2014 Document Reviewed: 07/28/2014 Elsevier Interactive Patient Education 2016 [employer]  Knee Sprain A knee sprain is a tear in the strong bands of tissue that connect the bones (ligaments) of your knee. HOME CARE  Raise (elevate) your injured knee to lessen puffiness (swelling).  To ease pain and puffiness, put ice on the injured area.  Put ice in a plastic bag.  Place a towel between your skin and the bag.  Leave the ice on for 20 minutes, 2-3 times a day.  Only take medicine as told by your doctor.  Do not leave your knee unprotected until pain and stiffness go away  (usually 4-6 weeks).  If you have a cast or splint, do not get it wet. If your doctor told you to not take it off, cover it with a plastic bag when you shower or bathe. Do not swim.  Your doctor may have you do exercises to prevent or limit permanent weakness and stiffness. GET HELP RIGHT AWAY IF:   Your cast or splint becomes damaged.  Your pain gets worse.  You have a lot of pain, puffiness, or numbness below the cast or splint. MAKE SURE YOU:   Understand these instructions.  Will watch your condition.  Will get help right away if you are not doing well or get worse.   This information is not intended to replace advice given to you by your health care provider. Make sure you discuss any questions you have with your health care provider.   Document Released: 02/28/2009 Document Revised: 03/17/2013 Document Reviewed: 11/18/2012 Elsevier Interactive Patient Education [employer]
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
## 2016-04-12 IMAGING — CR DG CHEST 2V
2 series · 2 of 2 positions shown · non-contrast
Comparison: 03/02/2014.

CLINICAL DATA: Shortness of breath.

EXAM:
CHEST  2 VIEW

[chest pa]
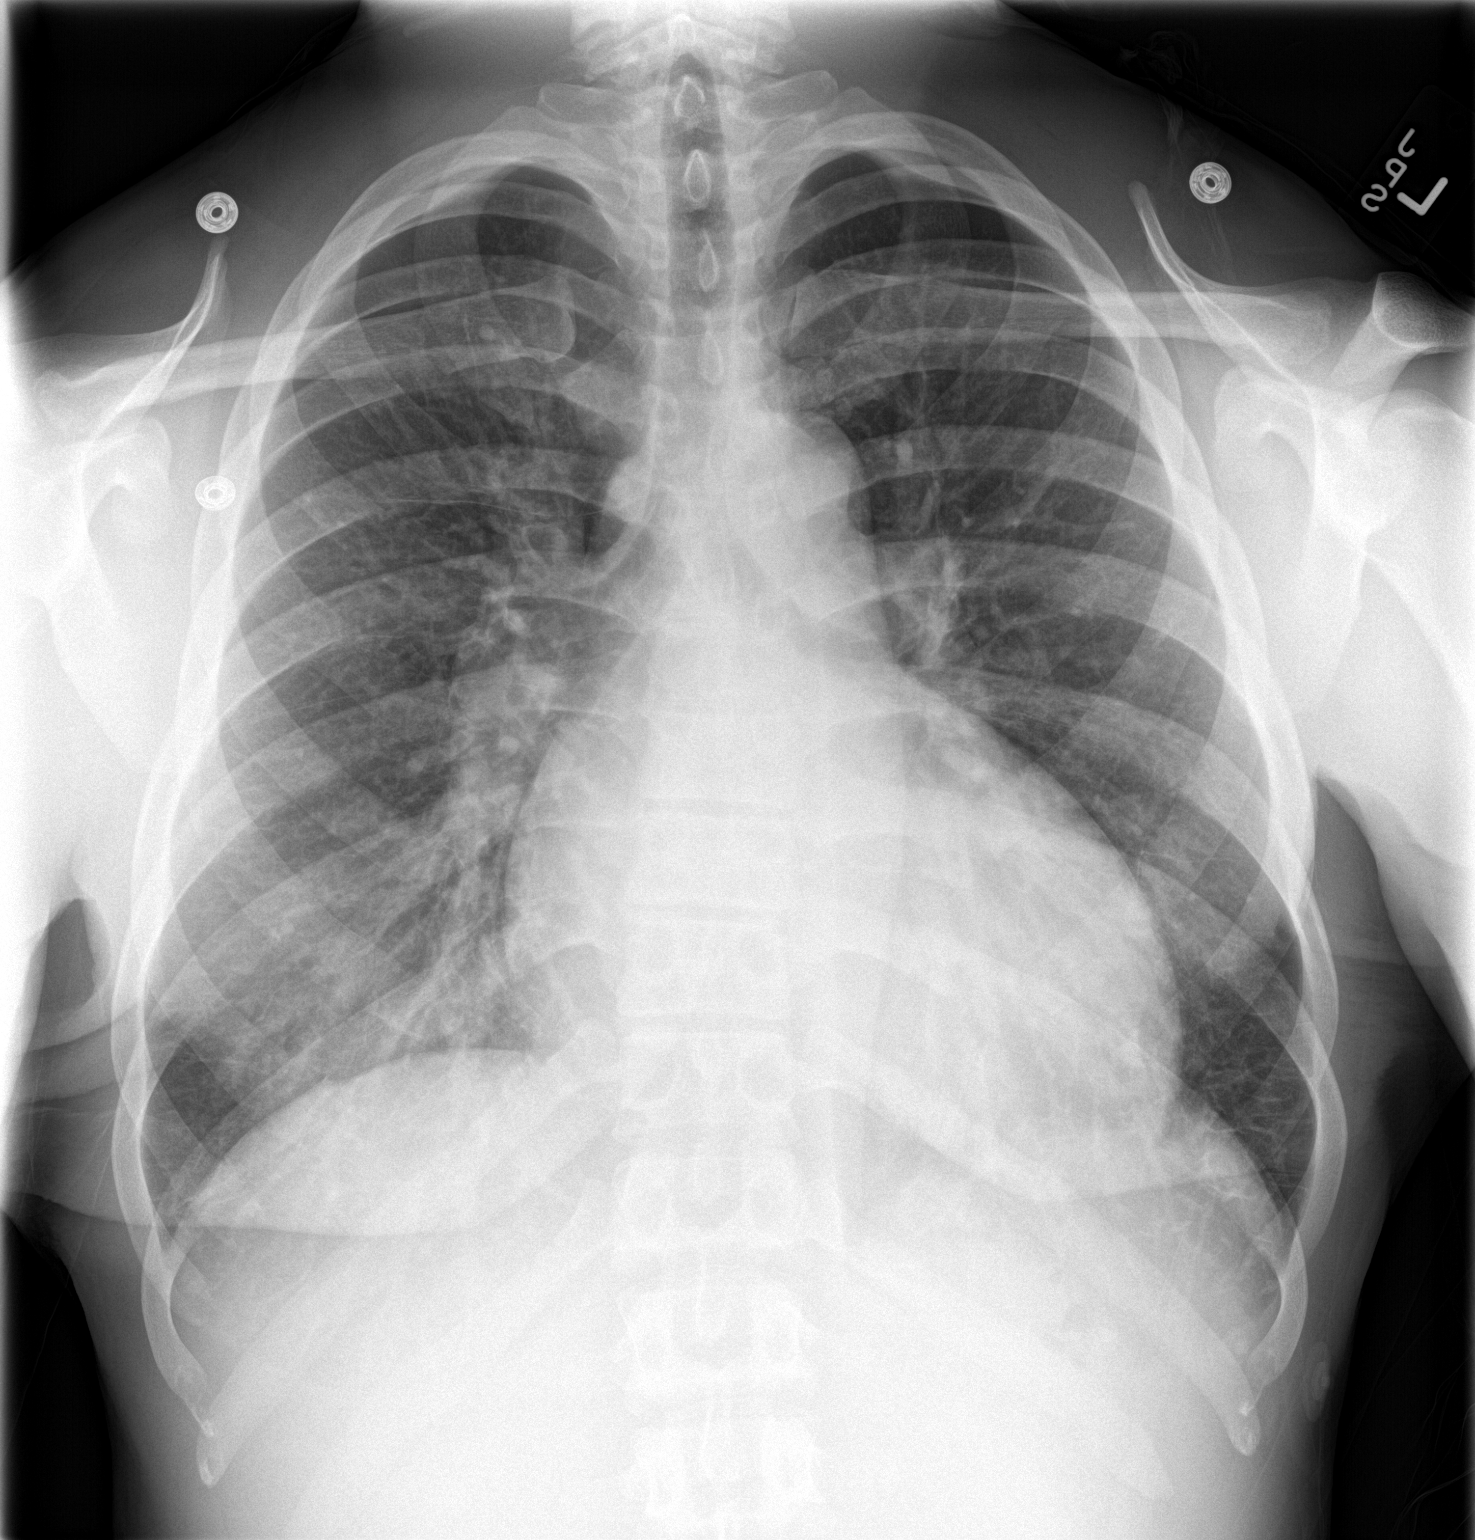

[chest lat]
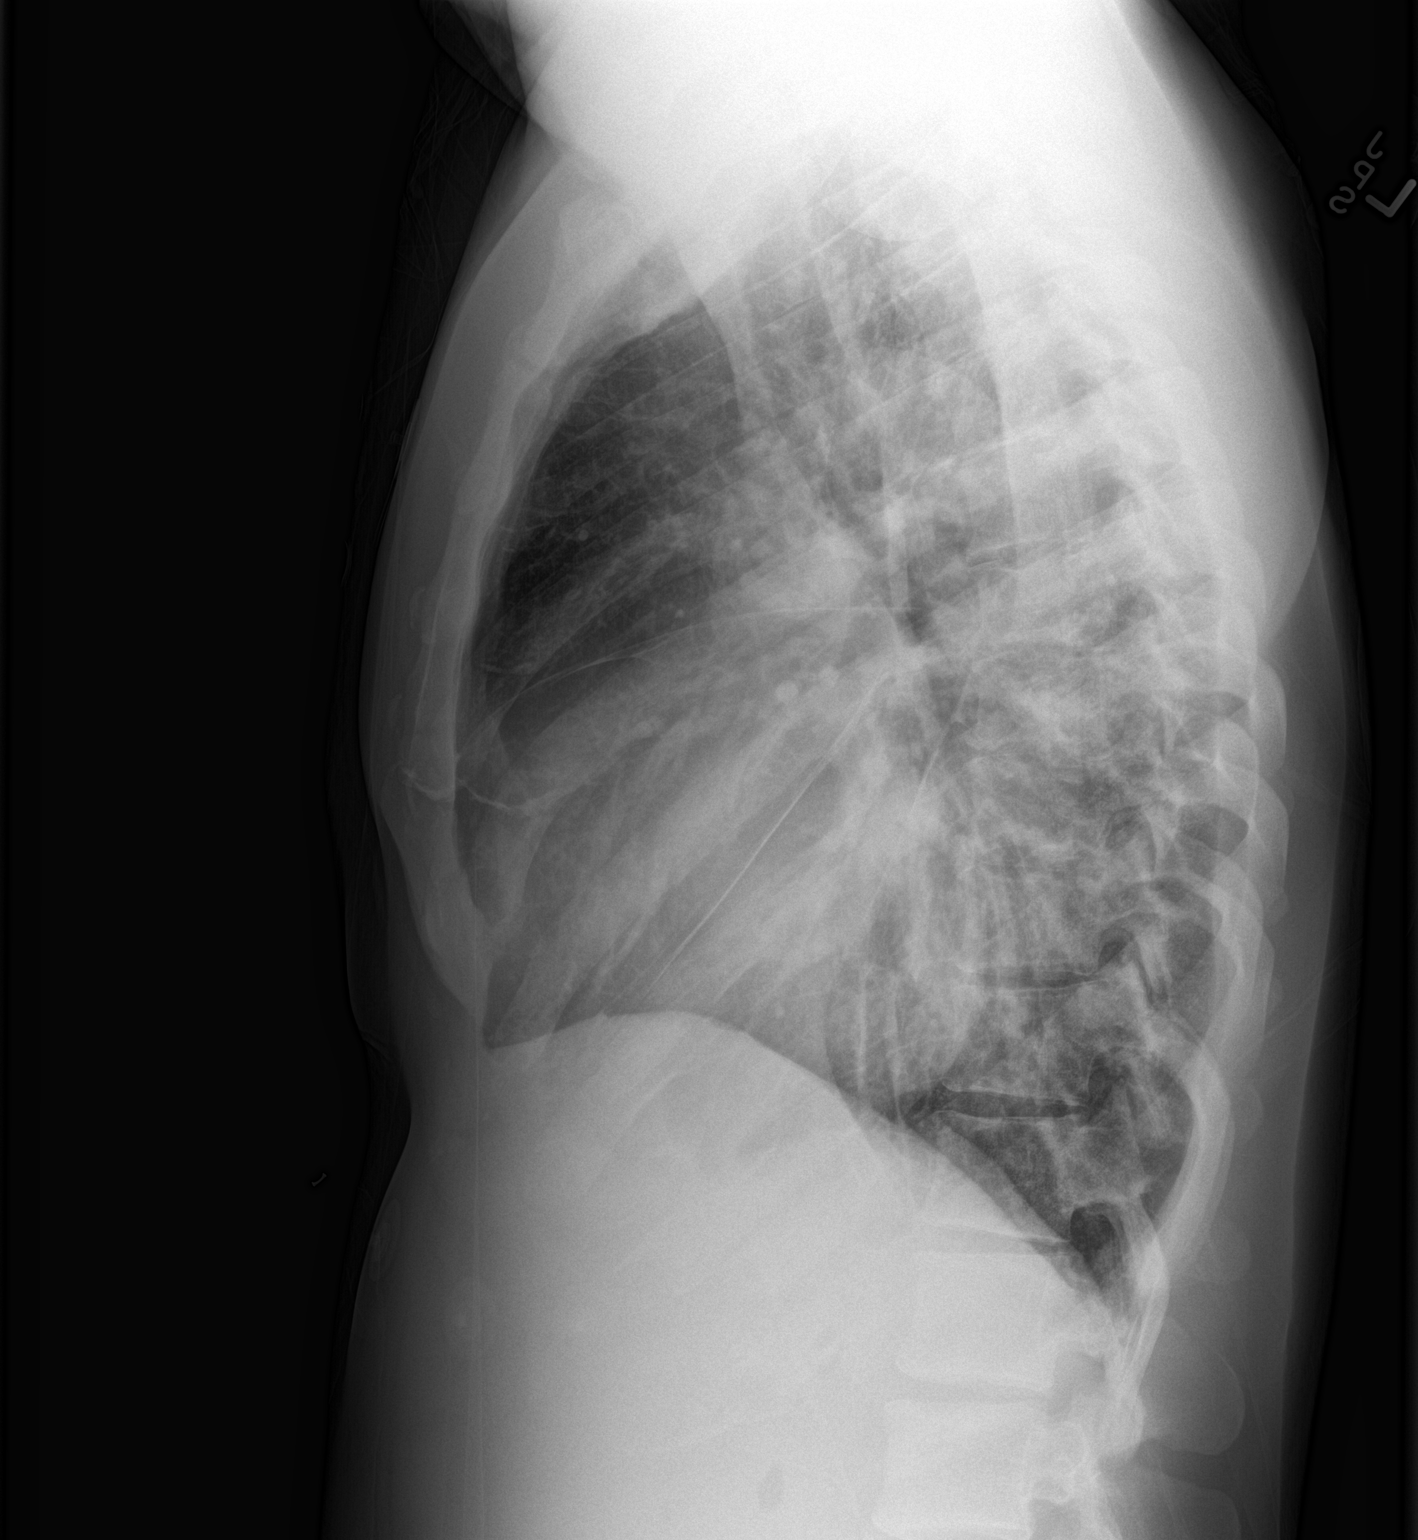

[2 of 2 positions shown; findings below may reference images not displayed]

FINDINGS: Mediastinum and hilar structures are normal. Cardiomegaly with
bilateral pulmonary interstitial prominence including Kerley
B-lines. Findings consistent with congestive heart failure. There is
a right lower lobe alveolar infiltrate. This could represent focal
pulmonary edema or pneumonia. No pleural effusion or pneumothorax.
IMPRESSION: 1. Cardiomegaly with bilateral pulmonary interstitial prominence
including Kerley B-lines. These findings are consistent with
congestive heart failure.
2. Right lower low alveolar infiltrate. This could represent focal
pulmonary edema and/or pneumonia.

## 2016-04-16 IMAGING — DX DG CHEST 2V
2 series · 2 of 2 positions shown · non-contrast
Comparison: April 20, 2014.

CLINICAL DATA: Shortness of breath.

EXAM:
CHEST  2 VIEW

[chest pa]
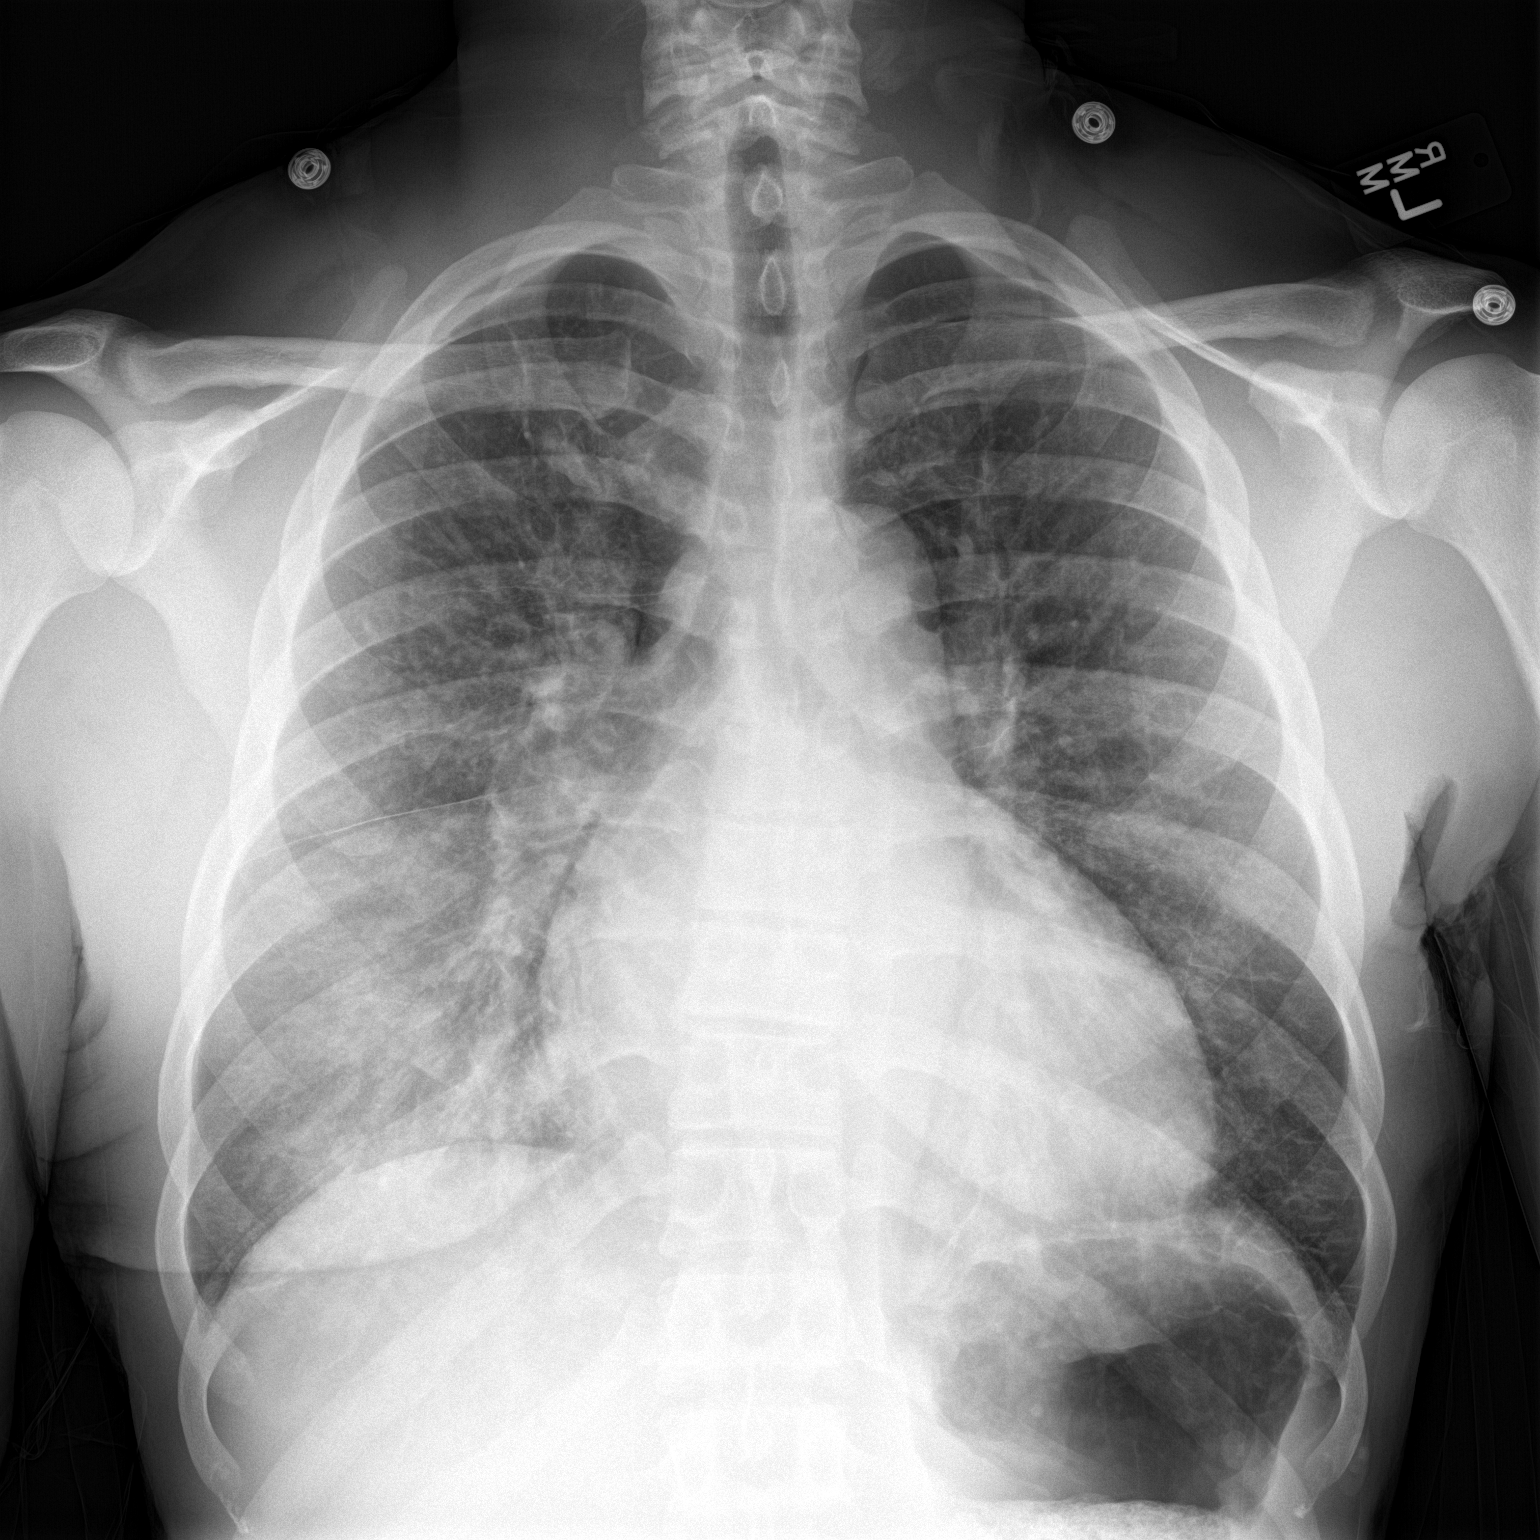

[chest lat]
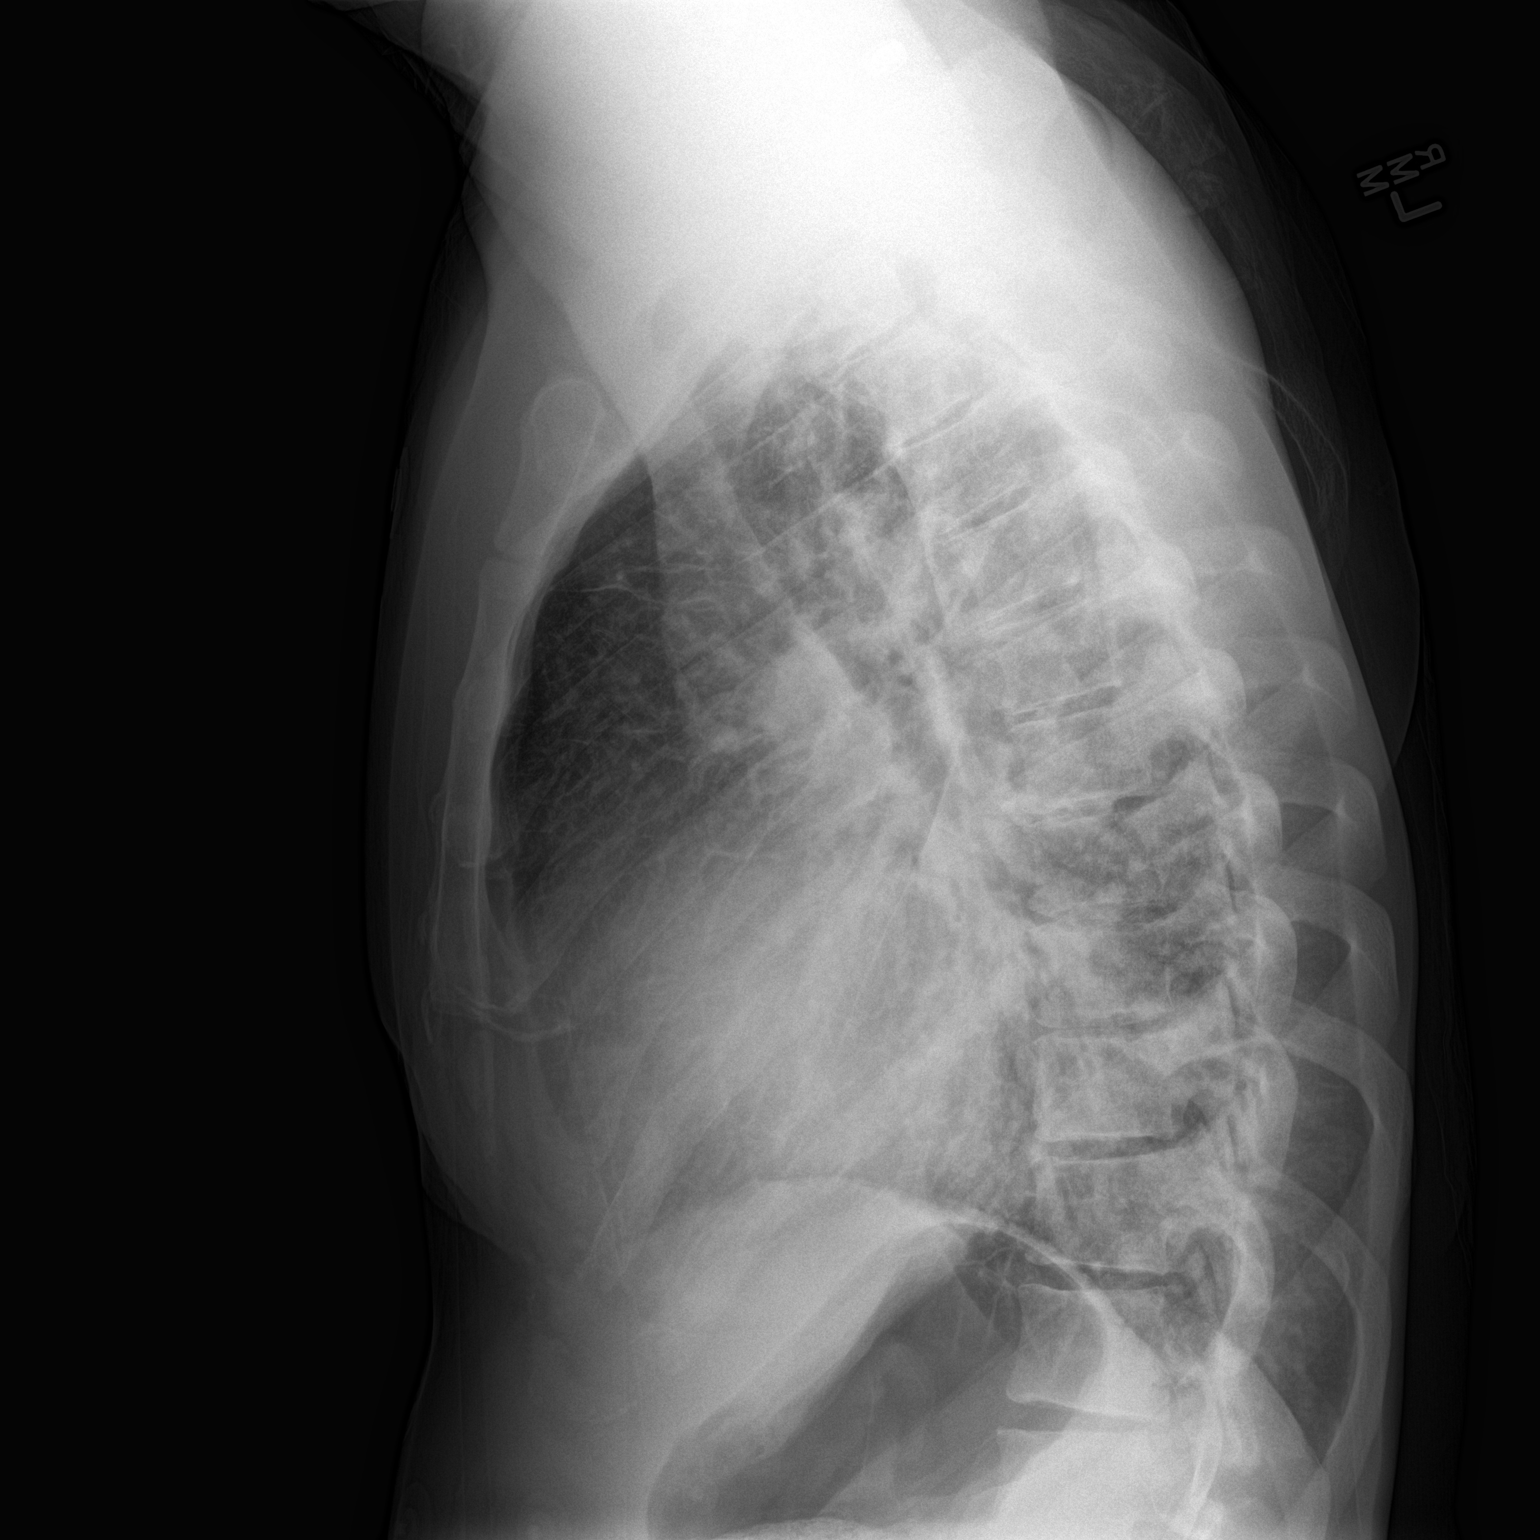

[2 of 2 positions shown; findings below may reference images not displayed]

FINDINGS: Stable cardiomegaly. No pneumothorax or significant pleural effusion
is noted. Increased right lower lobe opacity is noted compared to
prior exam consistent with worsening pneumonia or edema. Stable
Kerley B-lines are noted laterally in left lung base concerning for
pulmonary edema. Bony thorax is intact.
IMPRESSION: Stable mild pulmonary edema seen in left lung base. Increased right
lower lobe opacity is noted concerning for pneumonia or edema.

## 2016-04-17 IMAGING — CR DG CHEST 2V
2 series · 2 of 2 positions shown · non-contrast
Comparison: Chest x-ray 04/24/2014.

CLINICAL DATA: 28-year-old male with shortness of breath and
hypertension.

EXAM:
CHEST  2 VIEW

[chest pa]
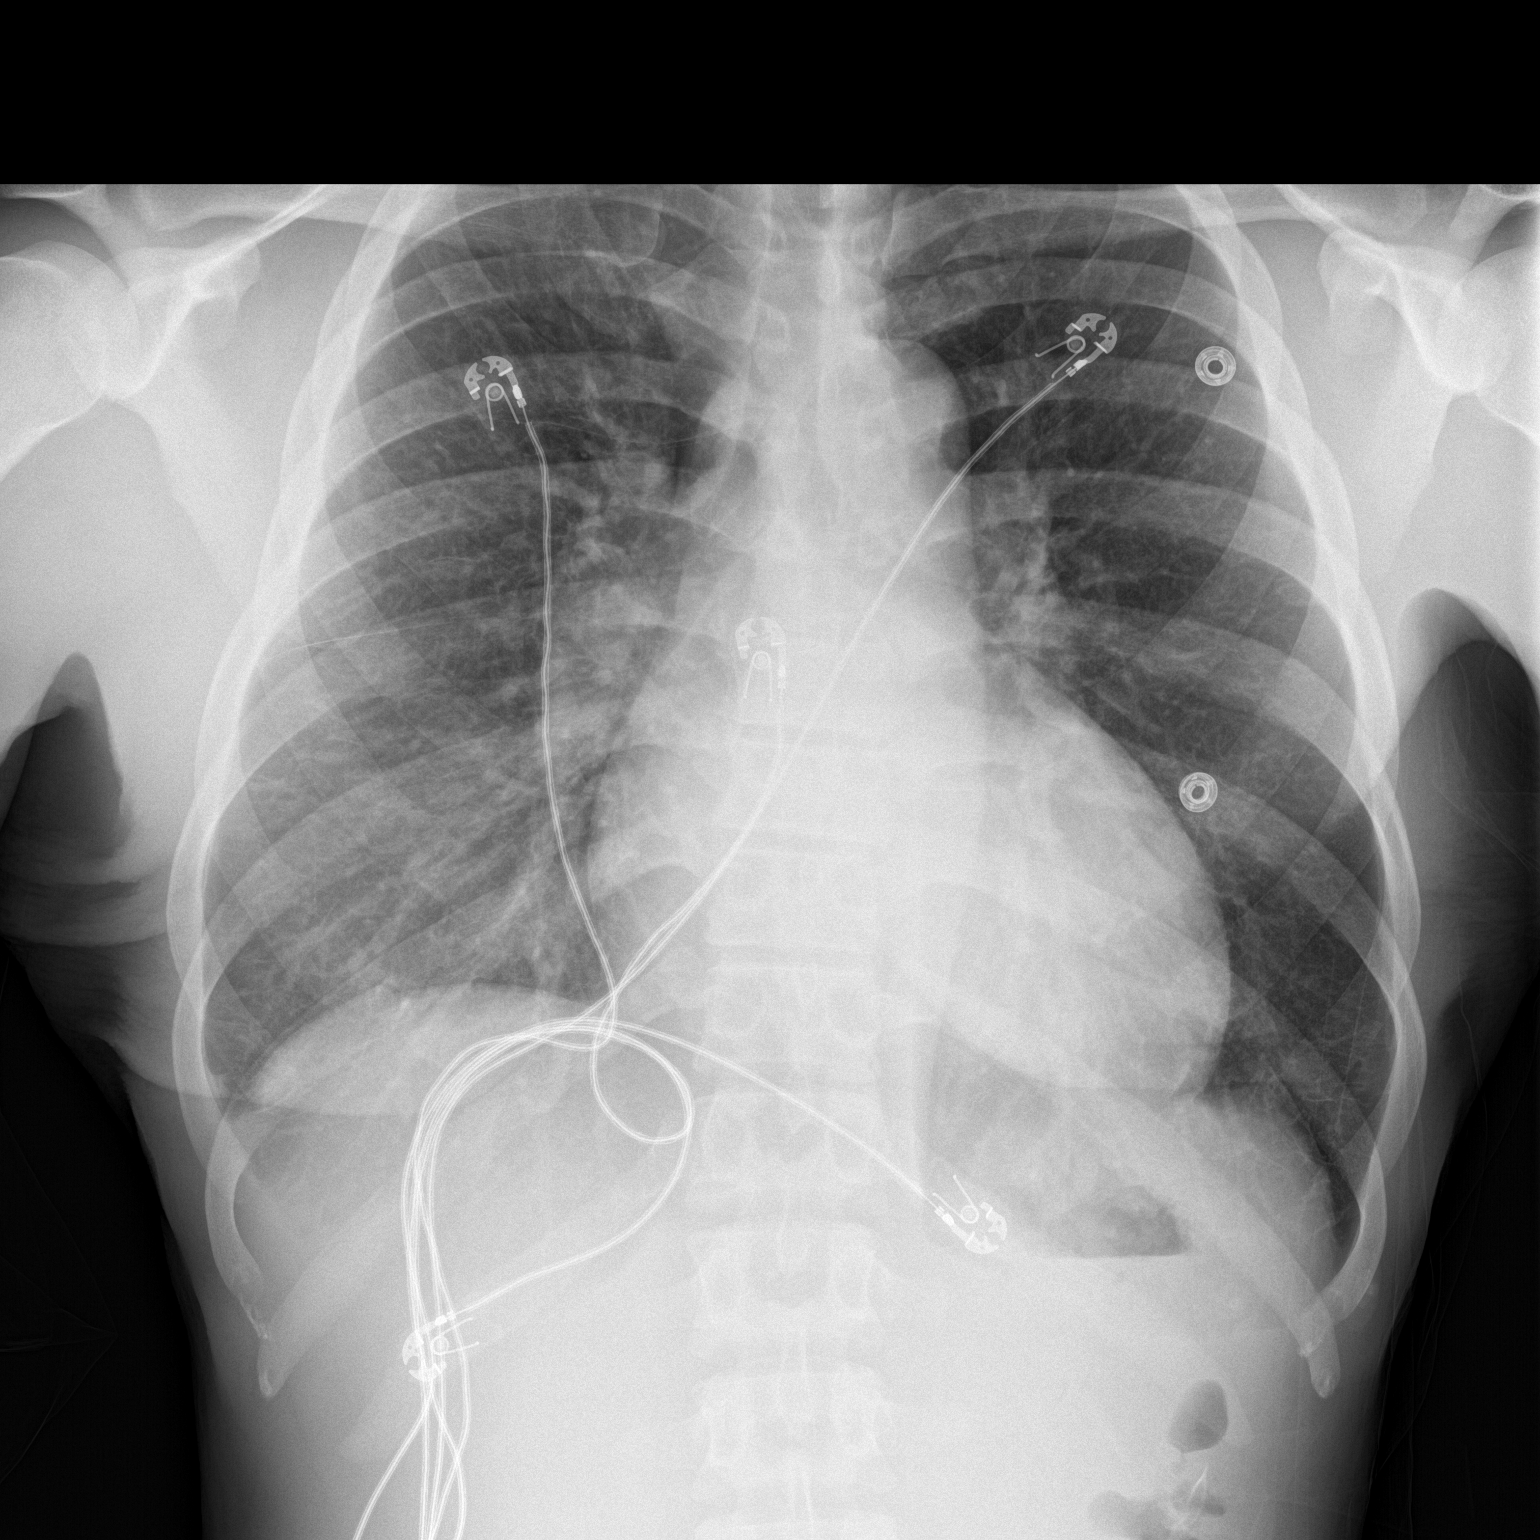

[chest lat]
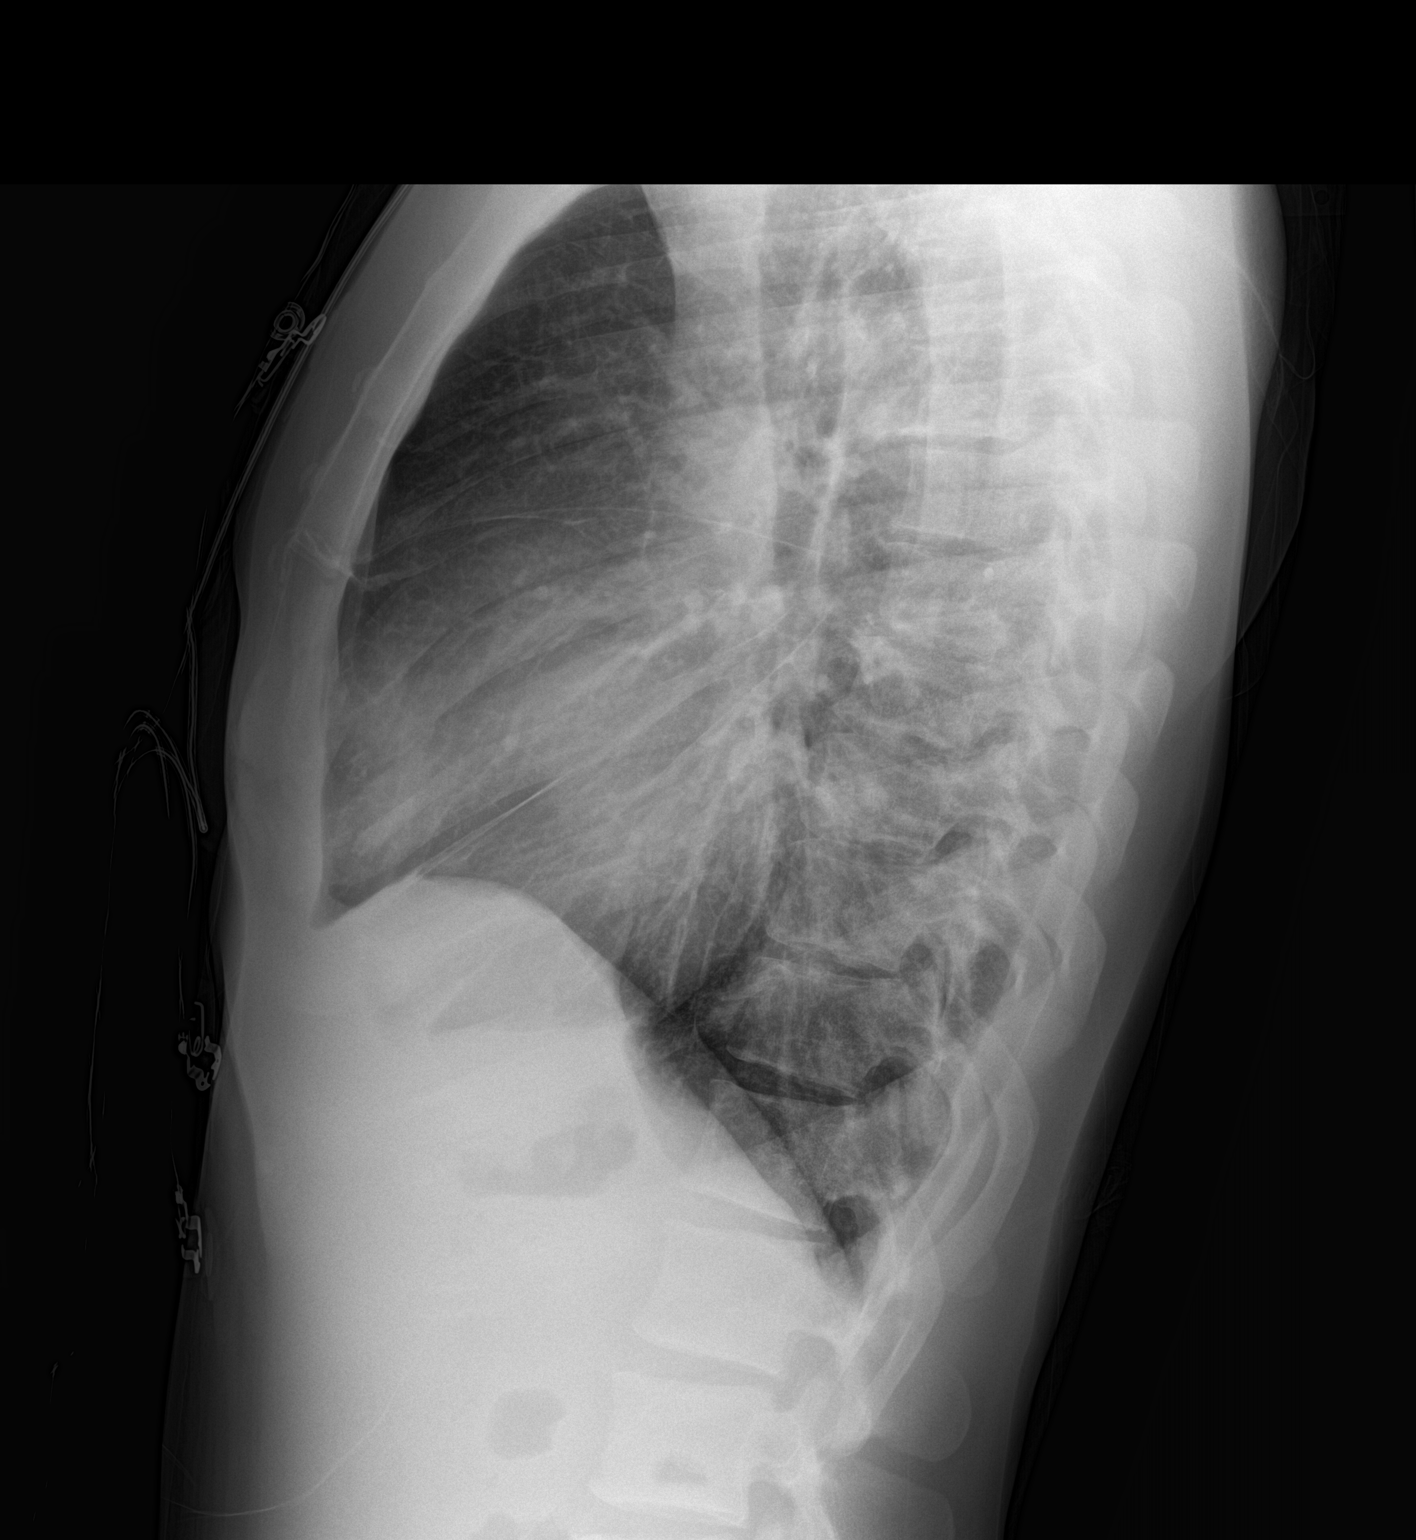

[2 of 2 positions shown; findings below may reference images not displayed]

FINDINGS: Significantly improved aeration compared to yesterday's examination.
No residual signs of pulmonary edema. However, there continues to be
and ill-defined area of interstitial prominence and peribronchial
cuffing throughout the right mid to lower lung involving portions of
the right middle lobe and right lower lobe, likely to reflect
resolving bronchopneumonia. Pulmonary vasculature is now normal.
Heart size remains borderline enlarged. Upper mediastinal contours
are within normal limits.
IMPRESSION: 1. Significantly improved aeration with resolved pulmonary edema but
persistent evidence of resolving bronchopneumonia in the right
middle and lower lobes.

## 2016-05-01 ENCOUNTER — Encounter (HOSPITAL_COMMUNITY): Payer: Self-pay | Admitting: Emergency Medicine

## 2016-05-01 ENCOUNTER — Encounter (HOSPITAL_COMMUNITY): Payer: Self-pay

## 2016-05-01 ENCOUNTER — Emergency Department (HOSPITAL_COMMUNITY): Payer: Medicaid Other

## 2016-05-01 ENCOUNTER — Emergency Department (HOSPITAL_COMMUNITY)
Admission: EM | Admit: 2016-05-01 | Discharge: 2016-05-02 | Disposition: A | Discharge status: Home / Self Care | Payer: Medicaid Other | Attending: Emergency Medicine | Admitting: Emergency Medicine

## 2016-05-01 ENCOUNTER — Non-Acute Institutional Stay (HOSPITAL_COMMUNITY)
Admission: EM | Admit: 2016-05-01 | Discharge: 2016-05-01 | Disposition: A | Discharge status: Home / Self Care | Payer: Medicaid Other | Attending: Emergency Medicine | Admitting: Emergency Medicine

## 2016-05-01 DIAGNOSIS — Z87891 Personal history of nicotine dependence: Secondary | ICD-10-CM | POA: Diagnosis not present

## 2016-05-01 DIAGNOSIS — N186 End stage renal disease: Secondary | ICD-10-CM | POA: Diagnosis not present

## 2016-05-01 DIAGNOSIS — I428 Other cardiomyopathies: Secondary | ICD-10-CM | POA: Diagnosis not present

## 2016-05-01 DIAGNOSIS — I10 Essential (primary) hypertension: Secondary | ICD-10-CM | POA: Diagnosis present

## 2016-05-01 DIAGNOSIS — Z79899 Other long term (current) drug therapy: Secondary | ICD-10-CM | POA: Diagnosis not present

## 2016-05-01 DIAGNOSIS — Z992 Dependence on renal dialysis: Secondary | ICD-10-CM | POA: Diagnosis not present

## 2016-05-01 DIAGNOSIS — Z5321 Procedure and treatment not carried out due to patient leaving prior to being seen by health care provider: Secondary | ICD-10-CM | POA: Diagnosis not present

## 2016-05-01 DIAGNOSIS — I132 Hypertensive heart and chronic kidney disease with heart failure and with stage 5 chronic kidney disease, or end stage renal disease: Secondary | ICD-10-CM | POA: Insufficient documentation

## 2016-05-01 DIAGNOSIS — I5022 Chronic systolic (congestive) heart failure: Secondary | ICD-10-CM | POA: Insufficient documentation

## 2016-05-01 LAB — CBC WITH DIFFERENTIAL/PLATELET
Basophils Absolute: 0 10*3/uL (ref 0.0–0.1)
Basophils Relative: 0 %
Eosinophils Absolute: 0.4 10*3/uL (ref 0.0–0.7)
Eosinophils Relative: 4 %
HCT: 31.1 % — ABNORMAL LOW (ref 39.0–52.0)
Hemoglobin: 10.1 g/dL — ABNORMAL LOW (ref 13.0–17.0)
Lymphocytes Relative: 22 %
Lymphs Abs: 1.9 10*3/uL (ref 0.7–4.0)
MCH: 30.1 pg (ref 26.0–34.0)
MCHC: 32.5 g/dL (ref 30.0–36.0)
MCV: 92.6 fL (ref 78.0–100.0)
Monocytes Absolute: 0.8 10*3/uL (ref 0.1–1.0)
Monocytes Relative: 9 %
NEUTROS ABS: 5.6 10*3/uL (ref 1.7–7.7)
Neutrophils Relative %: 65 %
Platelets: 199 10*3/uL (ref 150–400)
RBC: 3.36 MIL/uL — ABNORMAL LOW (ref 4.22–5.81)
RDW: 14.8 % (ref 11.5–15.5)
WBC: 8.6 10*3/uL (ref 4.0–10.5)

## 2016-05-01 LAB — COMPREHENSIVE METABOLIC PANEL
ALT: 17 U/L (ref 17–63)
ANION GAP: 14 (ref 5–15)
AST: 21 U/L (ref 15–41)
Albumin: 4.3 g/dL (ref 3.5–5.0)
Alkaline Phosphatase: 70 U/L (ref 38–126)
BUN: 53 mg/dL — ABNORMAL HIGH (ref 6–20)
CALCIUM: 9.6 mg/dL (ref 8.9–10.3)
CO2: 23 mmol/L (ref 22–32)
Chloride: 100 mmol/L — ABNORMAL LOW (ref 101–111)
Creatinine, Ser: 13.74 mg/dL — ABNORMAL HIGH (ref 0.61–1.24)
GFR calc Af Amer: 5 mL/min — ABNORMAL LOW (ref >60)
GFR calc non Af Amer: 4 mL/min — ABNORMAL LOW (ref >60)
Glucose, Bld: 84 mg/dL (ref 65–99)
Potassium: 6.7 mmol/L (ref 3.5–5.1)
SODIUM: 137 mmol/L (ref 135–145)
Total Bilirubin: 0.8 mg/dL (ref 0.3–1.2)
Total Protein: 7.5 g/dL (ref 6.5–8.1)

## 2016-05-01 LAB — URINALYSIS, ROUTINE W REFLEX MICROSCOPIC
BILIRUBIN URINE: NEGATIVE
Glucose, UA: 50 mg/dL — AB
Ketones, ur: NEGATIVE mg/dL
NITRITE: NEGATIVE
PH: 9 — AB (ref 5.0–8.0)
Protein, ur: 100 mg/dL — AB
SPECIFIC GRAVITY, URINE: 1.006 (ref 1.005–1.030)

## 2016-05-01 LAB — I-STAT CG4 LACTIC ACID, ED: Lactic Acid, Venous: 1.38 mmol/L (ref 0.5–1.9)

## 2016-05-01 MED ORDER — HYDRALAZINE HCL 20 MG/ML IJ SOLN
10.0000 mg | Freq: Once | INTRAMUSCULAR | Status: AC
  Administered 2016-05-01: 10 mg via INTRAVENOUS
  Filled 2016-05-01: qty 1

## 2016-05-01 MED ORDER — HYDRALAZINE HCL 20 MG/ML IJ SOLN
INTRAMUSCULAR | Status: AC
  Filled 2016-05-01: qty 1

## 2016-05-01 MED ORDER — ACETAMINOPHEN 325 MG PO TABS
650.0000 mg | ORAL_TABLET | Freq: Once | ORAL | Status: AC | PRN
  Administered 2016-05-01: 650 mg via ORAL
  Filled 2016-05-01: qty 2

## 2016-05-01 MED ORDER — ALBUTEROL SULFATE (2.5 MG/3ML) 0.083% IN NEBU
2.5000 mg | INHALATION_SOLUTION | RESPIRATORY_TRACT | Status: DC | PRN
  Administered 2016-05-01: 2.5 mg via RESPIRATORY_TRACT
  Filled 2016-05-01: qty 3

## 2016-05-01 MED ORDER — HYDRALAZINE HCL 20 MG/ML IJ SOLN
INTRAMUSCULAR | Status: AC
  Administered 2016-05-01: 20 mg
  Filled 2016-05-01: qty 1

## 2016-05-01 MED ORDER — ONDANSETRON HCL 4 MG/2ML IJ SOLN
4.0000 mg | Freq: Once | INTRAMUSCULAR | Status: AC
  Administered 2016-05-01: 4 mg via INTRAVENOUS
  Filled 2016-05-01: qty 2

## 2016-05-01 MED ORDER — HYDRALAZINE HCL 20 MG/ML IJ SOLN
10.0000 mg | INTRAMUSCULAR | Status: DC | PRN
  Administered 2016-05-01: 20 mg via INTRAVENOUS

## 2016-05-01 NOTE — ED Triage Notes (Signed)
Pt states he was at dialysis tx and they refused tx today due to cough and elevated BP; pt states he took cough med at home which elevated BP; pt states cough x 2 days ago and feels he is getting sick. Pt a&ox4 on arrival. Pt denies pain on arrival.

## 2016-05-01 NOTE — Progress Notes (Signed)
Patient to be discharged after hemodialysis BP high throughout not resolved by treatment. Patient temp increased from 98.6 to 99.6 pain level of 10 for headache. Notified ED RN Mcarthur Rossetti as well as MD Arlean Hopping. Removed iv line from left forearm. Patient  to be transported to ER triage area for Discharge.

## 2016-05-01 NOTE — ED Notes (Signed)
Pts name called to recheck vitals and to go to a room NO ANSWER

## 2016-05-01 NOTE — ED Provider Notes (Signed)
[facility]-EMERGENCY DEPT Provider Note   CSN: [id number] Arrival date & time: 05/01/16  4098     History   Chief Complaint Chief Complaint  Patient presents with  . Vascular Access Problem    Pt needs dialysis  . Cough    HPI Austin Hardy is a 31 y.o. male.CC:  Needs dialysis   HPI: Patient is a patient at [facility] in [facility]. Dr. Janit Pagan, and Dr. Lequita Halt. Dialysis, is at [facility].  He is a Tuesday, Thursday, Saturday dialysis patient. He has not missed any dialysis. He states he is compliant with his blood pressure medicines but he vomited them up this morning. Upon arrival to his dialysis center he was told his blood pressure was too high. He was coughing. He felt short of breath. He was referred here for treatment. He has not had fever. He states his fingertips are tingling which is typical "when I need dialysis".    Past Medical History:  Diagnosis Date  . Cardiomegaly   . Chronic kidney disease   . Hypertension     Patient Active Problem List   Diagnosis Date Noted  . Chronic systolic congestive heart failure, NYHA class 2 (HCC) 06/04/2014  . Non-ischemic cardiomyopathy (HCC) 04/26/2014  . Malnutrition of moderate degree (HCC) 04/26/2014  . Volume overload 04/24/2014  . Acute pulmonary edema (HCC)   . ESRD (end stage renal disease) (HCC)   . Essential hypertension   . Shortness of breath   . Mechanical complication of other vascular device, implant, and graft 06/27/2012    Past Surgical History:  Procedure Laterality Date  . AV FISTULA PLACEMENT Right March 2013  . FRACTURE SURGERY Left 2005   Ankle       Home Medications    Prior to Admission medications   Medication Sig Start Date End Date Taking? Authorizing Provider  carvedilol (COREG) 25 MG tablet Take 1 tablet (25 mg total) by mouth 2 (two) times daily with a meal. 04/27/14   Stark Bray, MD  hydrALAZINE (APRESOLINE) 25 MG tablet Take 1 tablet (25 mg total) by mouth 3  (three) times daily. 04/27/14   Stark Bray, MD  ibuprofen (ADVIL,MOTRIN) 800 MG tablet Take 1 tablet (800 mg total) by mouth 3 (three) times daily. 05/28/15   Rolland Porter, MD  isosorbide mononitrate (IMDUR) 30 MG 24 hr tablet Take 2 tablets (60 mg total) by mouth daily. 04/27/14   Stark Bray, MD  ketotifen (ZADITOR) 0.025 % ophthalmic solution Place 5 drops into both eyes daily as needed (cut in the eye).    Historical Provider, MD  sevelamer (RENAGEL) 800 MG tablet Take 2,400 mg by mouth 4 (four) times daily -  with meals and at bedtime.    Historical Provider, MD  sevelamer carbonate (RENVELA) 800 MG tablet Take 2,400 mg by mouth 3 (three) times daily with meals. Three tab.per meal    Historical Provider, MD  traMADol (ULTRAM) 50 MG tablet Take 1 tablet (50 mg total) by mouth every 6 (six) hours as needed. 05/28/15   Rolland Porter, MD    Family History Family History  Problem Relation Age of Onset  . Diabetes Mother   . Hypertension Mother   . Diabetes Father   . Hypertension Father     Social History Social History  Substance Use Topics  . Smoking status: Former Smoker    Packs/day: 0.50    Quit date: 03/27/2003  . Smokeless tobacco: Never Used  . Alcohol  use No     Allergies   Patient has no known allergies.   Review of Systems Review of Systems  Constitutional: Negative for appetite change, chills, diaphoresis, fatigue and fever.  HENT: Negative for mouth sores, sore throat and trouble swallowing.   Eyes: Negative for visual disturbance.  Respiratory: Positive for cough and shortness of breath. Negative for chest tightness and wheezing.   Cardiovascular: Negative for chest pain.  Gastrointestinal: Negative for abdominal distention, abdominal pain, diarrhea, nausea and vomiting.  Endocrine: Negative for polydipsia, polyphagia and polyuria.  Genitourinary: Negative for dysuria, frequency and hematuria.  Musculoskeletal: Negative for gait problem.  Skin: Negative for color  change, pallor and rash.  Neurological: Positive for numbness. Negative for dizziness, syncope, light-headedness and headaches.  Hematological: Does not bruise/bleed easily.  Psychiatric/Behavioral: Negative for behavioral problems and confusion.     Physical Exam Updated Vital Signs BP (!) 184/107 (BP Location: Left Arm)   Pulse 113   Temp 99.6 F (37.6 C) (Oral)   Resp 15   Wt 186 lb 8.2 oz (84.6 kg) Comment: stood to scale   SpO2 94%   BMI 26.01 kg/m   Physical Exam  Constitutional: He is oriented to person, place, and time. He appears well-developed and well-nourished. No distress.  BP 173/104  HENT:  Head: Normocephalic.  Eyes: Conjunctivae are normal. Pupils are equal, round, and reactive to light. No scleral icterus.  Neck: Normal range of motion. Neck supple. No thyromegaly present.  Cardiovascular: Normal rate and regular rhythm.  Exam reveals no gallop and no friction rub.   No murmur heard. Pulmonary/Chest: No respiratory distress. He has no wheezes. He has no rales.  Diffuse crackles and wheezing in all fields. Dyspnea to 3-4 words.  Abdominal: Soft. Bowel sounds are normal. He exhibits no distension. There is no tenderness. There is no rebound.  Musculoskeletal: Normal range of motion.  Neurological: He is alert and oriented to person, place, and time.  Skin: Skin is warm and dry. No rash noted.  Psychiatric: He has a normal mood and affect. His behavior is normal.     ED Treatments / Results  Labs (all labs ordered are listed, but only abnormal results are displayed) Labs Reviewed  COMPREHENSIVE METABOLIC PANEL - Abnormal; Notable for the following:       Result Value   Potassium 6.7 (*)    Chloride 100 (*)    BUN 53 (*)    Creatinine, Ser 13.74 (*)    GFR calc non Af Amer 4 (*)    GFR calc Af Amer 5 (*)    All other components within normal limits  CBC WITH DIFFERENTIAL/PLATELET - Abnormal; Notable for the following:    RBC 3.36 (*)    Hemoglobin  10.1 (*)    HCT 31.1 (*)    All other components within normal limits  URINALYSIS, ROUTINE W REFLEX MICROSCOPIC - Abnormal; Notable for the following:    Color, Urine STRAW (*)    APPearance HAZY (*)    pH 9.0 (*)    Glucose, UA 50 (*)    Hgb urine dipstick SMALL (*)    Protein, ur 100 (*)    Leukocytes, UA TRACE (*)    Bacteria, UA RARE (*)    Squamous Epithelial / LPF 0-5 (*)    All other components within normal limits  I-STAT CG4 LACTIC ACID, ED    EKG  EKG Interpretation  Date/Time:  Tuesday May 01 2016 09:52:11 EST Ventricular Rate:  94  PR Interval:    QRS Duration: 98 QT Interval:  369 QTC Calculation: 462 R Axis:   42 Text Interpretation:  Sinus rhythm Low voltage, precordial leads Baseline wander in lead(s) V1 V2 No significant change since last tracing Confirmed by ALLEN  MD, ANTHONY ([id number]) on 05/02/2016 5:50:02 PM       Radiology No results found.  Procedures Procedures (including critical care time)  Medications Ordered in ED Medications  ondansetron (ZOFRAN) injection 4 mg (4 mg Intravenous Given 05/01/16 0927)  hydrALAZINE (APRESOLINE) injection 10 mg (10 mg Intravenous Given 05/01/16 0927)  hydrALAZINE (APRESOLINE) 20 MG/ML injection (20 mg  Given by Other 05/01/16 1411)     Initial Impression / Assessment and Plan / ED Course  I have reviewed the triage vital signs and the nursing notes.  Pertinent labs & imaging results that were available during my care of the patient were reviewed by me and considered in my medical decision making (see chart for details).    His x-ray does not show frank pulmonary edema and vascular congestion. Potassium pending. We will treat his blood pressure. Will give antiemetics. We'll discuss with nephrology.  Final Clinical Impressions(s) / ED Diagnoses   Final diagnoses:  ESRD (end stage renal disease) [facility])    New Prescriptions Discharge Medication List as of 05/01/2016  3:14 PM       Rolland Porter, MD 05/04/16  2308

## 2016-05-01 NOTE — Procedures (Signed)
Asked to see patient for HD.  Patient says his usual HD is TTS at [facility] on [address]  6-7 lbs over dry wt today, weighed standing up.  No SOB, CXR clear. In ED because " my dialysis unit refused to dialyze me because my BP was up".  Blames the BP issue on taking Nyquil.  Denies any cough, fever, chills, myalgias.  Exam R arm AVF, scattered mild exp wheezing on lung exam, cardiac exam normal, no LE edema.    Plan is for 4h HD session, UF to dry wt , for dc home after HD.  Prn IV BP meds during HD today.     I was present at this dialysis session, have reviewed the session itself and made  appropriate changes Vinson Moselle MD [facility] pager [phone]   05/01/2016, 10:29 AM

## 2016-05-01 NOTE — ED Notes (Signed)
Nurse will draw labs. 

## 2016-05-01 NOTE — ED Triage Notes (Signed)
Pt reports being seen in ED earlier today, pt went upstairs to be dialyzed and was sent back to ED due to BP being too elevated. Pt also reports cough/cold symptoms x2 days.

## 2016-05-01 NOTE — Progress Notes (Signed)
Pt will persistent high BP's on HD in spite of UF to dry wt.  Has headache, no severe, no other neuro symptoms.  Declined tylenol.  Instructed pt to return to ED if having any worsening of headache or other symptoms such as CP or SOB.  Plan - dc home, resume usual BP meds at usual HD unit. Do not use Nyquil or any OTC meds with BP-raising products included.   Vinson Moselle MD [employer] pgr [phone]   05/01/2016, 2:05 PM

## 2016-05-01 NOTE — Progress Notes (Signed)
Initial note placed at incorrect time 0254.... Patient to be discharged after hemodialysis BP high throughout not resolved by treatment. Patient temp increased from 98.6 to 99.6 pain level of 10 for headache. Notified ED RN Mcarthur Rossetti as well as MD Arlean Hopping. Removed iv line from left forearm. Patient  to be transported to ER triage area for Discharge.

## 2016-05-01 NOTE — ED Notes (Signed)
Pt to NF stating he feels as though his hands are starting to tingle due to he needs dialysis. Pt updated and will be moved ASAP.

## 2022-02-16 ENCOUNTER — Other Ambulatory Visit: Payer: Self-pay

## 2022-02-16 ENCOUNTER — Emergency Department (HOSPITAL_COMMUNITY): Payer: Medicaid Other

## 2022-02-16 ENCOUNTER — Encounter (HOSPITAL_COMMUNITY): Payer: Self-pay

## 2022-02-16 ENCOUNTER — Emergency Department (HOSPITAL_COMMUNITY)
Admission: EM | Admit: 2022-02-16 | Discharge: 2022-02-16 | Disposition: A | Discharge status: Home / Self Care | Payer: Medicaid Other | Attending: Emergency Medicine | Admitting: Emergency Medicine

## 2022-02-16 DIAGNOSIS — R059 Cough, unspecified: Secondary | ICD-10-CM | POA: Insufficient documentation

## 2022-02-16 DIAGNOSIS — R000 Tachycardia, unspecified: Secondary | ICD-10-CM | POA: Diagnosis not present

## 2022-02-16 DIAGNOSIS — R002 Palpitations: Secondary | ICD-10-CM | POA: Diagnosis present

## 2022-02-16 DIAGNOSIS — Z1152 Encounter for screening for COVID-19: Secondary | ICD-10-CM | POA: Diagnosis not present

## 2022-02-16 DIAGNOSIS — R0602 Shortness of breath: Secondary | ICD-10-CM | POA: Diagnosis not present

## 2022-02-16 LAB — COMPREHENSIVE METABOLIC PANEL
ALT: 18 U/L (ref 0–44)
AST: 23 U/L (ref 15–41)
Albumin: 4.8 g/dL (ref 3.5–5.0)
Alkaline Phosphatase: 74 U/L (ref 38–126)
Anion gap: 14 (ref 5–15)
BUN: 19 mg/dL (ref 6–20)
CO2: 18 mmol/L — ABNORMAL LOW (ref 22–32)
Calcium: 10.7 mg/dL — ABNORMAL HIGH (ref 8.9–10.3)
Chloride: 107 mmol/L (ref 98–111)
Creatinine, Ser: 1.9 mg/dL — ABNORMAL HIGH (ref 0.61–1.24)
GFR, Estimated: 46 mL/min — ABNORMAL LOW (ref >60)
Glucose, Bld: 111 mg/dL — ABNORMAL HIGH (ref 70–99)
Potassium: 3.5 mmol/L (ref 3.5–5.1)
Sodium: 139 mmol/L (ref 135–145)
Total Bilirubin: 1.6 mg/dL — ABNORMAL HIGH (ref 0.3–1.2)
Total Protein: 8.3 g/dL — ABNORMAL HIGH (ref 6.5–8.1)

## 2022-02-16 LAB — URINALYSIS, ROUTINE W REFLEX MICROSCOPIC
Bacteria, UA: NONE SEEN
Bilirubin Urine: NEGATIVE
Glucose, UA: NEGATIVE mg/dL
Hgb urine dipstick: NEGATIVE
Ketones, ur: NEGATIVE mg/dL
Leukocytes,Ua: NEGATIVE
Nitrite: NEGATIVE
Protein, ur: 30 mg/dL — AB
Specific Gravity, Urine: 1.024 (ref 1.005–1.030)
pH: 5 (ref 5.0–8.0)

## 2022-02-16 LAB — RESP PANEL BY RT-PCR (FLU A&B, COVID) ARPGX2
Influenza A by PCR: NEGATIVE
Influenza B by PCR: NEGATIVE
SARS Coronavirus 2 by RT PCR: NEGATIVE

## 2022-02-16 LAB — CBC WITH DIFFERENTIAL/PLATELET
Abs Immature Granulocytes: 0.01 10*3/uL (ref 0.00–0.07)
Basophils Absolute: 0 10*3/uL (ref 0.0–0.1)
Basophils Relative: 1 %
Eosinophils Absolute: 0 10*3/uL (ref 0.0–0.5)
Eosinophils Relative: 1 %
HCT: 46.5 % (ref 39.0–52.0)
Hemoglobin: 16.3 g/dL (ref 13.0–17.0)
Immature Granulocytes: 0 %
Lymphocytes Relative: 27 %
Lymphs Abs: 1.3 10*3/uL (ref 0.7–4.0)
MCH: 32.3 pg (ref 26.0–34.0)
MCHC: 35.1 g/dL (ref 30.0–36.0)
MCV: 92.3 fL (ref 80.0–100.0)
Monocytes Absolute: 0.3 10*3/uL (ref 0.1–1.0)
Monocytes Relative: 7 %
Neutro Abs: 3.1 10*3/uL (ref 1.7–7.7)
Neutrophils Relative %: 64 %
Platelets: 328 10*3/uL (ref 150–400)
RBC: 5.04 MIL/uL (ref 4.22–5.81)
RDW: 14.4 % (ref 11.5–15.5)
WBC: 4.7 10*3/uL (ref 4.0–10.5)
nRBC: 0 % (ref 0.0–0.2)

## 2022-02-16 LAB — TROPONIN I (HIGH SENSITIVITY)
Troponin I (High Sensitivity): 10 ng/L (ref <18)
Troponin I (High Sensitivity): 9 ng/L (ref <18)

## 2022-02-16 LAB — MAGNESIUM: Magnesium: 1.6 mg/dL — ABNORMAL LOW (ref 1.7–2.4)

## 2022-02-16 NOTE — ED Provider Notes (Signed)
[facility] EMERGENCY DEPARTMENT Provider Note   CSN: [id number] Arrival date & time: 02/16/22  1523    History  Chief Complaint  Patient presents with   Tachycardia    Austin Hardy is a 36 y.o. male history of renal transplant followed by [facility] here for evaluation of palpitations and shortness of breath.  Patient states yesterday he developed sensation of heart racing and shortness of breath.  Has noted he has had a nonproductive cough.  Initially did not have any chest pain.  No back pain.  No lower extremity swelling.  No recent surgery, immobilization or malignancy.  No pain or swelling to extremities.  He is compliant with his home transplant meds.  Changes in his urination.  No back pain, abdominal pain, flank pain.  No fever, congestion or rhinorrhea.  No sick contacts. Fall yesterday getting off commode. Hit left side of head. No vision changes, jaw pain. No hemoptysis.  HPI     Home Medications Prior to Admission medications   Medication Sig Start Date End Date Taking? Authorizing Provider  carvedilol (COREG) 25 MG tablet Take 1 tablet (25 mg total) by mouth 2 (two) times daily with a meal. 04/27/14   Mallory, Thedora Hinders, MD  hydrALAZINE (APRESOLINE) 25 MG tablet Take 1 tablet (25 mg total) by mouth 3 (three) times daily. 04/27/14   Mallory, Thedora Hinders, MD  ibuprofen (ADVIL,MOTRIN) 800 MG tablet Take 1 tablet (800 mg total) by mouth 3 (three) times daily. 05/28/15   Rolland Porter, MD  isosorbide mononitrate (IMDUR) 30 MG 24 hr tablet Take 2 tablets (60 mg total) by mouth daily. 04/27/14   Stark Bray, MD  ketotifen (ZADITOR) 0.025 % ophthalmic solution Place 5 drops into both eyes daily as needed (cut in the eye).    [provider]  sevelamer (RENAGEL) 800 MG tablet Take 2,400 mg by mouth 4 (four) times daily -  with meals and at bedtime.    [provider]  sevelamer carbonate (RENVELA) 800 MG tablet Take 2,400 mg by mouth 3 (three) times daily  with meals. Three tab.per meal    [provider]  traMADol (ULTRAM) 50 MG tablet Take 1 tablet (50 mg total) by mouth every 6 (six) hours as needed. 05/28/15   Rolland Porter, MD      Allergies    Patient has no known allergies.    Review of Systems   Review of Systems  Constitutional: Negative.   HENT: Negative.    Respiratory:  Positive for cough. Negative for apnea, choking, chest tightness, shortness of breath, wheezing and stridor.   Cardiovascular:  Positive for chest pain and palpitations. Negative for leg swelling.  Gastrointestinal: Negative.   Genitourinary: Negative.   Musculoskeletal: Negative.   Skin: Negative.   Neurological: Negative.   All other systems reviewed and are negative.   Physical Exam Updated Vital Signs BP (!) 117/92   Pulse 75   Temp 98.4 F (36.9 C)   Resp 13   SpO2 96%  Physical Exam Vitals and nursing note reviewed.  Constitutional:      General: He is not in acute distress.    Appearance: He is well-developed. He is not ill-appearing, toxic-appearing or diaphoretic.  HENT:     Head: Normocephalic and atraumatic.     Nose: Nose normal.     Mouth/Throat:     Mouth: Mucous membranes are moist.  Eyes:     Pupils: Pupils are equal, round, and reactive  to light.  Cardiovascular:     Rate and Rhythm: Normal rate and regular rhythm.     Pulses: Normal pulses.     Heart sounds: Normal heart sounds.  Pulmonary:     Effort: Pulmonary effort is normal. No respiratory distress.     Breath sounds: Normal breath sounds.  Abdominal:     General: Bowel sounds are normal. There is no distension.     Palpations: Abdomen is soft.     Tenderness: There is no abdominal tenderness. There is no right CVA tenderness, left CVA tenderness, guarding or rebound.  Musculoskeletal:        General: No swelling, tenderness, deformity or signs of injury. Normal range of motion.     Cervical back: Normal range of motion and neck supple.     Right lower leg:  No edema.     Left lower leg: No edema.  Skin:    General: Skin is warm and dry.     Capillary Refill: Capillary refill takes less than 2 seconds.     Comments: Old grafts to RUE  Neurological:     General: No focal deficit present.     Mental Status: He is alert and oriented to person, place, and time.     ED Results / Procedures / Treatments   Labs (all labs ordered are listed, but only abnormal results are displayed) Labs Reviewed  COMPREHENSIVE METABOLIC PANEL - Abnormal; Notable for the following components:      Result Value   CO2 18 (*)    Glucose, Bld 111 (*)    Creatinine, Ser 1.90 (*)    Calcium 10.7 (*)    Total Protein 8.3 (*)    Total Bilirubin 1.6 (*)    GFR, Estimated 46 (*)    All other components within normal limits  URINALYSIS, ROUTINE W REFLEX MICROSCOPIC - Abnormal; Notable for the following components:   APPearance HAZY (*)    Protein, ur 30 (*)    All other components within normal limits  MAGNESIUM - Abnormal; Notable for the following components:   Magnesium 1.6 (*)    All other components within normal limits  RESP PANEL BY RT-PCR (FLU A&B, COVID) ARPGX2  CBC WITH DIFFERENTIAL/PLATELET  TROPONIN I (HIGH SENSITIVITY)  TROPONIN I (HIGH SENSITIVITY)    EKG None  Radiology CT HEAD WO CONTRAST (5MM)  Result Date: 02/16/2022 CLINICAL DATA:  Head trauma, fall yesterday. EXAM: CT HEAD WITHOUT CONTRAST TECHNIQUE: Contiguous axial images were obtained from the base of the skull through the vertex without intravenous contrast. RADIATION DOSE REDUCTION: This exam was performed according to the departmental dose-optimization program which includes automated exposure control, adjustment of the mA and/or kV according to patient size and/or use of iterative reconstruction technique. COMPARISON:  05/28/2015. FINDINGS: Brain: No acute intracranial hemorrhage, midline shift or mass effect. No extra-axial fluid collection. Gray-white matter differentiation is  within normal limits. Mega cisterna magna is noted. No hydrocephalus. Vascular: No hyperdense vessel or unexpected calcification. Skull: Normal. Negative for fracture or focal lesion. Sinuses/Orbits: No acute finding. Other: None. IMPRESSION: No acute intracranial process. Electronically Signed   By: Brett Fairy M.D.   On: 02/16/2022 20:04   DG Chest 2 View  Result Date: 02/16/2022 CLINICAL DATA:  Reason for exam: SOB, chills, palpitations - best obtainable images. EXAM: CHEST - 2 VIEW COMPARISON:  07/04/2016 FINDINGS: Lungs are clear. Heart size and mediastinal contours are within normal limits. No effusion. Visualized bones unremarkable. IMPRESSION: No acute cardiopulmonary disease.  Electronically Signed   By: Lucrezia Europe M.D.   On: 02/16/2022 16:38    Procedures Procedures    Medications Ordered in ED Medications - No data to display  ED Course/ Medical Decision Making/ A&P    36 year old history of renal transplant 5 years ago here for evaluation of palpitations and shortness of breath.  Episode occurred yesterday as well as 1 episode today.  Had fall off commode yesterday.  No overt syncope.  No clinical evidence of VTE on exam.  He is PERC negative, Wells criteria low risk.  He has had some associated cough without congestion or rhinorrhea.  No sick contacts.  Eating drinking normally at home.  No emesis.  Will plan on labs and imaging  Labs and imaging personally viewed and interpreted:  CBC without leukocytosis UA neg for infection Mag 1.6 CMP creatinine 1.9 similar to prior [facility] labs COVID, Flu neg Trop 10>>9 CT head without acute abnormality  Reassessed.  Currently asymptomatic.  Low suspicion for acute ACS, PE, dissection, bacterial infectious process, pneumothorax, acute renal rejection.  Will have him follow-up outpatient.  Will have him return for new or worsening symptoms.  The patient has been appropriately medically screened and/or stabilized in the ED. I have low  suspicion for any other emergent medical condition which would require further screening, evaluation or treatment in the ED or require inpatient management.  Patient is hemodynamically stable and in no acute distress.  Patient able to ambulate in department prior to ED.  Evaluation does not show acute pathology that would require ongoing or additional emergent interventions while in the emergency department or further inpatient treatment.  I have discussed the diagnosis with the patient and answered all questions.  Pain is been managed while in the emergency department and patient has no further complaints prior to discharge.  Patient is comfortable with plan discussed in room and is stable for discharge at this time.  I have discussed strict return precautions for returning to the emergency department.  Patient was encouraged to follow-up with PCP/specialist refer to at discharge.                             Medical Decision Making Amount and/or Complexity of Data Reviewed Independent Historian:     Details: Family at bedside External Data Reviewed: labs, radiology, ECG and notes. Labs: ordered. Decision-making details documented in ED Course. Radiology: ordered and independent interpretation performed. Decision-making details documented in ED Course. ECG/medicine tests: ordered and independent interpretation performed. Decision-making details documented in ED Course.  Risk OTC drugs. Prescription drug management. Parenteral controlled substances. Decision regarding hospitalization. Diagnosis or treatment significantly limited by social determinants of health.         Final Clinical Impression(s) / ED Diagnoses Final diagnoses:  Palpitations    Rx / DC Orders ED Discharge Orders     None         Norlan Rann A, PA-C 02/16/22 2244    Hayden Rasmussen, MD 02/17/22 1046

## 2022-02-16 NOTE — ED Provider Triage Note (Addendum)
Emergency Medicine Provider Triage Evaluation Note  Austin Hardy , a 36 y.o. male  was evaluated in triage.  Pt complains of heart racing and palpitations.  He has a history of renal transplant.  Reports approximately 2 weeks of diarrhea.  States that he had sudden episode of racing heart today with associated lightheadedness and shortness of breath.  No fevers but has had chills.  Review of Systems  Positive: Palpitations, lightheadedness Negative: Fever  Physical Exam  BP (!) 136/96 (BP Location: Left Arm)   Pulse 93   Temp 98.8 F (37.1 C) (Oral)   Resp (!) 22   SpO2 100%  Gen:   Awake, no distress   Resp:  Normal effort  MSK:   Moves extremities without difficulty  Other:  Normal rate  Medical Decision Making  Medically screening exam initiated at 3:45 PM.  Appropriate orders placed.  [name] was informed that the remainder of the evaluation will be completed by another provider, this initial triage assessment does not replace that evaluation, and the importance of remaining in the ED until their evaluation is complete.  6:14 PM patient reassessed after having increasing chest pain.  States that he has a headache and now mentions that he fell and hit his head last night.  He shows me a small abrasion underneath his left eye.  Troponin was ordered and is currently being drawn.  Will also obtain CT head given reported head trauma.   [name], PA-C 02/16/22 1545    [name], PA-C 02/16/22 1815

## 2022-02-16 NOTE — ED Triage Notes (Signed)
Pt reports sudden onset of feeling that his heart is racing and being shob today. States he is a post transplant patient and has had about 2 weeks of n/v/d with chills  has followed up with his team about this and adjustments were made to his medications.

## 2022-02-16 NOTE — Discharge Instructions (Addendum)
Follow-up closely with primary care provider and transplant team  Return for new or worsening symptoms

## 2022-06-20 NOTE — Telephone Encounter (Signed)
Received VM from [facility] with primary nephrologist at [facility] in [place], requesting clinicals, labs, and med list x1 year. Documentation faxed as requested.     Primary Nephrologist: Dr. Brion Aliment with [facility]  Telephone: [phone]  Fax: [phone]  Electronically signed by: Elray Mcgregor, RN 06/20/2022 11:34 AM

## 2022-07-05 NOTE — Telephone Encounter (Signed)
Formatting of this note might be different from the original.  Pt called in stating that he is needing to get with his transplant dr but can't until his probation gets some information from the office about how bad his kidneys really are. She can get it pushed through but needs this information to be able to do it permanently and also for his insurance. The probation officers name is Laureen Ochs the number to her office is [phone] and her email address is [email or web address]. he would like for someone to give him a call and let him know about this. Thank you   Electronically signed by Reuben Likes at 07/05/2022  3:21 PM EDT
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
## 2022-07-08 NOTE — H&P (Signed)
Hospitalist Admission History and Physical       Chief Complaint   abdominal pain     HPI   Roberto Rice is a 37 y.o. 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      Patient describes abdominal pain throughout his lower abdomen that is worse in the right lower quadrant, crampy in nature.  This has been going on about a week since he was admitted at outside hospital found to have lower GI bleed.  Patient states that his stools have been frequent over the last few days since discharge on 4/9, at least 3 very loose bowel movements per day.  He has been trying to keep up with p.o. fluid intake.  Denies vomiting.  States that his stools are frequently dark.  He reports that his urine output has been slightly reduced and more foamy over the last few days.  Patient also reports associated headache, that he describes as persisting over the last week, primarily posterior, bilateral, worse with neck movement.    Denies fever, chills, chest pain, breath, dysuria, hematuria.  Denies taking any over-the-counter medicines in the interim.  Reports full adherence with medication regimen.    Patient went recently admitted at outside hospital 2023-07-28 - 4/9.  There, he initially presented with bloody BM, and underwent colonoscopy that per report revealed angiodysplasia that was treated.  His hemoglobin stabilized.  His creatinine was noted to be elevated from baseline.  Significant laboratory and imaging findings at outside hospital included hemoglobin of 11.9 on 4/8, hemoglobin of 12.4 on 4/9.  Creatinine was 2.7 on 4/9  [place] transplant kidney not show acute findings.  C. difficile was negative on 28-Jul-2023, stool bacteria panel including Salmonella, Shigella, E. coli, Campylobacter was negative.  Monoclonal  protein study showed slightly elevated kappa lambda ratio of 2.89 however was negative for monoclonal protein on immunofixation and electrophoresis  CMV IgM was negative  Supplement C3 and C4 were intact  Tacrolimus was therapeutic at 9.0 on 4/8    On arrival in the [facility] ED temperature was 97.9, heart rate 86, respiratory rate 18, blood pressure 132/90, saturating 100% on room air.  Labs showed CBC with WBC 8.0, hemoglobin 12.5, normocytic, mild lymphopenia, platelets 366.  CMP showed sodium 135, potassium 4.0, bicarb 18, creatinine 3.91, BUN 37.  Type and screen was collected, lipase was within normal limits  Stool Hemoccult in the ED was negative  Patient was treated with 1 L LR in the ED         Assessment and Plan        Principal Problem:    AKI (acute kidney injury) (CMS/HCC)  Resolved Problems:    * No resolved hospital problems. 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  -However, must also consider given immunosuppression, CMV  can frequently present with diarrhea.  Differential would also include other viral infection or subclinical bacterial infection.  -Alternatively, must consider possibility of rejection medication toxicity.  -Thorough workup at outside hospital about a week ago, included unremarkable ultrasound of transplanted kidneys, mild negative monoclonal protein study, negative CMV IgM.  -No obvious precipitating factor in outside notes in review of discharge  summary      -Additional 1 L LR followed by LR GTT  -Hold home Bactrim given AKI  -Hold prescription PPI as bleeding was in the colon, and association with AIN  -Repeat ultrasound of transplant kidneys  -Urinalysis with culture, urine sodium, urine protein creatinine ratio  -If creatinine not downtrending anticipate nephrology consult in the morning for assistance in worsening AKI, titrating antirejection meds, consideration of biopsy  -Renally dose medications  -Avoid nephrotoxic agents  -EBV, BK, CMV  -Tacrolimus trough  -Monitor BMP, mag, CBC, Phos daily  -Continue home tacrolimus, mycophenolate, prednisone  -Continue bicarb tablets  -Strict ins and outs, may need foley for exact output if oliguric  -Defer C. 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         History   Past Medical and Surgical History, Family History, Social History  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    Hepatitis     Hep C antibody positive    Hyperparathyroidism (CMS/HCC)     Hypertension     Hypertension with goal to be determined      Past Surgical History:   Procedure  Laterality Date    ANKLE FRACTURE SURGERY Left     Procedure: ANKLE FRACTURE SURGERY; plate/screw    AV FISTULA PLACEMENT Right     Procedure: AV FISTULA PLACEMENT    AV FISTULA REPAIR Right 11/22/2016    Procedure: A-V FISTULA LIGATION RIGHT WRIST;  Surgeon: Carroll Kinds, MD;  Location: [facility] MAIN OR;  Service: Vascular;  Laterality: Right;    KIDNEY TRANSPLANT N/A 07/04/2016    Procedure: KIDNEY TRANSPLANT CADAVERIC / EN BLOC / KGUR [id number];  Surgeon: Lyn Henri, MD;  Location: [facility] MAIN OR;  Service: Transplant;  Laterality: N/A;  Bench TF  Pt TF  Kidney / Box / From [facility] / 09:00  UNOS # [id number]    MASS EXCISION Left 05/02/2021    Procedure: LEFT HAND CYST EXCISION;  Surgeon: Naaman Plummer, MD;  Location: [facility] MINOR PROC;  Service: Orthopedics;  Laterality: Left;    OTHER SURGICAL HISTORY Right     Procedure: OTHER SURGICAL HISTORY (dialysis permcath); removed  Family History   Problem Relation Name Age of Onset    Cervical cancer Mother      Ovarian cancer Maternal Grandmother      Breast cancer Maternal Aunt      Prostate cancer Maternal Uncle      Anesthesia problems Neg Hx       Social History     Socioeconomic History    Marital status: Single     Spouse name: Not on file    Number of children: Not on file    Years of education: Not on file    Highest education level: Not on file   Occupational History    Not on file   Tobacco Use    Smoking status: Former     Current packs/day: 0.00     Types: Cigarettes     Quit date: 03/26/2009     Years since quitting: 13.2    Smokeless tobacco: Never   Substance and Sexual Activity    Alcohol use: No    Drug use: No    Sexual activity: Not on file   Other Topics Concern    Not on file   Social History Narrative    Disability. engaged     Social Determinants of Health     Food Insecurity: Not on file   Transportation Needs: Not on file   Safety: Not on file   Living Situation: Not on file          Allergies   Allergies   Allergen Reactions     Nsaids (Non-Steroidal Anti-Inflammatory Drug) Other (See Comments)     CANNOT TAKE DUE TO KIDNEY FAILURE         Home Medications   Home Medications              * cholecalciferol (VITAMIN D3) 2,000 unit cap capsule     Take 1 capsule by mouth daily.    * isosorbide mononitrate (IMDUR) 30 mg 24 hr tablet     TAKE 1 TABLET BY MOUTH DAILY.    * Myfortic 180 mg TbEC DR tablet     Take 4 tablets (720 mg total) by mouth 2 times daily.    * NIFEdipine (PROCARDIA XL) 60 mg 24 hr tablet     TAKE 1 TABLET BY MOUTH 2 TIMES DAILY.    * predniSONE (DELTASONE) 5 mg tablet     TAKE 1 TABLET BY MOUTH DAILY.    * Prograf 1 mg capsule     Take 5 capsules (5 mg total) by mouth 2 times daily.    * sulfamethoxazole-trimethoprim (BACTRIM) 400-80 mg per tablet     TAKE 1 TABLET BY MOUTH 3 TIMES A WEEK.              Medications  Medication Sig Dispensed Refills Start Date End Date Status   NIFEdipine (PROCARDIA) 10 mg capsule  Take 10 mg by mouth Twice a day.   0     Active   tacrolimus (PROGRAF) 5 mg  Take 5 mg by mouth Every 12 hours.   0     Active   Mycophenolate Sodium (MYFORTIC) 360 mg TbEC  Take 720 mg by mouth Twice a day.   0     Active   predniSONE (DELTASONE) 10 mg tablet  Take 5 mg by mouth Once Daily.   0     Active   sulfamethoxazole-trimethoprim (BACTRIM DS) 800-160 mg per tablet  Take 1 Tablet by mouth Twice  a day.   0     Active   ondansetron HCL (ZOFRAN) 4 mg tablet   Indications: Functional diarrhea, Nausea, Cough, Fever Take 1 Tablet by mouth Every 6 hours as needed for Nausea. 20 Tablet  0 06/11/2022   Active   isosorbide dinitrate (ISORDIL) 20 mg tablet  Take 20 mg by mouth Twice a day. X3 weekly   0     Active   calcium carbonate/vitamin D3 (VITAMIN D-3 PO)  Take by mouth Once Daily.   0     Active   sodium bicarbonate 650 mg tablet   Indications: Metabolic acidosis Take 2 Tabs by mouth Twice a day for 10 days. 40 Tablet  0 07/03/2022 07/13/2022 Active   pantoprazole (PROTONIX) 40 mg DR tablet   Indications: Rectal  bleeding Take 1 Tablet by mouth Once Daily for 30 days. 30 Tablet  0 07/03/2022 08/02/2022 Active            Review of Systems   Pertinent items are noted in HPI.       Physical Exam   Temp:  [97.9 F (36.6 C)-98.7 F (37.1 C)] 98.7 F (37.1 C)  Heart Rate:  [74-86] 80  Resp:  [18] 18  BP: (122-132)/(85-90) 122/85  Body mass index is 29.29 kg/m.    Constitutional: NAD, Alert, patient is pleasant and cooperative  HEENT: Conjunctivae and lids normal, EOMI, midly dry MM  CV: RRR no M,R,G. No elevated JVP appreciated.   RESP: Good air movement. Clear to auscultation bilaterally. No wheezes or crackles. Normal respiratory effort  GI: Soft, mild-moderate RLQ tenderness, mild suprapubic and periumbilical tenderness,  Non-distended. No rebound or guarding. Remote transplant scar.   Extremities:  No peripheral edema.   MSK: Mild tenderness bilateral cervical paraspinal musculature. Normal muscle bulk and tone  Skin: No obvious lesions or skin abnormalities.  Neuro: II - XII grossly intact, no gross focal deficit  Psych: Appropriate affect  Heme/ Lymph: No petechiae or atypical lymphadenopathy noted.       Labs and Results   I have reviewed the following labs and results:    Labs:  Recent Results (from the past 24 hour(s))   Comprehensive Metabolic Panel    Collection Time: 07/08/22 12:55 AM   Result Value Ref Range    Sodium 135 (L) 136 - 145 mmol/L    Potassium 4.0 3.5 - 5.1 mmol/L    Chloride 105 98 - 107 mmol/L    CO2 18 (L) 21 - 31 mmol/L    Anion Gap 12 6 - 14 mmol/L    Glucose, Random 90 70 - 99 mg/dL    Blood Urea Nitrogen (BUN) 37 (H) 7 - 25 mg/dL    Creatinine 0.10 (H) 0.70 - 1.30 mg/dL    eGFR 19 (L) >27 OZ/DGU/4.40H4    Albumin 4.9 3.5 - 5.7 g/dL    Total Protein 8.3 6.4 - 8.9 g/dL    Bilirubin, Total 0.8 0.3 - 1.0 mg/dL    Alkaline Phosphatase (ALP) 99 34 - 104 U/L    Aspartate Aminotransferase (AST) 14 13 - 39 U/L    Alanine Aminotransferase (ALT) 11 7 - 52 U/L    Calcium 9.8 8.6 - 10.3 mg/dL     BUN/Creatinine Ratio 9.5 (L) 10.0 - 20.0   Lipase    Collection Time: 07/08/22 12:55 AM   Result Value Ref Range    Lipase 17 11 - 82 U/L   Type and screen    Collection Time:  07/08/22 12:55 AM   Result Value Ref Range    Component Type RED CELLS     Crossmatch Expiration 07-11-2022,2359     Type & Rh (ABORH) O Positive     Antibody Screen NEG    CBC with Differential    Collection Time: 07/08/22 12:55 AM   Result Value Ref Range    WBC 8.00 4.40 - 11.00 10*3/uL    RBC 3.96 (L) 4.50 - 5.90 10*6/uL    Hemoglobin 12.5 (L) 14.0 - 17.5 g/dL    Hematocrit 75.6 (L) 41.5 - 50.4 %    Mean Corpuscular Volume (MCV) 94.7 80.0 - 96.0 fL    Mean Corpuscular Hemoglobin (MCH) 31.7 27.5 - 33.2 pg    Mean Corpuscular Hemoglobin Conc (MCHC) 33.5 33.0 - 37.0 g/dL    Red Cell Distribution Width (RDW) 14.1 12.3 - 17.0 %    Platelet Count (PLT) 366 150 - 450 10*3/uL    Mean Platelet Volume (MPV) 7.5 6.8 - 10.2 fL    Neutrophils % 84 %    Lymphocytes % 11 %    Monocytes % 5 %    Eosinophils % 0 %    Basophils % 1 %    nRBC % 0 %    Neutrophils Absolute 6.70 1.80 - 7.80 10*3/uL    Lymphocytes # 0.90 (L) 1.00 - 4.80 10*3/uL    Monocytes # 0.40 0.00 - 0.80 10*3/uL    Eosinophils # 0.00 0.00 - 0.50 10*3/uL    Basophils # 0.00 0.00 - 0.20 10*3/uL    nRBC Absolute 0.00 <=0.00 10*3/uL   Patient ABO/Rh Recheck    Collection Time: 07/08/22 12:55 AM   Result Value Ref Range    Type & Rh (ABORH) O Positive        Micro:  No results found for this visit on 07/08/22 (from the past 48 hour(s)).    Radiology:  No orders to display       EKG:  No results found for this visit on 07/08/22.       Elevated complexity based on acute condition posing threat to bodily function, medication requiring intensive monitoring for toxicity    Electronically signed by: Achille Rich, MD 07/08/2022 3:24 AM      *Some images could not be shown.

## 2022-07-09 NOTE — Telephone Encounter (Signed)
Formatting of this note might be different from the original.  Called and LVM for patient to call office regarding physicians note.    Estill Batten    Electronically signed by Floreen Comber at 07/09/2022  4:33 PM EDT

## 2022-07-09 NOTE — Progress Notes (Signed)
Transplant Progress Note   LOS: 1 day   Transplant Date: 07/04/2016   Transplant Type: DDRTpeds-en-bloc     Subjective:    24 Hour Events: No acute events overnight. Remained HDS and afebrile. Continues to have loose bowel movements. Denies pain, nausea, vomiting.      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    Hepatitis     Hep C antibody positive    Hyperparathyroidism (CMS/HCC)     Hypertension     Hypertension with goal to be determined      Scheduled Meds:heparin (porcine), 5,000 Units, subcutaneous, Q8H SCH  lidocaine, 2 patch, topical, Daily  methocarbamoL, 500 mg, oral, TID  mycophenolate, 720 mg, oral, BID  NIFEdipine, 10 mg, oral, BID  predniSONE, 5 mg, oral, Daily  sodium bicarbonate, 650 mg, oral, 4x Daily  tacrolimus, 5 mg, oral, BID      Continuous Infusions:lactated ringer's, 100 mL/hr, Last Rate: 100 mL/hr (07/09/22 0543)      PRN Meds:  acetaminophen    dextrose    dextrose    melatonin    ondansetron    Objective:  Vital signs in last 24 hours:  Temp:  [97.9 F (36.6 C)-98.9 F (37.2 C)] 98.9 F (37.2 C)  Heart Rate:  [68-92] 74  Resp:  [16-18] 17  BP: (136-161)/(91-100) 147/97  Intake/Output last 3 shifts:  I/O last 3 completed shifts:  In: 476 [P.O.:476]  Out: 2000 [Urine:2000]  Intake/Output this shift:  I/O this shift:  In: 0   Out: 425 [Urine:425]    Labs:  Recent Results (from the past 24 hour(s))   Basic Metabolic Panel    Collection Time: 07/09/22  4:15 AM   Result Value Ref Range    Sodium 137 136 - 145 mmol/L    Potassium 4.1 3.5 - 5.1 mmol/L    Chloride 108 (H) 98 - 107 mmol/L    CO2 21 21 - 31 mmol/L    Anion Gap 8 6 - 14 mmol/L    Glucose, Random 81 70 - 99 mg/dL    Blood Urea Nitrogen (BUN) 32 (H) 7 - 25 mg/dL    Creatinine 2.95 (H) 0.70 - 1.30 mg/dL    eGFR 23 (L) >62 ZH/YQM/5.78I6    Calcium 9.0 8.6 - 10.3 mg/dL    BUN/Creatinine Ratio 9.3 (L) 10.0 - 20.0   Magnesium    Collection Time: 07/09/22   4:15 AM   Result Value Ref Range    Magnesium 2.0 1.9 - 2.7 mg/dL   Phosphorus    Collection Time: 07/09/22  4:15 AM   Result Value Ref Range    Phosphorus 3.3 2.5 - 5.0 mg/dL   CBC without Differential    Collection Time: 07/09/22  4:15 AM   Result Value Ref Range    WBC 5.80 4.40 - 11.00 10*3/uL    RBC 3.60 (L) 4.50 - 5.90 10*6/uL    Hemoglobin 11.6 (L) 14.0 - 17.5 g/dL    Hematocrit 96.2 (L) 41.5 - 50.4 %    Mean Corpuscular Volume (MCV) 95.6 80.0 - 96.0 fL    Mean Corpuscular Hemoglobin (MCH) 32.4 27.5 - 33.2 pg    Mean Corpuscular Hemoglobin Conc (MCHC) 33.9 33.0 - 37.0 g/dL    Red Cell Distribution Width (RDW) 13.7 12.3 - 17.0 %    Platelet Count (PLT) 329 150 - 450 10*3/uL    Mean Platelet  Volume (MPV) 7.5 6.8 - 10.2 fL     Other lab/culture results:    Diagnostic Studies:    US Kidney Transplant W Doppler Dual   Final Result by Johnston Ebbs, MD [id number])   US KIDNEY TRANSPLANT W DOPPLER DUAL, 07/08/2022 5:18 AM      INDICATION:  AKI of graft    COMPARISON: MRI 02/12/2021      TECHNIQUE: Multi-planar real-time ultrasonography of the transplanted    kidneys using grayscale imaging, supplemented by color and spectral    Doppler as needed.      FINDINGS:      ANATOMIC:   .  Transplanted kidneys are located in the right lower quadrant.   .  Lateral kidney Size = 11.6 x 7.2 x 6.5 cm.  Estimated volume = 286    cm^3.   .  Medial kidney Size = 11 x 5.2 x 5.3 cm.  Estimated volume = 158 cm^3.   .  The kidneys are normal in contour and echogenicity. No suspicious focal    mass is identified. No evidence of stone formation.   .  No hydronephrosis.    .  Peritransplant (deep) collection: None.   .  Subincisional (superficial) collection: None.   .  Ureter and bladder: Unremarkable.      VASCULAR kidney 1 (lateral):   Resistive indices as obtained from the arcuate arteries are as follows:   0.67, 0.71 in the upper pole   0.64, 0.67 in the interpolar region   0.68, 0.67 in the lower pole   Resistive indices are  within normal range.      TRANSPLANT RENAL ARTERY:   .  Peak systolic velocity estimates:   .  Proximal: 87 cm/sec   .  Mid: 65 cm/sec   .  Distal: 65 cm/sec   .  Waveform: Low resistance.      TRANSPLANT RENAL VEIN:   .  Patent.      VASCULAR kidney 2 (medial):   Resistive indices as obtained from the arcuate arteries are as follows:   0.6, 0.63 in the upper pole   0.65, 0.62 in the interpolar region   0.6, 0.62 in the lower pole   Resistive indices are within normal range.      TRANSPLANT RENAL ARTERY #1:   .  Peak systolic velocity estimates:   .  Proximal: 64 cm/sec   .  Mid: 69 cm/sec   .  Distal: 31 cm/sec   .  Waveform: Low resistance.      TRANSPLANT RENAL ARTERY #2:   .  Peak systolic velocity estimates:   .  Proximal: 131 cm/sec   .  Mid: 95 cm/sec   .  Distal: 159 cm/sec   .  Waveform: Low resistance.         TRANSPLANT RENAL VEIN:   .  Patent.         ILIAC ARTERY:    .  Proximal to anastomosis: 90 cm/sec   .  Distal to anastomosis: 63 cm/sec      ADDITIONAL COMMENTS: None.               1.  Status post en bloc renal transplant in the right lower quadrant.    Transplant vasculature is patent with normal range resistive indices.   2.  No hydronephrosis.   3.  No perinephric fluid collections.          Physical Exam:  Vitals:    07/08/22 2227  07/08/22 2248 07/09/22 0352 07/09/22 0719   BP: (!) 142/94 (!) 146/99 (!) 139/93 (!) 147/97   BP Location:  Left arm Left arm Left arm   Patient Position:  Lying Lying Lying   Pulse: 68 73 69 74   Resp:  18 18 17    Temp:  98.2 F (36.8 C) 98.1 F (36.7 C) 98.9 F (37.2 C)   TempSrc:  Oral Oral Oral   SpO2:  98% 98% 100%   Weight:       Height:         Body mass index is 29.29 kg/m.  Wt Readings from Last 3 Encounters:   07/07/22 95.3 kg (210 lb)   12/19/21 101 kg (222 lb 10.6 oz)   09/05/21 110 kg (242 lb 8.1 oz)     Physical Exam:   General Appearance:  37 y.o. alert and oriented x 3. No acute distress.   Head:  Normocephalic, atraumatic   Eyes:  Conjunctivae  clear. No scleral icterus.   Neck: Neck supple, trachea midline.   Throat:  Moist mucous membranes;  No thrush or ulcers.   Chest/Lungs:  Respirations unlabored. Oxygenating well on RA   Heart:  Regular rate and rhythm,   Abdomen:  Soft, non-distended, mildly tender to palpation in LUQ and RLQ   Allograft:  RLQ renal allograft nontender, no bruits, incision healing well.     Extremities:  Full ROM. No cyanosis or edema   Skin:  Good skin turgor. No rashes or lesions.     Neurologic:  No focal deficits. No tremor.    Psych:   Judgement and memory intact.  Affect appropriate.       Assessment:  Principal Problem:    AKI (acute kidney injury) (CMS/HCC)     Roberto Rice is a 37 y.o. 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 Workup currently pending to rule out infectious causes.     Recommendations:  - Recommend IR consult for biopsy of transplant kidney to evaluate   - GIP panel negative, will follow up additional infectious workup  - Continue Fluid Resuscitation.  - Continue home immunosuppression   - Recommend Counseling or Chaplain Consult to help patient cope with chronic illness  - Please page [facility] Renal Transplant team with any questions or concerns.      Electronically signed by: Regenia Skeeter, MD 07/09/2022 8:10 AM    I saw and evaluated the patient and reviewed the resident's note. I agree with the resident's findings and plan.  I saw and evaluated the patient and I reviewed meds, labs, and determined immunosuppression dosing.    Patient with history of renal transplant and AR; recent illness with AKI but creatinine slow to recover though he clinically appears improved. Recommend biopsy of kidney to rule out acute rejection.    Jefm Miles, MD    Valla Leaver.Mortimer Fries M.D., Ph.D.

## 2022-07-09 NOTE — Telephone Encounter (Signed)
Formatting of this note might be different from the original.  Please advise the patient to contact medical records and sign a release of information for his probation officer and request that the records be sent to her directly for reviewed. He could also just obtain the records himself and share them with his probation officer.     Thanks,   Delfina Redwood APRN-CNP  Electronically signed by Delfina Redwood, APRN at 07/09/2022  4:09 PM EDT

## 2022-07-10 NOTE — Progress Notes (Signed)
Transplant Progress Note   LOS: 2 days   Transplant Date: 07/04/2016   Transplant Type: DDRTpeds-en-bloc     Subjective:    24 Hour Events: No acute events overnight. Remained HDS and afebrile. Loose BM resolving. Denies pain, nausea, vomiting. Patient has noticed his BP has been elevated, currently on hydralazine, nifedipine for BP control. IR consult pending.      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    Hepatitis     Hep C antibody positive    Hyperparathyroidism (CMS/HCC)     Hypertension     Hypertension with goal to be determined      Scheduled Meds:heparin (porcine), 5,000 Units, subcutaneous, Q8H SCH  hydrALAZINE, 10 mg, oral, Q8H SCH  lidocaine, 2 patch, topical, Daily  magnesium oxide, 400 mg, oral, Once  methocarbamoL, 500 mg, oral, TID  mycophenolate, 720 mg, oral, BID  NIFEdipine, 10 mg, oral, BID  predniSONE, 5 mg, oral, Daily  sodium bicarbonate, 650 mg, oral, 4x Daily  sodium chloride (bolus), 500 mL, intravenous, Q4H  tacrolimus, 5 mg, oral, BID      Continuous Infusions:     PRN Meds:  acetaminophen    dextrose    dextrose    melatonin    prochlorperazine    Objective:  Vital signs in last 24 hours:  Temp:  [97.9 F (36.6 C)-98.7 F (37.1 C)] 98.1 F (36.7 C)  Heart Rate:  [64-81] 64  Resp:  [18] 18  BP: (141-164)/(101-117) 155/117  Intake/Output last 3 shifts:  I/O last 3 completed shifts:  In: 480 [P.O.:480]  Out: 2775 [Urine:2775]  Intake/Output this shift:  No intake/output data recorded.    Labs:  Recent Results (from the past 24 hour(s))   Basic Metabolic Panel    Collection Time: 07/10/22  3:36 AM   Result Value Ref Range    Sodium 137 136 - 145 mmol/L    Potassium 3.9 3.5 - 5.1 mmol/L    Chloride 109 (H) 98 - 107 mmol/L    CO2 18 (L) 21 - 31 mmol/L    Anion Gap 10 6 - 14 mmol/L    Glucose, Random 75 70 - 99 mg/dL    Blood Urea Nitrogen (BUN) 29 (H) 7 - 25 mg/dL    Creatinine 9.51 (H) 0.70 - 1.30 mg/dL     eGFR 25 (L) >88 CZ/YSA/6.30Z6    Calcium 8.9 8.6 - 10.3 mg/dL    BUN/Creatinine Ratio 9.1 (L) 10.0 - 20.0   Magnesium    Collection Time: 07/10/22  3:36 AM   Result Value Ref Range    Magnesium 1.7 (L) 1.9 - 2.7 mg/dL   Phosphorus    Collection Time: 07/10/22  3:36 AM   Result Value Ref Range    Phosphorus 3.0 2.5 - 5.0 mg/dL   CBC without Differential    Collection Time: 07/10/22  3:36 AM   Result Value Ref Range    WBC 5.80 4.40 - 11.00 10*3/uL    RBC 3.70 (L) 4.50 - 5.90 10*6/uL    Hemoglobin 11.8 (L) 14.0 - 17.5 g/dL    Hematocrit 01.0 (L) 41.5 - 50.4 %    Mean Corpuscular Volume (MCV) 94.5 80.0 - 96.0 fL    Mean Corpuscular Hemoglobin (MCH) 32.0 27.5 - 33.2 pg    Mean Corpuscular Hemoglobin Conc (MCHC) 33.9 33.0 - 37.0 g/dL    Red Cell Distribution Width (  RDW) 13.7 12.3 - 17.0 %    Platelet Count (PLT) 328 150 - 450 10*3/uL    Mean Platelet Volume (MPV) 7.9 6.8 - 10.2 fL     Other lab/culture results:    Diagnostic Studies:    US Kidney Transplant W Doppler Dual   Final Result by Johnston Ebbs, MD [phone])   US KIDNEY TRANSPLANT W DOPPLER DUAL, 07/08/2022 5:18 AM      INDICATION:  AKI of graft    COMPARISON: MRI 02/12/2021      TECHNIQUE: Multi-planar real-time ultrasonography of the transplanted    kidneys using grayscale imaging, supplemented by color and spectral    Doppler as needed.      FINDINGS:      ANATOMIC:   .  Transplanted kidneys are located in the right lower quadrant.   .  Lateral kidney Size = 11.6 x 7.2 x 6.5 cm.  Estimated volume = 286    cm^3.   .  Medial kidney Size = 11 x 5.2 x 5.3 cm.  Estimated volume = 158 cm^3.   .  The kidneys are normal in contour and echogenicity. No suspicious focal    mass is identified. No evidence of stone formation.   .  No hydronephrosis.    .  Peritransplant (deep) collection: None.   .  Subincisional (superficial) collection: None.   .  Ureter and bladder: Unremarkable.      VASCULAR kidney 1 (lateral):   Resistive indices as obtained from the  arcuate arteries are as follows:   0.67, 0.71 in the upper pole   0.64, 0.67 in the interpolar region   0.68, 0.67 in the lower pole   Resistive indices are within normal range.      TRANSPLANT RENAL ARTERY:   .  Peak systolic velocity estimates:   .  Proximal: 87 cm/sec   .  Mid: 65 cm/sec   .  Distal: 65 cm/sec   .  Waveform: Low resistance.      TRANSPLANT RENAL VEIN:   .  Patent.      VASCULAR kidney 2 (medial):   Resistive indices as obtained from the arcuate arteries are as follows:   0.6, 0.63 in the upper pole   0.65, 0.62 in the interpolar region   0.6, 0.62 in the lower pole   Resistive indices are within normal range.      TRANSPLANT RENAL ARTERY #1:   .  Peak systolic velocity estimates:   .  Proximal: 64 cm/sec   .  Mid: 69 cm/sec   .  Distal: 31 cm/sec   .  Waveform: Low resistance.      TRANSPLANT RENAL ARTERY #2:   .  Peak systolic velocity estimates:   .  Proximal: 131 cm/sec   .  Mid: 95 cm/sec   .  Distal: 159 cm/sec   .  Waveform: Low resistance.         TRANSPLANT RENAL VEIN:   .  Patent.         ILIAC ARTERY:    .  Proximal to anastomosis: 90 cm/sec   .  Distal to anastomosis: 63 cm/sec      ADDITIONAL COMMENTS: None.               1.  Status post en bloc renal transplant in the right lower quadrant.    Transplant vasculature is patent with normal range resistive indices.   2.  No hydronephrosis.   3.  No perinephric fluid collections.          Physical Exam:  Vitals:    07/09/22 1915 07/09/22 2229 07/10/22 0251 07/10/22 0722   BP: (!) 141/101 (!) 156/106 (!) 164/102 (!) 155/117   BP Location: Left arm Left arm Left arm Left arm   Patient Position: Lying Lying Lying Lying   Pulse: 81 76 78 64   Resp: 18 18 18 18    Temp: 98.4 F (36.9 C) 98.2 F (36.8 C) 97.9 F (36.6 C) 98.1 F (36.7 C)   TempSrc: Oral Oral Oral Oral   SpO2: 100% 100% 98% 100%   Weight:       Height:         Body mass index is 29.29 kg/m.  Wt Readings from Last 3 Encounters:   07/07/22 95.3 kg (210 lb)   12/19/21 101 kg  (222 lb 10.6 oz)   09/05/21 110 kg (242 lb 8.1 oz)     Physical Exam:   General Appearance:  37 y.o. alert and oriented x 3. No acute distress.   Head:  Normocephalic, atraumatic   Eyes:  Conjunctivae clear. No scleral icterus.   Neck: Neck supple, trachea midline.   Throat:  Moist mucous membranes;  No thrush or ulcers.   Chest/Lungs:  Respirations unlabored. Oxygenating well on RA   Heart:  Regular rate and rhythm,   Abdomen:  Soft, non-distended, mildly tender to palpation in LUQ and RLQ   Allograft:  RLQ renal allograft nontender, no bruits, incision healing well.     Extremities:  Full ROM. No cyanosis or edema   Skin:  Good skin turgor. No rashes or lesions.     Neurologic:  No focal deficits. No tremor.    Psych:   Judgement and memory intact.  Affect appropriate.       Assessment:  Principal Problem:    AKI (acute kidney injury) (CMS/HCC)     Roberto Rice is a 37 y.o. 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 Workup currently pending to rule out infectious causes.     Recommendations:  - Recommend radiology biopsy of transplant kidney to evaluate for possible rejection  - GIP panel negative, Parasite panel negative, BK Virus/CMV/EBV negative, RVP negative, urine culture with no growth  - Continue Fluid Resuscitation.  - Continue home immunosuppression   - Recommend Counseling or Chaplain Consult to help patient cope with chronic illness  - Please page [facility] Renal Transplant team with any questions or concerns.      Electronically signed by: Regenia Skeeter, MD 07/10/2022 7:47 AM    I saw and evaluated the patient and reviewed the resident's note. I agree with the resident's findings and plan.  Renal function remains slow to normalize after recent illness; along with past history of AR would do biopsy to assess for AR.  Jefm Miles,  MD    Valla Leaver.Mortimer Fries M.D., Ph.D.

## 2022-07-10 NOTE — Telephone Encounter (Signed)
Formatting of this note might be different from the original.  Left message on voice mail to call office  Lutricia Feil, MA    Electronically signed by Lutricia Feil, MA at 07/10/2022  8:21 AM EDT

## 2022-07-10 NOTE — Telephone Encounter (Signed)
Formatting of this note might be different from the original.  Spoke with patient(DOB verified) regarding physician note, he's currently admitted at [facility], so he's going to sign a medical release form there so we can get his medical records.  Ladona Mow  Electronically signed by Ladona Mow, LPN at 16/12/9602 10:59 AM EDT

## 2022-07-11 NOTE — Nursing Note (Addendum)
Pt tolerated procedure without difficulty. See MAR for medication administrations, flowsheet for VS.    Per Dr. Raynelle Fanning, pt to lay flat until 1045 and walking restricted until 1145.    Pt received via IV:  1 mg of midazolam  50 mcg of fentanyl    Pt returned back to IR diagnostic holding for recovery. Report given to floor RN Usha via telephone call at 1004.

## 2022-07-12 NOTE — Progress Notes (Signed)
Transplant Progress Note   LOS: 4 days   Transplant Date: 07/04/2016   Transplant Type: DDRT peds-en-bloc     Subjective:  Feels improved, ready for discharge    24 Hour Events: No acute events overnight. Remained HDS and afebrile. Loose BM resolved. Denies pain, nausea, vomiting. IR biopsy obtained and pending path results. Cr has remained largely unchanged.      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    Hepatitis     Hep C antibody positive    Hyperparathyroidism (CMS/HCC)     Hypertension     Hypertension with goal to be determined      Scheduled Meds:[Held by provider] heparin (porcine), 5,000 Units, subcutaneous, Q8H SCH  lidocaine, 2 patch, topical, Daily  methocarbamoL, 500 mg, oral, TID  methylPREDNISolone sodium succinate, 500 mg, intravenous, Q24H SCH  mycophenolate, 720 mg, oral, BID  NIFEdipine, 60 mg, oral, BID  predniSONE, 5 mg, oral, Daily  sodium bicarbonate, 650 mg, oral, 4x Daily  tacrolimus, 5 mg, oral, BID      Continuous Infusions:immune globulin pharmacy to adjust per protocol, 1 g/kg (Ideal)        PRN Meds:  acetaminophen    dextrose    dextrose    immune globulin pharmacy to adjust per protocol    melatonin    prochlorperazine    Objective:  Vital signs in last 24 hours:  Temp:  [98.7 F (37.1 C)-99 F (37.2 C)] 98.7 F (37.1 C)  Heart Rate:  [89-104] 90  Resp:  [18-19] 18  BP: (137-173)/(80-109) 140/80  Intake/Output last 3 shifts:  I/O last 3 completed shifts:  In: 1480 [P.O.:1480]  Out: 1200 [Urine:1200]  Intake/Output this shift:  I/O this shift:  In: 240 [P.O.:240]  Out: -     Labs:  Recent Results (from the past 24 hour(s))   Tacrolimus Level, Whole Blood    Collection Time: 07/12/22  6:08 AM   Result Value Ref Range    Tacrolimus, Whole Blood 9.4 3.0 - 15.0 ng/mL   Basic Metabolic Panel    Collection Time: 07/12/22  6:08 AM   Result Value Ref Range    Sodium 137 136 - 145 mmol/L    Potassium 3.9  3.5 - 5.1 mmol/L    Chloride 107 98 - 107 mmol/L    CO2 19 (L) 21 - 31 mmol/L    Anion Gap 11 6 - 14 mmol/L    Glucose, Random 83 70 - 99 mg/dL    Blood Urea Nitrogen (BUN) 29 (H) 7 - 25 mg/dL    Creatinine 1.61 (H) 0.70 - 1.30 mg/dL    eGFR 25 (L) >09 UE/AVW/0.98J1    Calcium 9.4 8.6 - 10.3 mg/dL    BUN/Creatinine Ratio 9.2 (L) 10.0 - 20.0   CBC without Differential    Collection Time: 07/12/22  6:08 AM   Result Value Ref Range    WBC 6.20 4.40 - 11.00 10*3/uL    RBC 3.82 (L) 4.50 - 5.90 10*6/uL    Hemoglobin 12.3 (L) 14.0 - 17.5 g/dL    Hematocrit 91.4 (L) 41.5 - 50.4 %    Mean Corpuscular Volume (MCV) 95.5 80.0 - 96.0 fL    Mean Corpuscular Hemoglobin (MCH) 32.1 27.5 - 33.2 pg    Mean Corpuscular Hemoglobin Conc (MCHC) 33.6 33.0 - 37.0 g/dL    Red Cell Distribution Width (RDW) 13.6 12.3 -  17.0 %    Platelet Count (PLT) 341 150 - 450 10*3/uL    Mean Platelet Volume (MPV) 7.2 6.8 - 10.2 fL     Other lab/culture results:    Diagnostic Studies:    US Kidney Transplant W Doppler Dual   Final Result by Johnston Ebbs, MD [phone])   US KIDNEY TRANSPLANT W DOPPLER DUAL, 07/08/2022 5:18 AM      INDICATION:  AKI of graft    COMPARISON: MRI 02/12/2021      TECHNIQUE: Multi-planar real-time ultrasonography of the transplanted    kidneys using grayscale imaging, supplemented by color and spectral    Doppler as needed.      FINDINGS:      ANATOMIC:   .  Transplanted kidneys are located in the right lower quadrant.   .  Lateral kidney Size = 11.6 x 7.2 x 6.5 cm.  Estimated volume = 286    cm^3.   .  Medial kidney Size = 11 x 5.2 x 5.3 cm.  Estimated volume = 158 cm^3.   .  The kidneys are normal in contour and echogenicity. No suspicious focal    mass is identified. No evidence of stone formation.   .  No hydronephrosis.    .  Peritransplant (deep) collection: None.   .  Subincisional (superficial) collection: None.   .  Ureter and bladder: Unremarkable.      VASCULAR kidney 1 (lateral):   Resistive indices as obtained  from the arcuate arteries are as follows:   0.67, 0.71 in the upper pole   0.64, 0.67 in the interpolar region   0.68, 0.67 in the lower pole   Resistive indices are within normal range.      TRANSPLANT RENAL ARTERY:   .  Peak systolic velocity estimates:   .  Proximal: 87 cm/sec   .  Mid: 65 cm/sec   .  Distal: 65 cm/sec   .  Waveform: Low resistance.      TRANSPLANT RENAL VEIN:   .  Patent.      VASCULAR kidney 2 (medial):   Resistive indices as obtained from the arcuate arteries are as follows:   0.6, 0.63 in the upper pole   0.65, 0.62 in the interpolar region   0.6, 0.62 in the lower pole   Resistive indices are within normal range.      TRANSPLANT RENAL ARTERY #1:   .  Peak systolic velocity estimates:   .  Proximal: 64 cm/sec   .  Mid: 69 cm/sec   .  Distal: 31 cm/sec   .  Waveform: Low resistance.      TRANSPLANT RENAL ARTERY #2:   .  Peak systolic velocity estimates:   .  Proximal: 131 cm/sec   .  Mid: 95 cm/sec   .  Distal: 159 cm/sec   .  Waveform: Low resistance.         TRANSPLANT RENAL VEIN:   .  Patent.         ILIAC ARTERY:    .  Proximal to anastomosis: 90 cm/sec   .  Distal to anastomosis: 63 cm/sec      ADDITIONAL COMMENTS: None.               1.  Status post en bloc renal transplant in the right lower quadrant.    Transplant vasculature is patent with normal range resistive indices.   2.  No hydronephrosis.   3.  No perinephric fluid collections.  Physical Exam:  Vitals:    07/11/22 1641 07/11/22 1924 07/11/22 2306 07/12/22 0706   BP: (!) 145/95 (!) 137/101 143/86 140/80   BP Location: Left arm Left arm Right arm Right arm   Patient Position: Lying Sitting Lying Lying   Pulse: 104 93 95 90   Resp: 18 19 19 18    Temp:  99 F (37.2 C) 99 F (37.2 C) 98.7 F (37.1 C)   TempSrc:  Oral Oral Oral   SpO2:  100% 99% 98%   Weight:       Height:         Body mass index is 29.29 kg/m.  Wt Readings from Last 3 Encounters:   07/07/22 95.3 kg (210 lb)   12/19/21 101 kg (222 lb 10.6 oz)   09/05/21  110 kg (242 lb 8.1 oz)     Physical Exam:   General Appearance:  37 y.o. alert and oriented x 3. No acute distress.   Head:  Normocephalic, atraumatic   Eyes:  Conjunctivae clear. No scleral icterus.   Neck: Neck supple, trachea midline.   Throat:  Moist mucous membranes;  No thrush or ulcers.   Chest/Lungs:  Respirations unlabored. Oxygenating well on RA   Heart:  Regular rate and rhythm,   Abdomen:  Soft, non-distended, mildly tender to palpation in LUQ and RLQ   Allograft:  RLQ renal allograft nontender    Extremities:  Full ROM. No cyanosis or edema   Skin:  Good skin turgor. No rashes or lesions.     Neurologic:  No focal deficits. No tremor.    Psych:   Judgement and memory intact.  Affect appropriate.       Assessment:  Principal Problem:    AKI (acute kidney injury) (CMS/HCC)     Roberto Rice is a 36 y.o. 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 Infectious workup negative, awaiting biopsy results. Plan to treat with solumedrol and IVIG as creatinine remains largely unchanged.     Recommendations:  - Awaiting biopsy results  - Start treatment with IVIG and 500mg  solumedrol x 3 consecutive days (ordered). Ok to be completed at infusion center if plans for discharge today.   - Continue Fluid Resuscitation.  - Continue home immunosuppression.  - Recommend Counseling or Chaplain Consult to help patient cope with chronic illness.  - Please page [facility] Renal Transplant team with any questions or concerns.      Electronically signed by: Maryruth Eve, MD 07/12/2022 11:24 AM    I saw and evaluated the patient with the trainee. Discussed with the trainee and agree with the findings and plan as documented in their note.      Electronically signed by: Doreene Eland, MD 07/12/2022 12:14 PM

## 2022-07-13 NOTE — Progress Notes (Addendum)
Transplant Progress Note   LOS: 5 days   Transplant Date: 07/04/2016   Transplant Type: DDRT peds-en-bloc     Subjective:  38 Hour Events: No acute events overnight. Remained HDS and afebrile. 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 Cr stable. Patient endorses significant RLQ pain this AM, which began overnight. Uncomfortable and frustrated upon assessment. S/p day 1 of steroids and IVIG; will start Thymo today.      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    Hepatitis     Hep C antibody positive    Hyperparathyroidism (CMS/HCC)     Hypertension     Hypertension with goal to be determined      Scheduled Meds:acetaminophen, 650 mg, oral, Once  diphenhydrAMINE, 25 mg, oral, Once  heparin (porcine), 5,000 Units, subcutaneous, Q8H SCH  lidocaine, 2 patch, topical, Daily  methocarbamoL, 500 mg, oral, TID  methylPREDNISolone sodium succinate, 500 mg, intravenous, Q24H SCH  mycophenolate, 720 mg, oral, BID  NIFEdipine, 60 mg, oral, BID  [START ON 07/15/2022] predniSONE, 5 mg, oral, Daily  sodium bicarbonate, 1,300 mg, oral, TID  tacrolimus, 5 mg, oral, BID      Continuous Infusions:immune globulin pharmacy to adjust per protocol, 1 g/kg (Ideal)        PRN Meds:  acetaminophen    dextrose    dextrose    immune globulin pharmacy to adjust per protocol    melatonin    prochlorperazine    Objective:  Vital signs in last 24 hours:  Temp:  [98 F (36.7 C)-98.5 F (36.9 C)] 98 F (36.7 C)  Heart Rate:  [65-81] 65  Resp:  [18-19] 18  BP: (138-155)/(89-107) 138/89  Intake/Output last 3 shifts:  I/O last 3 completed shifts:  In: 1020 [P.O.:1020]  Out: -   Intake/Output this shift:  I/O this shift:  In: 250 [P.O.:250]  Out: -     Labs:  Recent Results (from the past 24 hour(s))   Basic Metabolic Panel    Collection Time: 07/13/22  5:34 AM   Result Value Ref Range    Sodium 132 (L) 136 - 145 mmol/L    Potassium 4.7 3.5 - 5.1 mmol/L     Chloride 103 98 - 107 mmol/L    CO2 19 (L) 21 - 31 mmol/L    Anion Gap 10 6 - 14 mmol/L    Glucose, Random 90 70 - 99 mg/dL    Blood Urea Nitrogen (BUN) 36 (H) 7 - 25 mg/dL    Creatinine 7.82 (H) 0.70 - 1.30 mg/dL    eGFR 24 (L) >95 AO/ZHY/8.65H8    Calcium 9.8 8.6 - 10.3 mg/dL    BUN/Creatinine Ratio 10.8 10.0 - 20.0   CBC without Differential    Collection Time: 07/13/22  5:34 AM   Result Value Ref Range    WBC 6.40 4.40 - 11.00 10*3/uL    RBC 3.95 (L) 4.50 - 5.90 10*6/uL    Hemoglobin 12.6 (L) 14.0 - 17.5 g/dL    Hematocrit 46.9 (L) 41.5 - 50.4 %    Mean Corpuscular Volume (MCV) 94.9 80.0 - 96.0 fL    Mean Corpuscular Hemoglobin (MCH) 32.0 27.5 - 33.2 pg    Mean Corpuscular Hemoglobin Conc (MCHC) 33.7 33.0 - 37.0 g/dL    Red Cell Distribution Width (RDW) 13.5 12.3 - 17.0 %    Platelet Count (PLT) 333 150 - 450  10*3/uL    Mean Platelet Volume (MPV) 7.6 6.8 - 10.2 fL     Other lab/culture results:    Diagnostic Studies:    US Kidney Transplant W Doppler Dual   Final Result by Johnston Ebbs, MD [phone])   US KIDNEY TRANSPLANT W DOPPLER DUAL, 07/08/2022 5:18 AM      INDICATION:  AKI of graft    COMPARISON: MRI 02/12/2021      TECHNIQUE: Multi-planar real-time ultrasonography of the transplanted    kidneys using grayscale imaging, supplemented by color and spectral    Doppler as needed.      FINDINGS:      ANATOMIC:   .  Transplanted kidneys are located in the right lower quadrant.   .  Lateral kidney Size = 11.6 x 7.2 x 6.5 cm.  Estimated volume = 286    cm^3.   .  Medial kidney Size = 11 x 5.2 x 5.3 cm.  Estimated volume = 158 cm^3.   .  The kidneys are normal in contour and echogenicity. No suspicious focal    mass is identified. No evidence of stone formation.   .  No hydronephrosis.    .  Peritransplant (deep) collection: None.   .  Subincisional (superficial) collection: None.   .  Ureter and bladder: Unremarkable.      VASCULAR kidney 1 (lateral):   Resistive indices as obtained from the arcuate arteries  are as follows:   0.67, 0.71 in the upper pole   0.64, 0.67 in the interpolar region   0.68, 0.67 in the lower pole   Resistive indices are within normal range.      TRANSPLANT RENAL ARTERY:   .  Peak systolic velocity estimates:   .  Proximal: 87 cm/sec   .  Mid: 65 cm/sec   .  Distal: 65 cm/sec   .  Waveform: Low resistance.      TRANSPLANT RENAL VEIN:   .  Patent.      VASCULAR kidney 2 (medial):   Resistive indices as obtained from the arcuate arteries are as follows:   0.6, 0.63 in the upper pole   0.65, 0.62 in the interpolar region   0.6, 0.62 in the lower pole   Resistive indices are within normal range.      TRANSPLANT RENAL ARTERY #1:   .  Peak systolic velocity estimates:   .  Proximal: 64 cm/sec   .  Mid: 69 cm/sec   .  Distal: 31 cm/sec   .  Waveform: Low resistance.      TRANSPLANT RENAL ARTERY #2:   .  Peak systolic velocity estimates:   .  Proximal: 131 cm/sec   .  Mid: 95 cm/sec   .  Distal: 159 cm/sec   .  Waveform: Low resistance.         TRANSPLANT RENAL VEIN:   .  Patent.         ILIAC ARTERY:    .  Proximal to anastomosis: 90 cm/sec   .  Distal to anastomosis: 63 cm/sec      ADDITIONAL COMMENTS: None.               1.  Status post en bloc renal transplant in the right lower quadrant.    Transplant vasculature is patent with normal range resistive indices.   2.  No hydronephrosis.   3.  No perinephric fluid collections.          Physical Exam:  Vitals:    07/12/22 2130 07/12/22 2235 07/12/22 2323 07/13/22 0732   BP: (!) 144/93 (!) 154/107 (!) 143/104 138/89   BP Location:  Left arm  Left arm   Patient Position:  Lying Lying Lying   Pulse: 73 79 73 65   Resp:  18     Temp:  98.2 F (36.8 C)  98 F (36.7 C)   TempSrc:  Oral     SpO2:  98%  99%   Weight:       Height:         Body mass index is 29.29 kg/m.  Wt Readings from Last 3 Encounters:   07/07/22 95.3 kg (210 lb)   12/19/21 101 kg (222 lb 10.6 oz)   09/05/21 110 kg (242 lb 8.1 oz)     Physical Exam:   General Appearance:  37 y.o. alert  and oriented x 3. Appears uncomfortable    Head:  Normocephalic, atraumatic   Eyes:  Conjunctivae clear. No scleral icterus.   Neck: Neck supple, trachea midline.   Throat:  Moist mucous membranes;  No thrush or ulcers.   Chest/Lungs:  Respirations unlabored. Oxygenating well on RA   Heart:  Regular rate and rhythm,   Abdomen:  Soft, non-distended, significantly tender to palpation in RLQ   Allograft:  RLQ renal allograft very tender    Extremities:  Full ROM. No cyanosis or edema   Skin:  Good skin turgor. No rashes or lesions.     Neurologic:  No focal deficits. No tremor.    Psych:   Judgement and memory intact.  Affect appropriate.       Assessment:  Principal Problem:    AKI (acute kidney injury) (CMS/HCC)     Roberto Rice is a 37 y.o. 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 Infectious workup negative, awaiting biopsy results. Plan to treat with solumedrol and IVIG as creatinine remains largely unchanged.     Recommendations:  - Biopsy notable for acute Banff 1B rejection  - Continue 500mg  solumedrol x 3 consecutive days (ordered)  - Start treatment with thymoglobulin x7 days  - After completion of solumedrol, begin 2 week prednisone taper starting at 20 mg daily and recycle ppx with Diflucan 50mg  daily x1 month and Valcyte 450mg  MWF x6 weeks  - Recommend adding prns for better pain control  - HLA DSA and PRA Testing ordered  - Continue home immunosuppression.  - Recommend Counseling or Chaplain Consult to help patient cope with chronic illness.  - Please page [facility] Transplant team with any questions or concerns.      Electronically signed by: Regenia Skeeter, MD 07/13/2022 10:51 AM      Dr. Pete Glatter addendum. I personally saw and examined the patient, reviewed clinical data, meds and labs, discussed the plan of care and  determined immunosuppression dosing.  I agree with the above note. A 14-point ROS was otherwise negative.  Unfortunately, the patient left AMA in the afternoon.

## 2022-07-13 NOTE — Nursing Note (Addendum)
1235h Patient came back from his walk and this RN was called at his bedside. He inform the writer of this note to inform his doctor that he will not stay to received the antithymocyte globulin infusion. He said he received this type of medication before and it almost killed him that's why he left the hospital. This RN clarified to him that the orders were thymoglobulin infusion with pre medications of benadryl, tylenol and  hydrocortisone IV and he said he is aware and  refuse to take, he  prefer to leave. I told him that his attending MD Dr. Julianne Handler will be informed regarding this matter.  [id number] Dr. Phineas Real said he will come to his room to talk to him.    Seen by Dr. Julianne Handler and informed that patient will be discharge AMA. IV cannula removed then patient walked out from his room. He will take his home medication in the [employer].

## 2022-07-13 NOTE — Progress Notes (Signed)
Hospitalist Daily Progress Note       Date: 07/13/2022         Room: R819/A  Hospital Day: 5  Attending Physician: Julianne Handler, MD    Subjective / Interval History / ROS   Patient complains of pain in his right lower abdomen at the site of his transplanted kidney.  He also has nausea.  He was refusing to take pain and nausea medicine for me earlier today.  His concern is that he has something going on in his stomach which is causing the rejection of the transplant and he is worried about taking the Solu-Medrol.  He is hoping that an EGD can be done to evaluate his stomach as he feels pretty sure that the rejection is having because of that.  I have discussed with him the plan of care which includes ongoing evaluation and care as per the transplant nephrology team.     Assessment and Plan   Roberto Rice is a 37 y.o. male with a PMH significant for end-stage renal disease status post deceased donor transplant in 14-Apr-2016, hypertension, HCV (RNA cleared 2015-04-15) hyperparathyroidism, anemia.  Patient was recently i evaluated for gastrointestinal bleeding at outside hospital and found to have angiodysplasia.  Patient came to the emergency department on 4/14 with complaint of continued abdominal pain, frequent loose bowel movements, abnormal urine appearance (foamy) and decreased urine output.  Patient reports compliance with immunosuppressive medications and was found to have AKI.    Principal Problem:    AKI (acute kidney injury) (CMS/HCC)  Active Problems:    Renal transplant recipient    Immunosuppression (HCC)    Acute renal transplant rejection    Anemia associated with acute blood loss    Lower GI bleed  Resolved Problems:    * No resolved hospital problems. *      1 Acute kidney injury due to Acute cell mediated rejection Banff 1B   Patient has a history of deceased donor transplant. baseline creatinine 1.5-1.7.    H/o GI losses prior to this admission, given IVF with  only slight improvement. Remains in 3's.   Renal ultrasound demonstrates transplant vasculature patent with normal range resistive indices.  No hydronephrosis nor perinephric fluid collections present.    CT scan of abdomen and pelvis demonstrate bladder wall thickening possibly consistent with cystitis, rectal wall thickening may represent proctitis.  No additional acute abnormalities appreciated.    Transplant team consulted   EBV, CMV, BK virus not detected.  Tx Kid Bx path: consistent with acute cell mediated rejection Banff 1B. C4d indeterminate, Suspicious for mild endarteritis.      -Given IVIG yesterday and current treatment with solumedrol 500 mg x 3 days.   - Tac levels 9.4, Contd Prograf 5 mg BID and myfortic. Trend Tacrolimus levels.   -Starting Thymoglobulin as per transplant team recommendations.  Increase prednisone to 20 mg p.o. daily and gradually taper to baseline dose of 5 mg p.o. daily in 2 weeks.  -Start prophylaxis with Diflucan 50 mg p.o. daily into 1 month and Valcyte 450 mg every Monday Wednesday Friday for 6 weeks after completion of Solu-Medrol as per transplant nephrology team.  - Trend daily BMP  -Oxycodone for pain control  - Recreational therapy for chronic illness coping requested.      Abdominal pain, diarrhea- resolved.   Recent GIB/anemia at OSH.   Reportedly had colonoscopy at OSH demonstrating angiodysplasia treated during procedure. Abdomen is benign. CT scan obtained on 4/15 demonstrating no clear  etiology for abdominal discomfort.  Abdominal pain has demonstrated improvement.  GI pathogen panel negative for infectious etiology, urine cx neg.  Currently no signs of bleeding.      - Trend CBC, BMP   -Abdominal pain related to transplant rejection.  As needed oxycodone.     Hypertension, essential:  He is on Nifedipine 60 mg BID, restarted home medications  - Monitor Bps and adjust regimen.      Headaches- resolved.   Monitor and treat symptomatically.      Electrolyte  disturbances mild electrolyte disturbances noted at time of admission including hyponatremia and hypomagnesemia.   NAGMA, bicarb 19.  Monitor and supplement electrolytes as needed.   - Increase Bicarb supplementation.         The primary contact is:   Extended Emergency Contact Information  Primary Emergency Contact: Hornbuckle,Regina   [place]  Mobile Phone: [phone]  Relation: Mother  Secondary Emergency Contact: Andy,Kendray   [place]  Home Phone: [phone]  Relation: Sister     Code Status: Full Code      DVT ppx: Subcutaneous Heparin   Current Diet:        Dietary Orders   (From admission, onward)                     Ordered     Adult Diet- Renal; CKD (pro/K/Phos/Na restricted)  Diet effective now        References:    Medical Nutrition Management (MNM) for Registered Dietitian   Question Answer Comment   Diet type: Renal    Renal Restriction: CKD (pro/K/Phos/Na restricted)    Medical Nutrition Management By RD Yes, Medical Nutrition Management By RD        07/11/22 1030                     Discharge Planning   The current disposition plan is discharge to Home in 3-5 days.  Barriers to discharge: Improvement in renal functions and treatment for transplant rejection  Discharge follow up needed: Nephrology        Objective     Temp:  [98 F (36.7 C)-98.5 F (36.9 C)] 98 F (36.7 C)  Heart Rate:  [65-81] 65  Resp:  [18-19] 18  BP: (138-155)/(89-107) 138/89     Intake/Output Summary (Last 24 hours) at 07/13/2022 1119  Last data filed at 07/13/2022 0800  Gross per 24 hour   Intake 730 ml   Output --   Net 730 ml          Physical Examination: General appearance - alert, well appearing, and in discomfort due to pain  Mental status - alert, oriented to person, place, and time  Eyes - pupils equal and reactive, extraocular eye movements intact  Chest - clear to auscultation, no wheezes, rales or rhonchi, symmetric air entry  Heart - normal rate, regular rhythm, normal S1, S2,  no murmurs, rubs, clicks or gallops  Abdomen -soft with bowel sounds present.  Tenderness in the right lower quadrant.  There is guarding.  No rebound tenderness.      Patient Lines/Drains/Airways Status       Active Peripherally inserted central catheter / Central venous catheter / Peripheral intravenous line / Drain / Airway / Intraosseous line / Epidural catheter / Arterial line / Line / Nasogastric/Orogastric Tube / Hemodialysis catheter / Umbilical venous catheter / Urethral Catheter / Intrauterine Pressure Catheter / Implantable Port / NHSN  Urethral Catheter / NHSN Umbilical Catheter / Intentionally Retained Foreign Objects / AV Fistula / Peripheral Nerve Catheter       Name Placement date Placement time Site Days    Peripheral IV 07/10/22 22 G Left;Posterior Forearm 07/10/22  2230  Forearm  2                      Labs and Results   I have reviewed the following labs and studies  Lab Results   Component Value Date    WBC 6.40 07/13/2022    HGB 12.6 (L) 07/13/2022    HCT 37.5 (L) 07/13/2022    MCV 94.9 07/13/2022    PLT 333 07/13/2022     Lab Results   Component Value Date    GLUCOSE 90 07/13/2022    CALCIUM 9.8 07/13/2022    NA 132 (L) 07/13/2022    K 4.7 07/13/2022    CO2 19 (L) 07/13/2022    CL 103 07/13/2022    BUN 36 (H) 07/13/2022    CREATININE 3.32 (H) 07/13/2022     Lab Results   Component Value Date    ALT 11 07/08/2022    AST 14 07/08/2022    BILITOT 0.8 07/08/2022     Lab Results   Component Value Date    INR 1.0 07/11/2022    PROTIME 10.8 07/11/2022       Micro:  Results for orders placed or performed during the hospital encounter of 07/08/22 (from the past 168 hour(s))   Urine Culture    Specimen: Clean Catch; Urine   Result Value Ref Range    Urine Culture No Growth    Gastrointestinal Pathogen Profile, Stool, NAAT    Specimen: Stool, Per Rectum   Result Value Ref Range    Campylobacter PCR Not Detected Not Detected    Plesiomonas shigelloides PCR Not Detected Not Detected    Salmonella PCR Not  Detected Not Detected    Vibrio PCR Not Detected Not Detected    Yersinia enterocolitica PCR Not Detected Not Detected    Enteroaggregative E coli PCR Not Detected Not Detected    Enteropathogenic E coli PCR Not Detected Not Detected    Enterotoxigenic E coli PCR Not Detected Not Detected    Shiga-Toxin-Producing E coli PCR Not Detected Not Detected    Shigella/Enteroinvasive E coli PCR Not Detected Not Detected    Cryptosporidium PCR Not Detected Not Detected    Cyclospora cayetanensis PCR Not Detected Not Detected    Entamoeba histolytica PCR Not Detected Not Detected    Giardia lamblia PCR Not Detected Not Detected    Adenovirus F 40/41 PCR Not Detected Not Detected    Astrovirus PCR Not Detected Not Detected    Norovirus GI/GII PCR Not Detected Not Detected    Rotavirus A PCR Not Detected Not Detected    Sapovirus PCR Not Detected Not Detected              Medications   Scheduled Medications:  acetaminophen, 650 mg, oral, Once  diphenhydrAMINE, 25 mg, oral, Once  heparin (porcine), 5,000 Units, subcutaneous, Q8H SCH  lidocaine, 2 patch, topical, Daily  methocarbamoL, 500 mg, oral, TID  methylPREDNISolone sodium succinate, 500 mg, intravenous, Q24H SCH  mycophenolate, 720 mg, oral, BID  NIFEdipine, 60 mg, oral, BID  [START ON 07/15/2022] predniSONE, 5 mg, oral, Daily  sodium bicarbonate, 1,300 mg, oral, TID  tacrolimus, 5 mg, oral, BID      Prn Medications:  acetaminophen    dextrose    dextrose    immune globulin pharmacy to adjust per protocol    melatonin    oxyCODONE    prochlorperazine  Continuous Medications:  .immune globulin pharmacy to adjust per protocol, 1 g/kg (Ideal)           Electronically signed by: Julianne Handler, MD 07/13/2022 11:19 AM

## 2022-07-13 NOTE — Discharge Summary (Signed)
Hospitalist  Discharge Summary     Name: Roberto Rice Age: 37 yrs   MRN: [id number] DOB: 11-Jun-1985   Admit Date: 07/08/2022 Admitting Physician: Achille Rich, MD   Discharge Date: 07/13/2022-patient signed out AGAINST MEDICAL ADVICE Discharge Physician: Julianne Handler       Admission Diagnosis:     AKI (acute kidney injury) (CMS/HCC) [N17.9]     Discharge Diagnoses:     Principal Problem:    AKI (acute kidney injury) (CMS/HCC)  Active Problems:    Renal transplant recipient    Immunosuppression (HCC)    Acute renal transplant rejection    Anemia associated with acute blood loss  Resolved Problems:    Lower GI bleed      TO DO List at Follow-up for PCP/Specialist:     Patient was asked to follow-up with his nephrologist as soon as possible for repeat testing of labs.    Pending Labs       Order Current Status    HLA PRE-TRANSPLANT ANTIBODY SCREEN (IN-HOUSE) In process    HLA Post Transplant Donor Specific Antibody Testing (IN-HOUSE) In process    Tissue Exam Preliminary result              Hospital Course:     For full details, please see H&P, progress notes, consult notes and ancillary notes.   37 y.o. male with a PMH significant for end-stage renal disease status post deceased donor transplant in 04/16/16, hypertension, HCV (RNA cleared 17-Apr-2015) hyperparathyroidism, anemia. Patient was recently ievaluated for gastrointestinal bleeding at outside hospital and found to have angiodysplasia.  Patient came to the emergency department on 4/14 with complaint of continued abdominal pain, frequent loose bowel movements, abnormal urine appearance (foamy) and decreased urine output.  Patient reports compliance with immunosuppressive medications and was found to have AKI  Baseline creatinine 1.5-1.7. He was started on IVFs initially but minimal improvement in serum creatinine. Renal Transplant team consulted and bx performed showing Acute cell mediated rejection Banff 1B.  Started on IV Steroids, IVIG per Tx team  recs and they managed immunosuppression.     1 Acute kidney injury due to Acute cell mediated rejection Banff 1B   Patient has a history of deceased donor transplant. baseline creatinine 1.5-1.7.    H/o GI losses prior to this admission, given IVF with only slight improvement. Remains in 3's.   Renal ultrasound demonstrates transplant vasculature patent with normal range resistive indices.  No hydronephrosis nor perinephric fluid collections present.    CT scan of abdomen and pelvis demonstrate bladder wall thickening possibly consistent with cystitis, rectal wall thickening may represent proctitis.  No additional acute abnormalities appreciated.    Transplant team consulted   EBV, CMV, BK virus not detected.  Tx Kid Bx path: consistent with acute cell mediated rejection Banff 1B. C4d indeterminate, Suspicious for mild endarteritis.      -Given IVIG yesterday and current treatment with solumedrol 500 mg x 3 days.   - Tac levels 9.4, Contd Prograf 5 mg BID and myfortic. Trend Tacrolimus levels.   -Plan was to start Thymoglobulin as per transplant team recommendations.  Increase prednisone to 20 mg p.o. daily and gradually taper to baseline dose of 5 mg p.o. daily in 2 weeks.  -Start prophylaxis with Diflucan 50 mg p.o. daily into 1 month and Valcyte 450 mg every Monday Wednesday Friday for 6 weeks after completion of Solu-Medrol as per transplant nephrology team.  - Trend daily BMP  -Oxycodone for pain control  -  Recreational therapy for chronic illness coping requested.     Patient refused Thymoglobulin and signed out AGAINST MEDICAL ADVICE.     Abdominal pain, diarrhea- resolved.   Recent GIB/anemia at OSH.   Reportedly had colonoscopy at OSH demonstrating angiodysplasia treated during procedure. Abdomen is benign. CT scan obtained on 4/15 demonstrating no clear etiology for abdominal discomfort.  Abdominal pain has demonstrated improvement.  GI pathogen panel negative for infectious etiology, urine cx  neg.  Currently no signs of bleeding.      - Trend CBC, BMP   -Abdominal pain related to transplant rejection.  As needed oxycodone during hospital stay was given.     Hypertension, essential:  Patient will be on home blood pressure medications.     Headaches- resolved.   Monitor and treat symptomatically.      Electrolyte disturbances mild electrolyte disturbances noted at time of admission including hyponatremia and hypomagnesemia.   NAGMA, bicarb 19.  Monitor and supplement electrolytes as needed.   - Increase Bicarb supplementation during hospital stay.       The patient's chronic medical conditions were treated accordingly per the patient's home medication regimen except as noted in the plan above and in the medication list below.      Discharge Condition:     Disposition: Patient discharged to Left Against Medical Advice in serious condition.    Diet at discharge:      Activity at Discharge: Ambulate ad lib              Physical Exam at Discharge     BP 138/89 (BP Location: Left arm, Patient Position: Lying)   Pulse 65   Temp 98 F (36.7 C)   Resp 18   Ht 1.803 m (5' 11)   Wt 95.3 kg (210 lb)   SpO2 99%   BMI 29.29 kg/m   Please refer to progress note dated 07/13/2022 for physical examination      Discharge Medications:           Medication List        ASK your doctor about these medications      cholecalciferol 2,000 unit Cap capsule  Commonly known as: VITAMIN D3  Take 1 capsule by mouth daily.     isosorbide mononitrate 30 mg 24 hr tablet  Commonly known as: IMDUR  TAKE 1 TABLET BY MOUTH DAILY.     Myfortic 180 mg Tbec DR tablet  Generic drug: mycophenolate  Take 4 tablets (720 mg total) by mouth 2 times daily.     NIFEdipine 60 mg 24 hr tablet  Commonly known as: PROCARDIA XL  TAKE 1 TABLET BY MOUTH 2 TIMES DAILY.     predniSONE 5 mg tablet  Commonly known as: DELTASONE  TAKE 1 TABLET BY MOUTH DAILY.     Prograf 1 mg capsule  Generic drug: tacrolimus  Take 5 capsules by mouth 2 times daily.  (Take  5 capsules (5 mg total) by mouth 2 times daily.)     sulfamethoxazole-trimethoprim 400-80 mg per tablet  Commonly known as: BACTRIM  TAKE 1 TABLET BY MOUTH 3 TIMES A WEEK.              Significant Diagnostic Tests:     LABS:   Lab Results   Component Value Date    WBC 6.40 07/13/2022    HGB 12.6 (L) 07/13/2022    HCT 37.5 (L) 07/13/2022    PLT 333 07/13/2022    CHOL 178 02/14/2022  TRIG 151 (H) 02/14/2022    HDL 45 (L) 02/14/2022    LDLDIRECT 115 (H) 02/14/2022    ALT 11 07/08/2022    AST 14 07/08/2022    NA 132 (L) 07/13/2022    K 4.7 07/13/2022    CL 103 07/13/2022    CREATININE 3.32 (H) 07/13/2022    BUN 36 (H) 07/13/2022    CO2 19 (L) 07/13/2022    INR 1.0 07/11/2022    HGBA1C 5.2 02/14/2022    HGBA1C  02/14/2022      Comment:      Presence of heterozygote variants in patients' samples do not interfere with accurate measurements of HbA1c using Trinity affinity boronate chromatography, when no other clinical conditions affecting quality/quantity of HbA and/or quantity of hemoglobin, overall, as well as quality/quantity of RBCs are concurrently present.     Presence of homozygote variants and/or other medical conditions affecting the quality/quantity of HbA (e.g. alpha- and beta-thalassemia) and/or quantity of hemoglobin, overall, as well as quality/quantity of RBCs (e.g. anemia of any cause) trigger inaccurate evaluations of the glycated HbA1c with any analytical method, including Trinity affinity boronate chromatography.  In these circumstances, fructosamine testing for these patients is recommended.    Hb F present at concentrations of 11% or above can interfere with accurate measurements of HbA1c.  In these circumstances, fructosamine testing for these patients is recommended.       IMAGING:   US Kidney Transplant W Doppler Dual   Final Result by Johnston Ebbs, MD [phone])   US KIDNEY TRANSPLANT W DOPPLER DUAL, 07/08/2022 5:18 AM      INDICATION:  AKI of graft    COMPARISON: MRI 02/12/2021       TECHNIQUE: Multi-planar real-time ultrasonography of the transplanted    kidneys using grayscale imaging, supplemented by color and spectral    Doppler as needed.      FINDINGS:      ANATOMIC:   .  Transplanted kidneys are located in the right lower quadrant.   .  Lateral kidney Size = 11.6 x 7.2 x 6.5 cm.  Estimated volume = 286    cm^3.   .  Medial kidney Size = 11 x 5.2 x 5.3 cm.  Estimated volume = 158 cm^3.   .  The kidneys are normal in contour and echogenicity. No suspicious focal    mass is identified. No evidence of stone formation.   .  No hydronephrosis.    .  Peritransplant (deep) collection: None.   .  Subincisional (superficial) collection: None.   .  Ureter and bladder: Unremarkable.      VASCULAR kidney 1 (lateral):   Resistive indices as obtained from the arcuate arteries are as follows:   0.67, 0.71 in the upper pole   0.64, 0.67 in the interpolar region   0.68, 0.67 in the lower pole   Resistive indices are within normal range.      TRANSPLANT RENAL ARTERY:   .  Peak systolic velocity estimates:   .  Proximal: 87 cm/sec   .  Mid: 65 cm/sec   .  Distal: 65 cm/sec   .  Waveform: Low resistance.      TRANSPLANT RENAL VEIN:   .  Patent.      VASCULAR kidney 2 (medial):   Resistive indices as obtained from the arcuate arteries are as follows:   0.6, 0.63 in the upper pole   0.65, 0.62 in the interpolar region   0.6, 0.62 in the lower  pole   Resistive indices are within normal range.      TRANSPLANT RENAL ARTERY #1:   .  Peak systolic velocity estimates:   .  Proximal: 64 cm/sec   .  Mid: 69 cm/sec   .  Distal: 31 cm/sec   .  Waveform: Low resistance.      TRANSPLANT RENAL ARTERY #2:   .  Peak systolic velocity estimates:   .  Proximal: 131 cm/sec   .  Mid: 95 cm/sec   .  Distal: 159 cm/sec   .  Waveform: Low resistance.         TRANSPLANT RENAL VEIN:   .  Patent.         ILIAC ARTERY:    .  Proximal to anastomosis: 90 cm/sec   .  Distal to anastomosis: 63 cm/sec      ADDITIONAL COMMENTS: None.                1.  Status post en bloc renal transplant in the right lower quadrant.    Transplant vasculature is patent with normal range resistive indices.   2.  No hydronephrosis.   3.  No perinephric fluid collections.        CULTURES:   Results for orders placed or performed during the hospital encounter of 07/08/22 (from the past 168 hour(s))   Urine Culture    Specimen: Clean Catch; Urine   Result Value Ref Range    Urine Culture No Growth    Gastrointestinal Pathogen Profile, Stool, NAAT    Specimen: Stool, Per Rectum   Result Value Ref Range    Campylobacter PCR Not Detected Not Detected    Plesiomonas shigelloides PCR Not Detected Not Detected    Salmonella PCR Not Detected Not Detected    Vibrio PCR Not Detected Not Detected    Yersinia enterocolitica PCR Not Detected Not Detected    Enteroaggregative E coli PCR Not Detected Not Detected    Enteropathogenic E coli PCR Not Detected Not Detected    Enterotoxigenic E coli PCR Not Detected Not Detected    Shiga-Toxin-Producing E coli PCR Not Detected Not Detected    Shigella/Enteroinvasive E coli PCR Not Detected Not Detected    Cryptosporidium PCR Not Detected Not Detected    Cyclospora cayetanensis PCR Not Detected Not Detected    Entamoeba histolytica PCR Not Detected Not Detected    Giardia lamblia PCR Not Detected Not Detected    Adenovirus F 40/41 PCR Not Detected Not Detected    Astrovirus PCR Not Detected Not Detected    Norovirus GI/GII PCR Not Detected Not Detected    Rotavirus A PCR Not Detected Not Detected    Sapovirus PCR Not Detected Not Detected     PATHOLOGY:   OTHER:     Surgeries/Procedures:         Other procedures performed:     Consults:     IP CONSULT TO NEPHROLOGY      Follow-up Appointments:      No future appointments.            Electronically signed by: Julianne Handler, MD 07/14/2022 3:14 PM
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
## 2022-08-01 NOTE — H&P (Signed)
-------------------------------------------------------------------------------  Attestation signed by Stevphen Rochester, MD at 08/08/2022  7:02 PM  I independently saw and evaluated the patient on 08/01/22  I reviewed the clinical data in EHR and discussed the assessment/Plan with the resident and agree with plan of care as documented with editions as below if needed/pertinent.   Will need transplant and nephrology consult in am for further recs on his Kid tx, management. No acute thymo planned per Tx team today   Contd home IS  Infectious diarrhea work up.   Old records reviewed.   Amritpal Pannu MD  Hospitalist attending.      -------------------------------------------------------------------------------    General Medicine Nephrology Ward History and Physical    Subjective:  Chief Complaint: Nausea, vomiting and diarrhea  History obtained from: Patient and EMR   History of Present Illness:  Roberto Rice is a 37 y.o. male with  DDKT in 2018, HCV (RNA cleared in 2017), hyperparathyroidism, ESRD secondary to hypertension (not currently on HD), oliguric, h/o cardiomyopathy and recent admission from 4/14 - 4/19 and subsequent elopement who represents to the emergency department at the recommendation of his transplant providers with an acute on chronic history of nausea, vomiting, diarrhea and abdominal pain.     Patient was recently hospitalized on 4/14 through 4/19 due to a similar chief complaint of abdominal pain in addition to generalized constitutional symptoms. The patient received IV steroids, IVIG and continued immunosuppression per transplant team. Patient was discharged with recommendations to continue thymoglobulin infusions at the request of the transplant team.     The patient was most recently seen in nephrology clinic on 5/1 where discussion was held to initiate thymoglobulin. However, patient respectfully declined treatment due to prior adverse reactions he experienced with the medication  including hair loss and generalized malaise.    The patient contacted his transplant team with ongoing nausea, vomiting, reduced urine output, abdominal pain and diarrhea for which his providers recommended he present to the [facility] facility for thymoglobulin treatment.     At bedside in the ED the patient continues to endorse the symptoms described above. Reports having faithful adherence to his immunosuppressive regimen. He states he is amenable to initiating thymoglobulin therapy after extensive discussion with family and his providers. ROS notable for extensive fevers resulting in generalized body sweats.       ED Course (reviewed and summarized):     Vitals:Temperature 98.2 F, pulse 96, respiratory rate 16, blood pressure 146/96, oxygen saturation 100%   Labs: Hemoglobin 13.6, WBC 5, Creatinine 4.11.   Radiology: None  Interventions: 500cc LR bolus    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    Hepatitis     Hep C antibody positive    Hyperparathyroidism (CMS/HCC)     Hypertension     Hypertension with goal to be determined      Social History     Socioeconomic History    Marital status: Single     Spouse name: Not on file    Number of children: Not on file    Years of education: Not on file    Highest education level: Not on file   Occupational History    Not on file   Tobacco Use    Smoking status: Former     Current packs/day: 0.00     Types: Cigarettes     Quit date: 03/26/2009  Years since quitting: 13.3    Smokeless tobacco: Never   Substance and Sexual Activity    Alcohol use: No    Drug use: No    Sexual activity: Not on file   Other Topics Concern    Not on file   Social History Narrative    Disability. engaged     Social Determinants of Health     Food Insecurity: Not on file   Transportation Needs: Not on file   Safety: Not on file   Living Situation: Not on file     Family History   Problem Relation Name Age  of Onset    Cervical cancer Mother      Ovarian cancer Maternal Grandmother      Breast cancer Maternal Aunt      Prostate cancer Maternal Uncle      Anesthesia problems Neg Hx                 OBJECTIVE:  Physical Exam:  Vital Signs:  Vitals:    08/01/22 1627   BP:    Pulse: 84   Resp: 18   Temp:    SpO2: 100%     Physical exam:  Cardiovascular: S1 and S2 distinct no rubs murmurs or gallops   Pulmonary: CTAB with reduced breath sounds to the bases   Skin: No signs of peripheral edema to the upper and lower extremities. Prior AVF to the right forearm.   General: Pleasant, cooperative and well nourished   Abdomen: Normoactive bowel sounds, non tender to palpation throughout all 9 quadrants       ASSESSMENT/PLAN:  Roberto Rice is a 37 y.o. male with  DDKT in 2018, HCV (RNA cleared in 2017), hyperparathyroidism, ESRD secondary to hypertension (not currently on HD), oliguric, h/o cardiomyopathy and recent admission from 4/14 - 4/19 and subsequent elopement who represents to the emergency department at the recommendation of his transplant providers with an acute on chronic history of nausea, vomiting, diarrhea and abdominal pain concerning for acute cell mediated transplant rejection.     #AKI   #Acute cell medicated rejection   #Diarrhea  #Nausea  Patient has a history of DDKT in 2018. Baseline Cr prior to hospitalization in April 2024 was 1.5-1.7.    Cr on discharge during April 2024 hospitalization was 3.32. On presentation to the [facility] during this hospital course was notable for 4.11.   US renal biopsy on 4/16 notable for acute cell mediated rejection Banff 1B. C4d intermediate   --  PLAN:  - Engage with transplant team in AM for recs considering initiating thymoglobulin and prophylaxis therapy  - Continue home immunosuppressive therapy:   -  prednisone 5mg  daily  - prograf 6mg  daily and 5mg  nightly   - myfortic 720mg  BID   - daily AM prograf levels   - Follow up GIP, C diff   - Follow up EBV and CMV  -  Nephrology consult in the AM     Chronic Medical Problems:  Hypertension: Continue nifedipine 60mg  daily     Hospital Checklist  #DVT PPX: Heparin SubQ   #FEN: No diet orders on file  #Discharge Planning:  #Ethics: Prior        Electronically signed by Massie Bougie, MD 08/01/2022 4:59 PM
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
## 2022-08-07 NOTE — Discharge Summary (Signed)
-------------------------------------------------------------------------------  Attestation signed by Janace Hoard, MD at 08/07/2022  7:04 PM  I saw and evaluated the patient. Discussed with resident and agree with resident's findings and plan as documented in the resident's note.  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 Briefly, Roberto Rice is a 37 y.o. male with  DDKT in 2018, HCV (RNA cleared in 2017), hyperparathyroidism, ESRD secondary to hypertension (not currently on HD), oliguric, h/o cardiomyopathy and recent admission from 4/14 - 4/19 with subsequent elopement who represents to the emergency department at the recommendation of his transplant providers with an acute on chronic history of nausea, vomiting, diarrhea and abdominal pain concerning for acute cell mediated transplant rejection. Found to have a superimposed pre-renal AKI on a pre-existing transplant sequelae likely precipitated by norovirus + GIP.      The patient's hospital course is described below in problem-based fashion.    #AKI on CKDIV in renal transplant kidney    #Acute cell medicated rejection   #Norovirus   Patient has a history of DDKT in 2018. Baseline Cr prior to hospitalization  in April 2024 was 1.5-1.7. Cr on discharge during April 2024 hospitalization was 3.32. On presentation to the [facility] during this hospital course was notable for 4.11.   US renal biopsy on 4/16 notable for acute cell mediated rejection Banff 1B. C4d intermediate. Presenting with diarrhea with C diff assay notable for colonization, GIP positive for norovirus. EBV and CMV were negative. During hospital course creatinine improved to 3.03 and making adequate urine after receiving IV fluids and improvement in GI losses. Overall patient's AKI secondary to volume depletion with ongoing diarrhea and emesis with underlying transplant rejection occurring.  Norovirus was treated Alinia for 5 total days for Norovirus; this will not be continued outpatient due to prohibitive cost. Patiennt received Decadron 100mg  IV for 3 doses (5/12-5/14) and IVIG 10 mg on 5/9 and then 40 mg on 5/13; another dose of 40 mg IVIG was offered on 5/14 but declined by the patient.     He will discharge on the following immunosuppression regimen following recommendations from Transplant:  - myfortic 360 mg bid (half dose)  - Prednisone 20 mg daily  - Tacrolimus 5 and 6 mg in am and pm, respectively       Chronic Medical Problems:  Hypertension: Continue nifedipine 60mg  daily      Physical Exam: 08/07/22  Physical exam:  Cardiovascular: S1 and S2 distinct no rubs murmurs or gallops   Pulmonary: CTAB with reduced breath sounds to the bases   Skin: No signs of peripheral edema to the upper and lower extremities. Prior AVF to the right forearm.   General: Pleasant, cooperative and well nourished   Abdomen: Normoactive bowel sounds,  non tender to palpation throughout all 9 quadrants       Discharge Follow-up Action Items:  Follow up with Tansplant within 1 week  Immunosuppression as above  Follow up with nephrology      Patient's Ordered Code Status: Full Code    Consults:  None    CBC:   Results from last 7 days   Lab Units 08/06/22  1141 08/05/22  1317  08/04/22  0514 08/03/22  0614   WHITE BLOOD CELL COUNT 10*3/uL 12.60* 3.90* 4.20* 3.80*   HEMOGLOBIN g/dL 96.0* 45.4* 09.8* 11.9*   HEMATOCRIT % 36.8* 37.7* 35.3* 35.2*   PLATELET COUNT 10*3/uL 313 312 290 295     CBD:   Results from last 7 days   Lab Units 08/06/22  1141 08/05/22  1317 08/04/22  0514 08/03/22  0614 08/02/22  0533 08/01/22  1531   WHITE BLOOD CELL COUNT 10*3/uL 12.60* 3.90* 4.20*   < > 3.90*  4.20* 5.00   RED BLOOD CELL COUNT 10*6/uL 3.92* 3.98* 3.74*   < > 3.70*  3.67* 4.37*   HEMOGLOBIN g/dL 14.7* 82.9* 56.2*   < > 12.0*  11.8* 13.6*   HEMATOCRIT % 36.8* 37.7* 35.3*   < > 34.5*  34.7* 40.5*   MEAN CORPUSCULAR VOLUME fL 93.8 94.7 94.4   < > 93.3  94.5 92.7   MEAN CORPUSCULAR HEMOGLOBIN pg 31.1 32.2 31.6   < > 32.5  32.1 31.3   MEAN CORPUSCULAR HEMOGLOBIN CONC g/dL 13.0 86.5 78.4   < > 69.6  34.0 33.7   RED CELL DISTRIBUTION WIDTH % 13.9 13.8 13.6   < > 13.6  13.9 14.2   MEAN PLATELET VOLUME fL 7.9 7.6 8.0   < > 7.8  7.8 8.0   PLATELET COUNT 10*3/uL 313 312 290   < > 307  307 372   LYMPHOCYTES RELATIVE PERCENT %  --   --   --   --  37 29   NEUTROPHILS RELATIVE PERCENT %  --   --   --   --  42 57   MONOCYTES RELATIVE PERCENT %  --   --   --   --  15 12   EOSINOPHILS RELATIVE PERCENT %  --   --   --   --  4 2   BASOPHILS RELATIVE PERCENT %  --   --   --   --  1 1   NRBC ABS 10*3/uL  --   --   --   --  0.00 0.00   NEUTROPHILS ABSOLUTE COUNT 10*3/uL  --   --   --   --  1.70* 2.80   LYMPHOCYTES ABSOLUTE COUNT 10*3/uL  --   --   --   --  1.50 1.50   MONOCYTES ABSOLUTE COUNT 10*3/uL  --   --   --   --  0.60 0.60   EOSINOPHILS ABSOLUTE COUNT 10*3/uL  --   --   --   --  0.20 0.10   BASOPHILS ABSOLUTE COUNT 10*3/uL  --   --   --   --  0.10 0.00    < > = values in this interval not displayed.     CMP:   Results from last 7 days   Lab Units 08/07/22  0621 08/06/22  1141 08/05/22  1317 08/04/22  0514 08/03/22  0614 08/02/22  0533 08/01/22  1531   SODIUM mmol/L 133* 131* 135* 137 136 136 133*  POTASSIUM mmol/L 4.6 4.2 4.1 3.8 3.6 3.8 3.8   CHLORIDE mmol/L 104 100 104 105 104 106 102   CO2 mmol/L 20* 22 22 24 21 21  19*   BUN mg/dL 48* 36* 28* 36* 35* 37* 39*   CREATININE mg/dL 0.98* 1.19* 1.47* 8.29* 3.67* 3.64* 4.11*   CALCIUM mg/dL 9.5 9.7 9.5 9.3 9.5 9.3 9.9   MAGNESIUM mg/dL  --   --  2.1 1.5* 1.5* 1.6*  --    PHOSPHORUS mg/dL  --   --  3.0 3.5 3.6 3.4  --    BILIRUBIN TOTAL mg/dL  --   --   --   --   --   --  1.4*   AST U/L  --   --   --   --   --   --  13   ALT U/L  --   --   --   --   --   --  11   TOTAL PROTEIN g/dL  --   --   --   --   --   --  8.5   ALBUMIN g/dL  --   --   --   --   --   --  4.9   ANION GAP mmol/L 9 9 9 8 11 9 12      Coags:      BNP:     Troponin:       No orders to display           Disposition: Against Medical Advice     Patient Instructions:      Discharge Medications        Modified Medications        Sig Disp Refill Start End   cholecalciferol 2,000 unit Cap capsule  Commonly known as: VITAMIN D3  What changed: when to take this   Take 1 capsule by mouth daily.   30 capsule   5       isosorbide mononitrate 30 mg 24 hr tablet  Commonly known as: IMDUR  What changed:   how much to take  how to take this  when to take this  additional instructions   TAKE 1 TABLET BY MOUTH DAILY.   90 tablet   1       Myfortic 180 mg Tbec DR tablet  Generic drug: mycophenolate  What changed:   how much to take  additional instructions   Take 2 tablets by mouth 2 times a day. Until transplant followup  (Take 2 tablets (360 mg total) by mouth 2 (two) times a day. Until transplant followup)   360 tablet   0       predniSONE 5 mg tablet  Commonly known as: DELTASONE  What changed:   how much to take  how to take this  when to take this   Take 4 tablets (20 mg total) by mouth daily.   360 tablet   0       Prograf 1 mg capsule  Generic drug: tacrolimus  What changed:   medication strength  how much to take  how to take this  when to take this  additional instructions  Another medication with the  same name was removed. Continue taking this medication, and follow the directions you see here.   Take five 1mg  capsule (5 mg total) in the morning and six 1mg  capsule (6 mg total) evening.   220 capsule   0  Medications To Continue        Sig Disp Refill Start End   NIFEdipine 60 mg 24 hr tablet  Commonly known as: PROCARDIA XL   TAKE 1 TABLET BY MOUTH 2 TIMES DAILY.   90 tablet   1       sulfamethoxazole-trimethoprim 400-80 mg per tablet  Commonly known as: BACTRIM   TAKE 1 TABLET BY MOUTH 3 TIMES A WEEK.   48 tablet   1                    Follow-up   Future Appointments   Date Time Provider Department Center   08/15/2022 10:00 AM [facility] TRANSPLANT MASTER SCHEDULE [facility] TRA ABD [facility] [place]   11/20/2022  3:00 PM Karin Lieu, MD [facility] DIALACC None         Electronically signed by: Lavada Mesi, MD 08/07/2022
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
## 2022-08-15 NOTE — Telephone Encounter (Signed)
Formatting of this note might be different from the original.  Patient called advised has been in hospital last month requesting labs be reviewed prior to appointment on 08-16-22 at 10:40 am  Lutricia Feil, MA    Electronically signed by Lutricia Feil, MA at 08/15/2022  4:41 PM EDT

## 2022-08-16 NOTE — Telephone Encounter (Signed)
Formatting of this note might be different from the original.  Notify the patient that his labs and urine studies have been reviewed.     His creatinine continues to improve and is down to 2.4 mg/dl.     Magnesium is low at 1.6. Recommend starting daily low dose magnesium oxide 400mg  daily. Rx sent.     PTH is elevated, likely due to his chronic kidney disease. Vitamin D level is stable.     He has no significant proteinuria.     Thanks,   Delfina Redwood APRN-CNP    Patient was contacted per provider request.  Name/DOB was verified.  I reviewed recommendations/orders per Delfina Redwood APRN.  The patient request lab reuslts be mailed to patient's home. Voiced understanding and had no further questions or concerns.  Patient advised to contact the office with any questions or concerns they may have prior to next appointment.    [name], LPN    Electronically signed by [name], LPN at 09/81/1914 10:25 AM EDT

## 2022-08-16 NOTE — Progress Notes (Signed)
Formatting of this note is different from the original.    [facility] Nephrology [facility] Hypertension  Established Patient Evaluation    Date of Visit: 08/16/2022  Patient Name: Roberto Rice  MRN: [id number]  PCP: No primary care provider on file.    No chief complaint on file.    Subjective:     Pt is a 37 y.o. male with a history of CKD stage 3a s/p DDRT at [facility] in 2018 whom was previously ESRD on HD for 8 years due to HTN that presents today for a follow-up visit. Baseline creatinine is 1.4-1.5 mg/dl.      Of note, patient was recently admitted to [facility] from 5/1 to 08/07/2022 and was noted to be in acute renal transplant rejection. He had AKI as well as acute GI symptoms including N/V/D and was positive for norovirus. He received IV steroids and IVIG while inpatient. He is supposed to follow up for re-admission for possible Thymo on 08/22/2022. He presents today wishing to transfer care to [name]. He is having difficulty maintaining the travel to keep up with his appointments and also wants to be closer to home for all of his transplant needs.     Home BP readings range: States cuff stopped working, but states usually is within normal range. Okay in office today.     NSAIDS use No, IV contrast exposure No.    Patient denies SOB, orthopnea, PND, or lower extremity edema.   Patient denies nausea, vomiting or diarrhea.   Patient also denies voiding symptoms including urgency, frequency, and dysuria.    Past Medical History:   Diagnosis Date    Blood transfusion, without reported diagnosis     Hemodialysis     m-w-f  in [place]    Hyperlipidemia     Hypertension     Kidney disease approx 4 weeks    esrd dialysis m- w- f [place]     Current Outpatient Medications   Medication Instructions    cholecalciferol (vitamin D3) (VITAMIN D3) 2,000 Units, Oral, DAILY    isosorbide mononitrate (IMDUR) 30 mg, Oral, DAILY    Mycophenolate Sodium (MYFORTIC) 360 mg, Oral, TWICE A DAY    NIFEdipine (PROCARDIA) 60  mg, Oral, TWICE A DAY    predniSONE (DELTASONE) 20 mg, Oral, DAILY    sulfamethoxazole-trimethoprim (BACTRIM) 400-80 mg per tablet 1 Tablet, Oral, MONDAY, WEDNESDAY AND FRIDAY    tacrolimus (PROGRAF) 5 mg, Oral, DAILY WITH BREAKFAST    tacrolimus (PROGRAF) 6 mg, Oral, AT BEDTIME     Allergies   Allergen Reactions    Nsaids (Non-Steroidal Anti-Inflammatory Drug) Other (See Comments)     CANNOT TAKE DUE TO KIDNEY FAILURE   CANNOT TAKE DUE TO KIDNEY FAILURE       Social History:  reports that he has never smoked. He does not have any smokeless tobacco history on file. He reports current drug use. Drug: Marijuana. He reports that he does not drink alcohol.    Family History: No family history of ESRD nor renal cell carcinoma.     REVIEW OF SYSTEMS: All systems reviewed and negative except as in HPI above.    Objective:     Vitals:    08/16/22 1015   BP: 121/89   BP Location: Left Upper Extremity   Patient Position: Sitting   Pulse: 77   Resp: 16   Weight: 92.1 kg (203 lb)   Height: 5' 11 (180.3 cm)     Wt Readings  from Last 3 Encounters:   08/16/22 92.1 kg (203 lb)   07/03/22 95.6 kg (210 lb 12.2 oz)   06/13/22 98 kg (216 lb)     Physical Examination:   General: Alert and oriented x 4. Appears non-toxic.  CV: Regular rate. Normal S1/S2. No rubs or murmur. No peripheral edema.  LUNGS: Respirations even and unlabored. Clear to auscultation bilaterally.     Labs:   Recent Labs:   Lab Results   Component Value Date    BUN 18 07/03/2022    CREATININE 2.7 (H) 07/03/2022    MAGNESIUM 1.7 06/05/2022    POTASSIUM 3.8 07/03/2022    SODIUM 139 07/03/2022    CHLORIDE 109 07/03/2022    CO2 18 (L) 07/03/2022    CALCIUM 9.1 07/03/2022    ALBUMIN 3.8 07/03/2022    GLUCOSE 72 07/03/2022    ESTIMATEDGFR 27 07/03/2022    WBC 4.8 07/03/2022    HGB 12.4 (L) 07/03/2022    HCT 37.2 07/03/2022    MCV 95.6 07/03/2022    PLATELETCNT 314 07/03/2022    SATURATION 12 (L) 06/30/2022    FERRITIN 685.8 (H) 06/30/2022    VITAMINB12 311 06/30/2022     PROTCREATRAT 0.2 (H) 06/30/2022    PTHINTACT 89.4 (H) 05/31/2021    PHOSPHORUS 4.1 07/02/2022     Assessment/Plan:   AKI/Stage 3a CKD. Patient recently admitted as above. All labs and imaging from [facility] reviewed. Most recent DC summary from [facility] also reviewed. Most recent creatinine was down to 3.0 mg/dl which is near his current baseline. Repeat RFP today.   Acute transplant rejection in DDRT (done in 2018) at [facility] whom was previously ESRD on HD for 8 years due to HTN. Continue immunosuppressants including: prednisone 20mg  daily, Myfortic 360mg  BID, and Prograf 5mg  in AM and 6mg  at night. Patient is established at [facility], however now lives local to [place], [place] and wants to transfer care to [name]. He is now in acute rejection. He is having difficulty maintaining the travel to keep up with this issue. Urgent referral to [name] sent. Requesting records from [facility] to be sent with his referral packet as well. Patient has been instructed to contact his transplant coordinator to facilitate transferring his care to [name] from [facility]. Also, emphasized the importance of the patient keeping his appointment at [facility] next week if he has not been able to establish care at [name] yet as we do not want him to lose his allograft. In addition, if repeat renal function is worse today, will refer patient to the ED for likely transfer to transplant center [facility] or [name]).   Renal Hypertension. At goal : Discussed with patient importance of low salt diet, compliance with medications. Continue current anti-HTN with the following changes: none.  Renal osteodystrophy. Calcium, phosphorus at goal. Check vitamin D and PTH today. Continue vitamin D supplementation.  Anemia of CKD. Hgb is at goal.    Acidosis: Serum Bicarbonate level below goal. Patient was previously prescribed sodium bicarbonate tablets but does not like using them so he drinks baking soda water instead.   Medications. Please  dose all medications for a CrCl <26 ml/min.  Disposition. Return in about 4 weeks (around 09/13/2022) for Dr. Astrid Drafts.    Per patient request, here is a list of his physicians at [facility]:  Dr Pamala Hurry did transplant in 2018.   Dr Santiago Glad is his nephrologist.   Dr Cassandria Santee is transplant nephrologist that  he saw during recent admission.   [employer] - seen during admission.  [place] - seen during admission.     Electronically signed:   Delfina Redwood, APRN-CNP    [facility]-Nephrology & Hypertension    1. CKD (chronic kidney disease) stage 4, GFR 15-29 ml/min (CMS/HCC)  Vitamin D 25 [place], Total S    Renal Function Panel    CBC w/Differential    Magnesium    PTH O/P, Intact    Protein/Creatinine Ratio Urine    Microalbumin, Random Urine    Protein/Creatinine Ratio Urine    Renal Function Panel    AMB REF TO TRANSPLANT CLINIC     2. Acute rejection of kidney transplant  AMB REF TO TRANSPLANT CLINIC       Orders Placed This Encounter    Vitamin D 25 [place], Total S    Renal Function Panel    CBC w/Differential    Magnesium    PTH O/P, Intact    Protein/Creatinine Ratio Urine    Microalbumin, Random Urine    Protein/Creatinine Ratio Urine    Renal Function Panel    AMB REF TO TRANSPLANT CLINIC     Patient Instructions   Thank you for seeing me today in Nephrology clinic.  Things we discussed today:    Your Kidney Function:  Your most recent creatinine level (a protein we measure in your blood to help determine your renal function) was 3.0 mg/dL, which means that you have about 26% kidney function.     Your current kidney function would classify you as Stage 4 Chronic Kidney Disease (CKD). A person usually doesn't require dialysis until he or she is down to around 10-15%.     Information/Instructions from your kidney care visit today:     Do not use NSAIDS for pain. NSAIDS include Mobic, Motrin, Aleve, Advil, Toradol, Diclogesic, Celebrex, ketorolac, ibuprofen, meloxicam, naproxen, diclofenac sodium.  Only use Tylenol for pain.    Changes done to your current medications include: none.     Check your blood pressure at home 3 days a week. Check it in the morning when you wake up, and before you go to bed. Bring the readings with you to you next appointment, or add them to your flowsheet in myChart for me to review. Please call the office if your blood pressure is higher than 140/90.    Avoid intravenous contrast exposure as it can cause kidney failure. However, if you are in need of contrast with a left heart catheterization and/or a CT scan with intravenous dye on an elective basis, then I request that either you or the ordering physician contact me for recommendations prior to the procedure (however, if this is an emergency, then please proceed and do not delay care).     You will hear back from our office within 48-72 hrs after your labs result if anything comes back abnormal.    Please get your labs drawn about 2-3 days prior to your next visit.    Return to clinic in 1 month.     [If you have a question/concern, or an urgent situation arises in which you need a sooner appointment, then please call the office for assistance. The [facility]-Nephrology [facility] Hypertension phone number is [phone].]     Signed:   Delfina Redwood, APRN  [facility]-Nephrology [facility] Hypertension   Electronically signed by Delfina Redwood, APRN at 08/16/2022 11:36 AM EDT

## 2022-08-22 NOTE — Progress Notes (Signed)
TXP  Prograf/Cyclosporin    5/6   time of a last dose before lab draw  8pm    Intake under/ over 64 oz daily: yes  UOP: good    Denies UTI symptoms: no  Changes in meds : no    Concerns today : disability information, Domingo Dimes, CMA

## 2022-08-24 NOTE — Telephone Encounter (Signed)
Formatting of this note might be different from the original.  Notes reviewed.     Patient does not have a follow up appointment and needs to be scheduled with Dr. Astrid Drafts within the next 2-4 weeks.     Thanks,   Delfina Redwood APRN-CNP  Electronically signed by Delfina Redwood, APRN at 08/24/2022  1:14 PM EDT

## 2022-08-24 NOTE — Telephone Encounter (Signed)
Formatting of this note might be different from the original.  Last office visit from [facility] scanned into media for review.   [name], LPN    Electronically signed by [name], LPN at 16/12/9602 12:59 PM EDT

## 2022-09-03 NOTE — Telephone Encounter (Signed)
Formatting of this note might be different from the original.  I discussed with Dr. Astrid Drafts. You can scheduled him with Dr. Astrid Drafts after she is back from her vacation. She does not want to be double booked.     I have ordered a CMV PCR per request of [facility] transplant team and repeat BMP to check his current kidney function. Will follow up on the results of this with him once they are back.     If any of these results require more urgent attention, then he can be seen by me in clinic.     Thanks,   Delfina Redwood APRN-CNP    It is ok to schedule first available with her? Would be September 12th. Spoke with patient's wife, two patient identifiers verified. Aware of lab orders placed.  [name], LPN    Electronically signed by [name], LPN at 14/78/2956  4:55 PM EDT

## 2022-09-03 NOTE — Telephone Encounter (Signed)
Formatting of this note might be different from the original.  Noted no follow up scheduledat this time    Telephone encounter 08/24/22  Notes reviewed.     Patient does not have a follow up appointment and needs to be scheduled with Dr. Astrid Drafts within the next 2-4 weeks.     Thanks,   Delfina Redwood APRN-CNP    Would it be ok to double book and schedule patient on 10/04/22? Nothing available prior.  [name], LPN    Electronically signed by [name], LPN at 14/78/2956  1:30 PM EDT

## 2022-09-04 NOTE — Telephone Encounter (Signed)
Formatting of this note might be different from the original.  I discussed appointment timing with Dr. Astrid Drafts. She would like him to see me in clinic and we will communicate closely with [facility] as needed. Schedule him with me sometime in late June/early July depending on my availability.     Thanks,   Delfina Redwood APRN-CNP  Electronically signed by Delfina Redwood, APRN at 09/04/2022 11:34 AM EDT

## 2022-09-05 NOTE — Telephone Encounter (Signed)
Formatting of this note might be different from the original.  NO ANSWER, WILL CALL BACK   Electronically signed by Jim Desanctis at 09/05/2022  2:20 PM EDT

## 2022-09-06 NOTE — Telephone Encounter (Signed)
Formatting of this note might be different from the original.  Notify the patient that his labs have been reviewed. CMV is still in process.     Creatinine is 2.7 mg/dl which is about 01% kidney function and stable.     Potassium is a little low at 3.5. Recommend taking a one time dose of PO Kcl and repeat labs on Monday. Rx sent and lab order placed.     Please schedule him with Dr. Astrid Drafts early August, and let him know to keep any upcoming appointments with [facility].     Please ask if he has heard anything from [name] about transferring his care from [facility] [name].     Thanks,   Delfina Redwood APRN-CNP      Electronically signed by Delfina Redwood, APRN at 09/06/2022  3:07 PM EDT

## 2022-09-06 NOTE — Telephone Encounter (Signed)
Formatting of this note might be different from the original.  Called and LVM for patient to call office regarding physicians note.  [name], LPN    Notify the patient that his labs have been reviewed. CMV is still in process.     Creatinine is 2.7 mg/dl which is about 47% kidney function and stable.     Potassium is a little low at 3.5. Recommend taking a one time dose of PO Kcl and repeat labs on Monday. Rx sent and lab order placed.     Please schedule him with Dr. Astrid Drafts early August, and let him know to keep any upcoming appointments with [facility].     Please ask if he has heard anything from [name] about transferring his care from [facility] to [name].     Thanks,   Delfina Redwood APRN-CNP      Electronically signed by [name], LPN at 82/95/6213  4:03 PM EDT

## 2022-09-07 NOTE — Telephone Encounter (Signed)
Formatting of this note might be different from the original.  Called and LVM for patient to call office regarding physicians note.  [name], LPN    Two notes combined    Note 1- from 09/07/22  Notify the patient that his CMV PCR is negative. Please fax this result to his transplant team at [facility].     Note 2- from 09/06/22 Creatinine is 2.7 mg/dl which is about 32% kidney function and stable.     Potassium is a little low at 3.5. Recommend taking a one time dose of PO Kcl and repeat labs on Monday. Rx sent and lab order placed.     Patient scheduled with Dr. Astrid Drafts August 6th at 11:10 AM, let him know to keep any upcoming appointments with [facility].     Please ask if he has heard anything from [name] about transferring his care from [facility] to [name].     Thanks,   Delfina Redwood APRN-CNP    Electronically signed by [name], LPN at 20/25/4270 12:17 PM EDT

## 2022-09-10 NOTE — Telephone Encounter (Signed)
Formatting of this note might be different from the original.  Called pt, no answer unable to leave vm.    Roberto Rice    Electronically signed by Floreen Comber, MA at 09/10/2022 12:46 PM EDT

## 2022-09-10 NOTE — Telephone Encounter (Signed)
Formatting of this note might be different from the original.  Labs reviewed. Renal function stable. Potassium still low at 3.5. Did he take the potassium supplements prescribed, or is he picking them up today?    Thanks,   Delfina Redwood APRN-CNP  Electronically signed by Delfina Redwood, APRN at 09/10/2022 12:23 PM EDT

## 2022-09-12 NOTE — Telephone Encounter (Signed)
Formatting of this note might be different from the original.  Tried calling patient got vm , left message    Drue Novel, MA    Electronically signed by Drue Novel, MA at 09/12/2022  8:52 AM EDT

## 2022-09-12 NOTE — Telephone Encounter (Signed)
Formatting of this note might be different from the original.  Please contact [name] and see if they have received referral from [place] and/or [facility] on this patient and get an update as to where we are in the process of him being seen by them.    Records reviewed from last clinic visit. Please update his medication list as they increased his Myfortic and are tapering his prednisone.     Thanks,   Delfina Redwood APRN-CNP    Medication list updated in epic. Attempt to contact [name] transplant at [phone], they are closed for the holiday.  [name], LPN    Electronically signed by [name], LPN at 09/81/1914 11:14 AM EDT

## 2022-09-12 NOTE — Telephone Encounter (Signed)
Formatting of this note might be different from the original.  Clinic notes from [facility] scanned into media for review.  [name], LPN    Electronically signed by [name], LPN at 62/13/0865 10:28 AM EDT

## 2022-09-12 NOTE — Telephone Encounter (Signed)
Formatting of this note might be different from the original.  Two notes combined    Note 1- from 09/07/22  Notify the patient that his CMV PCR is negative. Please fax this result to his transplant team at [name].     Note 2- from 09/06/22 Creatinine is 2.7 mg/dl which is about 16% kidney function and stable.     Potassium is a little low at 3.5. Recommend taking a one time dose of PO Kcl and repeat labs on Monday. Rx sent and lab order placed.     Patient scheduled with Dr. Astrid Drafts August 6th at 11:10 AM, let him know to keep any upcoming appointments with [name].     Please ask if he has heard anything from [name] about transferring his care from [name] to [name].     Thanks,   Delfina Redwood APRN-CNP    Patient was contacted today per provider request.  Name/DOB was verified.  I reviewed recommendations/orders per Delfina Redwood, APRN.  Noted patient had labs repeated on 09/10/22 and had not completed advisement on potassium above. I have informed him of this recommendation at this time. Patient states he has not heard anything about his transfer of care. The patient now has [name] and will soon not be able to return to [name] d/t cost. Please advise. The patient voiced understanding and had no further questions or concerns.  Patient advised to contact the office with any questions or concerns they may have prior to next appointment.    [name], LPN    Electronically signed by [name], LPN at 10/96/0454 10:16 AM EDT

## 2022-09-13 NOTE — Telephone Encounter (Signed)
Formatting of this note is different from the original.    Called to see if fax was received. Transmission log shows fax went through 09/13/22 at 11:22 A.Joellyn Haff, LPN    Please contact [name] and see if they have received referral from [place] and/or [facility] on this patient and get an update as to where we are in the process of him being seen by them.   Thanks,   Delfina Redwood APRN-CNP      Electronically signed by [name], LPN at 13/24/4010 12:55 PM EDT

## 2022-09-13 NOTE — Telephone Encounter (Signed)
Formatting of this note might be different from the original.  Please contact [name] and see if they have received referral from [place] and/or [facility] on this patient and get an update as to where we are in the process of him being seen by them.   Thanks,   Delfina Redwood APRN-CNP     I have contact Darian at [name] [facility] referrals. Two patient identifiers have been verified. States she has not received our fax. I have verified fax number [phone] as correct. I have verified transmission log from 08/17/22 at 9:35 A.M., showing that document had sent. Will follow up once new transmission log is received to verify if documents have been received.  [name], LPN    Electronically signed by [name], LPN at 09/81/1914 11:11 AM EDT

## 2022-09-14 NOTE — Telephone Encounter (Signed)
Formatting of this note might be different from the original.  Called to see if fax was received. Transmission log shows fax went through 09/13/22 at 11:22 A.Joellyn Haff, LPN        Please contact [name] and see if they have received referral from [place] and/or [facility] on this patient and get an update as to where we are in the process of him being seen by them.   Thanks,   Delfina Redwood APRN-CNP   Electronically signed by [name], LPN at 16/12/9602 11:53 AM EDT

## 2022-09-14 NOTE — Telephone Encounter (Signed)
Formatting of this note might be different from the original.  Two patient identifiers verified:  name and DOB  Provider:  Merton Border  Last appointment:  08/16/22  Next appointment and/or one scheduled today:   10/30/22   Pharmacy verified and attached correctly:  yes  Refill request for how many days:  90  Refill read back and/or verified per chart:  yes  [name], LPN    Electronically signed by [name], LPN at 81/19/1478  9:37 AM EDT

## 2022-09-17 NOTE — Telephone Encounter (Signed)
Formatting of this note might be different from the original.  Called to see if fax was received. Transmission log shows fax went through 09/13/22 at 11:22 A.Joellyn Haff, LPN        Please contact [name] and see if they have received referral from [place] and/or [facility] on this patient and get an update as to where we are in the process of him being seen by them.   Thanks,   Delfina Redwood APRN-CNP   Electronically signed by [name], LPN at 16/12/9602  2:47 PM EDT

## 2022-09-18 NOTE — Progress Notes (Signed)
Formatting of this note is different from the original.  Images from the original note were not included.  [facility] URGENT CARE    Subjective    Subjective:     Patient ID: Roberto Rice is an 37 y.o. male.    Chief Complaint:  Chief Complaint   Patient presents with    Other     Tore muscle on upper rt arm.      States lifting 22 year old child and felt pop in right upper arm with swelling and deformity since.    Other  This is a new problem. The current episode started yesterday. The problem occurs constantly. The problem has been unchanged. Associated symptoms include joint swelling. Pertinent negatives include no fever. He has tried nothing for the symptoms.     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/2011    ARTERIO-VENOUS FISTULA performed by Audelia Acton, MD at [facility] CVOR    HX BROKEN/FX BONES      lt ankle ten pins and a plate    HX COLONOSCOPY N/A 07/01/2022    COLONOSCOPY /C ARGON PLASMA COAGULATION performed by Princess Bruins, MD at [facility] MAIN OR    HX OTHER SURGICAL HISTORY      tunnel cath 06/2011 [facility]    HX ULTRASOUND, INTRAOPERATIVE Right 08/16/2011    ULTRASOUND, INTRAOPERATIVE performed by Audelia Acton, MD at [facility] CVOR    TUNNEL CATH PLACEMENT N/A 08/02/2011    TUNNEL CATH PLACEMENT performed by Audelia Acton, MD at [facility] VASCULAR LABS    VENOUS PTA Right 10/04/2011    VENOUS PTA performed by Audelia Acton, MD at [facility] VASCULAR LABS     Family History   Problem Relation Name Age of Onset    Hypertension Mother       Hypertension Father       Social History     Tobacco Use    Smoking status: Never   Vaping Use    Vaping Use: Never used   Substance Use Topics    Alcohol use: No    Drug use: Yes     Types: Marijuana     Allergies   Allergen Reactions    Nsaids (Non-Steroidal Anti-Inflammatory Drug) Other (See Comments)     CANNOT TAKE DUE TO KIDNEY FAILURE   CANNOT TAKE DUE TO KIDNEY FAILURE       Current Outpatient Medications on File Prior to Visit   Medication Sig Dispense Refill    mycophenolate sodium (MYFORTIC) 180 mg DR tablet Take 3 Tabs by mouth Twice a day for 90 days. 540 Tablet 0    tacrolimus (PROGRAF) 1 mg Take 6 mg by mouth At bedtime.      sulfamethoxazole-trimethoprim (BACTRIM) 400-80 mg per tablet Take 1 Tablet by mouth Monday, Wednesday and Friday.      cholecalciferol, vitamin D3, (VITAMIN D3) Tab tablet Take  2,000 Units by mouth Once Daily.      isosorbide mononitrate (IMDUR) 30 mg CR tablet Take 30 mg by mouth Once Daily.      magnesium oxide (MAG-OX) 400 mg tablet Take 1 Tablet by mouth Once Daily. 30 Tablet 3    NIFEdipine (PROCARDIA) 10 mg capsule Take 60 mg by mouth Twice a day.      tacrolimus (PROGRAF) 5 mg Take 5 mg by mouth once daily with breakfast.      predniSONE (DELTASONE) 10 mg tablet Take 5 mg by mouth Once Daily. Take 3 tabs daily for 14 days, then 2 tabs daily for 14 days, then 1 tab daily      [EXPIRED] potassium chloride SA (KLOR-CON M20) 20 mEq tablet Take 2 Tabs by mouth One time only for 1 dose. 2 Tablet 0     Review of Systems   Constitutional: Negative.  Negative for fever.   HENT: Negative.     Musculoskeletal:  Positive for joint swelling.   Neurological: Negative.    Psychiatric/Behavioral: Negative.     All other systems reviewed and are negative.        Objective   Objective:     BP 141/90   Pulse 55   Temp 98.7 F (37.1 C)   Resp 16   Ht 5' 11 (180.3 cm)   Wt 94.3 kg (208 lb)   SpO2 100%   BMI 29.01 kg/m     Physical Exam  Vitals and nursing note reviewed.    Constitutional:       General: He is not in acute distress.     Appearance: Normal appearance.   HENT:      Head: Normocephalic and atraumatic.   Pulmonary:      Effort: Pulmonary effort is normal. No respiratory distress.   Musculoskeletal:         General: Deformity and signs of injury present. Normal range of motion.        Arms:    Skin:     General: Skin is warm and dry.      Capillary Refill: Capillary refill takes less than 2 seconds.   Neurological:      General: No focal deficit present.      Mental Status: He is alert and oriented to person, place, and time.   Psychiatric:         Mood and Affect: Mood normal.         Behavior: Behavior normal.         Thought Content: Thought content normal.         Judgment: Judgment normal.     Procedures      Assessment:     1. Arm pain, right  cyclobenzaprine (FLEXERIL) 10 mg tablet    methylPREDNISolone (MEDROL, PAK,) 4 mg tablet           Plan:   Ace wrap, medication as directed    Advised to follow up with ortho (handout provided) this week for follow up and or further testing/ treatment    To ER if worse    Excuse from work x 3 days      Electronically signed by Sinclair Grooms, APRN at 09/18/2022 10:13 AM EDT

## 2022-09-18 NOTE — Progress Notes (Signed)
Formatting of this note is different from the original.  Subjective   Subjective:     Patient ID: Roberto Rice is an 37 y.o. male    Chief Complaint:   Chief Complaint   Patient presents with   ? Follow-up     Patient tore his bicep last night wrestling with his kids. He states he woke up this morning and his bicep was hanging and discolored/bruised. Pain 7/10.      Elbow Injury  This is a new problem. The current episode started yesterday. The problem has been rapidly worsening. Associated symptoms include arthralgias, joint swelling and myalgias. Pertinent negatives include no abdominal pain, chest pain, fatigue, fever, headaches, nausea, numbness, rash or vomiting. The symptoms are aggravated by bending and twisting. He has tried rest for the symptoms. The treatment provided no relief.     Past Medical History:   Diagnosis Date   ? Blood transfusion, without reported diagnosis    ? Hemodialysis     m-w-f  in [place]   ? Hyperlipidemia    ? Hypertension    ? Kidney disease approx 4 weeks    esrd dialysis m- w- f [place]     Current Outpatient Medications   Medication Sig Dispense Refill   ? mycophenolate sodium (MYFORTIC) 180 mg DR tablet Take 3 Tabs by mouth Twice a day for 90 days. 540 Tablet 0   ? cyclobenzaprine (FLEXERIL) 10 mg tablet Take 1 Tablet by mouth Three times a day as needed. 20 Tablet 0   ? methylPREDNISolone (MEDROL, PAK,) 4 mg tablet Take 1 Tablet by mouth As directed. Follow package directions. 21 Tablet 0   ? tacrolimus (PROGRAF) 1 mg Take 6 mg by mouth At bedtime.     ? sulfamethoxazole-trimethoprim (BACTRIM) 400-80 mg per tablet Take 1 Tablet by mouth Monday, Wednesday and Friday.     ? cholecalciferol, vitamin D3, (VITAMIN D3) Tab tablet Take 2,000 Units by mouth Once Daily.     ? isosorbide mononitrate (IMDUR) 30 mg CR tablet Take 30 mg by mouth Once Daily.     ? magnesium oxide (MAG-OX) 400 mg tablet Take 1 Tablet by mouth Once Daily. 30 Tablet 3   ? NIFEdipine (PROCARDIA) 10 mg  capsule Take 60 mg by mouth Twice a day.     ? tacrolimus (PROGRAF) 5 mg Take 5 mg by mouth once daily with breakfast.     ? predniSONE (DELTASONE) 10 mg tablet Take 5 mg by mouth Once Daily. Take 3 tabs daily for 14 days, then 2 tabs daily for 14 days, then 1 tab daily       No current facility-administered medications for this visit.     Allergies   Allergen Reactions   ? Nsaids (Non-Steroidal Anti-Inflammatory Drug) Other (See Comments)     CANNOT TAKE DUE TO KIDNEY FAILURE   CANNOT TAKE DUE TO KIDNEY FAILURE       Review of Systems   Constitutional:  Negative for activity change, appetite change, fatigue and fever.   HENT:  Negative for ear pain, nosebleeds, rhinorrhea, sinus pressure and tinnitus.    Eyes:  Negative for photophobia, pain and visual disturbance.   Respiratory:  Negative for shortness of breath and stridor.    Cardiovascular:  Negative for chest pain and palpitations.   Gastrointestinal:  Negative for abdominal distention, abdominal pain, diarrhea, nausea and vomiting.   Genitourinary:  Negative for difficulty urinating, dysuria, testicular pain and urgency.   Musculoskeletal:  Positive for  arthralgias, joint swelling and myalgias.   Skin:  Negative for color change, rash and wound.   Neurological:  Negative for dizziness, numbness and headaches.   Psychiatric/Behavioral:  Negative for agitation and behavioral problems. The patient is not nervous/anxious.      Social History     Socioeconomic History   ? Marital status: Single     Spouse name: Not on file   ? Number of children: Not on file   ? Years of education: Not on file   ? Highest education level: Not on file   Occupational History   ? Not on file   Tobacco Use   ? Smoking status: Never   ? Smokeless tobacco: Not on file   Vaping Use   ? Vaping Use: Never used   Substance and Sexual Activity   ? Alcohol use: No   ? Drug use: Yes     Types: Marijuana   ? Sexual activity: Not on file   Other Topics Concern   ? Not on file   Social History  Narrative   ? Not on file     Social Determinants of Health     Financial Resource Strain: Not on file   Food Insecurity: Not on file   Transportation Needs: Not on file   Physical Activity: Not on file   Stress: Not on file   Social Connections: Not on file   Intimate Partner Violence: Not on file   Housing Stability: Not on file     Past Surgical History:   Procedure Laterality Date   ? AV FISTULOGRAM N/A 10/04/2011    AV FISTULOGRAM performed by Audelia Acton, MD at [facility] VASCULAR LABS   ? EMBOLIZATION Right 10/04/2011    EMBOLIZATION performed by Audelia Acton, MD at [facility] VASCULAR LABS   ? 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 HX BROKEN/FX BONES      lt ankle ten pins and a plate   ? HX COLONOSCOPY N/A 07/01/2022    COLONOSCOPY /C ARGON PLASMA COAGULATION performed by Princess Bruins, MD at [facility] MAIN OR   ? HX OTHER SURGICAL HISTORY      tunnel cath 06/2011 [facility]   ? HX ULTRASOUND, INTRAOPERATIVE Right 08/16/2011    ULTRASOUND, INTRAOPERATIVE performed by Audelia Acton, MD at [facility] CVOR   ? TUNNEL CATH PLACEMENT N/A 08/02/2011    TUNNEL CATH PLACEMENT performed by Audelia Acton, MD at [facility] VASCULAR LABS   ? VENOUS PTA Right 10/04/2011    VENOUS PTA performed by Audelia Acton, MD at [facility] VASCULAR LABS     Family History   Problem Relation Name Age of Onset   ? Hypertension Mother     ? Hypertension Father           Objective   Objective:   Resp 18   Ht 5' 11 (180.3 cm)   Wt 94.3 kg (208 lb)   BMI 29.01 kg/m     Physical Exam  Vitals and nursing note reviewed.   Constitutional:       General: He is not in acute distress.     Appearance: He is well-developed. He is not diaphoretic.   Skin:     General: Skin is warm and dry.   Neurological:      Mental Status: He is alert and oriented to person, place, and time.      Cranial Nerves: No cranial nerve  deficit.      Coordination: Coordination normal.   Psychiatric:         Behavior: Behavior  normal.         Thought Content: Thought content normal.     Right Elbow Exam     Tenderness   The patient is experiencing no tenderness (distal biceps).     Range of Motion   Extension:  abnormal   Flexion:  abnormal   Pronation:  abnormal   Supination:  abnormal     Tests   Varus: negative  Valgus: negative  Tinel's sign (cubital tunnel): negative    Other   Erythema: absent  Scars: absent  Sensation: normal    Comments:  + hook  + Popeye  Ecchymosis and bruising     Left Elbow Exam   Left elbow exam is normal.    Tenderness   The patient is experiencing no tenderness.     Range of Motion   Extension:  normal   Flexion:  normal   Pronation:  normal   Supination:  normal     Muscle Strength   Pronation:  5/5/5   Supination:  5/5/5     Tests   Varus: negative  Valgus: negative  Tinel's sign (cubital tunnel): negative    Other   Erythema: absent  Sensation: normal        Assessment/ Plan:       ICD-10-CM ICD-9-CM    1. Arm injury, right, initial encounter  S49.91XA 959.8 XR Shoulder Right 2vw Or More      XR Elbow Right 3 VW      MRI Elbow WO Right     2. Rupture of right distal biceps tendon, initial encounter  S46.211A 840.8 MRI Elbow WO Right       ?    New patient/problem  Xrays obtained and interpreted today independently by myself, await radiology report.    Large distal biceps tear  DIscussed urgency  Discussed surgical probability  ICE  COmpression  Will obtain MRI stat to better assess  FU post MRI    Electronically signed by Dorothe Pea, MD at 09/18/2022 10:53 AM EDT

## 2022-09-18 NOTE — Telephone Encounter (Signed)
Formatting of this note might be different from the original.  Meredith Mody, Print production planner, states he has spoken with [name] and verified that Oretha Caprice has received referral for patient.  [name], LPN    Please contact [name] and see if they have received referral from [place] and/or [facility] on this patient and get an update as to where we are in the process of him being seen by them.   Thanks,   Delfina Redwood APRN-CNP   Electronically signed by [name], LPN at 44/05/4740  3:41 PM EDT

## 2022-09-19 NOTE — Telephone Encounter (Signed)
Formatting of this note might be different from the original.  I have called the patient's spouse, two patient identifiers verified. States they have not been contact by [name] at this time.  [name], LPN    Electronically signed by [name], LPN at 91/47/8295  2:57 PM EDT

## 2022-09-19 NOTE — Telephone Encounter (Signed)
Formatting of this note might be different from the original.  Spoke with Darian at [name] [facility]. States she sent a return fax that referral was received incomplete. Lowella Bandy states she will resend referral at this time. States once everything is received patient can be scheduled once everything is cleared financially through insurance.  [name], LPN    Electronically signed by [name], LPN at 16/12/9602  3:01 PM EDT

## 2022-09-20 NOTE — Progress Notes (Signed)
Formatting of this note is different from the original.  Subjective   Subjective:     Patient ID: Roberto Rice is an 37 y.o. male    Chief Complaint:   Chief Complaint   Patient presents with    Elbow Injury     Right bicep f/u after MRI- patient in sling & c/o a lot of pain.      HPI  Elbow Injury  Injury occurred on 09/17/22 after wrestling with his kids.  Pain is present over right bicep area and is rated as 8/10 today.  He has been icing, resting, wearing sling with ace wraps and taking tylenol prn.  He is here today to discuss results of MRI.     Past Medical History:   Diagnosis Date    Blood transfusion, without reported diagnosis     Hemodialysis     m-w-f  in [place]    Hyperlipidemia     Hypertension     Kidney disease approx 4 weeks    esrd dialysis m- w- f [place]     Current Outpatient Medications   Medication Sig Dispense Refill    mycophenolate sodium (MYFORTIC) 180 mg DR tablet Take 3 Tabs by mouth Twice a day for 90 days. 540 Tablet 0    cyclobenzaprine (FLEXERIL) 10 mg tablet Take 1 Tablet by mouth Three times a day as needed. 20 Tablet 0    methylPREDNISolone (MEDROL, PAK,) 4 mg tablet Take 1 Tablet by mouth As directed. Follow package directions. 21 Tablet 0    tacrolimus (PROGRAF) 1 mg Take 6 mg by mouth At bedtime.      sulfamethoxazole-trimethoprim (BACTRIM) 400-80 mg per tablet Take 1 Tablet by mouth Monday, Wednesday and Friday.      cholecalciferol, vitamin D3, (VITAMIN D3) Tab tablet Take 2,000 Units by mouth Once Daily.      isosorbide mononitrate (IMDUR) 30 mg CR tablet Take 30 mg by mouth Once Daily.      magnesium oxide (MAG-OX) 400 mg tablet Take 1 Tablet by mouth Once Daily. 30 Tablet 3    NIFEdipine (PROCARDIA) 10 mg capsule Take 60 mg by mouth Twice a day.      tacrolimus (PROGRAF) 5 mg Take 5 mg by mouth once daily with breakfast.      predniSONE (DELTASONE) 10 mg tablet Take 5 mg by mouth Once Daily. Take 3 tabs daily for 14 days, then 2 tabs daily for 14 days, then 1  tab daily       No current facility-administered medications for this visit.     Allergies   Allergen Reactions    Nsaids (Non-Steroidal Anti-Inflammatory Drug) Other (See Comments)     CANNOT TAKE DUE TO KIDNEY FAILURE   CANNOT TAKE DUE TO KIDNEY FAILURE       Review of Systems  Constitutional: Negative for activity change, appetite change, chills, fatigue and fever.   HENT: Negative for rhinorrhea, sore throat and trouble swallowing.    Eyes: Negative for photophobia and visual disturbance.   Respiratory: Negative for cough, chest tightness, shortness of breath and wheezing.    Cardiovascular: Negative for palpitations.   Gastrointestinal: Negative for abdominal distention, abdominal pain, nausea and vomiting.   Genitourinary: Negative for dysuria and hematuria.   Musculoskeletal: Negative for neck pain and neck stiffness.   Skin: Negative for color change and rash.   Neurological: Negative for dizziness, syncope, weakness, numbness and headaches.   Psychiatric/Behavioral: Negative for agitation, behavioral problems and sleep disturbance.  Social History     Socioeconomic History    Marital status: Single     Spouse name: Not on file    Number of children: Not on file    Years of education: Not on file    Highest education level: Not on file   Occupational History    Not on file   Tobacco Use    Smoking status: Never    Smokeless tobacco: Not on file   Vaping Use    Vaping Use: Never used   Substance and Sexual Activity    Alcohol use: No    Drug use: Yes     Types: Marijuana    Sexual activity: Not on file   Other Topics Concern    Not on file   Social History Narrative    Not on file     Social Determinants of Health     Financial Resource Strain: Not on file   Food Insecurity: Not on file   Transportation Needs: Not on file   Physical Activity: Not on file   Stress: Not on file   Social Connections: Not on file   Intimate Partner Violence: Not on file   Housing Stability: Not on file     Past Surgical  History:   Procedure Laterality Date    AV FISTULOGRAM N/A 10/04/2011    AV FISTULOGRAM performed by Audelia Acton, MD at 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    ARTERIO-VENOUS FISTULA performed by Audelia Acton, MD at [facility] CVOR    HX BROKEN/FX BONES      lt ankle ten pins and a plate    HX COLONOSCOPY N/A 07/01/2022    COLONOSCOPY /C ARGON PLASMA COAGULATION performed by Princess Bruins, MD at [facility] MAIN OR    HX OTHER SURGICAL HISTORY      tunnel cath 06/2011 [facility]    HX ULTRASOUND, INTRAOPERATIVE Right 08/16/2011    ULTRASOUND, INTRAOPERATIVE performed by Audelia Acton, MD at [facility] CVOR    TUNNEL CATH PLACEMENT N/A 08/02/2011    TUNNEL CATH PLACEMENT performed by Audelia Acton, MD at [facility] VASCULAR LABS    VENOUS PTA Right 10/04/2011    VENOUS PTA performed by Audelia Acton, MD at [facility] VASCULAR LABS     Family History   Problem Relation Name Age of Onset    Hypertension Mother      Hypertension Father           Objective   Objective:   Resp 16   Ht 5' 11 (180.3 cm)   Wt 94.3 kg (208 lb)   BMI 29.01 kg/m     Physical Exam  Nursing note reviewed. Exam conducted with a chaperone present.   Constitutional:       Appearance: Normal appearance.   Pulmonary:      Effort: Pulmonary effort is normal.   Musculoskeletal:         General: Tenderness, deformity (popeye) and signs of injury (09/17/22) present. No swelling.   Skin:     General: Skin is warm and dry.      Findings: Bruising present.   Neurological:      Mental Status: He is alert and oriented to person, place, and time.      Sensory: No sensory deficit.      Motor: No weakness.   Psychiatric:  Behavior: Behavior normal.         Thought Content: Thought content normal.     Right Elbow Exam     Tenderness   Right elbow tenderness location: proximal bicep tendon.     Range of Motion   Extension:  abnormal   Flexion:  abnormal    Pronation:  normal   Supination:  normal     Muscle Strength   Pronation:  5/5/5   Supination:  5/5/5     Tests   Varus: negative  Valgus: negative  Tinel's sign (cubital tunnel): negative    Other   Erythema: absent  Scars: absent  Sensation: normal  Pulse: present    Comments:  Positive Hook Test    Left Elbow Exam   Left elbow exam is normal.    Tenderness   The patient is experiencing no tenderness.     Range of Motion   Extension:  normal   Flexion:  normal   Pronation:  normal   Supination:  normal     Muscle Strength   Pronation:  5/5/5   Supination:  5/5/5     Tests   Varus: negative  Valgus: negative  Tinel's sign (cubital tunnel): negative    Other   Erythema: absent  Sensation: normal                                      Radiology    PATIENT NAME:  Roberto Rice, Roberto Rice                 MR#:  [id number]  PROCEDURE DATE:  09/18/2022                       ACCOUNT#:  [id number]  ACCESSION #:  [id number]                            ROOM#:  ORDERING PHYS:  Dorothe Pea, MD      EXAM: Right Elbow MRI    CLINICAL INDICATION:  Dx: Arm injury, right, initial encounter (S49.91XA (ICD-10-CM)); Rupture of  right distal biceps tendon, initial encounter    TECHNIQUE:  Axial T1, axial T2 FS, sagittal T2 FS, coronal proton density, coronal T2 FS,  coronal DESS.    COMPARISON:  Radiograph performed 09/18/2022    FINDINGS:  Bone and Bone Marrow: There is no fracture, AVN, or intraosseous lesion. The  marrow signal is within normal limits.  Cartilage: The cartilage is smoothly congruent throughout the joint space  without focal deficits or osteochondral defect (OCD).  Medial: The medial epicondyle is normal in signal with no evidence of flexor  origin inflammation or avulsion. The ulnar collateral ligament is intact with  no evidence of tear.  Lateral: The lateral epicondyle is normal in signal with no evidence of  extensor origin inflammation or avulsion. The lateral ulnar collateral and  radial collateral ligaments are normal  in signal with no evidence of tear.  Anterior: The biceps and brachialis tendons are normal in signal and insert  properly on the radial tubercle and ulna, respectively. There is no evidence  of distal tear.  Posterior: The triceps tendon inserts normally on the olecranon with no  evidence of distal tear. The ulnar neurovascular bundle is normal in signal  with no evidence of inflammatory change particularly in the region of  the  cubital tunnel.  Soft Tissues: There is no evidence of muscle strain. There is no evidence of  periarticular bursitis. There is no joint effusion. There is a prominent  superficial vein within the flexor aspect of the forearm and antecubital  fossa.    IMPRESSION:  No evidence of biceps tendon tear.                  THIS IS AN ELECTRONICALLY VERIFIED REPORT               09/18/2022 4:36 PM:  Steva Ready, MD          Assessment/ Plan:     1. Injury of tendon of biceps  Ambulatory referral to Physical Therapy       Est pt here for f/u visit and to review MRI results; see above.  Consulted with Dr. Deretha Emory and he examined patient as well.   Reassurance  Initiate PT at [facility] to include dry needling and scraping  Sling and ace wraps to elbow for comfort  He is limited to tylenol due to renal transplant and compound cream prescribed.   Continue ice, rest and elevation  He works at [employer] and will keep him off work for two weeks and may return to work as tolerated.   F/u 6 weeks to assess    Thank you for allowing me to participate in this patient's care.     Leigh Aurora, APRN  09/20/2022  9:15 AM      Electronically signed by Leigh Aurora, APRN at 09/20/2022 12:31 PM EDT

## 2022-09-24 NOTE — Telephone Encounter (Signed)
Formatting of this note might be different from the original.  Victorino Dike has returned my call, two patient identifiers verified. States she will send last clinic note and additional information regarding the patient's transplant procedure and hospitalization to [name] [facility].  [name], LPN    Electronically signed by [name], LPN at 16/12/9602  2:51 PM EDT

## 2022-09-24 NOTE — Telephone Encounter (Signed)
Spoke to [place] with [facility] in [place], (Telephone [phone]), reporting Pt has relocated to [facility], so documentation was faxed by nephrology office to [name] for acute rejection. However, referral was incomplete and the [name] [facility] is requesting transplant information to be faxed to:  [name]  Attn: Darian with referral  Fax number: [phone]  Electronically signed by: Elray Mcgregor, RN 09/24/2022 3:29 PM

## 2022-09-24 NOTE — Telephone Encounter (Signed)
Formatting of this note might be different from the original.  I have left a message with Victorino Dike at [facility] to see if they have sent over any information regarding patient's referral to [name] [facility].  [name], LPN    Electronically signed by [name], LPN at 16/12/9602 10:19 AM EDT

## 2022-09-26 NOTE — Telephone Encounter (Signed)
Formatting of this note might be different from the original.  I have left a detailed message to [place] at [name] transplant team requesting she call with an update regarding patient's urgent referral.  [name], LPN    Electronically signed by [name], LPN at 57/84/6962  9:39 AM EDT

## 2022-09-26 NOTE — Telephone Encounter (Signed)
Formatting of this note might be different from the original.  I have spoken with Synetta Fail at [name] transplant team. Two patient identifiers verified. States they have called and left messages several times to attempt to schedule patient without success. I have provided the spouses phone number to their facility at this time.  [name], LPN    Electronically signed by [name], LPN at 04/54/0981  2:38 PM EDT

## 2022-09-26 NOTE — Telephone Encounter (Signed)
Formatting of this note might be different from the original.  Spoke with Darian at [name], two patient identifiers verified. States the patient is a post transplant patient and I will need to contact the post transplant team at [phone].  [name], LPN    Electronically signed by [name], LPN at 47/82/9562  9:38 AM EDT

## 2022-09-28 NOTE — ED Notes (Signed)
Formatting of this note might be different from the original.  ED Hourly rounding completed at this time. Patient is awake. Patient reports pain 0/10. Currently, patient has visitors. Updated the patient on plan of care including pending ED results and disposition status - pending transfer to other facility .  Call light at bedside and patient demonstrated usage.   Electronically signed by Zenda Alpers, RN at 09/28/2022  5:53 AM EDT

## 2022-09-28 NOTE — ED Notes (Signed)
Formatting of this note might be different from the original.  ED Hourly rounding completed at this time. Patient is resting. Currently, patient has visitors. Updated the patient on plan of care including pending ED results and disposition status - pending transfer to other facility .  Call light at bedside and patient demonstrated usage.   Electronically signed by Zenda Alpers, RN at 09/28/2022  4:19 AM EDT

## 2022-09-28 NOTE — ED Notes (Signed)
Formatting of this note might be different from the original.  ED Hourly rounding completed at this time. Patient is awake. Patient reports pain 0/10. Currently, patient has visitors. Updated the patient on plan of care including pending ED results and disposition status - pending further results .  Call light at bedside and patient demonstrated usage.   Electronically signed by Zenda Alpers, RN at 09/28/2022  1:29 AM EDT

## 2022-09-28 NOTE — Discharge Summary (Signed)
-------------------------------------------------------------------------------  Attestation with edits by Lorenza Cambridge, MD at 09/29/2022  7:16 AM  I saw and evaluated the patient. I discussed the case with the resident/fellow and agree with the findings and plan as documented.      -------------------------------------------------------------------------------      Hospitalization  Admit Date/Time: 09/28/2022  8:08 AM  Admitting Attending: Lorenza Cambridge  Discharge Date: 09/28/2022  Discharge Attending Physician: Lorenza Cambridge, MD   PCP name and Address:  Dayton Bailiff, APRN [address] / [place] [address]  Referring provider name and address:   [address]  [place],  [place] [address]    Chief Concern, Brief History of Present Illness, and Hospital Course   Roberto Rice is a 37 y.o. male with past medical history of CKD s/p transplant in 2018 presenting with edema, vomiting, diarrhea, and decreased urine output. He has been experiencing vomiting and diarrhea for the past 4-5 days. He reports feeling diffusely swollen and complains of dyspnea that he associated with fluid overload. He has had prior episodes of vomiting and diarrhea, but has never experienced swelling like this. Of note, he is currently trying to transition transplant care from [facility] to [name].  He was hospitalized 07/08/22-07/13/22 due to abdominal pain and was found to have AKI. A biopsy demonstrated acute cell mediated rejection. Documentation mentioned leaving against recommendations, but patient reports being told to leave.    He was also hospitalized 08/01/22-08/07/22 due to diarrhea with C. Diff colonization, GIP+ for norovirus. He was found to have AKI from volume depletion with diarrhea and emesis as well as transplant rejection.    Nephrology was consulted following admission and provided recommendations for workup and treatment of patient's ongoing disease. Unfortunately, patient wanted to leave the hospital prior to receiving further  treatments. Nursing notified night team regarding patient directed discharge; however, patient left hospital prior to being seen by provider.    After speaking with next of kin, patient and spouse were discontent with the quality of care that they were receiving and left to visit another hospital to be seen.     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 He currently did not need urgent dialysis but was at high risk.     He voiced he was upset after the nephology team told him he would need dialysis and he felt that they treated him as if he had left [facility] against medical advice, when he said he did not.     Surgeries and Procedures            Medication List        .      cholecalciferol 50 MCG (2000 UT) capsule  Commonly known as: Vitamin D-3  Take 1 capsule (2,000 Units) by mouth 1 (one) time each day.     mycophenolate 180 MG EC tablet  Commonly known as: Myfortic  Take 3 tablets (540 mg) by mouth 2 (two) times a day.     NIFEdipine XL 60 MG 24 hr tablet  Commonly known as: Procardia XL  Take 1  tablet (60 mg) by mouth 2 (two) times a day. Do not crush, chew, or split.     predniSONE 10 MG tablet  Commonly known as: Deltasone  Take 1 tablet (10 mg) by mouth 1 (one) time each day.     sodium bicarbonate powder     sulfamethoxazole-trimethoprim 400-80 MG tablet  Commonly known as: Bactrim  Take 1 tablet by mouth 3 times a week. Take 1 tablet by mouth on Monday Wednesday Friday     * tacrolimus 1 MG capsule  Take 6 capsules (6 mg) by mouth 1 (one) time each day with breakfast.     * tacrolimus 5 MG capsule  Take  1 capsule (5 mg) by mouth 1 (one) time each day in the evening.          * * This list has 2 medication(s) that are the same as other medications prescribed for you. Read the directions carefully, and ask your doctor or other care provider to review them with you.                  Discharge Diagnosis  Medical Problems       Active and Resolved Hospital Problems          Hospital    * (Principal) Acute renal transplant rejection          Post Discharge Instructions  No medications were prescribed upon discharge per recs    Outpatient Follow-Up  No future appointments.    Test Results Pending At Discharge  Pending Labs       Order Current Status    BK Virus Quantitative DNA PCR In process    Cytomegalovirus (CMV) Quantitative PCR In process    Cytomegalovirus Antibody IgM In process    EPSTEIN-BARR VIRUS ANTIBODY TO EARLY D ANTIGEN (EA-D), IGG (SO) In process    Epstein Barr Virus (EBV) Quantitative PCR In process    Tacrolimus In process            Pertinent Physical Exam At Time of Discharge  Pt unable to be examined prior to patient directed discharge    Discharge Disposition/Condition  Disposition: Patient directed discharge  Condition: Stable (s/sx potential problems absent or manageable)    I spent < 30 minutes of patient care and instruction time in preparation for this discharge.      Ileene Patrick, PGY-1  *Some images could not be shown.

## 2022-09-28 NOTE — ED Notes (Signed)
Formatting of this note might be different from the original.  ED Hourly rounding completed at this time. Patient is resting. Currently, patient has visitors. Updated the patient on plan of care including pending ED results and disposition status - pending transfer to other facility .  Call light at bedside and patient demonstrated usage.   Electronically signed by Zenda Alpers, RN at 09/28/2022  3:44 AM EDT

## 2022-09-28 NOTE — H&P (Signed)
-------------------------------------------------------------------------------  Attestation signed by Lorenza Cambridge, MD at 09/28/2022  5:02 PM  I saw and evaluated the patient. I discussed the case with the medical student and resident/fellow and agree with the findings and plan as documented. I personally participated in the management of the patient.    Labs personally reviewed with WBG 7.5, Hgb 10.8, Plt 345, VBG 7.37/31. Na 139, K 3.4, Bicarb 19 with anion gap 18, BUN 63, Cr 12.09 from about 2.6 as recent baseline, phos 6.2, Mg 2.2, lactate 0.8, CRP 8.9. Urinalysis with 100 protein, no WBCs/RBCs.     Kidney ultrasound: Both right lower quadrant transplant kidneys are normal in appearance; normal renal artery resistive indices.     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 He does not appear hypovolemic on exam and BUN/Cr is well under 20 pointing away from pre-renal etiologies. No signs of obstruction on renal ultrasound. Tacrolimus toxicity a possibility. Infectious etiologies a possibility  - Infectious work-up - CMV/EBV/BK studies; plus GI and C. Diff PCR in the setting of diarrhea, given any active infection will impact treatment  - Renal transplant guiding mycophenolate, tacrolimus plus steroid dosing    - Based on infectious work-up, may need pulse dose steroids, etc for acute rejection in future  - No indications for acute dialysis currently  - For hyperphosphatemia, start sevalemar   - For metabolic acidosis, start bicarb 1300mg  BID  - If signs of pulmonary edema/hypoxia or low urine output per renal, diuretic challenge with high dose lV lasix.   - Low phos/low K diet   - Check iron studies to see if would benefit from  IV iron  -------------------------------------------------------------------------------    Consult to Hospital Medicine - [facility]  Consult performed by: Lorenza Cambridge, MD  Consult ordered by: Estil Daft, MD        Chief Concern & History Of Present Illness  Roberto Rice is a 37 y.o. male with past medical history of CKD s/p transplant in 2018 presenting with edema, vomiting, diarrhea, and decreased urine output. He has been experiencing vomiting and diarrhea for the past 4-5 days. He reports feeling diffusely swollen and complains of dyspnea that he associated with fluid overload. He has had prior episodes of vomiting and diarrhea, but has never experienced swelling like this. Of note, he is currently trying to transition transplant care from [facility] to [name] and has had little contact with a transplant team recently.   He was hospitalized 07/08/22-07/13/22 due to abdominal pain and was found to have AKI. A biopsy demonstrated acute cell mediated rejection. He reportedly left AMA, although patient denies.  He was also hospitalized 08/01/22-08/07/22 due to diarrhea with C. Diff colonization, GIP+ for norovirus. He was found to have AKI from volume depletion with diarrhea and emesis as well as transplant rejection.     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    Social History  -denies use of tobacco   -denies use of alcohol  -endorses regular use of marijuana for nausea    Occupational History  -works as a Estate agent  -also works at [employer] History  Relevant International Travel History:   * Travel Screening       Question Response    Have you  been in contact with someone who was sick? No / Unsure    Do you have any of the following new or worsening symptoms? None of these    Have you traveled internationally or domestically in the last month? No          Travel History   Travel since 08/29/22    No documented travel since 08/29/22           Immunizations  not reviewed    VACCINE/DOSE   Flu   Tetanus   Pneumovax   Shingles       Allergies  Nsaids due to CKD    Outpatient medications in system  Per outside hospital:  -mycophenolate sodium (MYFORTIC) 180 mg DR tablet Take 3 Tabs by mouth Twice a day for 90 days.   -cyclobenzaprine (FLEXERIL) 10 mg tablet Take 1 Tablet by mouth Three times a day as needed.   -methylPREDNISolone (MEDROL, PAK,) 4 mg tablet Take 1 Tablet by mouth As directed. Follow package directions.   -[EXPIRED] potassium chloride SA (KLOR-CON M20) 20 mEq tablet Take 2 Tabs by mouth One time only for 1 dose.   -Tacrolimus (PROGRAF) 1 mg Take 6 mg by mouth At bedtime.   -sulfamethoxazole-trimethoprim (BACTRIM) 400-80 mg per tablet Take 1 Tablet by mouth Monday, Wednesday and Friday.   -cholecalciferol, vitamin D3, (VITAMIN D3) Tab tablet Take 2,000 Units by mouth Once Daily.   -Isosorbide mononitrate (IMDUR) 30 mg CR tablet Take 30 mg by mouth Once Daily.   -magnesium oxide (MAG-OX) 400 mg tablet Take 1 Tablet by mouth Once Daily.   -NIFEdipine (PROCARDIA) 10 mg capsule Take 60 mg by mouth Twice a day.   -tacrolimus (PROGRAF) 5 mg Take 5 mg by mouth once daily with breakfast.   -predniSONE (DELTASONE) 10 mg tablet Take 5 mg by mouth Once Daily. Take 3 tabs daily for 14 days, then 2 tabs daily for 14 days, then 1 tab daily            Review of Systems   Constitutional:  Positive for fatigue and unexpected weight change. Negative for chills and fever.        Believes weight gain due to swelling. Reports normal weight is 205, today is 212  Also reports increased sleep   HENT:  Negative for congestion, hearing loss, rhinorrhea and sore throat.     Eyes:  Negative for visual disturbance.   Respiratory:  Positive for shortness of breath. Negative for cough.    Cardiovascular:  Positive for leg swelling. Negative for palpitations.   Gastrointestinal:  Positive for abdominal distention, abdominal pain, diarrhea, nausea and vomiting.   Endocrine: Positive for cold intolerance.   Genitourinary:  Positive for decreased urine volume.   Neurological:  Positive for dizziness, numbness and headaches.        Physical Exam  Constitutional:       Comments: Appears fatigued   HENT:      Head: Normocephalic.   Cardiovascular:      Rate and Rhythm: Normal  rate and regular rhythm.      Heart sounds: Normal heart sounds.   Pulmonary:      Breath sounds: No wheezing, rhonchi or rales.   Abdominal:      General: Bowel sounds are normal. There is no distension.      Tenderness: There is abdominal tenderness. There is no right CVA tenderness or left CVA tenderness.   Musculoskeletal:      Right lower leg: Edema present.      Left lower leg: Edema present.   Neurological:      Mental Status: He is alert.          Last Recorded Vitals  Blood pressure (!) 147/94, pulse 93, temperature 36.9 C (98.5 F), temperature source Oral, resp. rate 16, height 1.803 m (5' 11), weight 96.6 kg (212 lb 15.4 oz), SpO2 99%.    Relevant Results      Labs in last 18 hours  CBC   WBC  7.56  Hb  10.8 (L)  Plt  345     Hct  31.4 (L)    ANC 4.12    INR 1.1, PTT , Anti-Xa      BMP   Na  139  Cl  103  BUN  63 (H)    Glu  98   K  3.4 (L)  Co2  18 (L)   Cr  12.09 (H)     Ca 8.8 (L) iCa 4.4 (L)   Mg 2.2, Phos 6.2 (H)   Lactate      LFT   AST  19   AlkPhos  103  T Prot  7.7    ALK  17  Bili  0.6  Alb      D.Bili         Assessment/Plan   Truong Milham is a 37 year old male with PMH of CKD, s/p DDKT 07/04/16, anemia, pulmonary edema, CHF, cardiomyopathy, HTN, and Hep C presenting with generalized swelling, decreased urine output, vomiting, diarrhea, and intermittent dyspnea.     Differential:  AKI  superimposed on CKD due to transplant rejection  -patient has history of CKD, s/p DDKT 07/04/16  -recent diagnosis of AKI and transplant rejection  -Cr severely elevated at 12.09  -BUN elevated at 63  -UA shows protein 100  -HTN (142/96-157/100)  -decreased urine output, edema    2.Infection- CMV, BK, EBV  -can cause elevated Cr  -can cause diarrhea and abdominal pain  -elevated risk in transplant patients    3. Congestive heart failure  -signs of volume overload (edema, dyspnea, weight gain)  -elevated BNP  -hypertension  -less likely acute exacerbation due to normal pulmonary and cardiovascular exam    4. Gastroenteritis  -vomiting and diarrhea  -has 8 kids at home  -prior episodes       Plan:  -consult to transplant nephrology due to elevated Cr and BUN, decreased urine output, and suspicion of AKI with transplant rejection, appreciate recs   -CMV, EBV, BK PCR to evaluate for infectious causes  -C. Diff PCR/comp GI panel due to diarrhea  - zofran prn for nausea/vomiting  -order echocardiogram to assess heart function  -encourage PO intake of fluid  -start phosphate binder due to hyperphosphatemia  -give potassium due to hypokalemia  -sodium bicarb 1300 BID   -Restart home prednisone and tacrolimus, low dose mycophenolate   -order iron studies to assess anemia      Code Status  Full code    I saw and  evaluated the patient with the medical student. I discussed the case with the medical student and agree with the findings and plan as documented. I personally performed the Exam and Medical Decision Making.     Nils Flack, MD  PGY-3, Internal Medicine    *Some images could not be shown.

## 2022-09-28 NOTE — ED Triage Notes (Signed)
Pt states he has had swelling, vomiting and diarrhea x 4 days. Pt has history of bilateral kidney transplant in 2018.

## 2022-09-28 NOTE — ED Notes (Signed)
Formatting of this note might be different from the original.  Report given to [place], [name] @ [name] [facility]    Electronically signed by Zenda Alpers, RN at 09/28/2022  2:39 AM EDT

## 2022-09-28 NOTE — ED Notes (Signed)
Formatting of this note might be different from the original.  [facility] at bedside to transport pt. Report given to [facility].  Electronically signed by Zenda Alpers, RN at 09/28/2022  5:45 AM EDT
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
## 2022-09-28 NOTE — ED Notes (Signed)
Formatting of this note might be different from the original.  ED Hourly rounding completed at this time. Patient is resting. Currently, patient has visitors. Updated the patient on plan of care including pending ED results and disposition status - pending transfer to other facility .  Call light at bedside and patient demonstrated usage.   Electronically signed by Zenda Alpers, RN at 09/28/2022  2:03 AM EDT

## 2022-09-28 NOTE — Unmapped (Signed)
Formatting of this note might be different from the original.  Roberto Rice    Test Name: BNP  Results:  587  09/28/2022  Time: 0114  Received from: [facility] in lab  R/V by: Harvin Hazel RN    (Required: Attempt notification within 30 minutes or document reason for no notification)    Physician: Dr. Pricilla Holm  09/28/2022  Time: 0120  Notified: Yes - No orders received  Electronically signed by Matilde Haymaker, RN at 09/28/2022  1:23 AM EDT

## 2022-09-28 NOTE — ED Provider Notes (Signed)
-------------------------------------------------------------------------------  Attestation signed by Estil Daft, MD at 10/01/2022 12:29 AM  I saw and evaluated the patient with the resident/fellow.  I discussed the case with the resident/fellow and agree with the findings and plan as documented.   -------------------------------------------------------------------------------    -    HPI    Chief Complaint   Patient presents with    Swelling    Vomiting    Diarrhea       This is a 37 year old male with past medical history of CKD status post bilateral kidney transplant, CHF who presented as a transfer from an outside hospital for AKI and fluid overload.  Patient reports that for the last 4 days he has been having increased nausea vomiting diarrhea and generalized fluid retention.  Patient went to [facility] this morning where he was found to have an AKI and was subsequently transferred to [facility].  Labs from outside hospital were significant for a creatinine of 11, phos 5.4, BNP over 500.  Patient reports no chest pain, but mild dyspnea when lying supine.      History provided by:  Patient  Language interpreter used: No                              No data recorded                                  Patient History    Past Medical History:   Diagnosis Date    Conversions - Other     Hypertension    Conversions - Other     Reported Prior Kidney Disease       No past surgical history on file.    Family History   Problem Relation Name Age of Onset    Heart disease Other      Hypertension Other      Hyperthyroidism Other      Obesity Other      Stroke Other         Tobacco Use    Smoking status: Never   Substance Use Topics    Alcohol use: No     Comment: Alcoholic Drinks/day: Never Drank Alcohol       Immunization History    Immunization History: reviewed    Allergies:   Allergies   Allergen Reactions    Nsaids Other - please document in the comment field     CANNOT TAKE DUE TO KIDNEY FAILURE    CANNOT  TAKE DUE TO KIDNEY FAILURE        Review of Systems    Review of Systems     Physical Exam    ED Triage Vitals [09/28/22 0826]   Temp Heart Rate Resp BP   36.9 C (98.5 F) 93 16 (!) 157/100      SpO2 Temp Source Heart Rate Source Patient Position   98 % Oral Monitor Lying      BP Location FiO2 (%)     Left arm --         Physical Exam  Vitals and nursing note reviewed.   Constitutional:       General: He is not in acute distress.     Appearance: He is well-developed.   HENT:      Head: Normocephalic and atraumatic.   Eyes:      Conjunctiva/sclera: Conjunctivae normal.   Cardiovascular:  Rate and Rhythm: Normal rate and regular rhythm.      Heart sounds: No murmur heard.  Pulmonary:      Effort: Pulmonary effort is normal. No respiratory distress.      Breath sounds: Normal breath sounds.   Abdominal:      Palpations: Abdomen is soft.      Tenderness: There is no abdominal tenderness.      Comments: Palpable mass in the right lower quadrant.   Musculoskeletal:         General: No swelling.      Cervical back: Neck supple.   Skin:     General: Skin is warm and dry.      Capillary Refill: Capillary refill takes less than 2 seconds.   Neurological:      Mental Status: He is alert.   Psychiatric:         Mood and Affect: Mood normal.            ED Course & MDM    ED Course as of 09/28/22 1514   Fri Sep 28, 2022   1511 Reviewed outside lab testing which was significant for phosphorus 5.4, creatinine of 11, BNP over 500 [RM]   1512 Consulted transplant nephrology team who agreed to see this patient. [RM]   [phone] [facility] who agreed to admit this patient. [RM]      ED Course User Index  [RM] Edmonia Caprio, MD         Clinical Impressions as of 09/28/22 1514   Acute renal failure, unspecified acute renal failure type (CMS/HCC)       -    Medical Decision Making  Dutch is a 37 y.o. male who presents to the ED with a chief complaint of Patient presents with:  Swelling  Vomiting  Diarrhea  .    It  should be noted that his chronic conditions includes CKD status post bilateral kidney transplant, which currently is not at goal therapy.  This complicates his clinical picture because it increases the risk for morbidity.    Based on his history and physical exam, my differential diagnosis included transplant rejection, acute renal failure. Ruling out the most morbid conditions drove my clinical assessment.     In order to fully explore differential these tests and treatments were ordered.    Orders Placed This Encounter      US Kidney Transplant      CMP      CBC w/diff      Type and screen      PT-INR      Magnesium      Phosphorus      Lactic acid, venous      Urinalysis with reflex microscopic      C-Reactive protein      Blood gas panel, venous      Tacrolimus      Adult diet Diet texture: Regular; Carbohydrate restriction: Consistent CHO 2 (1800-2000 Cal, 80 g/meal); Modified Calorie/Protein: High Calorie, High Protein      Mobility Orders      Notify physician (specify parameters)      Vital Signs      Pulse Oximetry      Do Not Give Nicotine Replacement      Update First Call Provider Assignment      Full code      Consult to Hospital Medicine - [facility]      Inpatient consult to Nephrology  EKG now - STAT (adult)      Insert peripheral IV      Saline lock IV      Admit to inpatient      ED to floor bed request     Medications  sodium chloride 0.9 % flush 10 mL (10 mL Intravenous Given 09/28/22 1358)    And  sodium chloride 0.9 % flush 10 mL (has no administration in time range)  heparin (porcine) injection 5,000 Units (5,000 Units Subcutaneous Not Given 09/28/22 1348)     Additional history was provided by no one     See ED Course for further details.     Complicating his clinical picture is the social fact that he is currently transferring his care from [facility] to [name] which puts him at higher risk for treatment failure and subsequent morbidity. We discussed that he would be admitted for further  management and evaluation.    Ultimately, this patient was  Was admitted  (Admission) The encounter diagnosis was Acute renal failure, unspecified acute renal failure type (CMS/HCC).. Patient believed to require admission for the listed diagnoses. The Internal Medicine service was consulted for admission and was agreeable to admit to Acute Floor (Med/Surg).      Date/Time: 09/28/2022/3:07 PM      Anise Salvo, MD  Emergency Medicine, PGY1                     ED Prescriptions    None              Disposition    *Admit    Admitting/Attending Physician: Lorenza Cambridge [[id number]]  Provider Care Team: [facility] [[id number]]  Are they the primary team?: Yes [1]           Sign Off Checklist  Clinical Impression: Complete  ED Disposition: Complete  -           Edmonia Caprio, MD  Resident  09/28/22 1515    *Some images could not be shown.

## 2022-09-28 NOTE — Unmapped (Signed)
Formatting of this note might be different from the original.  Roberto Rice    Test Name: Creatinine  Results:  11.3  09/28/2022  Time: 0037  Received from: [facility] Client in lab  R/V by: Harvin Hazel RN    (Required: Attempt notification within 30 minutes or document reason for no notification)    Physician: Dr. Pricilla Holm  09/28/2022  Time: 0047  Notified: Yes - No orders received  Electronically signed by Matilde Haymaker, RN at 09/28/2022 12:54 AM EDT

## 2022-09-28 NOTE — ED Notes (Signed)
Formatting of this note might be different from the original.  Steward Drone states she has contacted the transfer line.   Electronically signed by Zenda Alpers, RN at 09/28/2022  2:39 AM EDT

## 2022-09-28 NOTE — ED Notes (Signed)
Formatting of this note might be different from the original.  Called [facility], spoke to [place], ETA 3 hours.   Electronically signed by Zenda Alpers, RN at 09/28/2022  2:25 AM EDT

## 2022-09-29 DIAGNOSIS — I160 Hypertensive urgency: Secondary | ICD-10-CM

## 2022-09-29 DIAGNOSIS — T8619 Other complication of kidney transplant: Secondary | ICD-10-CM

## 2022-09-29 MED ORDER — famotidine (PEPCID) tablet 20 mg
20 | Freq: Two times a day (BID) | ORAL
Start: 2022-09-29 — End: 2022-09-30
  Administered 2022-09-30 (×2): 20 mg via ORAL

## 2022-09-29 MED ORDER — heparin (porcine) injection 5,000 Units
5000 | Freq: Three times a day (TID) | INTRAMUSCULAR | Status: AC
Start: 2022-09-29 — End: 2022-10-10
  Administered 2022-10-01: 01:00:00 5000 [IU] via SUBCUTANEOUS
  Administered 2022-10-09: 01:00:00 via SUBCUTANEOUS

## 2022-09-29 MED ORDER — polyethylene glycol (MIRALAX) packet 17 g
17 | Freq: Every day | ORAL | Status: AC
Start: 2022-09-29 — End: 2022-09-30

## 2022-09-29 MED ORDER — ICU ELECTROLYTE REPLACEMENT PROTOCOL
Status: AC | PRN
Start: 2022-09-29 — End: 2022-09-30

## 2022-09-29 NOTE — Unmapped (Signed)
Formatting of this note might be different from the original.  Bedside report given to [place], Charity fundraiser. Plan of care discussed. 12 hour chart check, labs and skin integrity reviewed. Patient in bed, resting, vital signs stable (see docflowsheets), no distress noted, bed in lowest position, side rails up X3, call light and bedside table within reach. Oncoming nurse expresses no further questions or concerns.     Electronically signed by Geralyn Flash, RN at 09/29/2022  4:31 PM EDT

## 2022-09-29 NOTE — Unmapped (Signed)
Formatting of this note might be different from the original.  Roberto Rice    Test Name: creat  Results:  12.8  09/29/2022  Time: 0637  Received from: LAB  R/V by: Milinda Pointer RN    (Required: Attempt notification within 30 minutes or document reason for no notification)    Physician: Chanin APRN  09/29/2022  Notified: No - Due to Expected Result: renal patient      Electronically signed by Christia Reading, RN at 09/29/2022  6:40 AM EDT

## 2022-09-29 NOTE — Progress Notes (Signed)
Formatting of this note is different from the original.  Nursing End of Shift Summary    Pertinent changes to patient conditions and/or care this shift: Patient arrived to ICU 6 a little after 4am. Patient blood sugar 49 received one amp of D50. Blood pressure high, Chanin, APRN notified. Patient received multiple medications to help lower it. Patient complaining of moderate pain through out entire body, one time order percocet given.       Vital Signs Weight: 94.5 kg (208 lb 5.4 oz) (09/29/22 0413)    Temp: 97.3 F (36.3 C)  Temp Source: Axillary    Heart Rate:75    BP: (!) 155/112    Respirations: 12   Oxygen Saturation SpO2: 100 %  O2 Delivery: Room air    Incentive Spirometry Incentive Spirometry: No      Diet Diet Renal;  Tolerated Diet: Yes   Intake/Output Totals  Intake/Output          09/28/22 0700 - 09/29/22 0659 (Not Admitted)     [id number] 1900-0659 Total        Intake    P.O.  --  240 240    I.V.  --  20 20    Blood  --  0 0    Other  --  0 0    Total Intake -- 260 260      Output    Urine  --  0 0    Urine -- 0 0    Urine Occurrence -- 0 x 0 x    Emesis/NG output  --  0 0    Emesis -- 0 0    Emesis Occurrence -- 0 x 0 x    Other  --  0 0    Other -- 0 0    Stool  --  0 0    Stool Occurrence -- 0 x 0 x    Stool -- 0 0    Blood  --  0 0    Blood output -- 0 0    Total Output -- 0 0             Activity Activity: Bedrest      Ambulation Attempts this Shift: Did not ambulate this shift    Telemetry Cardiac Rhythm: Normal Sinus Rhythm (09/29/22 0600)   Telemetry Abnormalities No     Labs No results found for: FUNCTIONAL, PLTAGGREG    Lab Results   Component Value Date    RBC 3.42 (L) 09/29/2022    WBC 4.8 09/29/2022    HCT 31.8 (L) 09/29/2022    HGB 10.9 (L) 09/29/2022    PLATELETCNT 294 09/29/2022    MCH 31.7 09/29/2022    MCHC 34.1 09/29/2022    MCV 93.1 09/29/2022    MPV 7.7 09/29/2022     Lab Results   Component Value Date    ALBUMIN 3.8 09/29/2022    SODIUM 138 09/29/2022    POTASSIUM 5.1 (H)  09/29/2022    CHLORIDE 106 09/29/2022    CO2 15 (L) 09/29/2022    ANIONGAP 17 09/29/2022    GLUCOSE 81 09/29/2022    CREATININE 12.9 (HH) 09/29/2022    BUN 67 (H) 09/29/2022    CALCIUM 8.9 09/29/2022    PROTEINTOTAL 7.7 09/29/2022    TBILIRUBIN 0.8 09/29/2022    ALP 85 09/29/2022    AST 15 09/29/2022    ALTSGPT 13 09/29/2022    OSMOLALITY 294 09/29/2022    AGRATIO 1.0 09/29/2022  BC 5 (L) 09/29/2022    ESTIMATEDGFR 4 09/29/2022         Taking all meds Yes   Any PRN meds given? No    Current IV Infusions     Diuretics      SDOH Screen Assessed? Completed   Discharge Planning     Expected Discharge Date    Education Provided Other (Comment) see careplain           Electronically signed by Tarry Kos, RN at 09/29/2022  6:32 AM EDT

## 2022-09-29 NOTE — Unmapped (Signed)
Formatting of this note might be different from the original.  Bedside report received from [place], [name]. Plan of care discussed. 12 hour chart check, labs and skin integrity reviewed. Patient in bed, resting, vital signs stable (see docflowsheets), no distress noted, bed in lowest position, side rails up X3, call light and bedside table within reach. No further questions at this time.     Electronically signed by Geralyn Flash, RN at 09/29/2022  7:10 AM EDT

## 2022-09-29 NOTE — Assessment & Plan Note (Signed)
Associated Problem(s): Renal transplant, status post  Formatting of this note might be different from the original.  Renal transplant  -rejecting  -working on transfer to transplant center    Acute on chronic renal failure  -rejecting transplant. Baseline cr 2 now 12.  K ok.   -line placed for dialysis  -renal following  -working on transfer to transplant center    Full code    Spoke with [facility], where his transplant was done, and they are refusing his transfer given he had requested transfer or records to [name] despite not actually being set up there.  The patient is refusing [name] due to the treatment he received in the ER after transfer there yesterday.  [facility] has accepted the patient for inpatient care at this time.  Will call after dialysis complete to evaluate stability for transfer.   Electronically signed by Zada Girt, DO at 09/29/2022  2:07 PM EDT

## 2022-09-29 NOTE — ED Notes (Signed)
Formatting of this note might be different from the original.  Report called to ICU6. All questions answered at this time.   Electronically signed by Joya Martyr, RN at 09/29/2022  3:42 AM EDT

## 2022-09-29 NOTE — Progress Notes (Signed)
Formatting of this note might be different from the original.  Patient arrived to ICU bed 6 via stretcher from ED with RN at this time. Patient attached to continuous ICU monitoring, VSS. Patient refusing to be placed in gown at this time.   Electronically signed by Tarry Kos, RN at 09/29/2022  4:26 AM EDT

## 2022-09-29 NOTE — Procedures (Signed)
Associated Order(s): [employer] of this note is different from the original.  Roberto Rice is a 37 y.o. male patient.  1. Hyperkalemia    2. Acute kidney injury superimposed on chronic kidney disease (CMS/HCC)    3. Bilateral lower extremity edema      Past Medical History:   Diagnosis Date    Blood transfusion, without reported diagnosis     Hemodialysis     m-w-f  in [place]    Hyperlipidemia     Hypertension     Kidney disease approx 4 weeks    esrd dialysis m- w- f [place]     Blood pressure (!) 163/117, pulse 78, temperature 97.8 F (36.6 C), resp. rate (!) 11, height 5' 11 (180.3 cm), weight 94.5 kg (208 lb 5.4 oz), SpO2 97%.    Central Line    Date/Time: 09/29/2022 2:02 PM    Performed by: Zada Girt, DO  Authorized by: Zada Girt, DO    Consent:     Consent obtained:  Written    Consent given by:  Spouse    Risks, benefits, and alternatives were discussed: yes      Risks discussed:  Arterial puncture, incorrect placement, nerve damage, bleeding, infection and pneumothorax    Alternatives discussed:  No treatment  Universal protocol:     Procedure explained and questions answered to patient or proxy's satisfaction: yes      Relevant documents present and verified: yes      Test results available: yes      Imaging studies available: yes      Required blood products, implants, devices, and special equipment available: yes      Site/side marked: yes      Immediately prior to procedure, a time out was called: yes      Patient identity confirmed:  Verbally with patient and arm band  Pre-procedure details:     Indication(s) comment:  Need for dialysis    Hand hygiene: Hand hygiene performed prior to insertion      Sterile barrier technique: All elements of maximal sterile technique followed      Skin preparation:  Chlorhexidine  Sedation:     Sedation type:  None  Anesthesia:     Anesthesia method:  Local infiltration    Local anesthetic:  Lidocaine 1% w/o epi  Procedure details:      Location:  L internal jugular    Site selection rationale:  Line already in place, exchanged    Patient position:  Supine    Procedural supplies:  Double lumen    Catheter size:  13.5 Fr    Landmarks identified: yes      Ultrasound guidance: no      Number of attempts:  1    Successful placement: yes    Post-procedure details:     Post-procedure:  Dressing applied and line sutured    Assessment:  Blood return through all ports, no pneumothorax on x-ray, free fluid flow and placement verified by x-ray    Procedure completion:  Tolerated  Comments:      15 cm dialysis line in place however too short.  Guidewire exchange performed and 24 cm line place    Zada Girt, DO  09/29/2022    Electronically signed by Zada Girt, DO at 09/29/2022  2:03 PM EDT

## 2022-09-29 NOTE — Consults (Signed)
Associated Order(s): IP CONSULT TO NEPHROLOGY  Formatting of this note is different from the original.  Images from the original note were not included.      [facility]  CONSULTATION REPORT    REQUESTING PROVIDER  Dr. Pricilla Holm  REASON FOR CONSULTATION  Evaluation of AKI/CKD S/p renal transplant in 2018    CHIEF COMPLAINT unresponsive    HISTORY OF PRESENT ILLNESS    Patient is a 37 y.o.  male, known to have a background history of CTD, hypertension, presented into [facility] on 09/29/22 for evaluation and management of unresponsive. He is currently being referred to Nephrology for AKI/CKD S/p renal transplant. The patient was recently admitted to [name] with nausea, vomiting, diarrhea for 5 days prior to admission. The patient was noted to have signed out AMA at that time. On the drive home he was would to be unresponsive and EMS was called. The patient notes that he has history of renal transplant in 2018 at [facility]. Home medications on review included Prograf, Bactrim, Myfortic, Prednisone.Patient was subsequently admitted with the clinical impression of AKI/CKD. MAR review noted temporizing measures, Cellcept, Prograf, Prednisone, Bactrim. Hemodynamic data showed no hypotension, fever, or tachycardia. No IV contrast studies noted during the current hospital admission. Initial routine metabolic panel noted acute renal failure with a creatinine level of 12.8 mg/dl [Baseline creatinine 1.6-1.0 mg/dl?]. It has been in relation to the latter finding that the patient has been referred to Nephrology for further evaluation and management.     REVIEW OF SYSTEMS  Constitutional: No fever/chills. No weight loss.   Eyes, Ears, Nose & Throat: No blurring of vision. No hearing impairment. No sinus congestion. No sore throat.  Respiratory: No shortness of breath. No coughs/colds.   Cardiac: No chest pain. No orthopnea/PND. No palpitations. No diaphoresis.  Abdomen: No abdominal pain. No  nausea, vomiting or diarrhea.  Genitourinary: Good urine output. No dysuria, hematuria, hesitancy, urgency or urinary incontinence.  Musculoskeletal: No arthritis. No swelling.  Neurologic: No headaches. No dizziness. No syncope. No seizures.  Psychiatric: No anxiety or depression.  Skin: No new skin rashes.   Otherwise, the rest of the systems review was negative.     Medical History  Past Medical History:   Diagnosis Date    Blood transfusion, without reported diagnosis     Hemodialysis     m-w-f  in [place]    Hyperlipidemia     Hypertension     Kidney disease approx 4 weeks    esrd dialysis m- w- f [place]     Surgical History  Past Surgical History:   Procedure Laterality Date    AV FISTULOGRAM N/A 10/04/2011    AV FISTULOGRAM performed by Audelia Acton, MD at [facility] VASCULAR LABS    EMBOLIZATION Right 10/04/2011    EMBOLIZATION performed by Audelia Acton, MD at [facility] VASCULAR LABS    HX ARTERIO-VENOUS FISTULA Right 08/16/2011    ARTERIO-VENOUS FISTULA performed by Audelia Acton, MD at [facility] CVOR    HX BROKEN/FX BONES      lt ankle ten pins and a plate    HX COLONOSCOPY N/A 07/01/2022    COLONOSCOPY /C ARGON PLASMA COAGULATION performed by Princess Bruins, MD at [facility] MAIN OR    HX OTHER SURGICAL HISTORY      tunnel cath 06/2011 [facility]    HX ULTRASOUND, INTRAOPERATIVE Right 08/16/2011    ULTRASOUND, INTRAOPERATIVE performed by Audelia Acton, MD at [facility] CVOR    TUNNEL CATH  PLACEMENT N/A 08/02/2011    TUNNEL CATH PLACEMENT performed by Audelia Acton, MD at [facility] VASCULAR LABS    VENOUS PTA Right 10/04/2011    VENOUS PTA performed by Audelia Acton, MD at [facility] VASCULAR LABS     MEDICATIONS  Reviewed on Epic.   Medications Prior to Admission   Medication Sig Dispense Refill Last Dose    mycophenolate sodium (MYFORTIC) 180 mg DR tablet Take 3 Tabs by mouth Twice a day for 90 days. 540 Tablet 0 09/28/2022    cyclobenzaprine (FLEXERIL) 10 mg tablet Take 1 Tablet by mouth Three times a day as needed. 20 Tablet 0 09/28/2022     methylPREDNISolone (MEDROL, PAK,) 4 mg tablet Take 1 Tablet by mouth As directed. Follow package directions. 21 Tablet 0 09/28/2022    [EXPIRED] potassium chloride SA (KLOR-CON M20) 20 mEq tablet Take 2 Tabs by mouth One time only for 1 dose. 2 Tablet 0     tacrolimus (PROGRAF) 1 mg Take 6 mg by mouth At bedtime.   09/28/2022    sulfamethoxazole-trimethoprim (BACTRIM) 400-80 mg per tablet Take 1 Tablet by mouth Monday, Wednesday and Friday.   09/28/2022    cholecalciferol, vitamin D3, (VITAMIN D3) Tab tablet Take 2,000 Units by mouth Once Daily.   09/28/2022    isosorbide mononitrate (IMDUR) 30 mg CR tablet Take 30 mg by mouth Once Daily.   09/28/2022    magnesium oxide (MAG-OX) 400 mg tablet Take 1 Tablet by mouth Once Daily. 30 Tablet 3 09/28/2022    NIFEdipine (PROCARDIA) 10 mg capsule Take 60 mg by mouth Twice a day.   09/28/2022    tacrolimus (PROGRAF) 5 mg Take 5 mg by mouth once daily with breakfast.   09/28/2022    predniSONE (DELTASONE) 10 mg tablet Take 5 mg by mouth Once Daily. Take 3 tabs daily for 14 days, then 2 tabs daily for 14 days, then 1 tab daily   09/28/2022     ALLERGIES  Nsaids (non-steroidal anti-inflammatory drug)    Social History  Social History     Socioeconomic History    Marital status: Single   Tobacco Use    Smoking status: Never   Vaping Use    Vaping Use: Never used   Substance and Sexual Activity    Alcohol use: No    Drug use: Yes     Types: Marijuana     Family History  Family History   Problem Relation Name Age of Onset    Hypertension Mother      Hypertension Father       PHYSICAL EXAM  LAST RECORDED VITAL SIGNS  Temp: 97.3 F (36.3 C)  Heart Rate (Monitor): 76  Pulse: 73  BP: (!) 156/106  Respirations: 13  SpO2: 98 %    GENERAL: Not in acute distress. Appears comfortable.  EYES, EARS, NOSE & THROAT: Normocephalic. Clear sclera. Moist oral mucosa.  NECK: No JVP elevation. Supple.   LUNGS: Good air entry. Generally clear to auscultation bilaterally. No crackles.   CARDIAC: Regular rate and  rhythm. Good S1 and S2. No murmurs or rubs appreciated.  ABDOMEN: Soft and nontender. Bowel sounds present. No bruits appreciated.  MUSCULOSKELETAL: No dependent edema.    NEUROLOGIC: Awake. No myoclonus.   SKIN: Warm and dry.   ACCESS: temp cath present     LABORATORY DATA     CBC:   Recent Labs     09/29/22  0503 09/29/22  0040 09/28/22  0007   WBC 4.8  5.2 6.1   RBC 3.42* 3.61* 3.23*   HGB 10.9* 11.4* 10.3*   HCT 31.8* 33.4* 29.9*   MCV 93.1 92.6 92.5   PLATELETCNT 294 346 328     BMP:   Recent Labs     09/29/22  0503 09/29/22  0040 09/28/22  0006   SODIUM 136 138 138   POTASSIUM 4.2 5.1* 3.5*   CHLORIDE 105 106 104   CO2 16* 15* 19*   BUN 71* 67* 64*   CREATININE 12.8* 12.9* 11.3*   ESTIMATEDGFR 4 4 5    CALCIUM 8.8 8.9 8.2*   MAGNESIUM  --  2.1 2.0   PHOSPHORUS  --  5.8* 5.4*   GLUCOSE 129* 81 92   OSMOLALITY 294 294 294   TBILIRUBIN  --  0.8 0.6   ALP  --  85 74   AST  --  15 14   ALBUMIN  --  3.8 3.5     ABG:   Recent Labs     09/29/22  0058   PHBLOOD 7.35   PCO2ARTERIAL 27*   PO2 99   HCO3 17.6*   O2SATURATION 99.4     URINALYSIS   No results found for this visit on 09/29/22 (from the past 72 hour(s)).    IMAGING    No new renal imaging studies available for review.    ASSESSMENT AND PLAN  1.  AKI/Chronic Kidney Disease.  AKI likely of prerenal etiology vs early ATN, related to volume contraction complicated by Bactrim use. Tacrolimus level pending.  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 Astrid Drafts in CKD clinic. I have requested to send for the following diagnostic studies- FENa. Renal US at [name] yesterday noted no hydronephrosis. Renal function worsening. Recommend initiation of RRT for severe AKI with electrolyte disturbances. Continue on current IS for now, will discuss further with Dr. Adora Fridge. Avoid nephrotoxin exposure, including but not limited to NSAIDs and IV dye.   ** I have discussed with the patient the mechanism of dialysis, the risks of dialysis (including, but not  limited to, bleeding, infection, irregular heartbeat, nausea, vomiting, allergic reaction, seizures, hypotension, cardiac ischemia, stroke and death), benefits of treatment, potential problems and the alternatives (no treatment alternatives to dialysis except refusing). Patient understands situation, agreeable, consent on chart.  2.  Hypertension. Blood pressures elevated. Currently on Nifedipine, S/p IV Labetalol and Hydralazine. Continue to monitor. Goal BP < 140/80 mmHg.  3.  Hyperkalemia. Likely related to AKI complicated by Bactrim use. Currently on Bactrim for IS with history of renal transplant. Improved with temporizing measures. Continue to monitor for now.  4.  Acidosis. Severe. ABG reviewed. Expect improvement with HD, plan for higher bicarb buffer.  5.  Hyperphosphatemia. Recommend low phos diet. Consider binders if phos > 6.  6.  Medications. Please dose medications for CrCl < 10 ml/min in the setting of ARF.     ** Critical Care attempting to facilitate transfer to tertiary care facility.     Medications, Lab results reviewed.     Thank you for allowing me to participate in the care of your patient.    Signed:  Suann Larry, APRN  09/29/2022  8:02 AM    NEPHROLOGIST ATTESTATION:   Patient seen initially by Advanced Practice Provider, discussed with me, and then I independently evaluated the patient, reviewed labs, imaging, medications and performed a physical exam. A summary is documented below.    Subjective: Admitted with severe AKI,  Acute metabolic encephalopathy. Initially presented into [facility] after reported episode of unresponsiveness driving back from [facility] to [place]. History remarkable for complaints of generalized weakness, decreased appetite, nausea, vomiting and diarrhea, decreased UOP x few days. No reported fevers/chills. Home meds included Prograf, Cellcept and Prednisone. Patient presented to [facility] ER- sent to [name] ER. However, he apparently got frustrated about not getting timely  medical care and opted to leave the medical facility after waiting for a few hours. Girlfriend at bedside opted to bring him back to [facility] ER after had an episode of unresponsiveness on the drive back to [facility]. 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 Clinical evaluation on presentation noted above findings. ER Provider had placed temp HD catheter and admitted the patient into the ICU for further evaluation and management. Hospitalist currently following. Hemodynamics noted elevated Bp readings. MAR review noted Ampicillin, Bactrim. No Iv contrast studies. Routine labs noted AKI Cr 12.9 mg/dl. Nephrology consulted in relation to the latter process.     Review of Systems:  Constitutional: No fever/chills.   Respiratory: No shortness of breath. No coughs/colds.   Cardiac: No chest pain. No orthopnea/PND. No palpitations.   Musculoskeletal: No swelling.    Clinical exam:   Lung breath sounds diminished at bases.   Cardiac Regular rhythm.   Musculoskeletal: Noted trace dependent edema.     Assessment/Plan  1.  AKI/Chronic Kidney Disease vs Ckd progression in Renal transplant patient. Severe AKI/Prerenal azotemia vs ATN related to volume contraction complicated by Bactrim, Prograf use. ?Acute rejection. CKD Stage 4t with a recent baseline Cr of 2.4-2.7 mg/dl mg/dl- followed by Dr. Astrid Drafts. Follow up on AKI work up requested. Remains in severe renal failure, complicated by early uremic symptoms, hyperkalemia and metabolic acidosis. May proceed with planned HD initiation. I have discussed with the patient the mechanism of dialysis, the risks of dialysis (including, but not limited to, bleeding, infection, irregular heartbeat, nausea, vomiting, allergic reaction, seizures, hypotension, cardiac ischemia, stroke and death), benefits of treatment, potential problems and the alternatives (no  treatment alternatives to dialysis except refusing). Patient understands situation, expressed verbal consent to dialysis, and nurse taking care of the patient witnessed the consent. Dr Hermenia Bers planning to change HD access. Check acute hepatitis panel. Recommend 1st HD treatment today- HD orders discussed with HD Charge RN. Continue current regimen for IS and prophylaxis. Avoid nephrotoxin exposure, including but not limited to NSAIDs and IV dye. Recommend urgent transfer of patient to a renal transplant center for higher level of care.   2.  Renal Hypertension. Bp poorly controlled. Recommend restarting home medications. May add Nifedipine XL 60 mg daily. Follow hemodynamics. Target Bp <140/80 mmHg.   3.  Hyperkalemia. Related to Prograf, Bactrim and AKI. Medical management initiated. Recommend low K bath on HD. Expect resolution. Target Bp <140/80 mmHg.   4.  Metabolic Acidosis. Related to AKI. ABG reviewed. Recommend bicarb buffer 40 on HD.   5.  Hyperphosphatemia. Recommend low phos diet. Consider binders if Phos >6.  6.  Acute metabolic encephalopathy. Defer management to Neurology and Primary.  7.  Medications. Please dose medications for CrCl <10 ml/min in the setting of ARF.   8.  See APRN Note for the rest of the medical plan and recommendations.     AKI requiring urgent HD initiation carries a high level of risk.   > 4  problem points.   High complexity MDM    Note: Case discussed with Dr Hermenia Bers. Spent 35 minutes of critical care time reviewing the patient's chart, evaluating the patient, and coordinating above plan of care including dialysis initiation.     I performed an independent review of the records before the face-to-face encounter, and I independently collected history and examined the patient. The medical decision making was generated largely by me based on my own review of the findings, face-to-face time with patient and/or family, and using my nephrology expertise in reviewing the clinical and  laboratory facts to generate a care plan.     Signed:  Chaya Jan, M.D., MD  09/29/2022    Electronically signed by Tommy Medal, MD at 09/29/2022 10:52 AM EDT

## 2022-09-29 NOTE — Progress Notes (Signed)
Formatting of this note is different from the original.  HOSPITAL MEDICINE PROGRESS NOTE    Patient:  Roberto Rice  MRN:  [id number]  Room: ICCU06/ICCU06A  Admit Date: 09/29/2022  Hospital Day:   LOS: 0 days   Current Date: 09/29/2022    Chief Complaint - follow-up for Renal transplant, status post     Hospital Course to Date: No notes on file    Subjective/Interval History: pt doing about the same. Tired this AM.  Inappropriate central line position in place and exchanged.  Multiple discussions in regards to where he needs to be given his renal transplant rejection.      Relevant ROS: ROS pos fatigue. Pos neck pain. Neg sob. Neg chest pain     Physical Exam for 09/29/2022  Blood pressure (!) 164/118, pulse 82, temperature 97.8 F (36.6 C), resp. rate 13, height 5' 11 (180.3 cm), weight 94.5 kg (208 lb 5.4 oz), SpO2 99%.  Physical Exam    Physical exam:   General Appearance: alert, NAD   Neck: no JVD, no LN enlargement   Lungs: clear, no wheezing    Heart: RRR, S1 S2 heard   Abdomen / GI: soft, non-tender, no ascites   Extremities / musculoskeletal: no edema, no synovitis    Skin: no rash, dry skin   Neuro: follows commands, moves extremities     Labs and Imaging:  Recent Labs     09/29/22  0503 09/29/22  0040 09/28/22  0007 09/28/22  0006   WBC 4.8 5.2 6.1  --    RBC 3.42* 3.61* 3.23*  --    HGB 10.9* 11.4* 10.3*  --    HCT 31.8* 33.4* 29.9*  --    MCV 93.1 92.6 92.5  --    PLATELETCNT 294 346 328  --    SODIUM 136 138  --  138   POTASSIUM 4.2 5.1*  --  3.5*   MAGNESIUM  --  2.1  --  2.0   PHOSPHORUS  --  5.8*  --  5.4*   CHLORIDE 105 106  --  104   CO2 16* 15*  --  19*   GLUCOSE 129* 81  --  92   BUN 71* 67*  --  64*   CREATININE 12.8* 12.9*  --  11.3*   CALCIUM 8.8 8.9  --  8.2*   TBILIRUBIN  --  0.8  --  0.6   ALP  --  85  --  74   AST  --  15  --  14   ALBUMIN  --  3.8  --  3.5   INR  --  1.0  --   --      Cultures: Urine:  Lab Results   Component Value Date/Time    URINECULTURE No growth. 05/31/2021 1110      Blood (peripheral):  Lab Results   Component Value Date/Time    BLOODCULTURE  06/11/2022 0719     No growth @ 24 hours.  No growth @ 48 hours.  No growth @ 72 hours.  No growth @ 96 hours.  No growth @ 120 hours.      Respiratory:No results found for: [facility]    Imaging and Procedures  Reviewed     Assessment and Plan     Hospital Problems and Relevant comorbid conditions:  Principal Problem:    Renal transplant, status post  Active Problems:    Acute kidney injury superimposed on CKD (CMS/HCC)    *  Renal transplant, status post  Renal transplant  -rejecting  -working on transfer to transplant center    Acute on chronic renal failure  -rejecting transplant. Baseline cr 2 now 12.  K ok.   -line placed for dialysis  -renal following  -working on transfer to transplant center    Full code    Spoke with [facility], where his transplant was done, and they are refusing his transfer given he had requested transfer or records to [name] despite not actually being set up there.  The patient is refusing [name] due to the treatment he received in the ER after transfer there yesterday.  [facility] has accepted the patient for inpatient care at this time.  Will call after dialysis complete to evaluate stability for transfer.     Assessment and plan generated using problem oriented charting. I have personally reviewed the previous A/P notes and have included what is applicable today.     Roberto Pulling, DO  09/29/2022      Electronically signed by Zada Girt, DO at 09/29/2022  2:11 PM EDT

## 2022-09-29 NOTE — Unmapped (Signed)
Formatting of this note might be different from the original.  Patient tolerated 2.5 hour HD with 983 ml removed. Blood returned per protocol. Blood pressures remain elevated after HD. Report given to [place], Charity fundraiser.     Electronically signed by Wallace Cullens, RN at 09/29/2022  2:57 PM EDT

## 2022-09-29 NOTE — Unmapped (Signed)
Formatting of this note might be different from the original.  Handoff report given to [place], Charity fundraiser.  Questions answered per policy.    Electronically signed by Tarry Kos, RN at 09/29/2022  7:10 AM EDT

## 2022-09-29 NOTE — ED Provider Notes (Signed)
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 beats per minute, No evidence of acute ST segment elevation. Per my independent interpretation, EKG does not meet STEMI criteria.  0055 hours:  Baseline troponin is 14 ng/L, Phosphorous is 5.8.  0156 hours:  One hour troponin is 14 ng/L.  0305 hours:  Creatinine is 12.9.  0311 hours:  I consulted with Dr. Adora Fridge, Nephrology. I spoke with them about the patient's history of present illness, workup, and course.  0315 hours:  I consulted with Dr. Anne Hahn, [facility] Medicine. I spoke with them about the patient's history of present illness, workup, and course. Dr.  Willis accepts the patient to medicine.  Reevaluation:  I spoke with the patient about their workup and diagnosis. I provided the patient with results and addressed all questions. I discussed with the patient regarding assessment and plan for admission to medicine for further evaluation and treatment. All questions answered. The patient is accepted to medicine by Dr. Anne Hahn, [facility] Medicine. The patient verbalized understanding and agreement to admission to the [place] [facility].        ED Prescriptions    None      Final diagnoses:   Hyperkalemia   Acute kidney injury superimposed on chronic kidney disease (CMS/HCC)   Bilateral lower extremity edema     ED Disposition       ED Disposition    Admitted    Condition   --    Comment   Hospital Area: [facility] [[id number]]   Bed Type: ICU [7]   Bed Reason: Medical Necessity [2]            Scribe Attestation:  By signing below, I Chip Midge Minium attest that this document has been prepared in the presence of and under the direction of Sylvan Cheese F, DO.  Electronically signed by Victorio Palm, at 12:41 AM on September 29, 2022.    Physician Attestation:  I personally performed the services described in the documentation, reviewed the documentation recorded by the scribe in my presence and it accurately and completely records my words and actions.   Electronically Signed By Linton Rump, DO 10/01/2022 5:49 AM    Electronically signed by Linton Rump, DO at 10/01/2022  6:48 AM EDT

## 2022-09-29 NOTE — H&P (Signed)
Formatting of this note is different from the original.  Images from the original note were not included.      HISTORY AND PHYSICAL    Service: Hospital Medicine   Author: Christoper Fabian, MD  09/29/2022    Patient Name: Roberto Rice  ZOX:[id number]  D.O.B. Aug 01, 1985  PMD: No primary care provider on file.  Admission date: 09/29/2022 12:31 AM    History of Present Illness:       Brandonn Fleury is a 37 y.o. male with past medical history of CKD s/p transplant in 2018, who had initially presented to [facility] on 7/4 with c/o edema, vomiting, diarrhea, and decreased urine output. He has been experiencing vomiting and diarrhea for the past 4-5 days. He reports feeling diffusely swollen and complains of dyspnea that he associated with fluid overload. Patient requested transfer to [name], and was accepted and admitted. Patient signed out AMA on 7/5. While on his way back, girlfriend noted that he became unresponsive and parked the car and activated EMS. Patient is awake and alert when seen in the ICU, he does not remember what happened in the car ride.     Of note, he is currently trying to transition transplant care from [facility] to [name] and has had little contact with a transplant team recently.     He was hospitalized 07/08/22-07/13/22 due to abdominal pain and was found to have AKI. A biopsy demonstrated acute cell mediated rejection. He reportedly left AMA, although patient denies.He was also hospitalized 08/01/22-08/07/22 due to diarrhea with C. Diff colonization, GIP+ for norovirus. He was found to have AKI from volume depletion with diarrhea and emesis as well as transplant rejection.     Past Medical History:         Patient  has a past medical history of Blood transfusion, without reported diagnosis, Hemodialysis, Hyperlipidemia, Hypertension, and Kidney disease (approx 4 weeks).    Past Surgical History:       Patient  has a past surgical history that includes Hx Broken/Fx Bones; Hx Other  Surgical History; AV Fistulogram (N/A, 10/04/2011); Embolization (Right, 10/04/2011); Venous PTA (Right, 10/04/2011); Hx Arterio-Venous Fistula (Right, 08/16/2011); Hx Ultrasound, Intraoperative (Right, 08/16/2011); Tunnel catheter placement (N/A, 08/02/2011); and Hx Colonoscopy (N/A, 07/01/2022).    Family History:          Patient's family history includes Hypertension in his father and mother.     Social History:        He  reports no history of alcohol use.  He  reports current drug use. Drug: Marijuana.  He  reports that he has never smoked. He does not have any smokeless tobacco history on file.    Allergies:          Nsaids (non-steroidal anti-inflammatory drug)    Home Medications:       (Not in a hospital admission)    Review of Systems:       12 systems of ROS negative unless mentioned above in HPI    Physical Exam:         Estimated body mass index is 30.18 kg/m as calculated from the following:    Height as of this encounter: 5' 11 (180.3 cm).    Weight as of this encounter: 98.2 kg (216 lb 6.4 oz).  Temp: 98.7 F (37.1 C)  Heart Rate (Monitor): 70  Pulse: 71  BP: (!) 171/117  Respirations: 12  SpO2: 100 %    Vitals noted  and reviewed  Constitutional: NAD. lethargic  HENT:   Head: Normocephalic and atraumatic.   Eyes: Pupils are equal, round, and reactive to light. Conjunctivae are normal.   Neck: Normal range of motion. Neck supple.   Cardiovascular: Normal rate and regular rhythm.   Pulmonary/Chest: Effort normal. No respiratory distress.  Abdominal: Soft. Bowel sounds are normal. Exhibits no distension and no mass. There is no abdominal tenderness. There is no rebound and no guarding.   Musculoskeletal: Normal range of motion.         General: +2 edema  Neurological: No gross cranial nerve deficit.   Skin: Skin is warm. No erythema.   Psychiatric: normal mood and affect.     Imaging:        Imaging studies were personally reviewed and shows;   CT HEAD R/O STROKE   Final Result   Addendum (preliminary) 1 of  1   The findings were verbally communicated via telephone conference at 1:11    AM EDT    on 09/29/2022 with Sylvan Cheese.    The findings were acknowledged and understood.   THIS DOCUMENT HAS BEEN ELECTRONICALLY SIGNED BY Leotis Shames, MD on    09/29/2022 01:11    AM     Final   IMPRESSION:    No acute intracranial abnormality.    ASSESSMENT:    ASPECTS (Alberta Stroke Program Early CT Score) is 10.    THIS DOCUMENT HAS BEEN ELECTRONICALLY SIGNED BY Leotis Shames, MD ON 09/29/2022 12:59    AM     XR Portable Chest    (Results Pending)       Labs:           Lab Results   Component Value Date    WBC 5.2 09/29/2022    HGB 11.4 (L) 09/29/2022    HCT 33.4 (L) 09/29/2022    MCV 92.6 09/29/2022     Lab Results   Component Value Date    GLUCOSE 92 09/28/2022    CALCIUM 8.2 (L) 09/28/2022    CO2 19 (L) 09/28/2022    BUN 64 (H) 09/28/2022    CREATININE 11.3 (HH) 09/28/2022     Lab Results   Component Value Date    INR 1.0 09/29/2022    INR 1.0 06/30/2022    PROTIME 12.2 09/29/2022    PROTIME 12.0 06/30/2022     Lab Results   Component Value Date    CALCIUM 8.2 (L) 09/28/2022     Lab Results   Component Value Date    AST 14 09/28/2022     No results found for: LIPASE    Assessment / Plan:      The patient is 37 y.o., male  with aforementioned past medical and surgical history presented  in ED with    Acute encephalopathy  AKI on CKD  Hypertension  Hx of kidney transplant    -CT head negative, seen by tele neuro, likely 2/2 underlying AKI/uremia, more awake and alert now  -HD catheter placed in the ED, nephrology consult, will likely need dialysis, renal diet  -resume home antihypertensives, hydralazine PRN  -resume anti-rejection home meds    DVT ppx:   [x]  HSQ / Lovenox  []  Coumadin  []  NOAC  []  Hep gtt  []  SCD  []  Low risk, early ambulation    Code status:   [x]  Full  []  DNR    I discussed all significant results as well as my impression and recommendations with the patient/family. I  gave the patient opportunity to ask any  questions and answered them to the best of my ability. Patient/Family expressed understanding and agreement with the plan.     Electronically Signed By: Christoper Fabian, MD  09/29/2022  2:26 AM    ?By signing this note, I attest that I have reviewed and, if necessary, edited the information contained in this note.?  Electronically signed by Christoper Fabian, MD at 09/29/2022  6:26 AM EDT

## 2022-09-29 NOTE — Consults (Signed)
Formatting of this note might be different from the original.  **Physician Signature**  This document was electronically signed by: Marylynn Pearson MD      **Consult Cover Page**  FROM: [facility] Cool, [id number]  Call Back Number: [phone]  SUBJECT: Consult Recommendations  Date and Time of Report: 09/29/2022 01:37 AM ET  Items Contained in this Document: Neurology Consult Note    **Consult Information**  Member Facility: [facility]  Facility MRN: [id number]  Visit/Encounter Number: [id number]  Consult ID: [id number]  Facility Time Zone: ET  Date and Time of Request: 09-29-2022 12:44 AM ET  Requesting Clinician: Dr. Pricilla Holm  Patient Name: Roberto Rice, Roberto Rice  Date of Birth: [birth year]  Gender: Male  Patient identity was confirmed at the beginning of the consult with the patient/family/staff using two personal identifiers: Patient name and DOB    **Reason for Consult**  Reason for Consult: Code Stroke TLKW less than 4.5 hours    **General**  Chief Complaint: altered mental status, weakness  Patient Location and Admission Status: ED- Patient is not admitted  Family Members and Medical Staff Present During Exam: Wife, staff nurses  History of Present Illness: History provided by the wife and nursing staff at bedside.    He has a history of kidney transplant, dialysis, recent Cr 11. He was initially seen at this facility for filling up with fluids, diarrhea, vomiting last night.  He was transferred to another facility for management of his renal failure.  He was there all day and the patient and family felt that nothing was being done. His fistula isn't working so he needed a port for dialysis but they weren't doing this.  He signed out AMA.  His wife was taking him home and he seemed fine at 10:00PM.  Then around 11:30PM she was trying to help him get out to go to the bathroom but he could barely walk and was getting confused.  She called 911 and he was brought here.    He was seen recently for rectal  bleeding.  The patient is unable to provide any history at this time.  Number of Documented HPI Elements: 4+    **Medical History**  Medical History: ESRD  Other Medical History: Renal transplant, rectal bleeding    **Allergies**  Other Allergies: NSAIDs    **Medications**  Anti-Coagulants: None  Anti-Platelets: None  Other Medications: Outpatient Medications:  mycophenolate sodium (MYFORTIC) 180 mg DR tablet  cyclobenzaprine (FLEXERIL) 10 mg tablet  methylPREDNISolone (MEDROL, PAK,) 4 mg tablet  potassium chloride SA (KLOR-CON M20) 20 mEq tablet (Expired)  tacrolimus (PROGRAF) 1 mg  sulfamethoxazole-trimethoprim (BACTRIM) 400-80 mg per tablet  cholecalciferol, vitamin D3, (VITAMIN D3) Tab tablet  isosorbide mononitrate (IMDUR) 30 mg CR tablet  magnesium oxide (MAG-OX) 400 mg tablet  NIFEdipine (PROCARDIA) 10 mg capsule  tacrolimus (PROGRAF) 5 mg  predniSONE (DELTASONE) 10 mg tablet    **Social History**  Alcohol Use: None  Drug Use: None  Tobacco Use: None    **Vital Signs**  Temperature F: 98.7  Temperature C: 37.1  Blood Pressure (mmHg): 184/128  Heart Rate (bpm): 88  Respiration Rate (/min): 16  O2 Sat (%): 99  POC Glucose(mg/dL): 81  Date and Time: 41/32/4401 01:15:33 AM  ET    **Review Of Systems**  General, Constitutional: Unable to Obtain  General, Constitutional Comments: Patient vomiting, confused  Neurological: Unable to Obtain  Neurological comments: Patient vomiting, confused  Ophthalmology: Ardelia Mems to Obtain  Ophthalmology comments: Patient vomiting, confused    **  NIH Stroke Scale**  NIH Stroke Scale: Unable to Assess    **Exam**  Exam: Patient lying on stretcher  Initially not responding but then aroused and was following some commands.  Not oriented to location. Knew his wife at bedside.  Face appears symmetrical, looking around.    Able to raise and hold arms 5s symmetrically, able to lift right leg but drifted down to bed over 5s, difficulty holding up left leg.  Sensation intact to pain in right  leg.    Then began vomiting and was unable to be examined further.    **Labs and Imaging**  CT Brain Findings: No acute changes    **Assessment and Recommendations**  Assessment: 37 year old male with history of kidney transplant and now with apparent kidney failure, presents with altered mental status, vomiting and worsening labs.  He doesn't appear neurologically focal and I think stroke or primary CNS is unlikely.  I suspect this is related to metabolic processes and would not acute stroke interventions (thrombolytic therapy or IR).  CT head showed no CNS hemorrhage.  Would recommend addressing metabolic/medical issues and monitor neurological exam.  Recommendations: No acute neurological interventions at this time  Monitor and correct metabolic problems including acidosis, renal failure    Please call back neurology if new issues arise  Disposition: Disposition recommendations pending results of additional workup or management  Portions of the evaluation were not assessed due to the following:: Confused/Demented    **Diagnosis**  Impression: Other  Diagnosis Other: encephalopathy  Case discussed with: Case discussed with the patient, family and nursing staff on camera and with Dr. Pricilla Holm by phone after the video consultation was completed.  Please call back with any questions, concerns or if new issues arise.    **ICD-10 Code**  ICD-10 Code (Primary): G93.41 : Metabolic encephalopathy  ICD-10 Code: N18.6 : End stage renal disease  ICD-10 Code: I10 : Essential (primary) hypertension    **Attestation**  Interaction Mode: Video & Phone  Time of Phone Call : 09-29-2022 01:02 AM  ET  Time of Video Call : 09-29-2022 12:51 AM  ET  Interaction Attestation: Clinical telemedicine services delivered using HIPAA-compliant interactive video-audio telecommunications while the patient and the rendering provider were not in the same physical location. Written report was provided to the requesting provider.  Evaluation Duration  (mins): 39    **Key Timer Summary**  ED Arrival Date and Time: 09-29-2022 12:31 AM  ET  Date and Time of Request: 09-29-2022 12:44 AM  ET    **Physician Signature**  This document was electronically signed by: Marylynn Pearson MD      Electronically signed by Marylynn Pearson, MD at 09/29/2022  1:39 AM EDT

## 2022-09-29 NOTE — Nursing Note (Signed)
Patient admitted to MICU 14 from OSH. Patient arrived via stretcher, Safely transferred to ICU bed and placed on monitor.     Receiving RN, RRT, et MD at bedside.

## 2022-09-29 NOTE — Unmapped (Signed)
Formatting of this note might be different from the original.  Bedside report received from [place].Patient observations include  Call light within reach, bed in lowest position, bedside table within reach, nonskid socks in place, bed rails up X2, saftey scan complete. Patient or family has no complaints at this time.VSS    Electronically signed by Maryln Gottron, RN at 09/29/2022  7:35 PM EDT

## 2022-09-29 NOTE — Progress Notes (Signed)
Formatting of this note might be different from the original.  Patient taken by [facility] at this time. Patient on a Nitro gtt. Called UC updated given.   Electronically signed by Maryln Gottron, RN at 09/29/2022  7:52 PM EDT

## 2022-09-29 NOTE — Unmapped (Signed)
Formatting of this note might be different from the original.    THIS MEDICATION RECONCILIATION TECHNICIAN  HAS CLARIFIED THE PRIOR TO ADMISSION MEDICATION LIST AND IS READY FOR REVIEW.    PTA LIST #CHANGES: 6    CLARIFICATIONS: Med rec requested from pharmacist due to transplant status.  Updated prednisone dose to 10mg  daily  Myfortic dose is 3 tabs BID   Prograf doses are 5mg  in the AM and 6mg  at bedtime.  Procardia updated to 60mg  BID.    ALLERGY RECONCILIATION: No    ADDITIONS:    MARKED FOR REMOVAL:    Flexeril  Imdur  Mag-ox  Medrol dose pak    SOURCE OF INFORMATION:  Spouse Recall and Electronic Prescription Database; Medication bottles    Chantelle Hayes  (09/29/2022  2:51 PM)    Electronically signed by Jennings Books, PHARMD at 09/29/2022  4:20 PM EDT    Associated attestation - Jennings Books, PHARMD - 09/29/2022  4:20 PM EDT  Formatting of this note might be different from the original.  I agree with the content of this note and verified these changes match the prior to admission medication list.    Jennings Books, [facility]

## 2022-09-29 NOTE — Unmapped (Signed)
Formatting of this note might be different from the original.  Emeal Mailhot    Test Name: Creatinine  Results:  12.9  09/29/2022  Time: 0302  Received from: [facility]  R/V by: Ellard Artis RN    (Required: Attempt notification within 30 minutes or document reason for no notification)    Physician: Pricilla Holm DO  09/29/2022  Time: 0305  Notified: Yes - No orders received  Electronically signed by [place], RN at 09/29/2022  3:07 AM EDT

## 2022-09-29 NOTE — Discharge Summary (Signed)
Formatting of this note is different from the original.  Physician Discharge Summary    Patient ID:  MRN: [id number]  Name: Roberto Rice  Age: 37 y.o.  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  He was admitted to ICU and 4 hours HD was performed which he tolerated well.  He was hypertensive before dialysis and hydralazine IV was given without improvement. Post dialysis he remained hypertensive with systolic 178.  Nitro gtt was started.  This improved.      He was accepted for transfer at [facility] given we do not have transplant medicine available here.      Physical Exam on the Date of Discharge:  Physical exam:   General Appearance: alert, NAD   Neck: no JVD, no LN enlargement   Lungs: clear, no wheezing    Heart: RRR, S1 S2 heard   Abdomen / GI: soft, non-tender, no ascites   Extremities / musculoskeletal: no edema, no synovitis    Skin: no rash, dry skin   Neuro: follows commands, moves extremities     Consults:    IP CONSULT TO NEPHROLOGY    Significant Diagnostic Studies:        [facility]                              [address]                                [place], [place] [address]                                          Radiology    PATIENT NAME:  Roberto Rice, Roberto Rice                 MR#:  [id number]  PROCEDURE DATE:  09/29/2022                       ACCOUNT#:  [id number]  ACCESSION #:  [id number]                            ROOM#:  [id number]  ORDERING PHYS:  Zada Girt    PROCEDURE: XR PORTABLE CHEST    TECHNIQUE: One-view    CLINICAL INDICATION:  line placement    COMPARISON:  09/29/2022.    FINDINGS: There is cardiomegaly.  There is a left IJ line with tip projecting  over the distal SVC.  There is cardiomegaly with ground-glass congestion and  airspace opacities.    IMPRESSION:  1.  left IJ line has tip projecting over the distal SVC.  2.  Mild congestion and airspace opacities.                  THIS IS AN ELECTRONICALLY VERIFIED REPORT  09/29/2022 10:03 AM:  Vic Blackbird, MD      Vic Blackbird, MD      Disposition:  [place]     Discharge Condition:  fair    Discharge Medications and Orders:    Current Discharge Medication List       CONTINUE these medications which have NOT CHANGED    Details   mycophenolate sodium (MYFORTIC) 180 mg DR tablet Take 3 Tabs by mouth Twice a day for 90 days.  Qty: 540 Tablet, Refills: 0    Associated Diagnoses: Renal transplant recipient     !! tacrolimus (PROGRAF) 1 mg Take 6 mg by mouth At bedtime.     sulfamethoxazole-trimethoprim (BACTRIM) 400-80 mg per tablet Take 1 Tablet by mouth Monday, Wednesday and Friday.     cholecalciferol, vitamin D3, (VITAMIN D3) Tab tablet Take 2,000 Units by mouth Every morning.     NIFEdipine 60 mg ER tablet Take 60 mg by mouth Twice a day.     !! tacrolimus (PROGRAF) 5 mg Take 5 mg by mouth once daily with breakfast.     predniSONE (DELTASONE) 10 mg tablet Take 10 mg by mouth Once Daily.      !! - Potential duplicate medications found. Please discuss with provider.       STOP taking these medications      cyclobenzaprine (FLEXERIL) 10 mg tablet Comments:   Reason for Stopping:       isosorbide mononitrate (IMDUR) 30 mg CR tablet Comments:   Reason for Stopping:            No discharge procedures on file.    PCP:  No primary care provider on file.  No primary physician on file.  None    Future Appointments   Date Time Provider Department Center   10/04/2022  8:00 AM Glenna Durand, PT [facility] OP THERAPIES   10/30/2022 11:10 AM Brion Aliment, MD [facility] None   11/01/2022  2:15 PM Leigh Aurora, APRN [facility]      ----  Time spent on discharge process > 30 minutes     Signed:  Zada Girt, DO  09/29/2022  09/29/22   Electronically signed by Zada Girt, DO at 09/29/2022  4:59 PM EDT

## 2022-09-29 NOTE — Unmapped (Signed)
Formatting of this note might be different from the original.  Precert for IP admission    Inpatient admission date: 09/29/2022  DC date: 09/29/2022 pt was transferred to a higher level of care    ICD 10- N17.9    Contact:   [place] Boos, RN   [phone]   Fax [phone]   Electronically signed by Cristino Martes, RN at 10/01/2022  7:54 AM EDT

## 2022-09-29 NOTE — Unmapped (Signed)
Formatting of this note is different from the original.  [facility] Nurse Stroke Alert Progress Note    Name:Roberto Rice ZOX:[id number]   Admit Date:09/29/2022 12:31 AM  LOS: 0 days      Situation     Stroke alert initiated at 0044; [facility] nurse arrived at 0049 in ED bed 1.    Background     Called to assess 37 y.o. male with acute stroke symptoms. Patient presented from [name] [facility] via private vehicle, driven by girlfriend, when he became unresponsive. Girlfriend parked and called EMS. Stroke symptoms first noted at 2330. Patient was last known to be at their normal state 2200. Stroke symptoms include acute onset of:  altered LOC  generalized weakness  nausea/vomiting    Symptoms are improving since initial onset    Past Medical History   Past Medical History:   Diagnosis Date    Blood transfusion, without reported diagnosis     Hemodialysis     m-w-f  in [place]    Hyperlipidemia     Hypertension     Kidney disease approx 4 weeks    esrd dialysis m- w- f [place]     Assessment     NIH Stroke Scale - Total Score: 1    CTA head and neck was obtained per protocol. NIH 1 and last creatinine 11.3 last night.    WU:[id number] for hemorrhage    ADDENDUM   The findings were verbally communicated via telephone conference at 1:11 AM EDT   on 09/29/2022 with Sylvan Cheese.   The findings were acknowledged and understood.  THIS DOCUMENT HAS BEEN ELECTRONICALLY SIGNED BY Leotis Shames, MD on 09/29/2022 01:11   AM   Electronically Signed by: Leotis Shames, MD on 09/29/2022  1:12 AM         PROCEDURE INFORMATION:   Exam: CT Head Without Contrast   Exam date and time: 09/29/2022 12:39 AM   Age: 37 years old   Clinical indication: Stroke-like symptoms; Altered mental status/memory loss;   Additional info: AMS, unresponsive. No contrast given. Room 1/db   TECHNIQUE:   Imaging protocol: Computed tomography of the head without contrast.   Total images: 224   Radiation optimization: All CT scans at this facility use at least one of these   dose  optimization techniques: automated exposure control; mA and/or kV   adjustment per patient size (includes targeted exams where dose is matched to   clinical indication); or iterative reconstruction.   Other technique: STROKE PROTOCOL was implemented.   COMPARISON:   No relevant prior studies available.   FINDINGS:   Brain: Normal. No hemorrhage. Unremarkable white matter. No mass effect. The   gray-white interface is maintained.  Mega cisterna magna is a normal variant.  Cerebral ventricles: No ventriculomegaly.   Paranasal sinuses: Visualized sinuses are unremarkable. No fluid levels.   Mastoid air cells: Visualized mastoid air cells are well aerated.   Bones: Unremarkable. No acute fracture.   Soft tissues: Unremarkable.   IMPRESSION  IMPRESSION:   No acute intracranial abnormality.   ASSESSMENT:   ASPECTS ([place] Stroke Program Early CT Score) is 10.   THIS DOCUMENT HAS BEEN ELECTRONICALLY SIGNED BY Leotis Shames, MD ON 09/29/2022 12:59   AM    WGN:[id number]  Rhythm Interpretation: Normal sinus    HQI:[id number]    PROCEDURE INFORMATION:   Exam: XR Chest   Exam date and time: 09/29/2022 2:48 AM   Age: 37 years old   Clinical indication: Other: Dialysis cath placement;  Additional info:   Unresponsive   TECHNIQUE:   Imaging protocol: Radiologic exam of the chest.   Views: 1 view.   COMPARISON:   CR (CHEST, CXR AP X WISE) 09/28/2022 12:17 AM   FINDINGS:   Tubes, catheters and devices: Left-sided central venous catheter, distal tip   overlies the superior mediastinum.   Lungs: Unremarkable. No consolidation.   Pleural spaces: No pneumothorax noted.   Heart/Mediastinum: Unremarkable. No cardiomegaly.   Bones/joints: Unremarkable.   IMPRESSION  IMPRESSION:   Central venous catheter located on the left, position is indeterminate .   THIS DOCUMENT HAS BEEN ELECTRONICALLY SIGNED BY Bernarda Caffey, MD ON 09/29/2022 03:59   AM      Labs:    Procedure Component Value Ref Range Date/Time   Fingerstick Glucose [[id number]] (Abnormal)  Collected: 09/29/22 0404   Order Status: Completed Updated: 09/29/22 0407    GLUCOSE FINGERSTICK 50 Low Panic  70 - 110 mg/dL    Procalcitonin, QN, Kathie Rhodes [[id number]] Collected: 09/29/22 0040   Order Status: Completed Updated: 09/29/22 0402    PROCALCITONIN, QN, S 0.33 ng/mL     Comment: .          <0.05 ng/mL  Healthy individuals.          <0.50 ng/mL  Systemic infection (sepsis) is not likely.    0.50 - 2.00 ng/mL  Systemic infection (sepsis) is possible, but other                           conditions are known to induce PCT as well.   2.00 - 10.00 ng/mL  Systemic infection (sepsis) is likely, unless other                           causes are known.        >=10.00 ng/mL  Important systemic inflammatory response, almost                           exclusively due to severe bacterial sepsis or                          septic shock.      Narrative:     Called to and read back by LUKE J/ CREA EMRD, at 03:04 on 09/29/2022, TB3  Called to and read back by JOFFERY B/ EMRD BNP, at 02:26 on 09/29/2022, TB3   Comprehensive Metabolic Panel [[id number]] (Abnormal) Collected: 09/29/22 0040   Order Status: Completed Updated: 09/29/22 0304    SODIUM 138 135 - 145 mmol/L     POTASSIUM 5.1 High  3.6 - 5.0 mmol/L     CHLORIDE 106 101 - 111 mmol/L     CO2 15 Low  21 - 31 mmol/L     ANION GAP 17    GLUCOSE 81 70 - 110 mg/dL     CREATININE 87.5 High Panic  0.6 - 1.2 mg/dL     BUN 67 High  2 - 32 mg/dL     CALCIUM 8.9 8.5 - 64.3 mg/dL     PROTEIN TOTAL 7.7 6.1 - 7.8 g/dL     Albumin 3.8 3.2 - 5.0 g/dL     T BILIRUBIN 0.8 0.2 - 1.0 mg/dL     ALP 85 42 - 329 [iU]/L     AST 15 10 - 42 [iU]/L  ALT (SGPT) 13 10 - 60 [iU]/L     OSMOLALITY 294 266 - 309     A/G Ratio 1.0    B/C 5 Low  10 - 20     ESTIMATED GFR 4 mL/min     Comment:    *The estimated Glomerular Filtration Rate(EGFR) may not be      accurate for children under the age of 38 yrs.    **To estimate the GFR for African-Americans multiply the      result provided by 1.21.    Stage 1      90 mL/min or greater  Stage 2     60-89 mL/min  Stage 3     30-59 mL/min  Stage 4     15-29 mL/min  Stage 5     14 mL/min or less     Narrative:     Called to and read back by LUKE J/ CREA EMRD, at 03:04 on 09/29/2022, TB3  Called to and read back by JOFFERY B/ EMRD BNP, at 02:26 on 09/29/2022, TB3   Magnesium [[id number]] Collected: 09/29/22 0040   Order Status: Completed Updated: 09/29/22 0301    MAGNESIUM 2.1 1.7 - 2.8 mg/dL    Narrative:     Called to and read back by JOFFERY B/ EMRD BNP, at 02:26 on 09/29/2022, TB3   Fingerstick Glucose [[id number]] (Abnormal) Collected: 09/29/22 0242   Order Status: Completed Updated: 09/29/22 0244    GLUCOSE FINGERSTICK 129 High  70 - 110 mg/dL    Troponin I, HS, baseline [[id number]] Collected: 09/29/22 0040   Order Status: Completed Updated: 09/29/22 0230    TROPONIN I, HS, BASELINE 14 0 - 20     Comment:   For Males      20 ng/L is the AMI cutoff for males.    For Females      15 ng/L is the AMI cutoff for females.     Troponin I, HS, 1hr [[id number]] (Abnormal) Collected: 09/29/22 0135   Order Status: Completed Updated: 09/29/22 0229    TROPONIN I, HS 1HR 14 0 - 20 ng/L     Comment:   For Males      20 ng/L is the AMI cutoff for males.    For Females      15 ng/L is the AMI cutoff for females.      DELTA FROM BASELINE calc n/a Panic  %     Comment: Delta change from baseline troponin can help clinicians differentiate  between ischemic and non-ischemic events.  A delta change of 20% increase  from the baseline that becomes or is already in positive range (males > 20  ng/L or females > 15 ng/L) is considered clinically significant and should  be reported to the provider.     B-Type Natriuretic Peptide (Bnp) [[id number]] (Abnormal) Collected: 09/29/22 0040   Order Status: Completed Updated: 09/29/22 0227    BNP 481.0 High Panic  1.0 - 100.0 pg/mL     Comment: The BNP test should not be used as absolute evidence of CHF.  Elevated BNP blood concentrations may be found in  heart  attack patients and renal dialysis patients.     Lactic Acid, Venous [[id number]] Collected: 09/29/22 0040   Order Status: Completed Updated: 09/29/22 0157    LACTIC ACID 0.7 0.5 - 1.9 mmol/L    Phosphorus [[id number]] (Abnormal) Collected: 09/29/22 0040   Order Status: Completed Updated: 09/29/22 0112    PHOSPHORUS 5.8 High  2.5 - 4.6  mg/dL    CBC w/Differential [[id number]] (Abnormal) Collected: 09/29/22 0040   Order Status: Completed Updated: 09/29/22 0112    WBC 5.2 4.5 - 11.0 10*3/uL     RBC 3.61 Low  4.50 - 5.90 10*6/uL     HGB 11.4 Low  13.5 - 17.5 g/dL     HCT 40.9 Low  81.1 - 53.0 %     MCV 92.6 80.0 - 100.0 fL     MCHC 34.0 32.0 - 36.0 g/dL     MCH 91.4 78.2 - 95.6 pg     RDW 14.4 10.7 - 18.7 %     MPV 7.8 6.5 - 10.0 fL     Platelet Cnt 346 150 - 450 10*3/uL     Differential Type Auto    Neutrophils 79.0 High  35.0 - 66.0 %     Lymphocytes 16.4 Low  24.0 - 44.0 %     Monocytes 3.3 2.1 - 13.3 %     Eosinophils 0.1 Low  0.3 - 5.0 %     Basophils 1.2 High  0.0 - 1.0 %     Neutrophils Abs 4.1 1.5 - 8.5 10*3/uL     Lymphocytes Abs 0.8 Low  1.1 - 5.0 10*3/uL     Monocytes Abs 0.2 0.0 - 1.4 10*3/uL     Eosinophils Abs 0.0 0.0 - 0.5 10*3/uL     Basophils Abs 0.1 0.0 - 0.1 10*3/uL     MDW 20.7 High  0.0 - 20.0    PT/APTT/INR [[id number]] Collected: 09/29/22 0040   Order Status: Completed Updated: 09/29/22 0111    PROTIME 12.2 10.1 - 13.3 s     INR 1.0 0.9 - 1.1     Comment: LEVEL OF THERAPY           INDICATIONS         TARGET INR RANGE  STANDARD DOSE  TREATMENT OF VENOUS THROMBOSIS         2.0-3.0                 TREATMENT OF PULMONARY EMBOLUS                 PROPHYLAXIS AGAINST VENOUS THROMBOSIS                 BY SYSTEMIC EMBOLIZATION .    HIGH DOSE      HIGH RISK PATIENTS WITH                2.5-3.5                 MECHANICAL HEART VALVES      APTT 33.7 25.5 - 35.9 s    RT BGE Profile [[id number]] (Abnormal) Collected: 09/29/22 0058   Order Status: Completed Specimen: Artery Updated: 09/29/22 0103    PH BLOOD  7.35 7.35 - 7.45     PCO2 ARTERIAL 27 Low  35 - 45 mm[Hg]     PO2 99 80 - 100 mm[Hg]     HCO3 17.6 Low Panic  22.0 - 28.0 mmol/L     BASE EXCESS -9.4 mmol/L     %O2 SATURATION 99.4 95.0 - 100.0 %     FIO2 21.0 %     RESP NA 135.00 130.00 - 150.00 meq/L     RESP K 5.4 High  3.5 - 5.0 meq/L     RESP HEMOGLOBIN 10.8 Low  12.0 - 18.0     RESP IONIZED CALCIUM 1.18 1.10 - 1.35 meq/L     %  O2 HEMOGLOBIN 97.00 %     Comment:              NORMAL RANGE                PANIC VALUES          ARTERIAL    90-100 %        ARTERIAL    <90 %          VENOUS      60-80 %         VENOUS      <40 % TO>80 %      DRAW SITE Left Radial   Narrative:     Called to and read back by Dr Pricilla Holm, at 01:01 on 09/29/2022, TH1   RT Glucose [[id number]] Collected: 09/29/22 0058   Order Status: Completed Updated: 09/29/22 0103    RESP GLUCOSE 83.0 70.0 - 110.0 mg/dL    Narrative:     Called to and read back by Dr Pricilla Holm, at 01:01 on 09/29/2022, TH1   RT Lactic Acid [[id number]] Collected: 09/29/22 0058   Order Status: Completed Updated: 09/29/22 0103    RESP LACTIC ACID 0.6 0.5 - 2.2 mmol/L    Narrative:     Called to and read back by Dr Pricilla Holm, at 01:01 on 09/29/2022, TH1   Fingerstick Glucose [[id number]] Collected: 09/29/22 0041   Order Status: Completed Updated: 09/29/22 0044    GLUCOSE FINGERSTICK 81 70 - 110 mg/dL      Vital Signs:    Recorded Vitals    09/29/22 0104 09/29/22 0118 09/29/22 0130   BP: (!) 184/124 (!) 173/111 (!) 159/109   Pulse:  64 68   Resp: 18 12 16    Temp: 98.7 F (37.1 C)     TempSrc: Axillary     SpO2: 100% 100% 96%     Simple Water Test:- Swallowing Screen Performed?: Not Attempted, will keep NPO until screening or SLP eval completed (vomiting)    Recommendations:     IV t-PA Inclusion Criteria (must be YES in each category in order to give IV Thrombolytics):     yes - Patient is 76 years of age or older?  no - Clinical diagnosis of ischemic stroke with a measurable neurological deficit?  yes - Time last known well was at  2200.  This time is < 270 minutes.      Is patient a candidate for IV Thrombolytics? NO      Case discussed with Dr. Marylynn Pearson (neuro tele) and Dr. Pricilla Holm who made this decision.     IV Thrombolytics patient education sheet completed, signed by Dr. Pricilla Holm, and discussed with patient and family. Copy placed on chart.    Conclusion:     Per Dr. Yetta Barre, patient is NOT a candidate for IV Thrombolytics. NIH 1. Dr. Yetta Barre suspects metabolic cause of AMS and does NOT recommend CTA of head and neck. Hx of kidney transplant with elevated creatinine/hyperkalemia. Vomiting green emesis. Pt is not a candidate for endovascular intervention.     Patient disposition:  Admit for further workup.    Neurology Provider Arrival: [phone]  MER Decision: 0102  Thrombolytics Decision: 0102    Signed:  Ernie Hew, RN  09/29/2022  1:30 AM      Electronically signed by Ernie Hew, RN at 09/29/2022  4:25 AM EDT

## 2022-09-29 NOTE — Unmapped (Signed)
Formatting of this note might be different from the original.    Problem: Falls, High Risk For  Goal: Absence of falls  Description: Interventions:  - Non-skid footwear with ambulation  - Assist/supervise all transfers and ambulation  - Exercise program for strengthening  - Address p's during each patient encounter  - Remain with patient when on bsc or in bathroom  - Apply bed alarm system  - Chair/wheelchair locked  - Siderails up x 2  - Minimize line tethering  - Room near nurses' station  - Walkways free of clutter  - Night light on evening and night  - Room door open unless contraindicated by isolation  - Necessities within reach  - Appropriate fall bracelet on at all times  - Education, fall prevention  - Reorient patient to environment as appropriate  - Medication review  - Remind patient to call for assistance  -Bed in low position  -Bed wheels locked  Outcome: Ongoing    Problem: Discharge Planning  Goal: Knowledge of discharge instructions  Description: Interventions:  1. Consult social work for post discharge needs  2. Education, activity restrictions  3. Education, post discharge follow up  4. Education, prescribed medication  5. Education, when to call provider  6. Education, discharge diet  Outcome: Ongoing    Problem: Activity Intolerance  Goal: Improved activity tolerance  Description: Interventions:  1. Education, prescribed activity level  2. Progressive ambulation program  Outcome: Ongoing    Problem: Gas Exchange, Impaired  Goal: Pulse oximetry within specified parameters  Description: Interventions:  1. Pulse oximetry  2. Consult to respiratory therapy  3. Oxygen administration as ordered and PRN if SPO2 92% or less  4. Education incentive spirometry  5. Education, deep-breathing and coughing exercises  Outcome: Ongoing    Problem: Fluid Volume, Imbalanced, Risk for  Goal: Balanced intake and output  Description: Interventions:  1. Body weight monitoring  2. Intake and output  measurments  Outcome: Ongoing    Problem: Infection, Risk For, Surgical Site (General)  Goal: Absence of infection signs and symptoms  Description: Instructions:  - Wound assessment - surgical site  - Incision care  - Monitor lab results  - Education, hand washing  - Education, incision care  Outcome: Ongoing    Problem: Infection, Risk for  Goal: Patient will be free from infection  Description: Interventions:  1. Monitor for signs and symptoms of infection: fever, increased WBC, burning with urination or chills  2. Monitor insertion site for redness, tenderness, or drainage  3. Hand hygiene per CDC guidelines  4. Discontinuation of invasive devices when not needed  5. Place patient in appropriate isolation  Outcome: Ongoing    Problem: Nutrition Deficit  Goal: Adequate nutritional intake  Description: Interventions:  -Consult to dietician  -Intake and output measurement  -Calorie count if indicated  Outcome: Ongoing  Goal: Body weight within specified parameters  Description: Interventions:  - Body weight measurement  Outcome: Ongoing    Problem: Pain, Acute  Goal: Communication of presence of pain  Description: Interventions:  - Nonpharmacologic pain management  - Medication administration  - Education, pain scale  Outcome: Ongoing    Problem: Tissue Perfusion, Altered, Cardiopulmonary (General)  Goal: Absence of angina  Description: Interventions:  1. Oxygen administration  2. Altered tissue perfusion signs and symptoms assessment  Outcome: Ongoing  Goal: Cardiac rhythm stable  Description: Interventions:  1. Cardiac monitoring  2. Education, prescribed activity level  Outcome: Ongoing    Problem: Skin Integrity,  Impaired, Risk for  Goal: Absence of pressure injury  Description: Interventions:  - Skin assessment  - Patient repositioning every 2 hours if decreased mobility  Outcome: Ongoing    Problem: Deep Venous Thrombosis, Risk of  Goal: Absence of deep venous thrombosis  Description: Interventions:  - Deep  venous thrombosis risk assessment  - Ensure patient is receiving antithrombotic medication for VTE prophylaxis  - Activity promotion  - Graduated anti-embolic stocking management  - Intermittent pneumatic compression management.  Outcome: Ongoing  Goal: Knowledge of deep venous thrombosis  Description: Interventions:  - Education, deep venous thrombosis signs and symptoms  - Education, ambulation  - Education, graduated anti-embolic stockings  - Education, deep venous thrombosis prophalaxis  Outcome: Ongoing    Problem: Infection, Risk for, Urinary Catheter-Associated Urinary Tract Infection  Goal: Absence of urinary tract infection signs and symptoms  Description: Interventions:              -    Daily documentation of reason foley not removed (daily necessity)   -    Keep tamper evident seal intact, when possible   -    Secure device in place   -    Perform hand hygiene prior to contact with or manipulation of the catheter device   -    Perform daily meatal hygiene with soap and water   -    Empty drainage bag using clean graduated cylinder   -    Maintain unobstructed flow   -    Urinary catheter discontinuation as soon as possible utilizing the Nurse Driven Urinary Catheter Removal Protocol  Outcome: Ongoing    Problem: Tobacco Use  Goal: Inpatient tobacco-use cessation counseling participation  Description: Interventions:  - Tobacco use assessment  - Tobacco use cessation counseling  - Consult to lung health specialist  - Education, tobacco use cessation methods  Outcome: Ongoing      Electronically signed by Geralyn Flash, RN at 09/29/2022  7:10 AM EDT

## 2022-09-30 ENCOUNTER — Inpatient Hospital Stay
Admit: 2022-09-30 | Discharge: 2022-10-10 | Disposition: A | Discharge status: Home / Self Care | Payer: PRIVATE HEALTH INSURANCE | Source: Other Acute Inpatient Hospital

## 2022-09-30 ENCOUNTER — Inpatient Hospital Stay: Admit: 2022-09-30 | Payer: PRIVATE HEALTH INSURANCE

## 2022-09-30 LAB — CRYPTOCOCCUS ANTIGEN, SERUM: Crypto Ag, Ser: NEGATIVE

## 2022-09-30 LAB — VENOUS BLOOD GAS, LINE/SYRINGE
%HBO2-Line Draw: 88.9 % — ABNORMAL HIGH (ref 40.0–70.0)
Base Excess-Line Draw: -0.6 mmol/L (ref <=3.0)
CO2 Content-Line Draw: 25 mmol/L (ref 25–29)
Carboxyhgb-Line Draw: 1.5 % (ref 0.0–2.0)
HCO3-Line Draw: 24 mmol/L (ref 24–28)
Methemoglobin-Line Draw: 0.6 % (ref 0.0–1.5)
PCO2-Line Draw: 37 mmHg — ABNORMAL LOW (ref 41–51)
PH-Line Draw: 7.42 (ref 7.32–7.42)
PO2-Line Draw: 55 mmHg — ABNORMAL HIGH (ref 25–40)
Reduced Hemoglobin-Line Draw: 9 % — ABNORMAL HIGH (ref 0.0–5.0)

## 2022-09-30 LAB — CBC
Hematocrit: 30.9 % — ABNORMAL LOW (ref 38.5–50.0)
Hematocrit: 27.8 % — ABNORMAL LOW (ref 38.5–50.0)
Hemoglobin: 10.7 g/dL — ABNORMAL LOW (ref 13.2–17.1)
Hemoglobin: 9.9 g/dL — ABNORMAL LOW (ref 13.2–17.1)
MCH: 31.9 pg (ref 27.0–33.0)
MCH: 32.5 pg (ref 27.0–33.0)
MCHC: 34.6 g/dL (ref 32.0–36.0)
MCHC: 35.5 g/dL (ref 32.0–36.0)
MCV: 92.3 fL (ref 80.0–100.0)
MCV: 91.6 fL (ref 80.0–100.0)
MPV: 7.2 fL — ABNORMAL LOW (ref 7.5–11.5)
MPV: 7.1 fL — ABNORMAL LOW (ref 7.5–11.5)
Platelets: 320 10*3/uL (ref 140–400)
Platelets: 293 10*3/uL (ref 140–400)
RBC: 3.35 10*6/uL — ABNORMAL LOW (ref 4.20–5.80)
RBC: 3.03 10*6/uL — ABNORMAL LOW (ref 4.20–5.80)
RDW: 14.2 % (ref 11.0–15.0)
RDW: 14.2 % (ref 11.0–15.0)
WBC: 7.6 10*3/uL (ref 3.8–10.8)
WBC: 7.5 10*3/uL (ref 3.8–10.8)

## 2022-09-30 LAB — RENAL FUNCTION PANEL W/EGFR
Albumin: 3.6 g/dL (ref 3.5–5.7)
Albumin: 3.5 g/dL (ref 3.5–5.7)
Anion Gap: 14 mmol/L (ref 3–16)
Anion Gap: 13 mmol/L (ref 3–16)
BUN: 52 mg/dL — ABNORMAL HIGH (ref 7–25)
BUN: 52 mg/dL — ABNORMAL HIGH (ref 7–25)
CO2: 21 mmol/L (ref 21–33)
CO2: 21 mmol/L (ref 21–33)
Calcium: 9.3 mg/dL (ref 8.6–10.3)
Calcium: 9.1 mg/dL (ref 8.6–10.3)
Chloride: 101 mmol/L (ref 98–110)
Chloride: 102 mmol/L (ref 98–110)
Creatinine: 11.04 mg/dL — ABNORMAL HIGH (ref 0.60–1.30)
Creatinine: 11.81 mg/dL — ABNORMAL HIGH (ref 0.60–1.30)
EGFR: 6
EGFR: 5
Glucose: 105 mg/dL — ABNORMAL HIGH (ref 70–100)
Glucose: 89 mg/dL (ref 70–100)
Osmolality, Calculated: 296 mOsm/kg (ref 278–305)
Osmolality, Calculated: 296 mOsm/kg (ref 278–305)
Phosphorus: 5.6 mg/dL — ABNORMAL HIGH (ref 2.1–4.7)
Phosphorus: 5.7 mg/dL — ABNORMAL HIGH (ref 2.1–4.7)
Potassium: 4 mmol/L (ref 3.5–5.3)
Potassium: 4 mmol/L (ref 3.5–5.3)
Sodium: 136 mmol/L (ref 133–146)
Sodium: 136 mmol/L (ref 133–146)

## 2022-09-30 LAB — URINALYSIS W/RFL TO MICROSCOPIC
Bilirubin, UA: NEGATIVE
Ketones, UA: NEGATIVE mg/dL
Leukocyte Esterase, UA: NEGATIVE
Nitrite, UA: NEGATIVE
Protein, UA: 100 mg/dL — AB
RBC, UA: 3 /HPF (ref 0–3)
Specific Gravity, UA: 1.01 (ref 1.005–1.035)
Squam Epithel, UA: <1 /HPF (ref 0–5)
Urobilinogen, UA: <2 mg/dL (ref 0.2–1.9)
WBC, UA: 4 /HPF (ref 0–5)
pH, UA: 7.5 (ref 5.0–8.0)

## 2022-09-30 LAB — PROTIME-INR
INR: 1.1 (ref 0.9–1.1)
Protime: 15 seconds (ref 12.1–15.1)

## 2022-09-30 LAB — EPSTEIN BARR VIRUS DNA, QNT PCR, BLOOD: EBV DNA, Quantitative PCR: <35 IU/mL

## 2022-09-30 LAB — BK VIRUS QUANTITATIVE BY PCR, BLOOD: BKV IU DNA Quant, Blood: NOT DETECTED IU/mL

## 2022-09-30 LAB — ENTERIC PATHOGEN PANEL
Campylobacter Group (C. ecoli, C. jejuni, C. lari): NOT DETECTED
Norovirus: NOT DETECTED
Rotavirus: NOT DETECTED
Salmonella species: NOT DETECTED
Shiga toxin 1: NOT DETECTED
Shiga toxin 2: NOT DETECTED
Shigella species: NOT DETECTED
Vibrio Group (Vibrio cholerae, Vibrio parahaemolyticus): NOT DETECTED
Yersinia enterocolitica: NOT DETECTED

## 2022-09-30 LAB — CMV IGM ANTIBODY
CMV IGM NUM: <8 AU/mL (ref 0.00–29.99)
CMV IgM: NEGATIVE

## 2022-09-30 LAB — HEPATIC FUNCTION PANEL
ALT: 8 U/L (ref 7–52)
AST: 13 U/L (ref 13–39)
Albumin: 3.6 g/dL (ref 3.5–5.7)
Alkaline Phosphatase: 76 U/L (ref 36–125)
Bilirubin, Direct: 0.13 mg/dL (ref 0.00–0.40)
Bilirubin, Indirect: 0.57 mg/dL (ref 0.00–1.10)
Total Bilirubin: 0.7 mg/dL (ref 0.0–1.5)
Total Protein: 7.6 g/dL (ref 6.4–8.9)

## 2022-09-30 LAB — GIARDIA CRYPTOSPORIDIUM ANTIGENS
Cryptosporidium Ag: NEGATIVE
Giardia Ag: NEGATIVE

## 2022-09-30 LAB — B NATRIURETIC PEPTIDE: BNP: 371 pg/mL — ABNORMAL HIGH (ref 0–100)

## 2022-09-30 LAB — OVA AND PARASITE COMPREHENSIVE W/ GIARDIA/CRYPTO

## 2022-09-30 LAB — DONOR SPECIFIC ANTIBODY (DSA)

## 2022-09-30 LAB — CREATININE, URINE, RANDOM: Creatinine, Urine: 83.5 mg/dL

## 2022-09-30 LAB — BLOOD CULTURE-PERIPHERAL
Culture Result: NO GROWTH
Culture Result: NO GROWTH

## 2022-09-30 LAB — MAGNESIUM
Magnesium: 1.9 mg/dL (ref 1.5–2.5)
Magnesium: 1.9 mg/dL (ref 1.5–2.5)

## 2022-09-30 LAB — UREA NITROGEN, URINE: Urea Nitrogen, Ur: 212 mg/dL

## 2022-09-30 LAB — CYTOMEGALOVIRUS DNA, QUANT, RT PCR: CMV DNA Qnt: NOT DETECTED IU/mL

## 2022-09-30 LAB — ANTIBODY SCREEN: Antibody Screen: NEGATIVE

## 2022-09-30 LAB — SODIUM, URINE, RANDOM: Sodium, Ur: 64 mmol/L

## 2022-09-30 LAB — TACROLIMUS LEVEL: Tacrolimus (LC-MS): 2.4 ng/mL — ABNORMAL LOW (ref 3.0–15.0)

## 2022-09-30 LAB — ABO/RH: Rh Type: POSITIVE

## 2022-09-30 LAB — LACTIC ACID: Lactate: 0.8 mmol/L (ref 0.5–2.2)

## 2022-09-30 LAB — CMV IGG ANTIBODY
CMV IGG NUM: >10 U/mL — ABNORMAL HIGH (ref 0.00–0.59)
CMV IgG: POSITIVE — AB

## 2022-09-30 LAB — STRONGYLOIDES AB: Strongyloides Ab: NEGATIVE

## 2022-09-30 MED ORDER — predniSONE (DELTASONE) tablet 5 mg
5 | Freq: Every day | ORAL | Status: AC
Start: 2022-09-30 — End: 2022-10-02
  Administered 2022-10-01 – 2022-10-02 (×2): 5 mg via ORAL

## 2022-09-30 MED ORDER — NIFEdipine (PROCARDIA-XL) 24 hr tablet 60 mg
60 | Freq: Two times a day (BID) | ORAL | Status: AC
Start: 2022-09-30 — End: 2022-10-10
  Administered 2022-09-30 – 2022-10-02 (×6): 60 mg via ORAL
  Administered 2022-10-03: 14:00:00 via ORAL
  Administered 2022-10-03: 60 mg via ORAL
  Administered 2022-10-04 – 2022-10-10 (×13): via ORAL

## 2022-09-30 MED ORDER — atovaquone (MEPRON) suspension 1,500 mg
750 | Freq: Every day | ORAL | Status: AC
Start: 2022-09-30 — End: 2022-10-10
  Administered 2022-09-30: 21:00:00 1500 mg via ORAL
  Administered 2022-10-04 – 2022-10-09 (×4): via ORAL

## 2022-09-30 MED ORDER — simethicone (MYLICON) chewable tablet 80 mg
80 | Freq: After meals | ORAL | Status: AC
Start: 2022-09-30 — End: 2022-10-04
  Administered 2022-09-30 – 2022-10-01 (×2): 80 mg via ORAL

## 2022-09-30 MED ORDER — hydrALAZINE (APRESOLINE) 20 mg/mL injection 10 mg
20 | Freq: Three times a day (TID) | INTRAMUSCULAR | PRN
Start: 2022-09-30 — End: 2022-10-07

## 2022-09-30 MED ORDER — hydrALAZINE (APRESOLINE) tablet 10 mg
10 | Freq: Every day | ORAL | PRN
Start: 2022-09-30 — End: 2022-09-30

## 2022-09-30 MED ORDER — tacrolimus (PROGRAF) capsule 6 mg
1 | Freq: Every evening | ORAL | Status: AC
Start: 2022-09-30 — End: 2022-10-10
  Administered 2022-10-01 – 2022-10-03 (×3): 6 mg via ORAL
  Administered 2022-10-04 – 2022-10-10 (×7): via ORAL

## 2022-09-30 MED ORDER — tacrolimus (PROGRAF) capsule 5 mg
5 | Freq: Every day | ORAL | Status: AC
Start: 2022-09-30 — End: 2022-10-10
  Administered 2022-09-30 – 2022-10-02 (×3): 5 mg via ORAL
  Administered 2022-10-03 – 2022-10-09 (×6): via ORAL

## 2022-09-30 MED ORDER — midazolam (PF) (VERSED) injection 2 mg
1 | Freq: Once | INTRAMUSCULAR
Start: 2022-09-30 — End: 2022-09-30

## 2022-09-30 MED ORDER — predniSONE (DELTASONE) tablet 10 mg
5 | Freq: Every day | ORAL
Start: 2022-09-30 — End: 2022-09-30
  Administered 2022-09-30: 12:00:00 10 mg via ORAL

## 2022-09-30 MED ORDER — famotidine (PEPCID) tablet 20 mg
20 | Freq: Every day | ORAL | Status: AC
Start: 2022-09-30 — End: 2022-10-10

## 2022-09-30 MED ORDER — mycophenolate (MYFORTIC) EC tablet 540 mg
180 | Freq: Two times a day (BID) | ORAL | Status: AC
Start: 2022-09-30 — End: 2022-10-02
  Administered 2022-09-30 – 2022-10-02 (×4): 540 mg via ORAL

## 2022-09-30 MED ORDER — mycophenolate (CELLCEPT) capsule 500 mg
250 | Freq: Two times a day (BID) | ORAL
Start: 2022-09-30 — End: 2022-09-30

## 2022-09-30 MED FILL — MYFORTIC 180 MG TABLET,DELAYED RELEASE: 180 180 mg | ORAL | Qty: 3

## 2022-09-30 MED FILL — PREDNISONE 5 MG TABLET: 5 5 MG | ORAL | Qty: 2

## 2022-09-30 MED FILL — NIFEDIPINE ER 60 MG TABLET,EXTENDED RELEASE 24 HR: 60 60 MG (OSM) | ORAL | Qty: 1

## 2022-09-30 MED FILL — TACROLIMUS 1 MG CAPSULE, IMMEDIATE-RELEASE: 1 1 MG | ORAL | Qty: 1

## 2022-09-30 MED FILL — TACROLIMUS 5 MG CAPSULE, IMMEDIATE-RELEASE: 5 5 MG | ORAL | Qty: 1

## 2022-09-30 MED FILL — FAMOTIDINE 20 MG TABLET: 20 20 MG | ORAL | Qty: 1

## 2022-09-30 MED FILL — SIMETHICONE 80 MG CHEWABLE TABLET: 80 80 MG | ORAL | Qty: 1

## 2022-09-30 MED FILL — HEPARIN (PORCINE) 5,000 UNIT/ML INJECTION SOLUTION: 5000 5,000 unit/mL | INTRAMUSCULAR | Qty: 1

## 2022-09-30 MED FILL — ICU ELECTROLYTE REPLACEMENT PROTOCOL: Qty: 1

## 2022-09-30 MED FILL — MEPRON 750 MG/5 ML ORAL SUSPENSION: 750 750 mg/5 mL | ORAL | Qty: 10

## 2022-09-30 NOTE — H&P (Incomplete)
Department of Internal Medicine  MICU History and Physical    Patient: Roberto Rice  MRN: [id number]  Room: MI14/UMICU-14    Chief Complaint: ***    HPI     Roberto Rice is a 37 y.o. male with a PMH significant for kidney transplant in 2018 and hypertension  who presented to the hospital on 09/29/2022 for ***.       ***    K 3.5 -> 5.1 -> 4.2, Phos 5.4 -> 5.8. ABG 7.35/27/99 Cr 11.3 -> 12.9 -> 12.3, BUN 64 -> 67 -> 71, HCO3 19 -> 15 -> 16. Renal ultrasound at [facility] [place] on 09/28/22 with no hydronephrosis. With worsening renal function and electrolyte derangements, started CRRT. For elevated blood pressure, received IV hydralazine and labetalol and then started on nifedipine gtt    ROS     ROS    Past Medical, Surgical, & Social History     No past medical history on file.    No past surgical history on file.  Surgical history: AV fistula 2013, tunnel dialysis placement 2013, hx of broken bon***    No family history on file.  - FHx reviewed, non-contributory other than listed above.      Social History     Socioeconomic History    Marital status: Not on file     Spouse name: Not on file    Number of children: Not on file    Years of education: Not on file    Highest education level: Not on file   Occupational History    Not on file   Tobacco Use    Smoking status: Never    Smokeless tobacco: Never   Substance and Sexual Activity    Alcohol use: Not on file    Drug use: Not on file    Sexual activity: Not on file   Other Topics Concern    Not on file   Social History Narrative    Not on file     Social Determinants of Health     Financial Resource Strain: Not on file   Food Insecurity: No Food Insecurity (09/29/2022)    Hunger Vital Sign     Worried About Running Out of Food in the Last Year: Never true     Ran Out of Food in the Last Year: Never true   Transportation Needs: No Transportation Needs (09/29/2022)    PRAPARE - Therapist, art (Medical): No     Lack of  Transportation (Non-Medical): No   Physical Activity: Not on file   Stress: Not on file   Social Connections: Not on file   Intimate Partner Violence: Not At Risk (09/29/2022)    Humiliation, Afraid, Rape, and Kick questionnaire     Fear of Current or Ex-Partner: No     Emotionally Abused: No     Physically Abused: No     Sexually Abused: No   Housing Stability: Low Risk  (09/29/2022)    Housing Stability Vital Sign     Unable to Pay for Housing in the Last Year: No     Number of Times Moved in the Last Year: 0     Homeless in the Last Year: No       Allergies & Home Medications     Allergies:  No Known Allergies    Home Meds:  No medications prior to admission.       Physical Exam     Temp:  [  99 F (37.2 C)] 99 F (37.2 C)  Heart Rate:  [88-94] 94  Resp:  [14-18] 14  BP: (157-163)/(106-109) 157/106    GENERAL: Alert and cooperative. No acute distress. ***  EYES: PERRL. EOM intact. No scleral icterus.   ENT: Neck supple. Trachea midline. No obvious masses or deformities. No cervical lymphadenopathy.  HEART: Regular rate and rhythm. S1, S2 intact. No murmurs, rubs, gallops. No peripheral edema.  LUNGS: Clear to auscultation bilaterally. Normal work of breathing.  ABDOMEN: Soft, non-tender, non-distended. Bowel sounds intact. No rebound or guarding.  MSK: No joint swelling. No muscle tenderness.  SKIN: No rashes. No ecchymoses.  NEURO: Oriented to person, place, and time. Speech normal. No facial asymmetry.  PSYCH: Normal mood. Normal behavior.    Intake/Output Summary (Last 24 hours) at 09/30/2022 0007  Last data filed at 09/30/2022 0004  Gross per 24 hour   Intake --   Output 175 ml   Net -175 ml            Admit Wt: Weight: 204 lb 5.9 oz (92.7 kg)     Diagnostic Data     All laboratory results were reviewed. Relevant results as noted here:  ***    ECG: ***    Imaging:  ***    Assessment & Plan     Kishawn Hutcherson is a 37 y.o. male with <principal problem not specified>. Medical problems being addressed in this  encounter include the following:    Neurology, Pain, Sedation:   - No acute issues.     Pulmonary:   - No acute issues. Will encourage incentive spirometry use.     Cardiovascular:    - No acute issues. Will continue to monitor on telemetry.     Gastrointestinal:  - No acute issues. Continue bowel regimen.  - Enteric access via ***.    Renal (Electrolytes, Acid/Base):  - No acute issues.   - Monitor I/Os, renal panels, replete lytes PRN.     Genitourinary:  - No acute issues.   - Foley in place draining clear yellow urine***.    Infectious Disease:   - No acute issues.    Endocrine:  - No acute issues. Goal BG <180.     Hematology/Oncology:  - No acute issues.    Rheumatology:  - No acute issues.     Musculoskeletal:  - No acute issues.     Psychiatry:  - No acute issues.     F/E/N:  - Replete electrolytes PRN.   - SLP eval: consulted***.   - Nutrition eval: consulted***.     Diet/Nutrition Orders    Diet renal     Frequency: Effective Now     Number of Occurrences: Until Specified     Order Questions:      Suicide/Behavior Risk Modification? No       Family discussion: ongoing***.   Disposition: remain in MICU***.      DVT/Anticoagulation: {{DVT PROPHYLAXIS ZOXW:96045}  GI Ulcer Prophylaxis: {GI ULCER PROPHYLAXIS WUJW:11914}  Nutrition: {NUTRITION MICU:29495}  LDA:   Patient Lines/Drains/Airways Status       Active Line / PIV Line       Name Placement date Placement time Site Days    HD Catheter Dual Lumen (Vas Cath) Non-tunneled Left Internal Jugular 09/29/22  2341  Internal Jugular  less than 1    Peripheral IV Left;Posterior Hand --  --  Hand  --    Peripheral IV 09/29/22 Anterior;Left;Proximal Forearm 09/29/22  2340  Forearm  less than 1                  Invasive Lines: {INVASIVE LINES MICU:29490}  Foley Cath: {FOLEY ZOXW:96045}  Bowel Integrity: {BOWEL INTEGRITY MICU:29491}  Rectal Tube: {RECTAL TUBE MICU:29492}  Code Status: Full Code  Next of kin / POA update: {NEXT OF KIN WUJW:11914}        Patrice Moates  MICU Resident  @DATE @ 12:07 AM

## 2022-09-30 NOTE — Progress Notes (Signed)
MICU Attending Admit Note    I independently saw and examined this patient on 09/30/2022.  The x-rays and labs were reviewed.  The case was discussed in detail with the MICU house staff and a plan for medical care arranged.    This is a 37 yo male who presented in transfer from an OSH on 09/30/2022 due to an AKI in the setting of a prior renal transplant.   He is noted to have developed non bloody diarrhea and decreased urination over approx a 1 week timeframe.  Was admitted to the OSH with hypertensive urgency where a NTG gtt and urgent dialysis was preformed.      His hx is further notable for acquiring Norovirus in May, 2024 during which Tacro was held.      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   --HCV    Allergies  No Known Allergies    BP (!) 146/95   Pulse 92   Temp 99.1 F (37.3 C) (Oral)   Resp 15   Ht 5' 11 (1.803 m)   Wt 204 lb 5.9 oz (92.7 kg)   SpO2 98%   BMI 28.50 kg/m     HEENT - PERRL, conjugate gaze.   CV - RRR   Chest/Lungs - Bilateral symmetrical respiratory excursions noted. RA O2  Abd - soft without tenderness or guarding  GU - foley cath in place   Ext - no deformity or edema.  Neuro - Awake, alert and appropriately responsive    Labs/Xrays reviewed.    Assessment and Plan  --Hypertensive Urgency  DC NTG gtt  Resume Nifedipine XL 60 mg BID  --Acute kidney injury s/p transplant  - question related to ACR vs recent diarrheal illness  Consult transplant Nephrology  Follow UO/Creat  Adjust meds for reduced GFR  Consult Transplant ID  Pending labs  CMV Quant PCR  EBV Quant PCR  C Diff studies  Enteric pathogens  --Hx of Renal Transplant (2018) complicated by prior episodes of rejection.   Tacrolimus 5 mg in am and 6 mg pm  MMF 540 mg daily  Prednisone5 mg daily  Daily Tacro levels  --Diarrhea with recent hx of Norovirus - diarrhea appears to have stopped  --Normocytic  Anemia    Stable to transfer out of MICU    ICU Checklist    DVT/Anticoagulation: {SQ UFH  GI Ulcer Prophylaxis: H2 Blocker - preferred  Nutrition: PO  LDA:   Patient Lines/Drains/Airways Status       Active Line / PIV Line       Name Placement date Placement time Site Days    HD Catheter Dual Lumen (Vas Cath) Non-tunneled Left Internal Jugular 09/29/22  2341  Internal Jugular  less than 1    Peripheral IV Left;Posterior Hand --  --  Hand  --    Peripheral IV 09/29/22 Anterior;Left;Proximal Forearm 09/29/22  2340  Forearm  less than 1                  Invasive Lines: Continue current access  Foley Cath: None  Bowel Integrity: PEG/Laxatives ordered  Rectal Tube: None  Code Status: Full Code  Next of kin / POA update: Patient/NOK/POA updated at bedside        I spent a total of 40 minutes of critical care time caring for this patient with Hypertensive urgency, AKI, including  direct patient contact, management of life support systems review of data (i.e.: imaging and lab), discussion with team members and this time excludes time spent on procedures    [employer] Keri Veale, DO  6:25 AM, 09/30/2022

## 2022-09-30 NOTE — H&P (Signed)
Department of Internal Medicine  MICU History and Physical    Patient: Roberto Rice  MRN: [id number]  Room: MI14/UMICU-14    Chief Complaint: AKI    HPI     Roberto Rice is a 37 y.o. male with a PMH significant for kidney transplant in 2018 and hypertension  who presented as transfer from OSH on 09/29/2022 for AKI.     He started having diarrhea 5 days ago along with decreased urination (non-bloody) with weight up to 215 lbs from baseline 200-205lbs. Diarrhea characterized as watery, non-bloody reportedly output equivalent to input. He reported decreased PO intake given concern for volume overload and not being able to clear toxins. [facility] went [facility] but transferred to [facility] [place]. He felt he was not being cared for appropriately there and left AMA. When driving leaving [name] patient developed altered mental status, so they returned [facility]. There CTH stroke protocol was completed, unremarkable though. K 3.5 -> 5.1 -> 4.2, Phos 5.4 -> 5.8. ABG 7.35/27/99 Cr 11.3 -> 12.9 -> 12.3, BUN 64 -> 67 -> 71, HCO3 19 -> 15 -> 16. Renal ultrasound at [facility] [place] on 09/28/22 with no hydronephrosis. With worsening renal function and electrolyte derangements, started CRRT. For elevated blood pressure, received IV hydralazine and labetalol and then started on nitro gtt. At second [facility] visit, vas cath placed and dialysis completed urination improved and had 9 lb drop in weight.     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    He denied fever, chills, blurry vision, chest pain, shortness of breath. He endorse headache, but very recently turned off nitro gtt.     ROS     Negative unless stated otherwise above.    Past Medical, Surgical, & Social History     No past medical history on file.    No past surgical history on  file.  Surgical history: AV fistula 2013, kidney transplant in 2018, hx of L ankle surgery with 10 pins and plate at age of 85    No family history on file.  - FHx non-contributory       Social History     Socioeconomic History    Marital status: Not on file     Spouse name: Not on file    Number of children: Not on file    Years of education: Not on file    Highest education level: Not on file   Occupational History    Not on file   Tobacco Use    Smoking status: Never    Smokeless tobacco: Never   Substance and Sexual Activity    Alcohol use: Not on file    Drug use: Not on file    Sexual activity: Not on file   Other Topics Concern    Not on file   Social History Narrative    Not on file     Social Determinants of Health     Financial Resource Strain: Not on file   Food Insecurity: No Food Insecurity (09/29/2022)    Hunger Vital Sign     Worried About Running Out of Food in the Last Year: Never true     Ran Out of Food in the Last Year: Never true   Transportation Needs: No Transportation Needs (09/29/2022)    PRAPARE - Transportation  Lack of Transportation (Medical): No     Lack of Transportation (Non-Medical): No   Physical Activity: Not on file   Stress: Not on file   Social Connections: Not on file   Intimate Partner Violence: Not At Risk (09/29/2022)    Humiliation, Afraid, Rape, and Kick questionnaire     Fear of Current or Ex-Partner: No     Emotionally Abused: No     Physically Abused: No     Sexually Abused: No   Housing Stability: Low Risk  (09/29/2022)    Housing Stability Vital Sign     Unable to Pay for Housing in the Last Year: No     Number of Times Moved in the Last Year: 0     Homeless in the Last Year: No       Allergies & Home Medications     Allergies:  No Known Allergies    Home Meds:  No medications prior to admission.       Physical Exam     Temp:  [99 F (37.2 C)] 99 F (37.2 C)  Heart Rate:  [88-106] 106  Resp:  [13-18] 13  BP: (157-165)/(99-114) 158/99    GENERAL: Alert and cooperative. No  acute distress.   HEENT: normocephalic and atraumatic, L IJ Vas Cath in place, conjugate gaze   HEART: Regular rate and rhythm.   LUNGS: Clear to auscultation bilaterally. Normal work of breathing.  ABDOMEN: Soft, non-tender, non-distended. Transplant kidney palpated in RLQ  EXT: no lower extremity edema  NEURO: Alert, Oriented to person, place, and time. Speech normal. No facial asymmetry. Strength symmetric in upper and lower extremities  PSYCH: Normal mood. Normal behavior.    Intake/Output Summary (Last 24 hours) at 09/30/2022 0424  Last data filed at 09/30/2022 0004  Gross per 24 hour   Intake --   Output 175 ml   Net -175 ml            Admit Wt: Weight: 204 lb 5.9 oz (92.7 kg)     Diagnostic Data     All laboratory results were reviewed from OSH. Relevant results as noted below      Imaging:  OSH reports reviewed    Assessment & Plan     [name] is a 37 y.o. male with <principal problem not specified>. Medical problems being addressed in this encounter include the following:    Neurology, Pain, Sedation:     #Altered Mental status (resolved)  Secondary to  metabolic acidosis vs uremia. Stroke protocol CTH negative at OSH. At baseline on presentation at [facility]    Pulmonary:   - No acute issues. Will encourage incentive spirometry use.     Cardiovascular:    #Hypertensive urgency  Placed on nitro gtt at OSH for elevated blood pressures. On arrival Sbp 150-160 off gtt  -continue home nifedipine ER 60 mg BID with now dose  -PRN hydralazine 10 mg PO for SBP > 170, consider labetalol if also tachycardic    Gastrointestinal:    #diarrhea  history of C. Diff colonization, could be secondary to opportunistic infections  -c. Diff testing  -opportunistic infection testing including cryptococcus antigen serum, CMV PCR, IgM, IgG  -enteric pathogen panel    Renal (Electrolytes, Acid/Base):      #AKI  Creatinine elevated to Cr 11.3 -> 12.9 -> 12.3 at outside hospital. Baseline 2.4-2.7 per OSH records. Could be prerenal  secondary to decreased PO intake/diarrhea vs acute rejection vs medication induced 2/2 bactrim. Less likely  obstructing stone given OSH records detailing no hydronephrosis  -follow up creatinine with admission renal panel s/p dialysis at OSH  -urine lytes  -urinalysis   -consider repeat renal biopsy  -obtain OSH renal ultrasound       #History of kidney transplant in 2018  Per patient, in May 2024 thought to have acute rejection given similar presentation of symptoms. At home takes, tacrolimus 5 mg with breakfast daily and 6mg  at bedtime, mycophenolate 540 mg PO BID  -transplant renal consult  -obtain tacro level  -hold tacro and mycophenolate pending discussion with transplant renal   -prednisone 10 mg daily  -hold Bactrim in setting of AKI    #Metabolic acidosis  HCO3 19 -> 15 -> 16 at outside. Most likely secondary to AKI.  -will repeat VBG s/p dialysis at OSH    #hyperphosphatemia  Phos 5.4 -> 5.8. Will obtain repeat phosphrous given dialysis session today.   -consider phosphate binders as indicated    Genitourinary:  - No acute issues.     Infectious Disease:   - No acute issues.    Endocrine:  - No acute issues. Goal BG <180.     Hematology/Oncology:  - No acute issues.    Rheumatology:  - No acute issues.     Musculoskeletal:  - No acute issues.     Psychiatry:  - No acute issues.     F/E/N:  - Replete electrolytes PRN.   - SLP eval: not consulted.   - Nutrition eval: not consulted.     #Med Rec  Complete formal medication reconciliation once wife at bedside as patient detailed wife knows medications bette    Diet/Nutrition Orders    Diet renal     Frequency: Effective Now     Number of Occurrences: Until Specified     Order Questions:      Suicide/Behavior Risk Modification? No       Family discussion: ongoing.   Disposition: remain in MICU.      DVT/Anticoagulation: {SQ UFH  GI Ulcer Prophylaxis: H2 Blocker - preferred  Nutrition: PO  LDA:   Patient Lines/Drains/Airways Status       Active Line / PIV Line        Name Placement date Placement time Site Days    HD Catheter Dual Lumen (Vas Cath) Non-tunneled Left Internal Jugular 09/29/22  2341  Internal Jugular  less than 1    Peripheral IV Left;Posterior Hand --  --  Hand  --    Peripheral IV 09/29/22 Anterior;Left;Proximal Forearm 09/29/22  2340  Forearm  less than 1                  Invasive Lines: Continue current access  Foley Cath: None  Bowel Integrity: PEG/Laxatives held  Rectal Tube: None  Code Status: Full Code  Next of kin / POA update: To be updated by house staff        Ephriam Jenkins  MICU Resident  4:24 AM

## 2022-09-30 NOTE — Plan of Care (Signed)
FALLS  Pt oriented to room, fall bracelet in place, call light and personal items within reach. Bed in lowest and locked position, 3/4 siderails up. Pt reminded to call for assistance. Frequent rounding performed.

## 2022-09-30 NOTE — Consults (Signed)
Department of Infectious Diseases  Consult Note  09/30/2022     Referring Physician: Carlena Hurl Ramser, DO   Patient's Name: Roberto Rice  MRN: [id number]  CSN: [id number]    Reason for Consult      Antimicrobial Recommendations    Assessment & Recommendations     1.) Diarrhea  - Roberto Rice presents with 4-5 days of reported diarrhea which has now had some interval improvement   - Will follow up pending infectious workup as outlined below   - Recommend addition of cyclosproa smear, enteric pathogen panel, and strongy serology   - Recommend monitoring clinically off of antibiotics for now    2.) History of DDKT (2018)  3.) AKI  - Will follow up BKV  - Recommend sending Adenovirus PCR    ID will follow. Please reach out with any questions or concerns.    Recommendations as listed above are not finalized until attested by attending physician.    Hazle Quant, DO  Infectious Disease Fellow  09/30/2022 12:53 PM    HPI     Ollice Kittell is a 37 y.o. male with a past medical history significant for ESRD s/p  DDKT in 2018 c/b CMV viremia and hypertension who was transferred from [facility] to [facility] on 7/6 for evaluation of acute kidney injury with need for urgent dialysis.     Roberto Rice reports that prior to his presentation, he had been experiencing diarrhea for the past 4-5 days. He has not had much intake because he has been watching his fluids to prevent volume overload. He notes that he did have a norovirus infection back in May; however, his symptoms from norovirus had resolved prior to onset of this diarrhea. He has had some associated nausea and abdominal pain, but no vomiting 2/2 not having anything on his stomach. Has had some dry heaving. Roberto Rice denies any fevers or chills. He has not had any diarrhea since yesterday. He is currently being monitored off of antibiotics and has remained afebrile.    Microbiology     Giardia/Crypto - in process  O&P  - in process  Blood Cultures (7/7)  NGTD  Cdiff pending   BKV  pending   CMV QNT pending  Crypto Ag negative    Anti-microbials     Atovaquone     Review of Systems     General: Denies fever or chills   Skin: Denies skin rash or wounds  Eyes: Denies change in vision  HEENT: +dry throat, no sore throat or congestion  Respiratory: Denies shortness of breath, cough or sputum production  Cardiac: Denies chest pain  GI: + abdominal pain,+ nausea, +diarrhea with interval improvement   Neuro: Denies dizziness    History     Past medical Hx: No past medical history on file.  Social Hx:   Social History     Tobacco Use    Smoking status: Never    Smokeless tobacco: Never     Family Hx: No family history on file.  Surgical Hx: No past surgical history on file.   Home meds: Med-Reconciliation from this admission was reviewed.    Allergies     No Known Allergies    Medications     Current Facility-Administered Medications   Medication    [START ON 10/01/2022] famotidine (PEPCID) tablet 20 mg    heparin (porcine) injection 5,000 Units    hydrALAZINE (APRESOLINE) 20 mg/mL injection 10 mg    mycophenolate (MYFORTIC) EC tablet 540 mg  NIFEdipine (PROCARDIA-XL) 24 hr tablet 60 mg    [START ON 10/01/2022] predniSONE (DELTASONE) tablet 5 mg    tacrolimus (PROGRAF) capsule 5 mg    tacrolimus (PROGRAF) capsule 6 mg        Vital Signs     Temp:  [98.4 F (36.9 C)-99.1 F (37.3 C)] 98.4 F (36.9 C)  Heart Rate:  [80-106] 85  Resp:  [11-18] 12  BP: (131-165)/(90-114) 154/105    Intake/Output Summary (Last 24 hours) at 09/30/2022 1253  Last data filed at 09/30/2022 0900  Gross per 24 hour   Intake 300 ml   Output 435 ml   Net -135 ml       Physical Exam     General: Patient resting in bed in no acute distress.   HEENT: EOMI, no tonsillar exudate or hypertrophy, No scleral icterus, No Thrush  CV: RRR, normal S1/S2, bilateral radial pulses 2+  Lung: CTA bilaterally, no wheezes, rales, or rhonchi, no increased work of breathing  Abdomen: Soft, epigastric abdominal ttp, no rebound or guarding  Skin: warm and  dry, no erythema or pain surrounding IV sites, no rashes  Neuro: Awake and alert, moves all extremities, fluent speech  Psych: Appropriate affect and behavior    Laboratory Data     Recent Labs     09/30/22  0044 09/30/22  0555   WBC 7.6 7.5   HGB 10.7* 9.9*   HCT 30.9* 27.8*   PLT 320 293      Recent Labs     09/30/22  0044 09/30/22  0555   NA 136 136   K 4.0 4.0   CL 101 102   CO2 21 21   BUN 52* 52*   CREATININE 11.04* 11.81*   GLUCOSE 105* 89   PHOS 5.6* 5.7*       Lab Results   Component Value Date    ALKPHOS 76 09/30/2022    ALT 8 09/30/2022    AST 13 09/30/2022    BILITOT 0.7 09/30/2022    ALBUMIN 3.5 09/30/2022    BILIDIRECT 0.13 09/30/2022    PROT 7.6 09/30/2022       Imaging     CXR  No acute cardiopulmonary abnormality.

## 2022-09-30 NOTE — Progress Notes (Signed)
Department of Internal Medicine  MICU Progress Note  Roberto Rice           [id number]  09/30/2022         8:00 AM  [id number]/UMICU-[id number]   LOS: 1 days     CODE Status: Full Code    Interval History   Roberto Rice is a 37 y.o. male with a PMH significant for kidney transplant in 2018 and hypertension  who presented as OSH on 7/5 with non-bloody diarrhea, n/v x4-5 days as well as weight gain and decreased PO who was found to be in acute renal failure requiring iHD. Patient also hypertensive requiring nitro gtt, while at OSH. He was  transferred to [facility] MICU for further evaluation.     Significant Events Over The Past 24 Hours:   - Nitro gtt stopped upon arrival   - Resumed home nifedipine 60 mg ER BID and PRN hydralazine 10 mg       DAILY PLAN   - Consult transplant nephrology, appreciate input  - Send EBV, BK virus PCR  - Obtain transplant renal US  - Consult to transplant ID, appreciate input   - Continue home tacrolimus 5 mg qAM and 6 mg qhs (goal 6-8)  - Continue home cellecept 540 mg   - Continue prednisone 10 mg  - Hold bactrim iso AKI and switch to ppx atovaquone 1500 mg daily   - Plan for renal biopsy by IR tomorrow   - Plan for iHD tomorrow, per nephrology     Planned Labs Today:   AM renal/mag/CBC   ROS     Patient seen lying in bed, in no acute distress. Reports abdominal pain. Denies chest pain, shortness of breath, pain, diarrhea, nausea/vomiting, palpitation, lightheadedness or dizziness.       Assessment & Plan     Roberto Rice is a 37 y.o. male on hospital day 1.  The principal reason for today's follow up visit is acute renal failure requiring iHD and evaluation by transplant nephrology.         Neurology   #Altered Mental status (resolved)  Secondary to metabolic acidosis vs uremia. Stroke protocol CTH negative at OSH. At baseline on presentation at [facility]      Pulmonology  NAI     Cardiovascular    Intake/Output Summary (Last 24 hours) at 09/30/2022 0800  Last data filed at 09/30/2022 0729  Gross per 24 hour   Intake  --   Output 255 ml   Net -255 ml     #Hypertension  Placed on nitro gtt at OSH for elevated blood pressures. Nitro gtt stopped on arrival with pressures 150s-160s  - Continue home nifedipine ER 60 mg BID with now dose  - Resume home imdur 30 mg if needed   - PRN hydralazine 20 mg PO for SBP > 180,    Gastrointestinal  #Diarrhea  See below in ID    Renal  #AKI  Creatinine elevated to Cr 11.3 -> 12.9 -> 12.3 at outside hospital. Baseline 2.4-2.7 per OSH records. Could be prerenal secondary to decreased PO intake/diarrhea vs acute rejection vs medication induced 2/2 bactrim. Less likely obstructing stone given OSH records detailing no hydronephrosis  - urine lytes c/w intrinsic process   - encourage PO intake  - Plan for iHD tomorrow, per nephrology      #History of kidney transplant in 2018  #Concern for rejection   Per patient, in May 2024 thought to have acute rejection given similar presentation of  symptoms. At home takes, tacrolimus 5 mg with breakfast daily and 6mg  at bedtime, mycophenolate 540 mg PO BID and was finishing a steroid taper, last taking prednisone 10 mg.   - transplant renal consult  - obtain tacro level (goal 6-8)  - Continue home tacro 5 mg qAM and 6 mg qhs  - Continue home cellcept 540 mg   - Prednisone 5mg  daily, starting tomorrow  - Holding bactrim iso AKI  - Switch to atovaquone 1.5 g daily  - Plan for renal biopsy by IR tomorrow   - Obtain transplant renal ultrasound       Infectious Diseases  #diarrhea  Presented to OSH with 5 day history of non-bloody diarrhea. Has not had any BM since arriving.   - Send C.diff studies, enteric pathogen panel, ova and parasite panel, strongyloides Ag, cyclospora smear   - Opportunistic infection testing including cryptococcus antigen serum, CMV PCR, BK, EBV   - Transplant ID following       Endocrine  NAI    Hematology/Oncology  NAI    Psychiatry  NAI       Diet  Nutrition:   Diet/Nutrition Orders    Diet renal     Frequency: Effective Now     Number of  Occurrences: Until Specified     Order Questions:      Suicide/Behavior Risk Modification? No       DVT/Anticoagulation: {SQ UFH  GI Ulcer Prophylaxis: None indicated  Nutrition: PO  LDA:   Patient Lines/Drains/Airways Status       Active Line / PIV Line       Name Placement date Placement time Site Days    HD Catheter Dual Lumen (Vas Cath) Non-tunneled Left Internal Jugular 09/29/22  2341  Internal Jugular  less than 1    Peripheral IV Left;Posterior Hand --  --  Hand  --    Peripheral IV 09/29/22 Anterior;Left;Proximal Forearm 09/29/22  2340  Forearm  less than 1                  Invasive Lines: Continue current access  Foley Cath: Present  Bowel Integrity: PEG/Laxatives ordered  Rectal Tube: None  Code Status: Full Code  Next of kin / POA update: Patient and significant other updated at bedside 7/7      Disposition:  MICU    Code Status: Full Code     Signed:  Solene Hereford Niel Hummer, PA  09/30/2022, 8:00 AM  MICU NP Phone: [phone]    This note was copied forward from the note written by Ephriam Jenkins, DO. I have reviewed and updated the history, physical exam, data, assessment and plan of the note so that it reflects the evaluation and management of the patient on 09/30/2022.        Access/Lines/Tubes/Drains     #Central:     #Arterial Lines:     #Peripheral:  Peripheral IV Left;Posterior Hand (Active)   Site Assessment No problems identified;Clean;Dry;Intact 09/29/22 2325   Line Status Blood return noted 09/29/22 2325   Dressing Status Clean;Dry;Intact 09/29/22 2325   Dressing Intervention New dressing 09/29/22 2325       Peripheral IV 09/29/22 Anterior;Left;Proximal Forearm (Active)   Site Assessment No problems identified;Clean;Dry;Intact 09/29/22 2325   Line Status Capped;Saline locked 09/29/22 2325   Dressing Status Clean;Dry;Intact 09/29/22 2325     #Tubes/Drains/HD Access:          Inpatient Medications   Inpatient Meds:  Scheduled:   famotidine  20 mg  Oral BID    heparin  5,000 Units Subcutaneous 3 times per day     NIFEdipine  60 mg Oral BID    [Held by provider] polyethylene glycol  17 g Oral Daily 0900    predniSONE  10 mg Oral Daily 0900       Continuous:    PRN:ICU electrolyte replacement protocol **AND** Initiate electrolyte replacement protocol    Mentation     CAM: Overall CAM-ICU : No Delirium  RASS: [place] Agitation Sedation Scale: 0     I/Os     Intake/Output Summary (Last 24 hours) at 09/30/2022 0800  Last data filed at 09/30/2022 0729  Gross per 24 hour   Intake --   Output 255 ml   Net -255 ml     I/O last 3 completed shifts:  In: -   Out: 175 [Urine:175]  Wt Readings from Last 3 Encounters:   09/29/22 204 lb 5.9 oz (92.7 kg)       Vent Settings   Ventilator Settings:       ABG:       Invalid input(s): CO2ART    Lactate:   Recent Labs     09/30/22  0044   LACTATE 0.8       Vital Signs   Temp:  [99 F (37.2 C)-99.1 F (37.3 C)] 99.1 F (37.3 C)  Heart Rate:  [80-106] 87  Resp:  [11-18] 14  BP: (131-165)/(90-114) 131/90  BP 131/90   Pulse 87   Temp 99.1 F (37.3 C) (Oral)   Resp 14   Ht 5' 11 (1.803 m)   Wt 204 lb 5.9 oz (92.7 kg)   SpO2 92%   BMI 28.50 kg/m     Temp (24hrs), Avg:99.1 F (37.3 C), Min:99 F (37.2 C), Max:99.1 F (37.3 C)    Patient Vitals for the past 4 hrs:   BP Pulse Resp SpO2   09/30/22 0700 131/90 87 14 92 %   09/30/22 0600 (!) 147/97 87 13 94 %   09/30/22 0500 (!) 146/95 92 15 98 %       BP  Min: 131/90  Max: 165/114  No data recorded  Temp  Avg: 99.1 F (37.3 C)  Min: 99 F (37.2 C)  Max: 99.1 F (37.3 C)  Pulse  Avg: 89.8  Min: 80  Max: 106  Resp  Avg: 13.9  Min: 11  Max: 18  SpO2  Avg: 96.1 %  Min: 92 %  Max: 99 %  BP  Min: 131/90  Max: 165/114  MAP (mmHg)  Avg: 112.1  Min: 101  Max: 124  MAP (mmHg)  Avg: 112.1  Min: 101  Max: 124    Physical Exam     General - Seen laying in bed in no acute distress.   HEENT - PERRL, conjugate gaze.  No evidence of cranial or facial trauma  Neck - No palpable adenopathy  CV - RRR without murmur or rub  Chest/Lungs - Bilateral symmetrical  respiratory excursions noted. Clear breath sounds, bilaterally   Abd - soft without tenderness or guarding  GU - deferred  Ext - no deformity or edema.  Neuro - Mood and affect appropriate.  CN's 2-12 grossly intact.  No gross motor or sensory deficit.      Labs     Renal  Recent Labs     09/30/22  0044 09/30/22  0555   NA 136 136   K 4.0 4.0   CL 101 102  CO2 21 21   BUN 52* 52*   CREATININE 11.04* 11.81*   CALCIUM 9.3 9.1   MG 1.9 1.9   PHOS 5.6* 5.7*    CBC  Recent Labs     09/30/22  0044 09/30/22  0555   HGB 10.7* 9.9*   HCT 30.9* 27.8*   PLT 320 293   WBC 7.6 7.5    Liver  Recent Labs     09/30/22  0044 09/30/22  0555   AST 13  --    ALT 8  --    ALKPHOS 76  --    ALBUMIN 3.6  3.6 3.5   BILITOT 0.7  --    BILIDIRECT 0.13  --    INR 1.1  --           Diagnostic Studies

## 2022-09-30 NOTE — Consults (Incomplete)
Interventional Radiology Consult Note       Date: 09/30/2022  Patient: Roberto Rice  DOB: Aug 05, 1985  MRN: [id number]    Primary Care Provider: ATTENDING PROVIDER UNKNOWN  Requesting Provider: Merrie Roof, PA  Chief Complaint: AKI    Reason for Consult: h/o renal transplant with concerns of rejction iso new acute renal failure     IR Procedure Request Received and Reviewed.    HPI:   Roberto Rice is a 37 y.o. male with PMHx ESRD s/p kidney transplant in 2018, HTN who is admitted from OSH for AKI. Patient has a 5 day history of diarrhea and decreased urination. Patient also reported increased weight at presentation. Initially presented to [facility] and left AMA from [name] after being transferred there. Represented to [facility] with AMS and was found to have AKI with Cr 11.3, BUN 64. Started on iHD at OSH prior to transfer. Upon transfer to [facility], Cr 11.04 --> 11.81. Given persistent renal failure, IR is consulted for transplant renal biopsy. Of note, patient had recent renal biopsy in May 2024 at OSH revealing acute rejection.    On evaluation, *      History:   No past medical history on file.  No past surgical history on file.  No family history on file.    Social Hx:  Social History     Tobacco Use   Smoking Status Never   Smokeless Tobacco Never     Social History     Substance and Sexual Activity   Drug Use Not on file     Social History     Substance and Sexual Activity   Alcohol Use Not on file       Allergies:  No Known Allergies     Home Medications:  Prior to Admission medications    Not on File       Current Medications:     Scheduled Medications:    atovaquone, 1,500 mg, DAILYWM  [START ON 10/01/2022] famotidine, 20 mg, Daily 0900  heparin, 5,000 Units, 3 times per day  mycophenolate, 540 mg, BID  NIFEdipine, 60 mg, BID  [START ON 10/01/2022] predniSONE, 5 mg, Daily 0900  simethicone, 80 mg, PC/HS  tacrolimus, 5 mg, DAILY 0900  tacrolimus, 6 mg, Nightly (2100)        IV Meds:        PRN Medications:  hydrALAZINE, 10 mg, Q8H PRN        ROS:  10 point ROS negative except as noted in HPI.    Vitals:     Vitals:    09/30/22 1200 09/30/22 1300 09/30/22 1400 09/30/22 1500   BP: (!) 159/97 (!) 153/98 129/85 (!) 141/93   BP Location: Left upper arm      Patient Position: Lying      BP Cuff Size: Large      Pulse: 94 98 91 101   Resp: 24 17 16 14    Temp: 97.9 F (36.6 C)      TempSrc: Axillary      SpO2: 98% 94% 95% 95%   Weight:       Height:           PE:  Gen: Awake, alert, no apparent distress, as stated age  HEENT: Normocephalic, atraumatic  Neck: Trachea midline  Chest: Clear bilaterally, respirations unlabored  CVS: RRR  ABD: Soft, non-tender  GU: deferred  EXT: Warm and well perfused  NEURO: No focal deficits  PSYCH: Appropriate  affect     Labs:   Recent Labs     09/30/22  0044 09/30/22  0555   WBC 7.6 7.5   HGB 10.7* 9.9*   HCT 30.9* 27.8*   MCV 92.3 91.6   PLT 320 293     Recent Labs     09/30/22  0044 09/30/22  0555   NA 136 136   K 4.0 4.0   CL 101 102   CO2 21 21   PHOS 5.6* 5.7*   BUN 52* 52*   CREATININE 11.04* 11.81*   CALCIUM 9.3 9.1     Recent Labs     09/30/22  0044   BILITOT 0.7   AST 13   ALT 8   ALKPHOS 76     Recent Labs     09/30/22  0044 09/30/22  0555   ALBUMIN 3.6  3.6 3.5   BILIDIRECT 0.13  --      Recent Labs     09/30/22  0044   INR 1.1   PROTIME 15.0       Imaging:  No results found for this or any previous visit from the past 72 hours.      No results found for this or any previous visit from the past 72 hours.        I personally reviewed the relevant findings from the above imaging results.        Medical Decision Making       Assessment:   Roberto Rice is a 37 y.o. male with hx kidney transplant who is admitted with allograft dysfunction. IR is consulted for transplant renal biopsy.      Plan:  1. Will proceed with transplant renal biopsy w/ US guidance.   2. Sedation Plan/Type: Local/Subcutaneous Lidocaine 1% and Moderate Sedation/Intravenous Fentanyl &  Versed  3. NPO at [facility] prior to procedure.  4. Current Anti-coagulation hold: Patient not on anticoagulation.       Consent:  Consent obtained and in IR    IR procedure was reviewed with Epimenio Sarin, MD, MS    Plan discussed with IR provider *** from *** team 09/30/2022, 4:16 PM.    Please call with any questions.    Additional Time Spent:  Time Spent with Patient:  Total face-to-face time spent with patient (greater than 50% of the visit was for the purpose of discussion and counseling):  *** minutes.       Vedia Coffer, MD  Vascular & Interventional Radiology  09/30/2022,4:16 PM  [facility]: [phone])

## 2022-09-30 NOTE — Progress Notes (Signed)
MICU Attending Note Addendum  I have seen and examined the patient. I have reviewed the past medical and surgical history, family history and social history (available on chart and if obtainable from patient/ family). I have reviewed the pertinent labs, imaging and other test results relevant to this MICU admission. I agree with the findings, exam and plan as documented in the note by Dr. Daphine Deutscher with following edits/ additional information.    2 M with h/o Renal Transplant in 2018, HTN, HLD. Recent hospitalization in April concerning for acute cellular rejection (biopsy proven at [facility]) and then hospitalized in May for diarrhea due to norovirus and C. Diff.     Transferred from [facility]. He had presented there on 7/4 for vomiting, diarrhea, edema, reduced UOP, AKI. He was transferred to [name] hospital where he had left AMA. On drive back he passed out and brought back to [facility]. He was awake and alert there. CT head was unremarkable. Was hypertensive req push dose of IV meds and then started on nitrate gtt. He underwent urgent dialysis at OSH before transfer here.     On arrival here he was complaining of headaches. We stopped the nitro gtt.     As per patient the Cr recovered to his baseline of ~2.5 after last hospitalization for norovirus. He has been adherent to his medications. Denies use of pain meds, NSAID, protein supplements. Has been having new diarrhea for past 5 days. Watery stools. No abdominal pain. No fever    Feeling better since dialysis    On exam:   Awake alert comfortable  S1, S2, RRR  Clear lungs  Soft non tender abdomen. Transplanted kidney in right lower quadrant. Non tender  No pedal edema    As per notes form OSH, patient had a kidney ultrasound at [name] that did not show any hydronephrosis     Assessment and Plan:    Acute renal failure  Could be related to diarrhea & poor PO intake (pre-renal trigger that led to ATN), rejection  Underwent iHD at OSH.  No acute indication for dialysis right now  - Follow tacro levels  - Consult txp Renal   - May need biopsy    Watery diarrhea  DDx for atypical causes in immunocompromised CMV, Norovirus, C. Diff, Cryptosporidiosis, MAC  - Will sent stool for WBC, Ova & Parasite, C. Diff, Cultures,   - Check for CMV quant, PCR, IgM, IgG  - Enteric pathogen panel    Hypertension urgency: Had elevated BP at OSH presentation with SBP 180. Was tranferred here on Nitro gtt.  - Stop nitro drip  - resume home meds of nifedipine ER 60 mg BID  - use hydralazine 10 IV for SBP > 170 prn     Altered mentation: Resolved.     H/o Renal transplant (2018), recent episode of Acute cellular rejection   Immunosuppression   - Continue current meds  Cellcept (mycophenolate) 540 DR BID  Tacrolimus 5am + 6pm HS  Prednisone 10 OD  - Follow Tacrolimus level  - Consult Renal txp service  - Hold Bactrim given AKI    Nifedipine ER 60 BD      DVT Prophylaxis: SQ UFH  GI Ulcer Prophylaxis: None indicated  Code Status: Full Code    I spent a total of 45 minutes of critical care time caring for this patient with Hypertensive urgency, AKI, including direct patient contact, management of life support systems review of data (i.e.: imaging  and lab), discussion with team members, and this time excludes time spent on procedures    [facility] Cletis Media, MD  Pulmonary and Critical Care Attending

## 2022-09-30 NOTE — Progress Notes (Signed)
Department of Internal Medicine   Transplant Nephrology  Consult History & Physical Note    Patient: Roberto Rice   MRN: [id number]    Date of Admit: 09/29/2022   Referring physician: Carlena Hurl Ramser, DO       HOSPITAL COURSE   Roberto Rice is 37 y.o. 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   Baseline cr 1.5-1.7 prior to April 2024- after the ACR episode pt was admitted in 07/2022 with norovirus infection profuse diarrhea and C DIFF colonization- cr peak at that time was 4.   He was trying to transfer his care from [facility] to [place]-   Got admitted to [facility] on 09/27/22 with diarrhea= cr before that admission was 2.4 to 2.7- [facility] [place] [facility]- left AMA from there and became altered on the way out- hence he was driven to [facility] where he had a session of dialysis for metabolic encephalopathy.   Patient was then transferred to [facility] for higher level of care.   Care everywhere at this point in time does not have any data. Above information was obtained from patient's mychart and transfer papers bedside.       ASSESSMENT      # S/P DDKT 2018  -ESRD due to HTN   -bcr prior to 06/2022- 1.4-1.7   -07/10/22- ACR 1b c4d neg treated with 2 doses of IVIG, IV steroid pulse.  -07/2022- norovirus related prerenal aki   -09/2022- admitted to OSH with AKI-D- 1 session for AMS.   Send UA, urine lytes, txp USG   -c.w same IS   -CMV, BK, EBV,DSA  -patient may need rpt biopsy if creatinine does not improve.   Renal/Allograft Function:  Recent Labs     09/30/22  0555 09/30/22  0044   BUN 52* 52*   CREATININE 11.81* 11.04*        Immunosuppression:  Myfortic 540 bd  Tac goal 6-8  Prednisone 5mg      Infectious Disease ppx:  H/o cmv viremia is 2018     Electrolytes:  Na: 136  K: 4.0   Cl: 102   Alb: 3.5   Mag: 1.9  Ca: 9.1  Phos: 5.7      Acid Base Status:   Anion Gap: 13  Bicarb: 21    Hypertension/CVS:   BP: (!) 160/110  On nifedipine 60       Volume Status: euvolemic       Mineral Bone Disease:   Ca: 9.1  PO4: 5.7   PTH: No results found for requested labs within last 3600 days. on No results found for requested labs within last 3600 days.  Vit D: No results found for requested labs within last 3600 days. on No results found for requested labs within last 3600 days.    Anemia of CKD:  Hgb 9.9  Iron No results found for requested labs within last 3600 days. on No results found for requested labs within last 3600 days.  Ferritin No results found for requested labs within last 3600 days. on No results found for requested labs within last 3600 days.  TIBC: No results found for requested labs within last 3600 days. on No results found for requested  labs within last 3600 days.  %Iron:    No results found for: TACROLMSLCMS, CYCLOSPLCMS       PLAN      -please get tac trough today   -has left temp line for HD  -fk goal 6-8, c.w myfortic 540 BD   -c.w prednisone 5mg    -will give him a session of HD tmr   -IR consult for renal biopsy  --please start gently hydration with NS  -send CMV,EBV,BK PCR  -GI pathogen testing, respiratory viral panel, stool ova, parasite, culture   -ua, urine lytes   -bladder scan , txp ultrasound   -strict input and output monitoring   -obtain tac trough in the am         Thank you for allowing [place] to participate in this patient's care. Discussed with Consult Staff.   Cecile Hearing   PGY4 Nephrology.  Pager no. [phone]     Chief Complaint  No chief complaint on file.      Reason for Consult  DDKT NOW WITH AKI -d    History of Present Illness  Roberto Rice is a 37 y.o. y/o male  has no past medical history on file.    Histories  he  has no past medical history on file.    he  has no past surgical history on file.    he family history is not on file.    he  reports  that he has never smoked. He has never used smokeless tobacco.     No Known Allergies    Medications:  Home Medications:  No medications prior to admission.     Inpatient Meds:   [START ON 10/01/2022] famotidine  20 mg Oral Daily 0900    heparin  5,000 Units Subcutaneous 3 times per day    mycophenolate  540 mg Oral BID    NIFEdipine  60 mg Oral BID    [Held by provider] polyethylene glycol  17 g Oral Daily 0900    predniSONE  10 mg Oral Daily 0900    tacrolimus  5 mg Oral DAILY 0900    tacrolimus  6 mg Oral Nightly (2100)     Continuous Infusions:  PRN medications:           Physical Exam  Patient Vitals for the past 4 hrs:   BP Temp Temp src Pulse Resp SpO2   09/30/22 1000 (!) 160/110 -- -- 90 18 98 %   09/30/22 0900 139/90 -- -- 98 17 96 %   09/30/22 0800 (!) 145/96 98.4 F (36.9 C) Oral 85 14 95 %   09/30/22 0700 131/90 -- -- 87 14 92 %       Constitutional: Normal appearance.   HENT:   Head: Normocephalic and atraumatic.   Nose: Nose normal.   Eyes: Pupils: Pupils are equal, round, and reactive to light.   Cardiovascular- Pulses: Normal pulses. Heart sounds: Normal heart sounds.   Pulmonary: Breath sounds: Normal breath sounds.   Abdominal:  Palpations: Abdomen is soft.   Musculoskeletal:   General: Normal range of motion.   SkinGeneral: Skin is warm and dry.   Neurological:  No focal deficit present.   Mental Status:  alert and oriented to person, place, and time.   Psychiatric:  Behavior: Behavior normal.     Wt Readings from Last 3 Encounters:   09/29/22 204 lb 5.9 oz (92.7 kg)       Intake/Output Summary (Last 24 hours) at 09/30/2022 1040  Last data filed at 09/30/2022 0800  Gross per 24 hour   Intake 300 ml   Output 255 ml   Net 45 ml       In addition to the above an extensive amount of complex data in the patients lab and chart were reviewed.    Diagnostic Imaging  Reviewed in EMR.

## 2022-10-01 ENCOUNTER — Inpatient Hospital Stay: Admit: 2022-10-01 | Payer: PRIVATE HEALTH INSURANCE

## 2022-10-01 ENCOUNTER — Inpatient Hospital Stay: Admit: 2022-10-01 | Payer: PRIVATE HEALTH INSURANCE | Attending: Diagnostic Radiology

## 2022-10-01 LAB — RENAL FUNCTION PANEL W/EGFR
Albumin: 3.3 g/dL — ABNORMAL LOW (ref 3.5–5.7)
Anion Gap: 13 mmol/L (ref 3–16)
BUN: 61 mg/dL — ABNORMAL HIGH (ref 7–25)
CO2: 21 mmol/L (ref 21–33)
Calcium: 8.8 mg/dL (ref 8.6–10.3)
Chloride: 102 mmol/L (ref 98–110)
Creatinine: 13.51 mg/dL — ABNORMAL HIGH (ref 0.60–1.30)
EGFR: 4
Glucose: 85 mg/dL (ref 70–100)
Osmolality, Calculated: 299 mOsm/kg (ref 278–305)
Phosphorus: 6 mg/dL — ABNORMAL HIGH (ref 2.1–4.7)
Potassium: 3.9 mmol/L (ref 3.5–5.3)
Sodium: 136 mmol/L (ref 133–146)

## 2022-10-01 LAB — HEPATITIS B LAB PANEL (HEMODIALYSIS PATIENTS ONLY)
HBSAB NUMBER: >500 m[IU]/mL — ABNORMAL HIGH (ref 0.00–9.99)
Hep B Core Total Ab: NONREACTIVE
Hep B S Ab: REACTIVE — AB
Hep B Surface Ag: NONREACTIVE

## 2022-10-01 LAB — TACROLIMUS LEVEL: Tacrolimus (LC-MS): 8.9 ng/mL (ref 3.0–15.0)

## 2022-10-01 LAB — CBC
Hematocrit: 28 % — ABNORMAL LOW (ref 38.5–50.0)
Hemoglobin: 9.8 g/dL — ABNORMAL LOW (ref 13.2–17.1)
MCH: 31.8 pg (ref 27.0–33.0)
MCHC: 34.9 g/dL (ref 32.0–36.0)
MCV: 91.2 fL (ref 80.0–100.0)
MPV: 6.9 fL — ABNORMAL LOW (ref 7.5–11.5)
Platelets: 299 10*3/uL (ref 140–400)
RBC: 3.07 10*6/uL — ABNORMAL LOW (ref 4.20–5.80)
RDW: 14 % (ref 11.0–15.0)
WBC: 6.5 10*3/uL (ref 3.8–10.8)

## 2022-10-01 LAB — MAGNESIUM: Magnesium: 2 mg/dL (ref 1.5–2.5)

## 2022-10-01 MED ORDER — midazolam (PF) (VERSED) injection
1 | INTRAMUSCULAR | Status: AC | PRN
Start: 2022-10-01 — End: 2022-10-01
  Administered 2022-10-01: 19:00:00 1 via INTRAVENOUS

## 2022-10-01 MED ORDER — hydrALAZINE (APRESOLINE) 20 mg/mL injection 10 mg
20 | Freq: Four times a day (QID) | INTRAMUSCULAR | Status: AC | PRN
Start: 2022-10-01 — End: 2022-10-10
  Administered 2022-10-01 – 2022-10-03 (×2): 10 mg via INTRAVENOUS

## 2022-10-01 MED ORDER — ondansetron (ZOFRAN) injection 4 mg
4 | Freq: Three times a day (TID) | INTRAMUSCULAR | Status: AC | PRN
Start: 2022-10-01 — End: 2022-10-10
  Administered 2022-10-03 – 2022-10-05 (×2): via INTRAVENOUS

## 2022-10-01 MED ORDER — fentaNYL (SUBLIMAZE) injection
50 | INTRAMUSCULAR | Status: AC | PRN
Start: 2022-10-01 — End: 2022-10-01
  Administered 2022-10-01: 19:00:00 50 via INTRAVENOUS

## 2022-10-01 MED ORDER — labetaloL (NORMODYNE) injection 20 mg
5 | Freq: Once | INTRAVENOUS | Status: AC | PRN
Start: 2022-10-01 — End: 2022-10-01
  Administered 2022-10-02: 02:00:00 20 mg via INTRAVENOUS

## 2022-10-01 MED ORDER — oxyCODONE (ROXICODONE) immediate release tablet 5 mg
5 | ORAL | PRN
Start: 2022-10-01 — End: 2022-10-10
  Administered 2022-10-03: 06:00:00 via ORAL

## 2022-10-01 MED ORDER — lidocaine 10 mg/mL (1 %) injection
10 | INTRAMUSCULAR | Status: AC | PRN
Start: 2022-10-01 — End: 2022-10-01
  Administered 2022-10-01: 19:00:00 10 via SUBCUTANEOUS

## 2022-10-01 MED ORDER — proMETHazine (PHENERGAN) injection 12.5 mg
25 | INTRAMUSCULAR | Status: AC | PRN
Start: 2022-10-01 — End: 2022-10-10
  Administered 2022-10-03 (×2): via INTRAVENOUS

## 2022-10-01 MED ORDER — fentaNYL (SUBLIMAZE) 50 mcg/mL injection
50 | INTRAMUSCULAR | Status: AC
Start: 2022-10-01 — End: 2022-10-02

## 2022-10-01 MED ORDER — midazolam (PF) (VERSED) 1 mg/mL injection
1 | INTRAMUSCULAR | Status: AC
Start: 2022-10-01 — End: 2022-10-02

## 2022-10-01 MED FILL — SIMETHICONE 80 MG CHEWABLE TABLET: 80 80 MG | ORAL | Qty: 1

## 2022-10-01 MED FILL — HEPARIN (PORCINE) 5,000 UNIT/ML INJECTION SOLUTION: 5000 5,000 unit/mL | INTRAMUSCULAR | Qty: 1

## 2022-10-01 MED FILL — TACROLIMUS 1 MG CAPSULE, IMMEDIATE-RELEASE: 1 1 MG | ORAL | Qty: 1

## 2022-10-01 MED FILL — MEPRON 750 MG/5 ML ORAL SUSPENSION: 750 750 mg/5 mL | ORAL | Qty: 10

## 2022-10-01 MED FILL — FAMOTIDINE 20 MG TABLET: 20 20 MG | ORAL | Qty: 1

## 2022-10-01 MED FILL — NIFEDIPINE ER 60 MG TABLET,EXTENDED RELEASE 24 HR: 60 60 MG (OSM) | ORAL | Qty: 1

## 2022-10-01 MED FILL — PREDNISONE 10 MG TABLET: 10 10 MG | ORAL | Qty: 1

## 2022-10-01 MED FILL — MIDAZOLAM (PF) 1 MG/ML INJECTION SOLUTION: 1 1 mg/mL | INTRAMUSCULAR | Qty: 2

## 2022-10-01 MED FILL — HYDRALAZINE 20 MG/ML INJECTION SOLUTION: 20 20 mg/mL | INTRAMUSCULAR | Qty: 1

## 2022-10-01 MED FILL — FENTANYL (PF) 50 MCG/ML INJECTION SOLUTION: 50 50 mcg/mL | INTRAMUSCULAR | Qty: 2

## 2022-10-01 MED FILL — TACROLIMUS 5 MG CAPSULE, IMMEDIATE-RELEASE: 5 5 MG | ORAL | Qty: 1

## 2022-10-01 MED FILL — MYFORTIC 180 MG TABLET,DELAYED RELEASE: 180 180 mg | ORAL | Qty: 3

## 2022-10-01 NOTE — Progress Notes (Signed)
INFECTIOUS DISEASE PROGRESS NOTE    10/01/2022  10:00 AM    Roberto Rice is a 37 y.o. male patient. Hospital Day: 2     Chief Complaint/Reason for Follow-up: diarrhea    Subjective:    Diarrhea significantly improved, per patient only 2 BM yesterday  Afebrile  No other new concerns      Review of Systems   Constitutional:  Positive for fatigue. Negative for chills and fever.   Respiratory:  Negative for cough and shortness of breath.    Cardiovascular:  Negative for chest pain and leg swelling.   Gastrointestinal:  Positive for diarrhea. Negative for abdominal pain.   Skin:  Negative for rash and wound.       Objective:  Vital signs:  Vitals:    10/01/22 0819   BP: 141/90   Pulse: 81   Resp: 16   Temp: 99 F (37.2 C)   SpO2: 97%     Temp last 24 hours:Temp (24hrs), Avg:98.1 F (36.7 C), Min:97.3 F (36.3 C), Max:99 F (37.2 C)    Scheduled Meds:   atovaquone  1,500 mg Oral DAILYWM    famotidine  20 mg Oral Daily 0900    heparin  5,000 Units Subcutaneous 3 times per day    mycophenolate  540 mg Oral BID    NIFEdipine  60 mg Oral BID    predniSONE  5 mg Oral Daily 0900    simethicone  80 mg Oral PC/HS    tacrolimus  5 mg Oral DAILY 0900    tacrolimus  6 mg Oral Nightly (2100)     Continuous Infusions:  PRN Meds:hydrALAZINE    Intake/Output last 3 shifts:  Date 09/30/22 0700 - 10/01/22 0659 10/01/22 0700 - 10/02/22 0659   Shift 0700-1459 1500-2259 2300-0659 24 Hour Total 0700-1459 1500-2259 2300-0659 24 Hour Total   INTAKE   P.O. 300 200  500         P.O. 300 200  500       Shift Total(mL/kg) 300(3.2) 200(2.2)  500(5.4)       OUTPUT   Urine(mL/kg/hr) 260(0.4) 180(0.2) 200(0.3) 640(0.3)         Urine 260 180 200 640       Stool             Stool Occurrence  1 x 1 x 2 x       Shift Total(mL/kg) 260(2.8) 180(1.9) 200(2.2) 640(6.9)       Weight (kg) 92.7 92.7 92.7 92.7 92.7 92.7 92.7 92.7       Physical Exam  Constitutional:       General: He is not in acute distress.  HENT:      Head: Normocephalic and atraumatic.    Eyes:      General: No scleral icterus.     Conjunctiva/sclera: Conjunctivae normal.   Pulmonary:      Effort: No respiratory distress.      Breath sounds: No wheezing.   Abdominal:      General: There is no distension.      Tenderness: There is no abdominal tenderness.   Skin:     General: Skin is warm.      Coloration: Skin is not jaundiced.   Neurological:      General: No focal deficit present.      Mental Status: He is alert and oriented to person, place, and time.         Labs:       Lab  10/01/22  0615   WBC 6.5   HEMOGLOBIN 9.8*   HEMATOCRIT 28.0*   MEAN CORPUSCULAR VOLUME 91.2   PLATELETS 299        Lab 10/01/22  0615   SODIUM 136   POTASSIUM 3.9   CHLORIDE 102   CO2 21   BUN 61*   CREATININE 13.51*   GLUCOSE 85   CALCIUM 8.8   MAGNESIUM 2.0   PHOSPHORUS 6.0*          Lab 09/30/22  0044   ALK PHOS 76   AST 13   ALT 8   BILIRUBIN TOTAL 0.7   BILIRUBIN DIRECT 0.13        Lab 09/30/22  0044   PROTHROMBIN TIME 15.0   INR 1.1          Assessment/Plan:  37 y.o. M s/p DDKT 2018 admitted for AKI and need for HD, concern for diarrhea as the etiology    Diarrhea  - spontaneously improved  - Ent path panel, Giardia/Crypto negative  - C diff, Strongyloides, CMV PCR, Cyclospora stool pending  - no indication for empiric abx at this time  - no evidence of recurrent Norovirus  - blood cx's NGTD    2. AKI  - Txp nephro following  - BK PCR pending  - Txp [place] pending    Lyanne Co, MD  10/01/2022  [phone]

## 2022-10-01 NOTE — Nursing Note (Signed)
Pt discharged from interventional radiology to 9023. Pt has met post procedure sedation criteria and is returning to inpatient care. Report called to [facility]. Patient procedure-specific discharge instructions have been added and will be available at discharge from inpatient care.

## 2022-10-01 NOTE — Progress Notes (Signed)
Physical Therapy   Reason Patient Not Seen     Name: Roberto Rice  DOB: January 28, 1986  Attending Physician: Carlena Hurl Ramser, DO  Admission Diagnosis: Acute kidney injury (CMS-HCC) [N17.9]  Date: 10/01/2022  Precautions: Precautions: None  Reviewed Pertinent hospital course: Yes    Unable to see patient due to:  Pt seen walking independently in his room with adequate balance reactions as observed with standing on one foot. Patient expresses no concerns at this time. Does not require any PT needs at this time and educated to notify medical team for new PT evaluation if any changes in mobility occur.    Time Attempted: 0818

## 2022-10-01 NOTE — Brief Op Note (Signed)
Vascular & Interventional Radiology Brief Op Note       Date: 10/01/2022  Patient: Roberto Rice  DOB: 1985/06/19  MRN: [id number]    IR Procedure(s) Performed:   US guided transplant kidney biopsy    Operators:  Kelle Darting, MD,     Pre-operative diagnosis:   Graft dysfunction    Post-operative diagnosis:  Same    Intra-procedural Medications:  Medications   Medication Event Details Admin User Admin Time   midazolam (PF) (VERSED) injection Medication Given Dose: 1 mg; Route: Intravenous Leona Singleton, RN 10/01/2022  2:50 PM   fentaNYL (SUBLIMAZE) injection Medication Given Dose: 50 mcg; Route: Intravenous Leona Singleton, RN 10/01/2022  2:50 PM   lidocaine 10 mg/mL (1 %) injection Medication Given Dose: 10 mL; Route: Subcutaneous; Site: Abdominal Tissue Kelle Darting, MD 10/01/2022  2:51 PM   fentaNYL (SUBLIMAZE) injection Medication Given Dose: 50 mcg; Route: Intravenous Leona Singleton, RN 10/01/2022  2:54 PM   midazolam (PF) (VERSED) injection Medication Given Dose: 1 mg; Route: Intravenous Leona Singleton, RN 10/01/2022  2:54 PM       Findings:  No post biopsy hematoma     Specimens removed:  3 18 G cores for surgical pathology, immunofluorescent, and electron microscopy.     Estimated Blood Loss:  Minimal (less than 15mL)    Complications:  None    Access site(s):  Right lower quadrant transplant kidney    Post procedure care/monitoring:    Monitor for access site bleeding  Maintain sBP <160    Recommendations/Follow-up:  Monitor for 3 hrs post biopsy  Follow-up pathology.    Please refer to full dictated Interventional Radiology report for details of findings/procedures, which can be located in [facility] Procedures (or EPIC Imaging) Tabs.      Please call with any questions.      Kelle Darting, MD  Vascular & Interventional Radiology  10/01/2022,3:05 PM  [facility]: [phone])

## 2022-10-01 NOTE — Discharge Instructions (Addendum)
Dialysis:   MONDAY, WED, FRIDAY @ 11:50AM chair time.   Please arrive about 10-15 mins early to your first visit 10/10/2022.   [facility]  [address]  [place], [place] [address]  [phone]    [name],  Here are your hospital discharge instructions:  --> You were hospitalized for: Diarrhea/vomiting. Acute kidney injury and transplant rejection.  --> The medications you will be taking are as follows:   Atovaquone (Mepron) and Valganciclovir (Valcyte)- infection prevention as a kidney transplant patient  Carvedilol (Coreg) and Nifedipine (Procardia) - to manage high blood pressure  Famotidine (Pepcid) - to help with stomach acidity  Vancomycin - (used for outpatient meds instead of fidaxomicin) to treat diarrhea caused by the bacteria C diff  Mycophenolate (Myfortic) and Tacrolimus (Prograf) - immunosuppressive drugs to help with transplant rejection     --> Avoid medications that could hurt your kidneys like NSAIDs (ex. Ibuprofen), check with your doctor before starting new medications     Follow up appointments:  Your new primary care doctor:  Date Time Provider Department Center   11/09/2022  1:40 PM Cherlynn Kaiser, MD (PCP) [facility]       Thank you, Internal Medicine [facility] Team    We value your decision to have [facility] Vascular & Interventional Radiology as part of your care team. Our team is focused on providing you with the highest quality of care.  Thank you for giving [place] the opportunity to care for you at this time.  If you have any questions or concerns that are related to your procedure or your care with [facility] Vascular & Interventional Radiology, please call [phone]).  If you have any questions of concerns regarding your care with other departments, please call the main hospital line [phone] to be directed to your physician.       Procedural Sedation, Care After  Refer to this sheet in the next 24 hours. These instructions provide you with information on caring for  yourself after your procedure. Your caregiver may also give you more specific instructions. Your treatment has been planned according to current medical practices, but problems sometimes occur. Call your caregiver if you have any problems or questions after your procedure.    WHAT IS PROCEDURAL SEDATION:  This type of sedation induces an altered level of consciousness that minimizes pain and discomfort through the use of pain relievers and sedatives. Patients who receive procedural sedation usually are able to speak and respond to verbal cues throughout the procedure. A brief period of amnesia may erase any memory of the procedure.    HOME CARE INSTRUCTIONS:  Do not participate in any activities that require you to be alert or coordinated.  Do not:  Drive  Swim  Ride a bicycle  Operate heavy Information systems manager  Work at [employer] a bath  Do not drink alcohol  Do not make any important decisions or sign legal documents.  Stay with an adult  The first meal following your procedure should be light and small. Avoid solid foods if you feel sick to your stomach (nauseous) or if you throw up (vomit).  Drink enough fluids to keep your urine clear or pale yellow.  Only take your usual medicines or new medicines if your caregiver approves them.  Only take over-the-counter or prescription medicines for pain, discomfort, or fever as directed by your caregiver.  Keep all follow-up appointments as directed  by your caregiver.    SEEK IMMEDIATE MEDICAL CARE IF:  You are not feeling normal or behaving normally after 24 hours.  You have persistent nausea and vomiting.  You are unable to drink fluids or eat food.  You have difficulty urinating.  You have difficulty waking or cannot be woken up.  You develop a rash.   You have difficulty breathing or develop chest pain.    We value your decision to have [place] V[facility] & Interventional Radiology as part of your care team. Our team is focused on  providing you with the highest quality of care.    Thank you for giving [place] the opportunity to care for you or your family member at this time.     If you have any questions or concerns that are related to your procedure or your care with [facility] Vascular & Interventional Radiology, please call [phone]).  If you have any questions of concerns regarding your care with other departments, please call the main hospital line [phone] to be directed to your physician.     Care after a Kidney Biopsy    Refer to this sheet in the next few weeks. These instructions provide you with information on caring for yourself after your procedure. Your health care provider may also give you more specific instructions. Your treatment has been planned according to current medical practices, but problems sometimes occur. Call your health care provider if you have any problems or questions after your procedure.     A kidney biopsy is a test that involves collecting small pieces of tissue through a needle. The tissue is then examined under a microscope. A kidney biopsy can help a health care provider make a diagnosis and determine the best course of treatment. A pathologist will look at the kidney tissue samples under a microscope and provide information about your kidney condition.    WHAT TO EXPECT AFTER THE PROCEDURE   You may feel some discomfort in your back and at the biopsy site for 1 week after the biopsy.   You may notice blood in the urine for the first 24 hours after the biopsy.   You may feel sleepiness and fatigue for the rest of the day.   You may see a small amount of bruising around the area where the biopsy was done.  HOME CARE INSTRUCTIONS  Rest at home for 1-2 days or as directed by your health care provider.  Have a friend or family member stay with you for at least 24 hours.  Because of the medicines used during the procedure, you should not do the following things in the first 24 hours:  Drive  Use  machinery  Be responsible for the care of other people  Sign legal documents  Take a bath or shower  Drink alcohol.  Do not lift anything heavier than 10 pounds or exercise for 1 week after this procedure.   Only take medicines for pain, fever, or discomfort as directed by your health care provider.  Keep the bandage (dressing) and biopsy site dry.  Remove the bandage (dressing) 48 hours after the procedure.  Please contact the physician who ordered this biopsy in about 5 days to get your test results.  SEEK MEDICAL CARE IF:  You have bloody urine more than 24 hours after the biopsy.    You develop a fever. An important fever is an oral temperature above 101.5 F (38.6 C).  You cannot urinate.  You have increasing pain at the biopsy site.    SEEK IMMEDIATE MEDICAL CARE IF:   You develop swelling, bloating or pain in your abdomen.  You become dizzy or faint.  You develop a rash.  You have chest pain, difficulty breathing, feel short of breath, or feel faint.

## 2022-10-01 NOTE — H&P (Signed)
Department of Internal Medicine  History & Physical    Patient: Roberto Rice  MRN: [id number]    Chief Complaint     Diarrhea    History of Present Illness     Kellon Martinka is a 37 y.o. 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 Was admitted to the MICU for hypertensive urgency on nitro gtt.     Pt reports diarrhea for about 1 week with decreased PO intake. He also had weight gain and decreased urination during this time. At OSH, found to have hypertensive urgency and started on nitro gtt, urgent dialysis for AKI In the MICU, nitro gtt was stopped and he was transferred to the floor.     Interval history   NAEO. Pt doing well, reports diarrhea is improved although he does have some abdominal pain. He and partner report that his partner also has diarrhea, though hers have not improved. They have noticed stringy bits in their stool which is different from baseline. Pt reports that increase in Cr is due to dietary indiscretions yesterday, and he wants to hold off on dialysis. Will get renal ultrasound and renal bx today.     Review of Systems     Review of Systems   Constitutional:  Negative for chills and fever.   Respiratory:  Negative for cough and shortness of breath.    Cardiovascular:  Negative for chest pain.   Gastrointestinal:  Positive for abdominal pain and diarrhea. Negative for blood in stool.   Genitourinary:  Negative for dysuria.       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 difficile diarrhea     Hypertension     Kidney transplant recipient      Past Surgical History:   Procedure Laterality Date    NEPHRECTOMY TRANSPLANTED ORGAN       No family history on file.  Social History     Tobacco Use    Smoking status: Never    Smokeless tobacco: Never      Home Medications     Prior to Admission  medications as of 10/01/22 1344   Medication Sig Taking?   isosorbide mononitrate (IMDUR) 30 MG 24 hr tablet Take 1 tablet (30 mg total) by mouth daily. Yes   mycophenolate (MYFORTIC) 180 MG EC tablet Take 3 tablets (540 mg total) by mouth 2 times a day. Yes   NIFEdipine (PROCARDIA-XL) 60 MG (OSM) 24 hr tablet Take 1 tablet (60 mg total) by mouth 2 times a day. Yes   potassium chloride (KLOR-CON M20) 20 MEQ tablet Take 2 tablets (40 mEq total) by mouth daily. Yes   sulfamethoxazole-trimethoprim (BACTRIM) 400-80 mg per tablet Take 1 tablet by mouth every Monday, Wednesday, and Friday. Yes   tacrolimus (PROGRAF) 1 MG capsule Take 6 capsules (6 mg total) by mouth at bedtime. Yes   cholecalciferol, vitamin D3, 1000 units tablet Take 2 tablets (2,000 Units total) by mouth daily.    predniSONE (DELTASONE) 10 MG tablet Take 1 tablet (10 mg total) by mouth daily.    tacrolimus (PROGRAF) 5 MG capsule Take 1 capsule (5 mg total) by mouth every morning.        No Known Allergies  Physical Exam  Temp:  [97.3 F (36.3 C)-99 F (37.2 C)] 97.3 F (36.3 C)  Heart Rate:  [81-101] 85  Resp:  [12-18] 12  BP: (129-167)/(85-111) 150/97    Physical Exam  Constitutional:       Appearance: Normal appearance.   Cardiovascular:      Rate and Rhythm: Normal rate and regular rhythm.      Heart sounds: Normal heart sounds.   Pulmonary:      Effort: No respiratory distress.      Breath sounds: Normal breath sounds.   Abdominal:      Palpations: Abdomen is soft.      Tenderness: There is abdominal tenderness in the epigastric area.   Musculoskeletal:      Right lower leg: No edema.      Left lower leg: No edema.   Neurological:      Mental Status: He is alert. Mental status is at baseline.   Psychiatric:         Mood and Affect: Mood normal.         Behavior: Behavior normal.       Diagnostic Studies     Labs: Significant for: Cr 11.81 -> 13.51    Significant for: renal US with normal appearance of transplanted kidney     Assessment & Plan      Aarish Forgette is a 37 y.o. 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    #AKI  Cr elevated from baseline 2.4-2.7 per OSH records. Etiologies include prerenal secondary to diarrhea and decreased intake vs intrarenal due to Bactrim vs acute rejection. Renal US shows no hydronephrosis. Vas cath in place for hemodialysis.  - transplant renal consulted   - strict I/Os     #Kidney transplant in 2018  #H/o acute transplant rejection  #Concern for rejection  Recent hospitalization in 06/2022 concerning for acute cellular rejection (biopsy proven at [facility]), treated with IVIG and steroids. Renal US shows normal appearance of transplanted kidney, no hydronephrosis.   - immunosuppression: tacro 5mg  AM/6mg  PM (goal level 6-8), cellcept 540mg  BID, pred 5mg    - holding Bactrim ppx given AKI, on atovaquone 1.5g   - adeno PCR   - transplant renal consulted   - IR consulted for renal bx 7/8    #Diarrhea   #Abd pain   Pt presenting with 1 week of nonbloody diarrhea and decreased PO intake. Previously admitted for diarrhea in 07/2022 with C diff colonization and norovirus. Pt has remained afebrile, WBC wnl. Diarrhea is improving.   - Bcx NGTD   - transplant ID consult    - infectious workup negative so far     #HTN  Transferred with hypertensive urgency on nitro gtt, now resolved. BP elevated to 150/100s.   - home nifedipine ER 60mg  BID   - hydralazine 20mg  PRN PO for SBP > 180     #Chronic anemia of CKD - Hgb stable     Orders Placed This Encounter   Procedures    Diet renal     DVT prophylaxis: Subcutaneous heparin (prophylaxis)  Code Status: Full Code      [name], MD  10/01/2022 1:57 PM

## 2022-10-01 NOTE — Progress Notes (Signed)
Interventional Radiology Pre-Procedure Sedation Evaluation Update       Date: 10/01/2022  Patient: Roberto Rice  DOB: November 11, 1985  MRN: [id number]      HPI:   Roberto Rice is a 37 y.o. male presenting to Interventional Radiology for transplant renal biopsy.       Pre-Sedation Evaluation:  Mallampati:  II  Mouth Opening:  > 4 cm  Facial Hair: No  Short Neck: No    ASA Score: II - Mild systemic disease with no functional limitations  Sedation-Specific History Concerns: None    This patient was re-evaluated immediately prior to sedation administration.               Same Day Sedation Plan Update       Assessment:     Roberto Rice is a 37 y.o. male presenting to IR for their procedure today for transplant renal biopsy.    The patient's medical records, history and physical have been reviewed and there have been no interim changes since last documentation. The patient was re-evaluated on the day of the procedure, prior to sedation administration.    Sedation Plan:    1. Sedation Plan/Type: Moderate Sedation/Intravenous Fentanyl & Versed  2. Current Anti-coagulation hold:  Patient not on anticoagulation.         Kelle Darting, MD  Vascular & Interventional Radiology  10/01/2022,2:45 PM  [facility]: [phone])

## 2022-10-01 NOTE — Progress Notes (Signed)
Occupational Therapy   Reason Patient Not Seen     Name: Roberto Rice  DOB: 22-Nov-1985  Attending Physician: Carlena Hurl Ramser, DO  Admission Diagnosis: Acute kidney injury (CMS-HCC) [N17.9]  Date: 10/01/2022  Precautions: Precautions: None  Reviewed Pertinent hospital course: Yes    Unable to see patient due to: Pt independently ambulating in room and finishing showering/dressing upon arrival. Pt expresses no concerns at this time about ability to safely and effectively complete functional tasks. Pt with no skilled OT needs at this time. Please reconsult if new needs arise.     Time Attempted: [phone]

## 2022-10-01 NOTE — Plan of Care (Signed)
Problem: Safety  Goal: Patient will be injury free during hospitalization  Description: Assess and monitor vitals signs, neurological status including  level of consciousness and orientation. Assess patient's risk for falls and  implement fall prevention plan of care and interventions per hospital policy.    Ensure arm band on, uncluttered walking paths in room, adequate room lighting,  call light and overbed table within reach, bed in low position, wheels locked,  side rails up per policy, and non-skid footwear provided.  Outcome: Progressing    Problem: Patient will remain free of falls  Goal: Universal Fall Precautions  Outcome: Progressing    Problem: Daily Care  Goal: Daily care needs are met  Description: Assess and monitor ability to perform self care and identify  potential discharge needs.  Outcome: Progressing

## 2022-10-01 NOTE — Plan of Care (Signed)
Problem: Safety  Goal: Patient will be injury free during hospitalization  Description: Assess and monitor vitals signs, neurological status including level of consciousness and orientation. Assess patient's risk for falls and implement fall prevention plan of care and interventions per hospital policy.      Ensure arm band on, uncluttered walking paths in room, adequate room lighting, call light and overbed table within reach, bed in low position, wheels locked, side rails up per policy, and non-skid footwear provided.   Outcome: Progressing     Problem: Patient will remain free of falls  Goal: Universal Fall Precautions  Outcome: Progressing     Problem: Daily Care  Goal: Daily care needs are met  Description: Assess and monitor ability to perform self care and identify potential discharge needs.  Outcome: Progressing     Problem: Psychosocial Needs  Goal: Demonstrates ability to cope with hospitalization/illness  Description: Assess and monitor patients ability to cope with his/her illness.  Outcome: Progressing  Goal: Collaborate with patient/family to identify patient's goals  Outcome: Progressing     Problem: Discharge Barriers  Goal: Patient's discharge needs are met  Description: Collaborate with interdisciplinary team and initiate plans and interventions as needed.   Outcome: Progressing     Problem: Patient will remain free of injury d/t fall  Goal: High Fall Risk Precautions  Outcome: Progressing  Goal: Additional High Fall Risk Precautions (consider the following)  Outcome: Progressing
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
## 2022-10-02 LAB — CBC
Hematocrit: 28.5 % — ABNORMAL LOW (ref 38.5–50.0)
Hemoglobin: 9.8 g/dL — ABNORMAL LOW (ref 13.2–17.1)
MCH: 31.4 pg (ref 27.0–33.0)
MCHC: 34.5 g/dL (ref 32.0–36.0)
MCV: 91.1 fL (ref 80.0–100.0)
MPV: 7 fL — ABNORMAL LOW (ref 7.5–11.5)
Platelets: 297 10*3/uL (ref 140–400)
RBC: 3.13 10*6/uL — ABNORMAL LOW (ref 4.20–5.80)
RDW: 14.1 % (ref 11.0–15.0)
WBC: 7.1 10*3/uL (ref 3.8–10.8)

## 2022-10-02 LAB — PROTEIN / CREATININE RATIO, URINE
Creatinine, Urine: 62.3 mg/dL
Prot/Creat Ratio, Ur: 2.05 ratio
Total Protein, Ur: 128 mg/dL

## 2022-10-02 LAB — RENAL FUNCTION PANEL W/EGFR
Albumin: 3.4 g/dL — ABNORMAL LOW (ref 3.5–5.7)
Anion Gap: 16 mmol/L (ref 3–16)
BUN: 68 mg/dL — ABNORMAL HIGH (ref 7–25)
CO2: 18 mmol/L — ABNORMAL LOW (ref 21–33)
Calcium: 9 mg/dL (ref 8.6–10.3)
Chloride: 100 mmol/L (ref 98–110)
Creatinine: 15.11 mg/dL — ABNORMAL HIGH (ref 0.60–1.30)
EGFR: 4
Glucose: 84 mg/dL (ref 70–100)
Osmolality, Calculated: 297 mOsm/kg (ref 278–305)
Phosphorus: 6.6 mg/dL — ABNORMAL HIGH (ref 2.1–4.7)
Potassium: 4.1 mmol/L (ref 3.5–5.3)
Sodium: 134 mmol/L (ref 133–146)

## 2022-10-02 LAB — URINALYSIS W/RFL TO MICROSCOPIC
Bilirubin, UA: NEGATIVE
Ketones, UA: NEGATIVE mg/dL
Leukocyte Esterase, UA: NEGATIVE
Nitrite, UA: NEGATIVE
Protein, UA: 100 mg/dL — AB
RBC, UA: 4 /HPF — ABNORMAL HIGH (ref 0–3)
Specific Gravity, UA: 1.008 (ref 1.005–1.035)
Urobilinogen, UA: <2 mg/dL (ref 0.2–1.9)
WBC, UA: 5 /HPF (ref 0–5)
pH, UA: 7.5 (ref 5.0–8.0)

## 2022-10-02 LAB — ADENOVIRUS, QUANTITATIVE PCR: Adenovirus, Quantitative PCR: 0 copies/mL (ref 0–0)

## 2022-10-02 LAB — MAGNESIUM: Magnesium: 2 mg/dL (ref 1.5–2.5)

## 2022-10-02 LAB — TACROLIMUS LEVEL: Tacrolimus (LC-MS): 8.8 ng/mL (ref 3.0–15.0)

## 2022-10-02 MED ORDER — sodium chloride 0.9% IV bolus 1,000 mL
0.9 | Freq: Once | INTRAVENOUS | Status: AC
Start: 2022-10-02 — End: 2022-10-02
  Administered 2022-10-02: 20:00:00 1000 mL via INTRAVENOUS

## 2022-10-02 MED ORDER — diphenhydrAMINE (BENADRYL) capsule 25 mg
25 | Freq: Once | ORAL | Status: AC
Start: 2022-10-02 — End: 2022-10-02
  Administered 2022-10-02: 21:00:00 25 mg via ORAL

## 2022-10-02 MED ORDER — antithymocyte globulin (rabbit) 1.5 mg/kg = 150 mg, heparin (porcine) 1,000 unit/mL 1,000 Units, hydrocortisone sod succ (PF) (SOLU-CORTEF) 25 mg in sodium chloride 0.9 % 500 mL IVPB
100 | Freq: Once | INTRAVENOUS | Status: AC
Start: 2022-10-02 — End: 2022-10-03
  Administered 2022-10-02: 22:00:00 150 mg/kg via INTRAVENOUS

## 2022-10-02 MED ORDER — methylPREDNISolone sodium succinate (SOLU-medrol) 250 mg in sodium chloride 0.9 % 100 mL IVPB
500 | Freq: Once | INTRAVENOUS | Status: AC
Start: 2022-10-02 — End: 2022-10-02
  Administered 2022-10-02: 22:00:00 250 mg via INTRAVENOUS

## 2022-10-02 MED ORDER — acetaminophen (TYLENOL) tablet 650 mg
325 | Freq: Once | ORAL | Status: AC
Start: 2022-10-02 — End: 2022-10-02
  Administered 2022-10-02: 21:00:00 650 mg via ORAL

## 2022-10-02 MED ORDER — For Catheter Lock: gentamicin 0.32 mg/mL and citrate 4% antibiotic IV lock
0.32 | PRN
Start: 2022-10-02 — End: 2022-10-10
  Administered 2022-10-02: 15:00:00 5 mL
  Administered 2022-10-06 – 2022-10-08 (×2)

## 2022-10-02 MED ORDER — mycophenolate (MYFORTIC) EC tablet 720 mg
360 | Freq: Two times a day (BID) | ORAL
Start: 2022-10-02 — End: 2022-10-06
  Administered 2022-10-02: 13:00:00 720 mg via ORAL
  Administered 2022-10-03: 14:00:00 via ORAL
  Administered 2022-10-03: 720 mg via ORAL
  Administered 2022-10-04 – 2022-10-06 (×6): via ORAL

## 2022-10-02 MED FILL — METHYLPREDNISOLONE SODIUM SUCCINATE 500 MG INTRAVENOUS SOLUTION: 500 500 mg | INTRAVENOUS | Qty: 0.5

## 2022-10-02 MED FILL — GENTAMICIN 0.32 MG/ML /CITRATE 4% ANTIBIOTIC IV LOCK: 0.32 0.32 mg/mL | Qty: 5

## 2022-10-02 MED FILL — HEPARIN (PORCINE) 5,000 UNIT/ML INJECTION SOLUTION: 5000 5,000 unit/mL | INTRAMUSCULAR | Qty: 1

## 2022-10-02 MED FILL — PREDNISONE 10 MG TABLET: 10 10 MG | ORAL | Qty: 1

## 2022-10-02 MED FILL — MYFORTIC 180 MG TABLET,DELAYED RELEASE: 180 180 mg | ORAL | Qty: 3

## 2022-10-02 MED FILL — LABETALOL 5 MG/ML INTRAVENOUS SOLUTION: 5 5 mg/mL | INTRAVENOUS | Qty: 20

## 2022-10-02 MED FILL — TYLENOL 325 MG TABLET: 325 325 mg | ORAL | Qty: 2

## 2022-10-02 MED FILL — DIPHENHYDRAMINE 25 MG CAPSULE: 25 25 mg | ORAL | Qty: 1

## 2022-10-02 MED FILL — MEPRON 750 MG/5 ML ORAL SUSPENSION: 750 750 mg/5 mL | ORAL | Qty: 10

## 2022-10-02 MED FILL — SIMETHICONE 80 MG CHEWABLE TABLET: 80 80 MG | ORAL | Qty: 1

## 2022-10-02 MED FILL — MYFORTIC 360 MG TABLET,DELAYED RELEASE: 360 360 mg | ORAL | Qty: 2

## 2022-10-02 MED FILL — THYMOGLOBULIN 25 MG INTRAVENOUS SOLUTION: 25 25 mg | INTRAVENOUS | Qty: 6

## 2022-10-02 MED FILL — HYDRALAZINE 20 MG/ML INJECTION SOLUTION: 20 20 mg/mL | INTRAMUSCULAR | Qty: 1

## 2022-10-02 MED FILL — FAMOTIDINE 20 MG TABLET: 20 20 MG | ORAL | Qty: 1

## 2022-10-02 MED FILL — TACROLIMUS 1 MG CAPSULE, IMMEDIATE-RELEASE: 1 1 MG | ORAL | Qty: 1

## 2022-10-02 MED FILL — NIFEDIPINE ER 60 MG TABLET,EXTENDED RELEASE 24 HR: 60 60 MG (OSM) | ORAL | Qty: 1

## 2022-10-02 MED FILL — TACROLIMUS 5 MG CAPSULE, IMMEDIATE-RELEASE: 5 5 MG | ORAL | Qty: 1

## 2022-10-02 NOTE — Consults (Signed)
[facility]  Care Management/Social Work Assessment      Patient Information     Patient Name: Roberto Rice  MRN: [id number]  Hospital Day: 3  Inpatient/Observation: Inpatient  Admit Date: 09/29/2022  Admission Diagnosis: Acute kidney injury (CMS-HCC) [N17.9]  Attending provider: Emeterio Reeve, MD    PCP: ATTENDING PROVIDER UNKNOWN  Home Pharmacy:            No Pharmacies Listed     Pertinent Medications  Anticoagulation therapy: No  New Diabetic: No      Issues related to obtaining medications: NA    Payor Information   Medical Insurance Coverage:  Payor: [facility] [place] / Plan: [facility] [place] MEDICAID / Product Type: Medicaid Mngd care /   Secondary Payor: NA    Functional Assessment   Functional Assessment  Assessment Information Obtained From:: Patient, Spouse  May We Obtain Collateral Information From Family, Friends and Neighbors?: Yes  How do you wish to be addressed?: Roberto Rice  Current Mental Status: Awake, Oriented to Person, Oriented to Place, Oriented to Time, Oriented to Situation  Mental Status Prior to Admission: Unable to Assess  Mental Health History: No  Suicide Attempts: No  Activities of Daily Living: Independent  Work History: Full-time  Job-Profession:: [employer]  Marital Status: Married  Number of children and their names: minor children  Relative Search Completed: No  Demographics Correct:: Yes    Current Living Arrangements   Current Living Arrangements  Current Living Arrangements: Home  Type of Housing: House  Who do you live with?: With Family  What family member?: spouse and children  One Story or Two (check all that apply): One Story  History of Falls?: No    Electrical engineer at Home: Not Applicable  Was any abuse reported by patient?: No    Veteran Status & Connection to [employer] Status & Connection to [employer]  Are you a veteran?: No    Support Systems   Emergency contact: Extended Emergency Contact Information  Primary  Emergency Contact: Ciocca,Rhonda  Mobile Phone: [phone]  Relation: Spouse  Preferred language: English  Interpreter needed? No    Support Systems  Legal Status: N/A  Primary Caregiver: Self  Times of available support: Total 24/7 hands on (add comment)  Marital Status: Married  Number of children and their names: minor children  Relative Search Completed: No  Demographics Correct:: Yes  Expected Discharge Disposition: Home  Next of Kin: Albin Felling  Next of Kin Relationship: Spouse  Next of Kin Phone Number: [phone]  Assessment Information Obtained From:: Patient, Spouse    Other Pertinent Information     Patient wife is LNOK. No HCPOA on file.   Advance Directives (For Healthcare)  Advance Directive: Patient does not have advance directive  Healthcare Agent Appointed: No  Pre-existing DNR/DNI Order: No  Patient Requests Assistance: No    Discharge Plan   Per H&P, Roberto Rice is a 37 y.o. 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 Was admitted to the MICU for hypertensive urgency on nitro gtt.     Per Erskine Emery, patient is not medically ready to discharge at this time. Transplant and Renal following. May need plan for tunneled line placement and HD outpatient for discharge.     Met with patient to initiate discussion regarding discharge planning. Introduced self and  role of case management/social work and provided contact information. Patient wife present with patient permission. SW confirmed demographics and contact information. Patient reports being independent at baseline. Does not use any DME for ambulation. Not active with HHC, INF, O2, HD, TF. Not active with any other community resources. Denies MH, substance abuse, alcohol abuse. Patient reports he had his transplant in 2018 while living in [place]. He received HD while residing in [place]. Patient home is 2.5 hours away from [facility]. Patient reports that he  met with Transplant team today and was notified that they will not follow him at [facility] and suggested for SW to assist with additional transplant providers. [facility] to clarify, as inpatient SW does not work with Transplant or eligibility for further transplant.     Patient reports that if he needs dialysis at discharge, would prefer not to use [facility]. Aware that [facility] is the closest to his home. Would want [facility] instead located at [address]  [place], [place] [address] [phone]. Unsure if this agency will accept as it is located in [facility] and patient has [place] MEDICAID.     Patient and wife also expressed that wife drove here from [place], [place]. Plans to stay while patient is admitted. Reports that they receive food stamps but left the food stamp card at home for family to use/assist with minor children. Reports that wife works part time and used the last of her money in [employer] since patient has been admitted. Asking for food vouchers. SW explained that we cannot provide multiple food vouchers or more than 1 per day and 1 voucher is only for $10. SW spoke with management and obtained 1 $10 voucher to use for today and 3 $10 [employer] gift cards. SW provided to patients wife and explained that no further amount could be provided for this week. Will have to look into situation further if patient remains admitted next week. Wife would likely need to drive home as patient is not expected for surgery or ICU level of care at this time.        Anticipated Discharge Plan: Home, possible HD at discharge    Anticipated Discharge Date: 7/13    Anticipated Transportation: wife    Patient/Family aware and taking part in the discharge plan.  Patient/family educated that once post-acute care needs have been identified, a provider list applicable to the identified post-acute care needs as well as the insurance provider will be provided, and patient/family have the freedom to  choose their provider(s); financial interest(s) are disclosed as appropriate.         Chapman Moss, MSW  Phone Number: [phone]

## 2022-10-02 NOTE — Consults (Signed)
Interventional Radiology Consult Note       Date: 10/02/2022  Patient: Roberto Rice  DOB: September 10, 1985  MRN: [id number]    Primary Care Provider: ATTENDING PROVIDER UNKNOWN  Requesting Provider: MD J. Stanek   Chief Complaint: No chief complaint on file.     Reason for Consult: Need for long-term hemodialysis access     IR Procedure Request Received and Reviewed.    HPI:   Roberto Rice is a 37 y.o. male with a PMHx of ESRD 2/2 HTN s/p kidney transplant in 2018, admitted as OSH transfer for AKI in setting of renal transplant with concern for acute rejection. ID following for 1 week non-bloody diarrhea, negative. Patient has a LIJ trialysis for iHD and will require tunneled access for long-term access.    IR is consulted for a tunneled dialysis catheter placement.     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 difficile diarrhea     Hypertension     Kidney transplant recipient      Past Surgical History:   Procedure Laterality Date    NEPHRECTOMY TRANSPLANTED ORGAN       No family history on file.    Social Hx:  Social History     Tobacco Use   Smoking Status Never   Smokeless Tobacco Never     Social History     Substance and Sexual Activity   Drug Use Not on file     Social History     Substance and Sexual Activity   Alcohol Use None       Allergies:  No Known Allergies     Home Medications:  Prior to Admission medications    Medication   isosorbide mononitrate (IMDUR) 30 MG 24 hr tablet   mycophenolate (MYFORTIC) 180 MG EC tablet   NIFEdipine (PROCARDIA-XL) 60 MG (OSM) 24 hr tablet   potassium chloride (KLOR-CON M20) 20 MEQ tablet   sulfamethoxazole-trimethoprim (BACTRIM) 400-80 mg per tablet   tacrolimus (PROGRAF) 1 MG capsule   cholecalciferol, vitamin D3, 1000 units tablet   predniSONE (DELTASONE) 10 MG tablet   tacrolimus (PROGRAF) 5 MG capsule       Current Medications:      Scheduled Medications:    acetaminophen, 650 mg, Once  antithymocyte globulin (rabbit) 1.5 mg/kg = 150 mg, heparin (porcine) 1,000 unit/mL 1,000 Units, hydrocortisone sod succ (PF) (SOLU-CORTEF) 25 mg in sodium chloride 0.9 % 500 mL IVPB, 1.5 mg/kg, Once  atovaquone, 1,500 mg, DAILYWM  diphenhydrAMINE, 25 mg, Once  famotidine, 20 mg, Daily 0900  heparin, 5,000 Units, 3 times per day  methylprednisolone (SOLU-medrol) IV, 250 mg, Once  mycophenolate, 720 mg, BID  NIFEdipine, 60 mg, BID  predniSONE, 5 mg, Daily 0900  simethicone, 80 mg, PC/HS  tacrolimus, 5 mg, DAILY 0900  tacrolimus, 6 mg, Nightly (2100)        IV Meds:       PRN Medications:  For Catheter Lock: gentamicin 0.32 mg/mL and citrate 4% antibiotic IV lock, 5 mL, UD PRN  hydrALAZINE, 10 mg, Q8H PRN  hydrALAZINE, 10 mg, Q6H PRN  ondansetron, 4 mg, Q8H PRN  oxyCODONE, 5 mg, Q4H PRN  proMETHazine, 12.5 mg, Q4H PRN        ROS:  Negative General: Denies fever, chills or weakness  Cardiovascular: Denies chest pain, chest pressure or palpitations  Respiratory: Denies shortness of breath, wheezing or cough  GI: Denies abdominal pain or n/v/d  GU: Denies pain with urination or blood in urine  Skin: Denies rashes or bruising  Neurological: Denies headaches, dizziness, gait problems, or seizures  Extremities: Denies swelling or numbness/tingling  Psychiatric: Denies hallucinations and memory loss    Vitals:     Vitals:    10/02/22 1315 10/02/22 1330 10/02/22 1338 10/02/22 1344   BP: (!) 150/96 (!) 151/95 (!) 148/97 (!) 151/97   BP Location:       Patient Position:       BP Cuff Size:       Pulse: 83 86 86 84   Resp: 18 18 18 18    Temp:    98 F (36.7 C)   TempSrc:    Oral   SpO2:    100%   Weight:    204 lb 2.3 oz (92.6 kg)   Height:           PE:  Gen: Awake, alert, no apparent distress, as stated age  HEENT: Normocephalic, atraumatic, mucous membranes pink and moist  Neck: Supple, symmetrical, trachea midline and mobile. Temporary catheter in the left  ij  Chest: respirations unlabored  ABD: Soft, non-tender  PSYCH: Appropriate affect     Labs:   Recent Labs     09/30/22  0555 10/01/22  0615 10/02/22  0345   WBC 7.5 6.5 7.1   HGB 9.9* 9.8* 9.8*   HCT 27.8* 28.0* 28.5*   MCV 91.6 91.2 91.1   PLT 293 299 297     Recent Labs     09/30/22  0555 10/01/22  0615 10/02/22  0345   NA 136 136 134   K 4.0 3.9 4.1   CL 102 102 100   CO2 21 21 18*   PHOS 5.7* 6.0* 6.6*   BUN 52* 61* 68*   CREATININE 11.81* 13.51* 15.11*   CALCIUM 9.1 8.8 9.0     Recent Labs     09/30/22  0044   BILITOT 0.7   AST 13   ALT 8   ALKPHOS 76     Recent Labs     09/30/22  0044 09/30/22  0555 10/01/22  0615 10/02/22  0345   ALBUMIN 3.6  3.6 3.5 3.3* 3.4*   BILIDIRECT 0.13  --   --   --      Recent Labs     09/30/22  0044   INR 1.1   PROTIME 15.0       Imaging:  No results found for this or any previous visit from the past 72 hours.      No results found for this or any previous visit from the past 72 hours.        I personally reviewed the relevant findings from the above imaging results.    Pre-Sedation Evaluation  Mallampati:  III  Mouth Opening:  > 4 cm  Facial Hair: Yes  Short Neck: No    ASA Score: III - Moderate systematic disease with functional limitations  Sedation-Specific History Concerns: None    This patient was re-evaluated immediately prior to sedation administration.          Medical Decision Making       Assessment:   Roberto Rice is a 37 y.o. male with a PMHx of ESRD 2/2 HTN s/p kidney transplant in 2018, admitted as OSH transfer for AKI  in setting of renal transplant with concern for acute rejection. ID following for 1 week non-bloody diarrhea, negative. Patient has a LIJ trialysis for iHD and will require tunneled access for long-term access.    IR is consulted for a tunneled dialysis catheter placement.     No AC/coags wdl   Contact plus   HDS on RA, floor status       Plan:  1. Will proceed with Placement of TDC.    2. Sedation Plan/Type: Moderate Sedation/Intravenous Fentanyl &  Versed  3. NPO at [facility] prior to procedure.  4. Current Anti-coagulation hold: Patient not on anticoagulation.  5. Contrast Allergy: None       Consent:  Consent obtained and in IR    IR procedure was reviewed with Kelle Darting, MD      Please call with any questions.        Patrina Levering, DO  Vascular & Interventional Radiology  10/02/2022,2:34 PM  [facility]: [phone])
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
## 2022-10-02 NOTE — Plan of Care (Signed)
Problem: Safety  Goal: Patient will be injury free during hospitalization  Description: Assess and monitor vitals signs, neurological status including level of consciousness and orientation. Assess patient's risk for falls and implement fall prevention plan of care and interventions per hospital policy.      Ensure arm band on, uncluttered walking paths in room, adequate room lighting, call light and overbed table within reach, bed in low position, wheels locked, side rails up per policy, and non-skid footwear provided.   Outcome: Progressing     Problem: Patient will remain free of falls  Goal: Universal Fall Precautions  Outcome: Progressing     Problem: Patient will remain free of injury d/t fall  Goal: High Fall Risk Precautions  Outcome: Progressing

## 2022-10-02 NOTE — Progress Notes (Signed)
Department of Internal Medicine  Daily Progress Note  Roberto Rice  10/02/2022    Chief Complaint / Reason for Follow-Up     AKI (acute kidney injury) (CMS-HCC)       Interval History / Subjective     No acute events overnight. Pt on dialysis this AM. Denies nausea and diarrhea. Hasn't been eating well recently since he doesn't want to exacerbate kidney and fluid status. Encouraged PO intake today.   Got hydralazine and labetalol overnight for BP. Reported labetalol caused headache.     Review of Systems (Focused)     Negative for SOB, abd pain, nausea, diarrhea       Physical Exam     Temp:  [97.3 F (36.3 C)-98.7 F (37.1 C)] 97.9 F (36.6 C)  Heart Rate:  [81-91] 81  Resp:  [9-18] 18  BP: (129-171)/(91-111) 165/106    Physical Exam  Cardiovascular:      Rate and Rhythm: Normal rate and regular rhythm.      Heart sounds: Normal heart sounds.   Pulmonary:      Breath sounds: Normal breath sounds.   Abdominal:      General: Bowel sounds are normal.      Palpations: Abdomen is soft.      Tenderness: There is no abdominal tenderness.   Neurological:      Mental Status: Mental status is at baseline.   Psychiatric:         Mood and Affect: Mood normal.         Behavior: Behavior normal.         Diagnostic Studies     Labs: Significant for: Cr 13.51 -> 15.11    No new imaging to review    Assessment & Plan     Roberto Rice is a 37 y.o. 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     #AKI   #Kidney transplant in 2018  #Acute transplant rejection  Cr elevated from baseline 2.4-2.7 per OSH records. Etiologies include prerenal secondary to diarrhea and decreased intake vs ATN due to Bactrim vs acute rejection. Recent hospitalization in 06/2022 concerning for acute cellular rejection (biopsy proven at [facility]), treated with IVIG and steroids. Renal US shows normal appearance of transplanted kidney, no hydronephrosis. Renal bx with  IR 7/8 showed acute rejection. Vas cath (from OSH) in place for hemodialysis. Continues to have good UOP.   - transplant renal consulted  - tacro 5mg  AM/6mg  PM (goal level 6-8), cellcept 720mg  BID, pred 5mg    - holding Bactrim ppx given AKI, on atovaquone 1.5g   - UA, UPCR ordered   - IR consulted for tunneled dialysis catheter   - adeno PCR      #HTN  Transferred with hypertensive urgency on nitro gtt, now resolved. BP elevated to 150/100s.   - home nifedipine ER 60mg  BID   - hydralazine 20mg  PRN PO, labetalol 20mg  PRN IV     #Diarrhea, resolved   Pt presenting with 1 week of nonbloody diarrhea and decreased PO intake. Previously admitted for diarrhea in 07/2022 with C diff colonization and norovirus. Pt has remained afebrile, WBC wnl. Diarrhea has since resolved.   - Bcx NGTD   - transplant ID consult               - infectious workup negative so far    - no empiric ABX at  this time      #Chronic anemia of CKD - Hgb stable     Orders Placed This Encounter   Procedures    Diet renal     DVT prophylaxis: Subcutaneous heparin (prophylaxis)  Code Status: Full Code      [name], MD  10/02/2022 11:41 AM

## 2022-10-02 NOTE — Plan of Care (Signed)
Problem: Safety  Goal: Patient will be injury free during hospitalization  Description: Assess and monitor vitals signs, neurological status including  level of consciousness and orientation. Assess patient's risk for falls and  implement fall prevention plan of care and interventions per hospital policy.    Ensure arm band on, uncluttered walking paths in room, adequate room lighting,  call light and overbed table within reach, bed in low position, wheels locked,  side rails up per policy, and non-skid footwear provided.  Outcome: Progressing    Problem: Patient will remain free of falls  Goal: Universal Fall Precautions  Outcome: Progressing    Problem: Daily Care  Goal: Daily care needs are met  Description: Assess and monitor ability to perform self care and identify  potential discharge needs.  Outcome: Progressing

## 2022-10-02 NOTE — Telephone Encounter (Signed)
Patient called - stating that he is currently admitted to [facility] with creatine of 13 received HD today via CV line. Per patient he will be having a permacath placed the discharged.     Refer back documents recently sent to [name] in [place] on 09/28/22. Patient will need to be seen by a transplant center and will likely not be [name].     Recently treated for acute cell mediated rejection in May 2024. Received IV steroids x3 (08/05/22-08/07/22) IVIG x2 on 5/9 and 5/13 - patient refused third IVIG dose.     Last sen by transplant team on 08/22/22. Clinic appointment has been make for Monday 7/15 at 10 am. Instructed patient to contact coordinator if he will not be able to make his appointment. Verbalized understanding.     Electronically signed by: Atha Starks, RN 10/02/2022 3:41 PM
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
## 2022-10-02 NOTE — Plan of Care (Signed)
Hemodialysis Treatment Plan of Care Goal:   Patient will achieve appropriate dialysis treatment as evidenced by:   Achieved ordered dry weight and/or fluid removal with post weight being within 1.0 kg (L) of ordered UF goal.   Remained hemodynamically stable within ordered parameters and absence of adverse intradialytic symptoms  Achieve effective regulation of serum electrolyte levels and/or drug toxicity as indicated    Patient Tolerated Hemodialysis Procedure well    Hemodialysis Weights  Dry Weight:  (TBD)  Fluid removal goal: Run even; Keep MAP >65  Last Treatment Post Weight:  (unknown)  Pre Weight: 92.6 kg (204 lb 2.3 oz)  Pre Weight Source: Standing Scale  Weight: 92.6 kg (204 lb 2.3 oz)  Post Weight Source: Standing Scale    HD Post Treatment Vitals:  BP: (!) 151/97  Heart Rate: 84  Temp: 98 F (36.7 C)  Resp: 18    Fluid Net Fluid Removal Calculation (Any blood products given during HD treatment are accounted for in net fluid removal and will be recorded in I/O upon scanning)  Hemodialysis Output (mL): 400 mL  Other fluids given (mL): 0 mL (Do Not Include Blood transfusion)  Rinseback Volume (mL): 400 mL  *Net fluid removal (ml): 0 mL*    Delivered Dialysis Prescription:  Potassium (mEq/L): 3  Calcium (mEq/L): 2.5  Sodium (mEq/L): 138  Bicarbonate (mEq/L): 32  Blood Flow:300  Dialysate Flow: 600    Prescribed Treatment Time (minutes): 180  Duration of Treatment (minutes): 180 minutes    HD Access:LIJ   HD Access Function: good, +aspirates/flushes.   HD Catheter Dual Lumen (Vas Cath) Non-tunneled Left Internal Jugular-Proximal(Red) Lumen Status: Capped-Citrate Locked  HD Catheter Dual Lumen (Vas Cath) Non-tunneled Left Internal Jugular-Medial(Blue) Lumen Status: Capped-Citrate Locked  Post-Treatment procedures: Blood returned, Gentamicin/ 4% Citrate Dwell, Catheter clamped and capped    Needle Size: Not applicable       Lidocaine Used: N/A  Access Needle Placement / Position:  n/a    Other / Comments:   Tolerated Tx well. VSS. Ran even.       Was a [employer] Used for this Treatment?  N/A    Did patient meet fluid removal goal within 1.0 L of ordered UF?  NA    Was an order modification for fluid removal given?  No    Name of provider contacted to adjusted target fluid removal?  Provider Name or n/a: n/a      PRE-TX RN Report From: Vernona Rieger RN  POST-TX RN Report To: Vernona Rieger RN    PRE-TX CMU Contact: N/A  POST-TX CMU Contact: N/A    Hepatitis Status:  Hep B Core Total Ab: negative  Hep B Surface Ab: positive  Hepatitis B Surface Ag: negative  Machine Number: [id number]    Hemodialysis Meds:   Retacrit (Epoetin Alfa-epbx)  N/A  Zemplar (Paracalcitol)  N/A  Venofer (Iron Sucrose)  N/A      Reason for admission  Acute kidney injury (CMS-HCC) [N17.9]

## 2022-10-03 ENCOUNTER — Inpatient Hospital Stay: Admit: 2022-10-03 | Payer: PRIVATE HEALTH INSURANCE

## 2022-10-03 LAB — RENAL FUNCTION PANEL W/EGFR
Albumin: 3.4 g/dL — ABNORMAL LOW (ref 3.5–5.7)
Anion Gap: 13 mmol/L (ref 3–16)
BUN: 47 mg/dL — ABNORMAL HIGH (ref 7–25)
CO2: 21 mmol/L (ref 21–33)
Calcium: 9 mg/dL (ref 8.6–10.3)
Chloride: 100 mmol/L (ref 98–110)
Creatinine: 11.7 mg/dL — ABNORMAL HIGH (ref 0.60–1.30)
EGFR: 5
Glucose: 132 mg/dL — ABNORMAL HIGH (ref 70–100)
Osmolality, Calculated: 292 mOsm/kg (ref 278–305)
Phosphorus: 5.1 mg/dL — ABNORMAL HIGH (ref 2.1–4.7)
Potassium: 4.7 mmol/L (ref 3.5–5.3)
Sodium: 134 mmol/L (ref 133–146)

## 2022-10-03 LAB — CBC
Hematocrit: 28.7 % — ABNORMAL LOW (ref 38.5–50.0)
Hemoglobin: 9.6 g/dL — ABNORMAL LOW (ref 13.2–17.1)
MCH: 30.4 pg (ref 27.0–33.0)
MCHC: 33.4 g/dL (ref 32.0–36.0)
MCV: 91.1 fL (ref 80.0–100.0)
MPV: 7.5 fL (ref 7.5–11.5)
Platelets: 253 10*3/uL (ref 140–400)
RBC: 3.15 10*6/uL — ABNORMAL LOW (ref 4.20–5.80)
RDW: 14.4 % (ref 11.0–15.0)
WBC: 13.3 10*3/uL — ABNORMAL HIGH (ref 3.8–10.8)

## 2022-10-03 LAB — TACROLIMUS LEVEL: Tacrolimus (LC-MS): 7.7 ng/mL (ref 3.0–15.0)

## 2022-10-03 LAB — MAGNESIUM: Magnesium: 1.8 mg/dL (ref 1.5–2.5)

## 2022-10-03 MED ORDER — HYDROmorphone (DILAUDID) injection 0.3 mg
0.5 | Freq: Once | INTRAMUSCULAR | Status: AC
Start: 2022-10-03 — End: 2022-10-03
  Administered 2022-10-03: 14:00:00 via INTRAVENOUS

## 2022-10-03 MED ORDER — antithymocyte globulin (rabbit) 1.5 mg/kg = 150 mg, heparin (porcine) 1,000 unit/mL 1,000 Units, hydrocortisone sod succ (PF) (SOLU-CORTEF) 25 mg in sodium chloride 0.9 % 500 mL IVPB
100 | Freq: Once | INTRAMUSCULAR
Start: 2022-10-03 — End: 2022-10-03

## 2022-10-03 MED ORDER — hydrALAZINE (APRESOLINE) tablet 25 mg
25 | Freq: Three times a day (TID) | ORAL | Status: AC
Start: 2022-10-03 — End: 2022-10-05
  Administered 2022-10-03 – 2022-10-05 (×6): via ORAL

## 2022-10-03 MED ORDER — diphenhydrAMINE (BENADRYL) capsule 25 mg
25 | Freq: Once | ORAL
Start: 2022-10-03 — End: 2022-10-03

## 2022-10-03 MED ORDER — acetaminophen (TYLENOL) tablet 650 mg
325 | ORAL | Status: AC | PRN
Start: 2022-10-03 — End: 2022-10-10
  Administered 2022-10-03 – 2022-10-05 (×3): via ORAL

## 2022-10-03 MED ORDER — methylPREDNISolone sod suc(PF) (SOLU-medrol) SolR 125 mg
125 | Freq: Once | INTRAMUSCULAR
Start: 2022-10-03 — End: 2022-10-03

## 2022-10-03 MED ORDER — acetaminophen (TYLENOL) tablet 650 mg
325 | Freq: Once | ORAL
Start: 2022-10-03 — End: 2022-10-03

## 2022-10-03 MED FILL — HYDRALAZINE 20 MG/ML INJECTION SOLUTION: 20 20 mg/mL | INTRAMUSCULAR | Qty: 1

## 2022-10-03 MED FILL — PROMETHAZINE 25 MG/ML INJECTION SOLUTION: 25 25 mg/mL | INTRAMUSCULAR | Qty: 1

## 2022-10-03 MED FILL — TYLENOL 325 MG TABLET: 325 325 mg | ORAL | Qty: 2

## 2022-10-03 MED FILL — HYDRALAZINE 25 MG TABLET: 25 25 MG | ORAL | Qty: 1

## 2022-10-03 MED FILL — MEPRON 750 MG/5 ML ORAL SUSPENSION: 750 750 mg/5 mL | ORAL | Qty: 10

## 2022-10-03 MED FILL — THYMOGLOBULIN 25 MG INTRAVENOUS SOLUTION: 25 25 mg | INTRAVENOUS | Qty: 6

## 2022-10-03 MED FILL — SIMETHICONE 80 MG CHEWABLE TABLET: 80 80 MG | ORAL | Qty: 1

## 2022-10-03 MED FILL — NIFEDIPINE ER 60 MG TABLET,EXTENDED RELEASE 24 HR: 60 60 MG (OSM) | ORAL | Qty: 1

## 2022-10-03 MED FILL — TACROLIMUS 1 MG CAPSULE, IMMEDIATE-RELEASE: 1 1 MG | ORAL | Qty: 1

## 2022-10-03 MED FILL — SOLU-MEDROL (PF) 125 MG/2 ML SOLUTION FOR INJECTION: 125 125 mg/2 mL | INTRAMUSCULAR | Qty: 2

## 2022-10-03 MED FILL — MYFORTIC 360 MG TABLET,DELAYED RELEASE: 360 360 mg | ORAL | Qty: 2

## 2022-10-03 MED FILL — ONDANSETRON HCL (PF) 4 MG/2 ML INJECTION SOLUTION: 4 4 mg/2 mL | INTRAMUSCULAR | Qty: 2

## 2022-10-03 MED FILL — TACROLIMUS 5 MG CAPSULE, IMMEDIATE-RELEASE: 5 5 MG | ORAL | Qty: 1

## 2022-10-03 MED FILL — FAMOTIDINE 20 MG TABLET: 20 20 MG | ORAL | Qty: 1

## 2022-10-03 MED FILL — OXYCODONE 5 MG TABLET: 5 5 MG | ORAL | Qty: 1

## 2022-10-03 MED FILL — HYDROMORPHONE 0.5 MG/0.5 ML INJECTION SYRINGE: 0.5 0.5 mg/0.5 mL | INTRAMUSCULAR | Qty: 0.5

## 2022-10-03 NOTE — Progress Notes (Signed)
Department of Internal Medicine  Daily Progress Note  Roberto Rice  10/03/2022    Chief Complaint / Reason for Follow-Up     AKI (acute kidney injury) (CMS-HCC)       Interval History / Subjective     No acute events overnight. Pt with headache that started overnight after ATG infusion. Has had HA previously with ATG. Describes pressure mostly on bilateral forehead. Denies weakness/numbness. Pain has not responded to Tylenol, oxy. Reports hydralazine made HA worse. Also had some nausea and emesis, but resolved with Zofran and Phenergan. Had some bleeding around vas cath due to retching.     Review of Systems (Focused)     Positive for headache   Negative for SOB, abd pain, diarrhea       Physical Exam     Temp:  [97.9 F (36.6 C)-99 F (37.2 C)] 98.4 F (36.9 C)  Heart Rate:  [78-92] 78  Resp:  [16-18] 17  BP: (132-166)/(86-109) 165/100    Physical Exam  Cardiovascular:      Rate and Rhythm: Normal rate and regular rhythm.      Heart sounds: Normal heart sounds.   Pulmonary:      Breath sounds: Normal breath sounds.   Abdominal:      General: Bowel sounds are normal.      Palpations: Abdomen is soft.      Tenderness: There is no abdominal tenderness.   Neurological:      Mental Status: Mental status is at baseline.   Psychiatric:         Mood and Affect: Mood normal.         Behavior: Behavior normal.         Diagnostic Studies     Labs: Significant for: WBC 7.1 -> 13.3, likely steroid induced    No new imaging to review    Assessment & Plan     Roberto Rice is a 37 y.o. 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     #Headache  Pt developed headache after thymoglobulin infusion 7/9 with some blurry vision. Nothing has helped headache including heat/ice packs, Tylenol, opiates. Pt does have history of headaches with ATG infusions. Differential also includes hypertensive emergency given vision changes, increased ICP due to  steroids.   - CT head      #AKI   #Kidney transplant in 2018  #Acute transplant rejection  Cr elevated from baseline 2.4-2.7 per OSH records. Etiologies include prerenal secondary to diarrhea and decreased intake vs ATN due to Bactrim vs acute rejection. Recent hospitalization in 06/2022 concerning for acute cellular rejection (biopsy proven at [facility]), treated with IVIG and steroids. Renal US shows normal appearance of transplanted kidney, no hydronephrosis. Renal bx with IR 7/8 showed acute rejection. Vas cath (from OSH) in place for hemodialysis. Continues to have good UOP.   - transplant renal consulted  - tacro 5mg  AM/6mg  PM (goal level 6-8), cellcept 720mg  BID, pred 5mg    - holding Bactrim ppx given AKI, on atovaquone 1.5g   - ATG, methylpred for acute rejection   - IR consulted for tunneled dialysis catheter, plan for Friday  - adeno PCR      #HTN  Transferred with hypertensive urgency on nitro gtt, now resolved. BP elevated to 150/100s.   - home nifedipine ER 60mg  BID   - hydralazine PRN, labetalol PRN    #Diarrhea, resolved  Pt presenting with 1 week of nonbloody diarrhea and decreased PO intake. Previously admitted for diarrhea in 07/2022 with C diff colonization and norovirus. Pt has remained afebrile, WBC wnl. Diarrhea has since resolved.   - Bcx NGTD   - transplant ID consult               - infectious workup negative so far    - no empiric ABX at this time      #Chronic anemia of CKD - Hgb stable     Orders Placed This Encounter   Procedures    Diet renal     DVT prophylaxis: Subcutaneous heparin (prophylaxis)  Code Status: Full Code      [name], MD  10/03/2022 8:48 AM

## 2022-10-03 NOTE — Care Coordination-Inpatient (Signed)
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 difficile diarrhea, Hypertension, and Kidney transplant recipient.    PCP:  ATTENDING PROVIDER UNKNOWN    Home Pharmacy:    [facility]  [address]  [place] [address]  Phone: [phone]         Medical Insurance Coverage:  Payor: [facility] [place] / Plan: [facility] [place] MEDICAID / Product Type: Medicaid Mngd care /     Other Pertinent Information     SW received report from [name] and completed chart review. Patient is not medically ready to discharge at this time. IR for tunneled line scheduled for Friday. Still pending final plan for HD outpatient. SW sent referral to [facility] HD 7/9 and contacted today 7/10 for update. Delena Serve is located at [address], [address] [place], [place] [address] [phone]. Theda Sers [phone] at central intake reports that they cannot accept patient due to out of network. Patient has [employer] [place] MEDICAID. Will have to go to HD in [place].     SW will discuss further and explain that referral will need to be sent to [facility] at this time. Wife was provided food voucher $10 on 7/9 and 3 $10 [employer] gift cards 7/9.     Discharge Plan     Anticipated discharge plan:  Home with HD     Anticipated discharge date:  7/13     CM/SW will continue to follow and remain available for discharge planning needs.      Chapman Moss, MSW     Cell [phone]
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
## 2022-10-03 NOTE — Plan of Care (Signed)
Problem: Safety  Goal: Patient will be injury free during hospitalization  Description: Assess and monitor vitals signs, neurological status including level of consciousness and orientation. Assess patient's risk for falls and implement fall prevention plan of care and interventions per hospital policy.      Ensure arm band on, uncluttered walking paths in room, adequate room lighting, call light and overbed table within reach, bed in low position, wheels locked, side rails up per policy, and non-skid footwear provided.   Outcome: Progressing     Problem: Patient will remain free of falls  Goal: Universal Fall Precautions  Outcome: Progressing     Problem: Patient will remain free of injury d/t fall  Goal: High Fall Risk Precautions  Outcome: Progressing
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
## 2022-10-04 LAB — TACROLIMUS LEVEL: Tacrolimus (LC-MS): 8.4 ng/mL (ref 3.0–15.0)

## 2022-10-04 LAB — RENAL FUNCTION PANEL W/EGFR
Albumin: 3.1 g/dL — ABNORMAL LOW (ref 3.5–5.7)
Anion Gap: 14 mmol/L (ref 3–16)
BUN: 55 mg/dL — ABNORMAL HIGH (ref 7–25)
CO2: 20 mmol/L — ABNORMAL LOW (ref 21–33)
Calcium: 8.8 mg/dL (ref 8.6–10.3)
Chloride: 100 mmol/L (ref 98–110)
Creatinine: 13.59 mg/dL — ABNORMAL HIGH (ref 0.60–1.30)
EGFR: 4
Glucose: 95 mg/dL (ref 70–100)
Osmolality, Calculated: 293 mOsm/kg (ref 278–305)
Phosphorus: 5.8 mg/dL — ABNORMAL HIGH (ref 2.1–4.7)
Potassium: 4.4 mmol/L (ref 3.5–5.3)
Sodium: 134 mmol/L (ref 133–146)

## 2022-10-04 LAB — CBC
Hematocrit: 27.6 % — ABNORMAL LOW (ref 38.5–50.0)
Hemoglobin: 9.4 g/dL — ABNORMAL LOW (ref 13.2–17.1)
MCH: 31.1 pg (ref 27.0–33.0)
MCHC: 34.2 g/dL (ref 32.0–36.0)
MCV: 90.9 fL (ref 80.0–100.0)
MPV: 7.4 fL — ABNORMAL LOW (ref 7.5–11.5)
Platelets: 225 10*3/uL (ref 140–400)
RBC: 3.03 10*6/uL — ABNORMAL LOW (ref 4.20–5.80)
RDW: 14.4 % (ref 11.0–15.0)
WBC: 7.4 10*3/uL (ref 3.8–10.8)

## 2022-10-04 LAB — TRANSPLANT MONITOR PANEL
CD19 % B Cell: 87.6 % — ABNORMAL HIGH (ref 1.0–28.0)
CD19 Abs: 227 Cells/uL (ref 24–683)
CD3 %: 7.5 % — ABNORMAL LOW (ref 56.0–89.0)
CD3 Abs: 19 Cells/uL — ABNORMAL LOW (ref 870–2532)

## 2022-10-04 LAB — MAGNESIUM: Magnesium: 1.8 mg/dL (ref 1.5–2.5)

## 2022-10-04 MED ORDER — simethicone (MYLICON) chewable tablet 80 mg
80 | Freq: Four times a day (QID) | ORAL | PRN
Start: 2022-10-04 — End: 2022-10-10

## 2022-10-04 MED ORDER — valGANciclovir (VALCYTE) tablet 900 mg
450 | Freq: Every day | ORAL
Start: 2022-10-04 — End: 2022-10-04

## 2022-10-04 MED ORDER — acetaminophen (TYLENOL) tablet 650 mg
325 | Freq: Once | ORAL | Status: AC
Start: 2022-10-04 — End: 2022-10-04
  Administered 2022-10-04: 16:00:00 via ORAL

## 2022-10-04 MED ORDER — valGANciclovir (VALCYTE) 50 mg/mL oral solution 100 mg
50 | ORAL
Start: 2022-10-04 — End: 2022-10-10
  Administered 2022-10-05 – 2022-10-08 (×2): via ORAL

## 2022-10-04 MED ORDER — antithymocyte globulin (rabbit) 1.5 mg/kg = 150 mg, heparin (porcine) 1,000 unit/mL 1,000 Units, hydrocortisone sod succ (PF) (SOLU-CORTEF) 25 mg in sodium chloride 0.9 % 500 mL IVPB
100 | Freq: Once | INTRAMUSCULAR | Status: AC
Start: 2022-10-04 — End: 2022-10-04
  Administered 2022-10-04: 17:00:00 via INTRAVENOUS

## 2022-10-04 MED ORDER — torsemide (DEMADEX) tablet 80 mg
20 | Freq: Two times a day (BID) | ORAL
Start: 2022-10-04 — End: 2022-10-10
  Administered 2022-10-04 – 2022-10-06 (×5): via ORAL

## 2022-10-04 MED ORDER — methylPREDNISolone sod suc(PF) (SOLU-medrol) SolR 125 mg
125 | Freq: Once | INTRAMUSCULAR | Status: AC
Start: 2022-10-04 — End: 2022-10-04
  Administered 2022-10-04: 16:00:00 via INTRAVENOUS

## 2022-10-04 MED ORDER — valGANciclovir (VALCYTE) tablet 450 mg
450 | Freq: Every day | ORAL
Start: 2022-10-04 — End: 2022-10-04

## 2022-10-04 MED ORDER — carvediloL (COREG) tablet 6.25 mg
6.25 | Freq: Two times a day (BID) | ORAL
Start: 2022-10-04 — End: 2022-10-05
  Administered 2022-10-04 – 2022-10-05 (×3): via ORAL

## 2022-10-04 MED ORDER — diphenhydrAMINE (BENADRYL) capsule 25 mg
25 | Freq: Once | ORAL | Status: AC
Start: 2022-10-04 — End: 2022-10-04
  Administered 2022-10-04: 16:00:00 via ORAL

## 2022-10-04 MED FILL — NIFEDIPINE ER 60 MG TABLET,EXTENDED RELEASE 24 HR: 60 60 MG (OSM) | ORAL | Qty: 1

## 2022-10-04 MED FILL — HEPARIN (PORCINE) 5,000 UNIT/ML INJECTION SOLUTION: 5000 5,000 unit/mL | INTRAMUSCULAR | Qty: 1

## 2022-10-04 MED FILL — TORSEMIDE 20 MG TABLET: 20 20 MG | ORAL | Qty: 4

## 2022-10-04 MED FILL — TACROLIMUS 5 MG CAPSULE, IMMEDIATE-RELEASE: 5 5 MG | ORAL | Qty: 1

## 2022-10-04 MED FILL — SOLU-MEDROL (PF) 125 MG/2 ML SOLUTION FOR INJECTION: 125 125 mg/2 mL | INTRAMUSCULAR | Qty: 2

## 2022-10-04 MED FILL — MYFORTIC 360 MG TABLET,DELAYED RELEASE: 360 360 mg | ORAL | Qty: 2

## 2022-10-04 MED FILL — THYMOGLOBULIN 25 MG INTRAVENOUS SOLUTION: 25 25 mg | INTRAVENOUS | Qty: 6

## 2022-10-04 MED FILL — DIPHENHYDRAMINE 25 MG CAPSULE: 25 25 mg | ORAL | Qty: 1

## 2022-10-04 MED FILL — SIMETHICONE 80 MG CHEWABLE TABLET: 80 80 MG | ORAL | Qty: 1

## 2022-10-04 MED FILL — MEPRON 750 MG/5 ML ORAL SUSPENSION: 750 750 mg/5 mL | ORAL | Qty: 10

## 2022-10-04 MED FILL — FAMOTIDINE 20 MG TABLET: 20 20 MG | ORAL | Qty: 1

## 2022-10-04 MED FILL — HYDRALAZINE 25 MG TABLET: 25 25 MG | ORAL | Qty: 1

## 2022-10-04 MED FILL — CARVEDILOL 6.25 MG TABLET: 6.25 6.25 MG | ORAL | Qty: 1

## 2022-10-04 MED FILL — TACROLIMUS 1 MG CAPSULE, IMMEDIATE-RELEASE: 1 1 MG | ORAL | Qty: 1

## 2022-10-04 MED FILL — TYLENOL 325 MG TABLET: 325 325 mg | ORAL | Qty: 2

## 2022-10-04 NOTE — Care Coordination-Inpatient (Addendum)
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 difficile diarrhea, Hypertension, and Kidney transplant recipient.    PCP:  ATTENDING PROVIDER UNKNOWN    Home Pharmacy:    [facility]  [address]  [place] [address]  Phone: [phone]         Medical Insurance Coverage:  Payor: [facility] [place] / Plan: [facility] [place] MEDICAID / Product Type: Medicaid [place] care /     Other Pertinent Information     SW received report from [name] and completed chart review. Patient is not medically ready to discharge at this time. IR for tunneled line scheduled for Friday. Still pending final plan for HD outpatient. SW sent referral to [facility] HD 7/9 and contacted  7/10 for update. [facility] is located at [address] [place], [place] [address] [phone]. Theda Sers [phone] at central intake reports that they cannot accept patient due to out of network. Patient has [employer] [facility] [place] MEDICAID. Will have to go to HD in [place].     Wife was provided food voucher $10 on 7/9 and 3 $10 [employer] gift cards 7/9.     SW met with patient at bedside to discuss [facility] not accepting and that [facility] is the only local agency in his area. SW left list of dialysis clinics near [place] at bedside. Patient sleeping and would not engage.   SW contacted wife- Bjorn Loser to discuss further. Wife reports that she has driven home to be with children and is currently at home. SW updated that [facility] is indeed not able to accept. Rhonda requesting referral to be sent to [facility]- located on [address]     Referral sent in EPIC. SW contacted central intake  and confirmed that referral was received 7/11. [name] has been assigned to review referral [phone].       SW received call from [facility] Intake and they said that patient has been accepted. MWF 2nd shift @12 :10PM. Requesting for H&P, hep B panel, and med list to be faxed to [phone]. SW sent fax.       [facility]  [address]  [place], [place] [address]  [phone]    Discharge Plan     Anticipated discharge plan:  Home with HD     Anticipated discharge date:  7/15     CM/SW will continue to follow and remain available for discharge planning needs.      Chapman Moss, MSW     Cell [phone]

## 2022-10-04 NOTE — Progress Notes (Signed)
Department of Internal Medicine  Daily Progress Note  Roberto Rice  10/04/2022    Chief Complaint / Reason for Follow-Up     AKI (acute kidney injury) (CMS-HCC)       Interval History / Subjective     No acute events overnight. Continues to have headache and blurry vision, which he states feels like his usual migraines. Also had some nausea and emesis overnight. He usually doesn't take anything for migraines.       Review of Systems (Focused)     Positive for headache and blurry vision   Negative for SOB, abd pain, diarrhea       Physical Exam     Temp:  [97.9 F (36.6 C)-99.1 F (37.3 C)] 99 F (37.2 C)  Heart Rate:  [79-103] 83  Resp:  [16-19] 18  BP: (130-163)/(79-116) 141/82    Physical Exam  Cardiovascular:      Rate and Rhythm: Normal rate and regular rhythm.      Heart sounds: Normal heart sounds.   Pulmonary:      Breath sounds: Normal breath sounds.   Abdominal:      General: Bowel sounds are normal.      Palpations: Abdomen is soft.      Tenderness: There is no abdominal tenderness.   Neurological:      Mental Status: Mental status is at baseline.   Psychiatric:         Mood and Affect: Mood normal.         Behavior: Behavior normal.         Diagnostic Studies     Labs: I have personally reviewed labs.    Significant for: CT head negative for acute lesions     Assessment & Plan     Roberto Rice is a 37 y.o. 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     #Headache  Pt developed headache after thymoglobulin infusion 7/9 with some blurry vision. He reports HA feels like his typical migraines. Nothing has helped headache including heat/ice packs, Tylenol, opiates. Pt does have history of headaches with ATG infusions. Differential also includes hypertensive emergency given vision changes, increased ICP due to steroids.   - CT head negative for acute lesions     #AKI   #Kidney transplant in 2018  #Acute transplant  rejection  Cr elevated from baseline 2.4-2.7 per OSH records. Etiologies include prerenal secondary to diarrhea and decreased intake vs ATN due to Bactrim vs acute rejection. Recent hospitalization in 06/2022 concerning for acute cellular rejection (biopsy proven at [facility]), treated with IVIG and steroids. Renal US shows normal appearance of transplanted kidney, no hydronephrosis. Renal bx with IR 7/8 showed acute rejection. Vas cath (from OSH) in place for hemodialysis. Continues to have good UOP.   - transplant renal consulted  - tacro 5mg  AM/6mg  PM (goal level 6-8), cellcept 720mg  BID, pred 5mg    - ATG, methylpred for acute rejection   - Valcyte ppx 100mg  oral solution MWF while on thymo. Holding Bactrim ppx given AKI, on atovaquone 1.5g   - torsemide 80mg  BID   - IR consulted for tunneled dialysis catheter, plan for Friday  - adeno PCR      #HTN  Transferred with hypertensive urgency on nitro gtt, now resolved. BP elevated to 150/100s.   - goal BP 120/80s per transplant renal   - nifedipine ER 60mg  BID,  hydralazine 25mg  TID, coreg 6.25 BID   - hydralazine PRN    #Diarrhea, resolved   Pt presenting with 1 week of nonbloody diarrhea and decreased PO intake. Previously admitted for diarrhea in 07/2022 with C diff colonization and norovirus. Pt has remained afebrile, WBC wnl. Diarrhea has since resolved.   - Bcx NGTD   - transplant ID consult               - infectious workup negative so far    - no empiric ABX at this time      #Chronic anemia of CKD - Hgb stable     Orders Placed This Encounter   Procedures    Diet renal     DVT prophylaxis: Subcutaneous heparin (prophylaxis)  Code Status: Full Code      [name], MD  10/04/2022 11:07 AM

## 2022-10-04 NOTE — Progress Notes (Signed)
Brief ID Progress Note  Patient briefly seen, but sleeping at time of encounter. Chart checked. All stool studies came back negative including ova/p, Giardia/Cryptosporidium antigens. Enteric pathogen panel negative. Strongy ab negative. No more diarrhea document in chart. Noted renal biopsy prelim results showing acute rejection. Primary and transplant teams following. On atovaquone, valgancyclovir prophylaxis. ID will sign off at this time. Call us back if any questions. Thank you.
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
## 2022-10-05 ENCOUNTER — Inpatient Hospital Stay: Payer: PRIVATE HEALTH INSURANCE

## 2022-10-05 LAB — CBC
Hematocrit: 29.4 % — ABNORMAL LOW (ref 38.5–50.0)
Hemoglobin: 10.1 g/dL — ABNORMAL LOW (ref 13.2–17.1)
MCH: 31.6 pg (ref 27.0–33.0)
MCHC: 34.2 g/dL (ref 32.0–36.0)
MCV: 92.4 fL (ref 80.0–100.0)
MPV: 8 fL (ref 7.5–11.5)
Platelets: 240 10*3/uL (ref 140–400)
RBC: 3.19 10*6/uL — ABNORMAL LOW (ref 4.20–5.80)
RDW: 14.6 % (ref 11.0–15.0)
WBC: 4.8 10*3/uL (ref 3.8–10.8)

## 2022-10-05 LAB — TRANSPLANT MONITOR PANEL
CD19 % B Cell: 84.2 % — ABNORMAL HIGH (ref 1.0–28.0)
CD19 Abs: 131 Cells/uL (ref 24–683)
CD3 %: 3.7 % — ABNORMAL LOW (ref 56.0–89.0)
CD3 Abs: 6 Cells/uL — ABNORMAL LOW (ref 870–2532)

## 2022-10-05 LAB — RENAL FUNCTION PANEL W/EGFR
Albumin: 3.5 g/dL (ref 3.5–5.7)
Anion Gap: 17 mmol/L — ABNORMAL HIGH (ref 3–16)
BUN: 69 mg/dL — ABNORMAL HIGH (ref 7–25)
CO2: 17 mmol/L — ABNORMAL LOW (ref 21–33)
Calcium: 9.2 mg/dL (ref 8.6–10.3)
Chloride: 100 mmol/L (ref 98–110)
Creatinine: 14.88 mg/dL — ABNORMAL HIGH (ref 0.60–1.30)
EGFR: 4
Glucose: 102 mg/dL — ABNORMAL HIGH (ref 70–100)
Osmolality, Calculated: 298 mOsm/kg (ref 278–305)
Phosphorus: 6.2 mg/dL — ABNORMAL HIGH (ref 2.1–4.7)
Potassium: 5.1 mmol/L (ref 3.5–5.3)
Sodium: 134 mmol/L (ref 133–146)

## 2022-10-05 LAB — CLOSTRIDIUM DIFFICILE DNA AMPLIFICATION: Clost. Diff DNA Amp.: POSITIVE — AB

## 2022-10-05 LAB — CLOSTRIDIUM DIFFICILE TOXIN A/B ANTIGEN: CDIFF Tox AGN: NEGATIVE

## 2022-10-05 LAB — MAGNESIUM: Magnesium: 1.9 mg/dL (ref 1.5–2.5)

## 2022-10-05 LAB — TACROLIMUS LEVEL: Tacrolimus (LC-MS): 7.9 ng/mL (ref 3.0–15.0)

## 2022-10-05 MED ORDER — carvediloL (COREG) tablet 12.5 mg
6.25 | Freq: Two times a day (BID) | ORAL
Start: 2022-10-05 — End: 2022-10-10
  Administered 2022-10-06 – 2022-10-10 (×9): via ORAL

## 2022-10-05 MED FILL — MYFORTIC 360 MG TABLET,DELAYED RELEASE: 360 360 mg | ORAL | Qty: 2

## 2022-10-05 MED FILL — TORSEMIDE 20 MG TABLET: 20 20 MG | ORAL | Qty: 4

## 2022-10-05 MED FILL — NIFEDIPINE ER 60 MG TABLET,EXTENDED RELEASE 24 HR: 60 60 MG (OSM) | ORAL | Qty: 1

## 2022-10-05 MED FILL — HEPARIN (PORCINE) 5,000 UNIT/ML INJECTION SOLUTION: 5000 5,000 unit/mL | INTRAMUSCULAR | Qty: 1

## 2022-10-05 MED FILL — CARVEDILOL 6.25 MG TABLET: 6.25 6.25 MG | ORAL | Qty: 1

## 2022-10-05 MED FILL — VALGANCICLOVIR 50 MG/ML ORAL SOLUTION: 50 50 mg/mL | ORAL | Qty: 9

## 2022-10-05 MED FILL — TACROLIMUS 5 MG CAPSULE, IMMEDIATE-RELEASE: 5 5 MG | ORAL | Qty: 1

## 2022-10-05 MED FILL — TACROLIMUS 1 MG CAPSULE, IMMEDIATE-RELEASE: 1 1 MG | ORAL | Qty: 1

## 2022-10-05 MED FILL — VALGANCICLOVIR 50 MG/ML ORAL SOLUTION: 50 50 mg/mL | ORAL | Qty: 4.5

## 2022-10-05 MED FILL — MEPRON 750 MG/5 ML ORAL SUSPENSION: 750 750 mg/5 mL | ORAL | Qty: 10

## 2022-10-05 MED FILL — ONDANSETRON HCL (PF) 4 MG/2 ML INJECTION SOLUTION: 4 4 mg/2 mL | INTRAMUSCULAR | Qty: 2
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
## 2022-10-05 NOTE — Progress Notes (Signed)
[facility]  Medical Nutrition Therapy    Reason(s) for Completion: Nutrition Services Protocol-LOS    Diet Order/Nutrition Support: Renal    Pertinent Information: Roberto Rice, a 37 y.o. 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 Pt reports diarrhea for about 1 week with decreased PO intake. Pt with infectious diarrhea.     Pt is on HD# 6. Pt w/ AKI, creatinine currently 14.88 (baseline 2.7-2.7). Pt was sleeping soundly at time of attempted visit. Pt PO intake 50-100% and fair-good appetite per flowsheet and is ordering 1-3 meals/day per CBORD. Last +BM 7/7. UOP 925 ml over the last 24 hours.     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 difficile diarrhea     Hypertension     Kidney transplant recipient      Scheduled Meds:    atovaquone  1,500 mg Oral DAILYWM    carvediloL  6.25 mg Oral BID    famotidine  20 mg Oral Daily 0900    heparin  5,000 Units Subcutaneous 3 times per day    mycophenolate  720 mg Oral BID    NIFEdipine  60 mg Oral BID    tacrolimus  5 mg Oral DAILY 0900    tacrolimus  6 mg Oral Nightly (2100)    torsemide  80 mg Oral BID (0900, 1700)    valGANciclovir  100 mg Oral Once per day on Monday Wednesday Friday      Continuous Infusions:   PRN Meds:acetaminophen, For Catheter Lock: gentamicin 0.32 mg/mL and citrate 4% antibiotic IV lock, hydrALAZINE, hydrALAZINE, ondansetron, oxyCODONE, proMETHazine, simethicone     Pertinent Labs:   Lab Results   Component Value Date    CREATININE 14.88 (H) 10/05/2022    BUN 69 (H) 10/05/2022    NA 134 10/05/2022    K 5.1 10/05/2022    CL 100  10/05/2022    CO2 17 (L) 10/05/2022     Lab Results   Component Value Date    CALCIUM 9.2 10/05/2022    PHOS 6.2 (H) 10/05/2022     Lab Results   Component Value Date    MG 1.9 10/05/2022     Lab Results   Component Value Date    GLUCOSE 102 (H) 10/05/2022     Lab Results   Component Value Date    WBC 4.8 10/05/2022     Lab Results   Component Value Date    ALBUMIN 3.5 10/05/2022     Temp (24hrs), Avg:98.6 F (37 C), Min:98.3 F (36.8 C), Max:99 F (37.2 C)    Skin Integrity: Braden Scale Score: 20  Skin: surgical incision on R abdomen    GI: Last BM Date: 09/30/22  Abdomen Inspection: Soft, Nondistended  Bowel Sounds (All Quadrants): Active, Present  Palpation/Percussion: Soft    Cultural Request: None    Edema: Generalized Edema: None  RUE Edema: None  LUE Edema: None  RLE Edema: None  LLE Edema: None    Potential Nutrition Related Factor(s):  Skin Integrity: surgical  incision on R abdomen, abdominal pain and infectious diarrhea  Food Allergies/Intolerances: NKFA  Social needs that may impact access to food: None noted    37 y.o.   Male   Ht Readings from Last 1 Encounters:   09/29/22 5' 11 (1.803 m)     Wt Readings from Last 25 Encounters:   10/02/22 204 lb 2.3 oz (92.6 kg)      Body mass index is 28.47 kg/m.   BMI Class: Pre Obese:  25 - 29.9  Ideal Body Weight (+/- 10%) 172 lb (78.2 kg)  Weight History: see above     Estimated Nutrition Needs   (based on clinical status at this time and subject to change):   Needs based On: 92.6 kg (CBW)  Kcals/day: 2040 - 2315 (22 - 25 kcal/kg)  Protein g/day: 74 - 93 (0.8 - 1.0 gm/kg)  Carbohydrate g/day: No hx of Diabetes  Fluid ml/day: 1 ml/kcal      Nutrition Related Problems:   Nutrition Diagnosis: Inadequate Oral Intake  Related To: appetite change and abdominal pain  As Evidenced By: PO intake 50-100% and 1-3 meals/day    Recommended Interventions: Add/Change Medical Food Supplement/Snack and Monitor PO Intake/Tolerance  Goals:Total energy intake improved as  evidenced by PO intake at least 75-100% of meals/supplements/snacks and at least 2 meals/day within 3-5 days                                                         Follow up per policy while inpatient     Nutrition Transition of Care Plan: Discharge Plan of Care for nutrition (ongoing pending clinical course)    Recommendation(s):   RD to add ONS NovaSource Renal BID (provides 950 kcals and 44 g protein) to promote PO intake and aid meeting nutrient needs  Monitor PO intake, weight, nutrition-related labs, and POC    Phill Myron RD, LD  Clinical Dietitian  Please contact via Epic secure chat

## 2022-10-05 NOTE — Plan of Care (Signed)
Hemodialysis Treatment Plan of Care Goal:   Patient will achieve appropriate dialysis treatment as evidenced by:   Achieved ordered dry weight and/or fluid removal with post weight being within 1.0 kg (L) of ordered UF goal.   Remained hemodynamically stable within ordered parameters and absence of adverse intradialytic symptoms  Achieve effective regulation of serum electrolyte levels and/or drug toxicity as indicated    Patient Tolerated Hemodialysis Procedure well    Hemodialysis Weights  Dry Weight:  (TBD)  Fluid removal goal: 400  Last Treatment Post Weight:  (unknown)  Pre Weight: 93.7 kg (206 lb 9.1 oz)  Pre Weight Source: Standing Scale  Weight: 93.7 kg (206 lb 9.1 oz)  Post Weight Source: Standing Scale    HD Post Treatment Vitals:  BP: (!) 136/91  Heart Rate: 83  Temp: 97 F (36.1 C)  Resp: 16    Fluid Net Fluid Removal Calculation (Any blood products given during HD treatment are accounted for in net fluid removal and will be recorded in I/O upon scanning)  Hemodialysis Output (mL): 400 mL  Other fluids given (mL): 0 mL (Do Not Include Blood transfusion)  Rinseback Volume (mL): 400 mL  *Net fluid removal (ml): 0 mL (per MD order)*    Delivered Dialysis Prescription:  Potassium (mEq/L): 2  Calcium (mEq/L): 2.5  Sodium (mEq/L): 138  Bicarbonate (mEq/L): 30  Blood Flow: 300   Dialysate Flow: 600    Prescribed Treatment Time (minutes): 180  Duration of Treatment (minutes): 176 minutes    HD Access:Left temporary IJ - ran reverse due to sluggish arterial pull  HD Access Function:   HD Catheter Dual Lumen (Vas Cath) Non-tunneled Left Internal Jugular-Proximal(Red) Lumen Status: Capped-Citrate Locked  HD Catheter Dual Lumen (Vas Cath) Non-tunneled Left Internal Jugular-Medial(Blue) Lumen Status: Capped-Citrate Locked  Post-Treatment procedures: Blood returned, Gentamicin/ 4% Citrate Dwell, Catheter clamped and capped    Needle Size: Not applicable       Lidocaine Used: N/A  Access Needle Placement / Position:   n/a    Other / Comments:  Pt tolerated 3H hd tx well with no UF per MD orders. Pt did start to clot at end of tx with 4 min left- only able to return arterial line and half of venous line due to large amount of clotting in venous chamber. No acute distress noted. Cvc dressing CDI without s/s infection. Pt calm and cooperative with care this HD tx.       Was a [employer] Used for this Treatment?  N/A    Did patient meet fluid removal goal within 1.0 L of ordered UF?  Yes    Was an order modification for fluid removal given?  NA    Name of provider contacted to adjusted target fluid removal?  Provider Name or n/a: n/a      PRE-TX RN Report From: Kriste Basque, RN  POST-TX RN Report To: Misty, RN    PRE-TX CMU Contact: n/a  POST-TX CMU Contact: n/a    Hepatitis Status:  Hep B Core Total Ab: negative  Hep B Surface Ab: positive  Hepatitis B Surface Ag: negative  Machine Number: [id number]    Hemodialysis Meds:   Retacrit (Epoetin Alfa-epbx)  N/A  Zemplar (Paracalcitol)  No, N/A  Venofer (Iron Sucrose)  N/A      Reason for admission  Acute kidney injury (CMS-HCC) [N17.9]

## 2022-10-05 NOTE — Progress Notes (Signed)
Department of Internal Medicine  Daily Progress Note  Roberto Rice  10/05/2022    Chief Complaint / Reason for Follow-Up     AKI (acute kidney injury) (CMS-HCC)       Interval History / Subjective     No acute events overnight. Pt got thymo yesterday, continues to have HA with nausea. Also reports watery diarrhea overnight with abdominal pain. He describes feeling shaky/tingling and chills, which he gets when he is due for dialysis. Plan for dialysis today.   IR tunneled line cancelled this AM due to diarrhea and c/f infection.     Review of Systems (Focused)     Positive for headache, nausea, diarrhea, abd pain      Physical Exam     Temp:  [98.3 F (36.8 C)-99 F (37.2 C)] 98.3 F (36.8 C)  Heart Rate:  [83-95] 87  Resp:  [16-18] 18  BP: (134-148)/(75-89) 138/82    Physical Exam  Cardiovascular:      Rate and Rhythm: Normal rate and regular rhythm.      Heart sounds: Normal heart sounds.   Pulmonary:      Breath sounds: Normal breath sounds.   Abdominal:      General: Bowel sounds are normal.      Palpations: Abdomen is soft.      Tenderness: There is abdominal tenderness in the epigastric area.   Neurological:      Mental Status: Mental status is at baseline.   Psychiatric:         Mood and Affect: Mood normal.         Behavior: Behavior normal.         Diagnostic Studies     Labs: Significant for: Cr 13.59 -> 14.88    No new imaging to review    Assessment & Plan     Roberto Rice is a 37 y.o. 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     #Headache  Pt developed headache after thymoglobulin infusion 7/9 with some blurry vision. He reports HA feels like his typical migraines. Nothing has helped headache including heat/ice packs, Tylenol, opiates. Pt does have history of headaches with ATG infusions. Differential also includes hypertensive emergency given vision changes, increased ICP due to steroids.   - CT head negative  for acute lesions     #Diarrhea  Pt presenting with 1 week of nonbloody diarrhea and decreased PO intake. Previously admitted for diarrhea in 07/2022 with C diff colonization and norovirus. Pt has remained afebrile, WBC wnl. Diarrhea improved over admission, however restarted overnight 7/11-12.   - C diff ordered   - transplant ID consulted, infectious workup negative   - consult GI     #AKI   #Kidney transplant in 2018  #Acute transplant rejection  Cr elevated from baseline 2.4-2.7 per OSH records. Etiologies include prerenal secondary to diarrhea and decreased intake vs ATN due to Bactrim vs acute rejection. Renal bx with IR 7/8 showed acute rejection. Vas cath (from OSH) in place for hemodialysis. Continues to have good UOP.   - transplant renal consulted  - tacro 5mg  AM/6mg  PM (goal level 6-8), cellcept 720mg  BID, pred 5mg    - ATG, methylpred for acute rejection   - Valcyte ppx 100mg  oral solution MWF while on thymo. Holding Bactrim ppx given AKI, on atovaquone 1.5g   - torsemide 80mg  BID   - IR consulted  for tunneled dialysis catheter  - hold given c/f infectious diarrhea   - adeno PCR      #HTN  Transferred with hypertensive urgency on nitro gtt, now resolved. BP elevated to 150/100s.   - goal BP 120/80s per transplant renal   - nifedipine ER 60mg  BID, coreg 6.25 BID   - hydralazine PRN    #Chronic anemia of CKD - Hgb stable     Orders Placed This Encounter   Procedures    Diet renal     DVT prophylaxis: Subcutaneous heparin (prophylaxis)  Code Status: Full Code      Roberto Austin, MD  10/05/2022 11:26 AM

## 2022-10-05 NOTE — Care Coordination-Inpatient (Addendum)
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 difficile diarrhea, Hypertension, and Kidney transplant recipient.    PCP:  ATTENDING PROVIDER UNKNOWN    Home Pharmacy:    [facility]  [address]  [place] [address]  Phone: [phone]         Medical Insurance Coverage:  Payor: [facility] OF [place] / Plan: [facility] OF [place] MEDICAID / Product Type: Medicaid Mngd care /     Other Pertinent Information     SW received report from [name] and completed chart review. Patient is not medically ready to discharge at this time, not until next week. IR for tunneled line scheduled for for today and cancelled. Will need to be rescheduled for next week.      Referral sent in EPIC to [facility] 7/11. SW contacted central intake and confirmed that referral was received 7/11. [name] has been assigned to review referral [phone] ext [phone].      SW received call from [facility] [employer] [facility] and they said that patient has been accepted. MWF 2nd shift @11 :50AM. Requesting for H&P, hep B panel, and med list to be faxed to [phone]. SW sent fax.      [facility]  [address]  [place], [place] [address]  [phone]    SW received call from the house HD clinic - [name] [phone] stating that she received request and will need to confirm with director when they will have an available start date next week.     [name] called back stating that bed will be open on Wed. Will update on Monday.     Discharge Plan     Anticipated discharge plan:  Home HD      Anticipated discharge date:  7/17     CM/SW will continue to follow and remain  available for discharge planning needs.      Chapman Moss, MSW     Cell [phone]

## 2022-10-05 NOTE — Progress Notes (Addendum)
 Pt refused HS & AM  sub Q Heparin.

## 2022-10-05 NOTE — Consults (Signed)
[facility]  GI CONSULT NOTE    Name: Ragen Macarthur  MRN: [id number]  CSN: [id number]    Consulted by: Emeterio Reeve, MD    Reason for Consult: nausea, vomiting, diarrhea in post-transplant patient    History of Present Illness:  Roberto Rice is a 37 y.o. 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 Also reporting vomiting, diarrhea, and decreased appetite at that time.    Initially presented to [facility] on 7/5 for AKI of transplanted kidney with fluid overload. Tansferred to [name] for higher level of care, however, left AMA. Then readmitted to [facility] on the same day due to development of altered mental status. Was admitted to the ICU and received HD for acute kidney failure in the setting of rejection. Also developed refractory hypertensive urgency requiring nitro gtt. Ultimately transferred to [facility] for higher level of care and management by renal transplant team. Currently receiving iHD.    Reporting a waxing and waning acute on chronic diarrhea, nausea/vomiting, and abdominal bloating for past 3 months. Initially developed hematochezia in the beginning of April and underwent colonoscopy at OSH on 4/7 with findings significant for a few small colonic AVMs that were coagulated and internal hemorrhoids. No biopsies taken at that time. Later presented to OSH on 4/24 with abdominal pain and diarrhea. CT A/P with rectal wall thickening possibly representing proctitis, however, not seen on colonoscopy the previous week. Diagnosed with acute T-cell mediated transplant rejection during that hospitalization, treated with IVIG and solumedrol 500 mg x3 days. Also with plan to start antithymocyte at that time, but patient refuses and left AMA. Per discharge summary, recommended to  follow PO steroid taper after discharge, however, patient denies taking this. Admitted to OSH again on 5/24 for AKI in setting of acute on chronic diarrhea, nausea/vomiting, and abdominal pain. CMV and EBV (-), norovirus (+), C diff colonization. Treated with 5 day course of Alinia. Also received IV dexamethasone 100 mg x3 days and IVIG. Plan to be discharged on half-dose Myfortic, tacro 5/6, and prednisone 20 mg daily.     Most recently has been experiencing a 1 week history of increased diarrhea and nausea with poor PO intake. Describes sharp epigastric pain, as well as mild pain in RLQ at site of transplanted kidneys. Reports 4 episodes of watery diarrhea a day, was initially improving during admission but has started to worsen again over the past day. Also reporting constant nausea and regular emesis, states he vomits at least once daily depending on what he eats. Has had some PO intolerance and states he is hesitant to eat or drink due to fear of toxifying his kidneys, though does recognize that poor PO intake can lead to kidney injury. Denies hematemesis, hematochezia, or melena.    Thus far, enteric pathogen panel, ova and parasites, and crypto negative. Recheck of C diff pending (colonization in May). Current anti-rejection regimen includes Myfortic 720 mg BID, tacrolimus 5 mg/6 mg, and antithymocyte. Also on atovaquone and valgancyclovir ppx. Has been receiving Zofran and Phenergan PRN with minimal benefit, no anti-diarrheals. No previous EGD. Reports a family history of peptic ulcer disease.      Review of Systems   Complete 12 point ROS negative except as above.    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 difficile diarrhea     Hypertension     Kidney transplant recipient         Past Surgical History:   Procedure Laterality Date    NEPHRECTOMY TRANSPLANTED ORGAN          No family history on file.     Social History     Tobacco Use     Smoking status: Never    Smokeless tobacco: Never   Substance Use Topics    Alcohol use: Not on file        No Known Allergies     Scheduled Meds:   atovaquone  1,500 mg Oral DAILYWM    carvediloL  12.5 mg Oral BID    famotidine  20 mg Oral Daily 0900    heparin  5,000 Units Subcutaneous 3 times per day    mycophenolate  720 mg Oral BID    NIFEdipine  60 mg Oral BID    tacrolimus  5 mg Oral DAILY 0900    tacrolimus  6 mg Oral Nightly (2100)    torsemide  80 mg Oral BID (0900, 1700)    valGANciclovir  100 mg Oral Once per day on Monday Wednesday Friday     Continuous Infusions:    PRN Meds:  acetaminophen, For Catheter Lock: gentamicin 0.32 mg/mL and citrate 4% antibiotic IV lock, hydrALAZINE, hydrALAZINE, ondansetron, oxyCODONE, proMETHazine, simethicone    Prior to Admission Meds:  Medications Prior to Admission   Medication Sig Dispense Refill Last Dose    isosorbide mononitrate (IMDUR) 30 MG 24 hr tablet Take 1 tablet (30 mg total) by mouth daily.       mycophenolate (MYFORTIC) 180 MG EC tablet Take 3 tablets (540 mg total) by mouth 2 times a day.       NIFEdipine (PROCARDIA-XL) 60 MG (OSM) 24 hr tablet Take 1 tablet (60 mg total) by mouth 2 times a day.       potassium chloride (KLOR-CON M20) 20 MEQ tablet Take 2 tablets (40 mEq total) by mouth daily.       sulfamethoxazole-trimethoprim (BACTRIM) 400-80 mg per tablet Take 1 tablet by mouth every Monday, Wednesday, and Friday.       tacrolimus (PROGRAF) 1 MG capsule Take 6 capsules (6 mg total) by mouth at bedtime.       cholecalciferol, vitamin D3, 1000 units tablet Take 2 tablets (2,000 Units total) by mouth daily.       predniSONE (DELTASONE) 10 MG tablet Take 1 tablet (10 mg total) by mouth daily.       tacrolimus (PROGRAF) 5 MG capsule Take 1 capsule (5 mg total) by mouth every morning.          Vitals:  Temp:  [98.3 F (36.8 C)-99 F (37.2 C)] 98.7 F (37.1 C)  Heart Rate:  [83-95] 87  Resp:  [16-18] 18  BP: (134-147)/(75-89) 138/82    Intake/Output  Summary (Last 24 hours) at 10/05/2022 1523  Last data filed at 10/05/2022 1513  Gross per 24 hour   Intake 945 ml   Output 775 ml   Net 170 ml       Physical Exam   Gen: Well-developed, no distress.  HEENT:. Mucous membranes moist. No scleral icterus.   CV: Regular rate and rhythm.   Lungs: Nonlabored breathing.  Abdomen: Diffusely tender to palpation over RUQ, epigastrium, and LUQ. Voluntary guarding. Soft,  nondistended. No ascites present.  Extremities: No bilateral lower extremity edema.   Skin: No bruising or rash noted. No spider angiomata or palmar erythema.   Neuro: Alert and oriented to person, place, and time. No focal deficits  Psych: Flat affect. Normal speech and behavior.     Laboratory:  Lab Results   Component Value Date    WBC 4.8 10/05/2022    HGB 10.1 (L) 10/05/2022    HCT 29.4 (L) 10/05/2022    MCV 92.4 10/05/2022    PLT 240 10/05/2022     Lab Results   Component Value Date    NA 134 10/05/2022    K 5.1 10/05/2022    CL 100 10/05/2022    CO2 17 (L) 10/05/2022    BUN 69 (H) 10/05/2022    CREATININE 14.88 (H) 10/05/2022    GLUCOSE 102 (H) 10/05/2022    CALCIUM 9.2 10/05/2022    PHOS 6.2 (H) 10/05/2022     Lab Results   Component Value Date    AST 13 09/30/2022    ALT 8 09/30/2022    BILITOT 0.7 09/30/2022    BILIDIRECT 0.13 09/30/2022    PROT 7.6 09/30/2022    ALBUMIN 3.5 10/05/2022    ALKPHOS 76 09/30/2022     Lab Results   Component Value Date    INR 1.1 09/30/2022        No results found for: AMYLASE  No results found for: LIPASE    Lab Results   Component Value Date    HEPBCAB Nonreactive 10/01/2022     No results found for: ANA  No results found for: SMOOTHMUSCAB  No results found for: IRON, TIBC, FERRITIN  No results found for: OCCULTBLD    No results found for: AFP  No results found for: CEA  No results found for: CA125    No results found for: VITAMINB12  No results found for: FOLATE    Microbiology:   No results found for: AEROBOT, ANABOT, LABGRAM, ISO2,  ISO3, ISO4, ISO5, ISO6, PNAFISH    Images:    Assessment/Plan:    Patient with acute on chronic diarrhea, nausea/vomiting, and abdominal bloating in renal transplant patient coinciding with acute T-cell mediated rejection. Can consider medication-induced with mycophenolate, however, is currently on Myfortic which is less likely to cause diarrhea. Can also consider celiacs based on symptomatology, though may be less likely with only recent onset and no direct association with diet. Has recently been receiving steroids for acute rejection, which could potentially lead to adrenal insufficiency causing nausea/vomiting and abdominal pain, though would not explain diarrhea. CMV in transplant patient can also cause current presentation and would need to be r/o with biopsy.    Recommendations:  - Plan for EGD and flex sigmoidoscopy on Monday, needs to be NPO at midnight on Sunday will need tap water enema x2 Monday morning  - Celiac serologies - total IgA, TTG IgA  - Consider AM cortisol with ACTH stimulation test if cortisol low - recent use of high dose steroids in setting of acute rejection, possible adrenal insufficiency  - Schedule Zofran 4-8 mg q8h, promethazine or prochlorperazine PRN  - Low dose loperamide with r/o of infectious etiology, can slowly increase dose PRN    Case to be discussed with Dr. Allison Quarry. Recommendations final after attending attestation.    Liliya Fullenwider, MS4  10/05/2022, 3:23 PM

## 2022-10-06 LAB — CBC
Hematocrit: 28.2 % — ABNORMAL LOW (ref 38.5–50.0)
Hemoglobin: 9.7 g/dL — ABNORMAL LOW (ref 13.2–17.1)
MCH: 31.2 pg (ref 27.0–33.0)
MCHC: 34.2 g/dL (ref 32.0–36.0)
MCV: 91 fL (ref 80.0–100.0)
MPV: 7.8 fL (ref 7.5–11.5)
Platelets: 230 10*3/uL (ref 140–400)
RBC: 3.1 10*6/uL — ABNORMAL LOW (ref 4.20–5.80)
RDW: 14.6 % (ref 11.0–15.0)
WBC: 5.3 10*3/uL (ref 3.8–10.8)

## 2022-10-06 LAB — MAGNESIUM: Magnesium: 1.8 mg/dL (ref 1.5–2.5)

## 2022-10-06 LAB — RENAL FUNCTION PANEL W/EGFR
Albumin: 3.3 g/dL — ABNORMAL LOW (ref 3.5–5.7)
Anion Gap: 11 mmol/L (ref 3–16)
BUN: 47 mg/dL — ABNORMAL HIGH (ref 7–25)
CO2: 22 mmol/L (ref 21–33)
Calcium: 8.6 mg/dL (ref 8.6–10.3)
Chloride: 101 mmol/L (ref 98–110)
Creatinine: 10.26 mg/dL — ABNORMAL HIGH (ref 0.60–1.30)
EGFR: 6
Glucose: 104 mg/dL — ABNORMAL HIGH (ref 70–100)
Osmolality, Calculated: 291 mOsm/kg (ref 278–305)
Phosphorus: 4.1 mg/dL (ref 2.1–4.7)
Potassium: 3.8 mmol/L (ref 3.5–5.3)
Sodium: 134 mmol/L (ref 133–146)

## 2022-10-06 LAB — TACROLIMUS LEVEL: Tacrolimus (LC-MS): 13.2 ng/mL (ref 3.0–15.0)

## 2022-10-06 LAB — ENDOMYSIAL ANTIBODY, IGA TITER: Endomysial IgA: NEGATIVE

## 2022-10-06 LAB — IGA: IgA: 129 mg/dL (ref 70.0–400.0)

## 2022-10-06 LAB — GLIADIN ANTIBODIES, SERUM
Gliadin IgA: 5 units (ref 0–19)
Gliadin IgG: 2 units (ref 0–19)

## 2022-10-06 LAB — TISSUE TRANSGLUTAMINASE, IGA: Transglutaminase IgA: <2 U/mL (ref 0–3)

## 2022-10-06 LAB — CORTISOL: Cortisol: 9.2 ug/dL

## 2022-10-06 MED ORDER — mycophenolate (MYFORTIC) EC tablet 360 mg
360 | Freq: Two times a day (BID) | ORAL | Status: AC
Start: 2022-10-06 — End: 2022-10-10
  Administered 2022-10-06 – 2022-10-10 (×7): via ORAL

## 2022-10-06 MED ORDER — fidaxomicin (DIFICID) tablet 200 mg
200 | Freq: Two times a day (BID) | ORAL | Status: AC
Start: 2022-10-06 — End: 2022-10-16
  Administered 2022-10-06 – 2022-10-10 (×8): via ORAL

## 2022-10-06 MED FILL — HEPARIN (PORCINE) 5,000 UNIT/ML INJECTION SOLUTION: 5000 5,000 unit/mL | INTRAMUSCULAR | Qty: 1

## 2022-10-06 MED FILL — DIFICID 200 MG TABLET: 200 200 mg | ORAL | Qty: 1

## 2022-10-06 MED FILL — MYFORTIC 360 MG TABLET,DELAYED RELEASE: 360 360 mg | ORAL | Qty: 2

## 2022-10-06 MED FILL — MEPRON 750 MG/5 ML ORAL SUSPENSION: 750 750 mg/5 mL | ORAL | Qty: 10

## 2022-10-06 MED FILL — GENTAMICIN 0.32 MG/ML /CITRATE 4% ANTIBIOTIC IV LOCK: 0.32 0.32 mg/mL | Qty: 5

## 2022-10-06 MED FILL — TACROLIMUS 5 MG CAPSULE, IMMEDIATE-RELEASE: 5 5 MG | ORAL | Qty: 1

## 2022-10-06 MED FILL — TACROLIMUS 1 MG CAPSULE, IMMEDIATE-RELEASE: 1 1 MG | ORAL | Qty: 1

## 2022-10-06 MED FILL — NIFEDIPINE ER 60 MG TABLET,EXTENDED RELEASE 24 HR: 60 60 MG (OSM) | ORAL | Qty: 1

## 2022-10-06 MED FILL — CARVEDILOL 6.25 MG TABLET: 6.25 6.25 MG | ORAL | Qty: 2

## 2022-10-06 MED FILL — MYFORTIC 360 MG TABLET,DELAYED RELEASE: 360 360 mg | ORAL | Qty: 1

## 2022-10-06 NOTE — Progress Notes (Signed)
Department of Internal Medicine  Daily Progress Note  Roberto Rice  10/06/2022    Chief Complaint / Reason for Follow-Up     AKI (acute kidney injury) (CMS-HCC)       Interval History / Subjective     No acute events overnight. Had 3 BM overnight consisting of liquidy stool. Nausea improved. Also continues to have abd pain and HA.     Review of Systems (Focused)     Positive for headache, diarrhea, abd pain      Physical Exam     Temp:  [97 F (36.1 C)-98.4 F (36.9 C)] 98.4 F (36.9 C)  Heart Rate:  [72-92] 72  Resp:  [16] 16  BP: (133-153)/(79-97) 137/87    Physical Exam  Cardiovascular:      Rate and Rhythm: Normal rate and regular rhythm.      Heart sounds: Normal heart sounds.   Pulmonary:      Breath sounds: Normal breath sounds.   Abdominal:      Palpations: Abdomen is soft.      Tenderness: There is abdominal tenderness in the epigastric area.   Neurological:      Mental Status: Mental status is at baseline.   Psychiatric:         Mood and Affect: Mood normal.         Behavior: Behavior normal.         Diagnostic Studies     Labs: Significant for: Cr 10.26    No new imaging to review    Assessment & Plan     Roberto Rice is a 37 y.o. 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     #Vomiting, Diarrhea  Pt presenting with 1 week of nonbloody diarrhea and decreased PO intake. Previously admitted for diarrhea in 07/2022 with C diff colonization and norovirus. Pt has remained afebrile, WBC wnl. Diarrhea improved over admission, however worsened since 7/11.   - C diff with positive DNA but negative toxin, will treat given immunosuppressed   - fidaxomicin 200mg  BID x 10d (EOT 7/22)     - transplant ID consulted, infectious workup negative   - consult GI    - plan for EGD and flex sig for CMV colitis 7/15. Will need NPO midnight and tap enema x2 Monday AM.    - celiac serologies    - AM cortisol wnl     #Headache  Pt  developed headache after thymoglobulin infusion 7/9 with some blurry vision. He reports HA feels like his typical migraines. Nothing has helped headache including heat/ice packs, Tylenol, opiates. Pt does have history of headaches with ATG infusions. Differential also includes hypertensive emergency given vision changes, increased ICP due to steroids.   - CT head negative for acute lesions     #AKI   #Kidney transplant in 2018  #Acute transplant rejection  Cr elevated from baseline 2.4-2.7 per OSH records. Etiologies include prerenal secondary to diarrhea and decreased intake vs ATN due to Bactrim vs acute rejection. Renal bx with IR 7/8 showed acute rejection. Vas cath (from OSH) in place for hemodialysis. Continues to have good UOP.   - transplant renal consulted  - tacro 5mg  AM/6mg  PM (goal level 6-8), cellcept 360mg  BID, pred 5mg    - ATG, methylpred for acute rejection   - Valcyte ppx 100mg  oral solution MWF while on thymo. Holding Bactrim ppx given AKI, on  atovaquone 1.5g   - hold torsemide 80mg  BID with diarrhea   - IR consulted for tunneled dialysis catheter  - hold off given c/f infectious diarrhea   - adeno PCR      #HTN  Transferred with hypertensive urgency on nitro gtt, now resolved. BP elevated to 150/100s.   - goal BP 120/80s per transplant renal   - nifedipine ER 60mg  BID, coreg 12.5 BID   - hydralazine PRN    #Chronic anemia of CKD - Hgb stable     Orders Placed This Encounter   Procedures    Diet renal     DVT prophylaxis: Subcutaneous heparin (prophylaxis)  Code Status: Full Code      [name], MD  10/06/2022 12:34 PM

## 2022-10-06 NOTE — Plan of Care (Signed)
Problem: Patient will remain free of falls  Goal: Universal Fall Precautions  Outcome: Progressing     Problem: Patient will remain free of injury d/t fall  Goal: High Fall Risk Precautions  Outcome: Progressing     Problem: Acute Pain  Description: Patient's pain progressing toward patient's stated pain goal  Goal: Patient displays improved well-being such as baseline levels for pulse, BP, respirations and relaxed muscle tone or body posture  Outcome: Progressing
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
## 2022-10-07 LAB — CBC
Hematocrit: 29.5 % — ABNORMAL LOW (ref 38.5–50.0)
Hemoglobin: 10 g/dL — ABNORMAL LOW (ref 13.2–17.1)
MCH: 31.3 pg (ref 27.0–33.0)
MCHC: 34 g/dL (ref 32.0–36.0)
MCV: 92.1 fL (ref 80.0–100.0)
MPV: 7.8 fL (ref 7.5–11.5)
Platelets: 243 10*3/uL (ref 140–400)
RBC: 3.2 10*6/uL — ABNORMAL LOW (ref 4.20–5.80)
RDW: 14.4 % (ref 11.0–15.0)
WBC: 4.9 10*3/uL (ref 3.8–10.8)

## 2022-10-07 LAB — RENAL FUNCTION PANEL W/EGFR
Albumin: 3.3 g/dL — ABNORMAL LOW (ref 3.5–5.7)
Anion Gap: 12 mmol/L (ref 3–16)
BUN: 57 mg/dL — ABNORMAL HIGH (ref 7–25)
CO2: 21 mmol/L (ref 21–33)
Calcium: 8.7 mg/dL (ref 8.6–10.3)
Chloride: 102 mmol/L (ref 98–110)
Creatinine: 13.19 mg/dL — ABNORMAL HIGH (ref 0.60–1.30)
EGFR: 4
Glucose: 82 mg/dL (ref 70–100)
Osmolality, Calculated: 295 mOsm/kg (ref 278–305)
Phosphorus: 5.8 mg/dL — ABNORMAL HIGH (ref 2.1–4.7)
Potassium: 4.2 mmol/L (ref 3.5–5.3)
Sodium: 135 mmol/L (ref 133–146)

## 2022-10-07 LAB — TACROLIMUS LEVEL: Tacrolimus (LC-MS): 10.9 ng/mL (ref 3.0–15.0)

## 2022-10-07 LAB — MAGNESIUM: Magnesium: 1.9 mg/dL (ref 1.5–2.5)

## 2022-10-07 LAB — CORTISOL: Cortisol: 7.8 ug/dL

## 2022-10-07 MED FILL — DIFICID 200 MG TABLET: 200 200 mg | ORAL | Qty: 1

## 2022-10-07 MED FILL — CARVEDILOL 6.25 MG TABLET: 6.25 6.25 MG | ORAL | Qty: 2

## 2022-10-07 MED FILL — NIFEDIPINE ER 60 MG TABLET,EXTENDED RELEASE 24 HR: 60 60 MG (OSM) | ORAL | Qty: 1

## 2022-10-07 MED FILL — MEPRON 750 MG/5 ML ORAL SUSPENSION: 750 750 mg/5 mL | ORAL | Qty: 10

## 2022-10-07 MED FILL — TACROLIMUS 5 MG CAPSULE, IMMEDIATE-RELEASE: 5 5 MG | ORAL | Qty: 1

## 2022-10-07 MED FILL — MYFORTIC 360 MG TABLET,DELAYED RELEASE: 360 360 mg | ORAL | Qty: 1

## 2022-10-07 NOTE — Plan of Care (Signed)
Problem: Safety  Goal: Patient will be injury free during hospitalization  Description: Assess and monitor vitals signs, neurological status including level of consciousness and orientation. Assess patient's risk for falls and implement fall prevention plan of care and interventions per hospital policy.      Ensure arm band on, uncluttered walking paths in room, adequate room lighting, call light and overbed table within reach, bed in low position, wheels locked, side rails up per policy, and non-skid footwear provided.   Outcome: Progressing     Problem: Patient will remain free of falls  Goal: Universal Fall Precautions  Outcome: Progressing     Problem: Acute Pain  Description: Patient's pain progressing toward patient's stated pain goal  Goal: Patient displays improved well-being such as baseline levels for pulse, BP, respirations and relaxed muscle tone or body posture  Outcome: Progressing
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
## 2022-10-07 NOTE — Progress Notes (Signed)
Hospital Medicine Attending  Daily Progress Note      Chief Complaint / Reason for Follow-Up     Roberto [facility] is a 37 y.o. male on hospital day 8.  The principal reason for today's follow up visit is AKI (acute kidney injury) (CMS-HCC).      Interval History     Patient feels ok this morning. Frustrated that phelobotomy caused significant pain on labdraw today, and that AM meds were delayed. Hopeful that starting fidaxomycin will improve his diarrhea.   Medications     Scheduled Meds:   atovaquone  1,500 mg Oral DAILYWM    carvediloL  12.5 mg Oral BID    famotidine  20 mg Oral Daily 0900    fidaxomicin  200 mg Oral BID    heparin  5,000 Units Subcutaneous 3 times per day    mycophenolate  360 mg Oral BID    NIFEdipine  60 mg Oral BID    tacrolimus  5 mg Oral DAILY 0900    tacrolimus  6 mg Oral Nightly (2100)    [Held by provider] torsemide  80 mg Oral BID (0900, 1700)    valGANciclovir  100 mg Oral Once per day on Monday Wednesday Friday     Continuous Infusions:      Vital Signs     Temp:  [97.9 F (36.6 C)-98.5 F (36.9 C)] 97.9 F (36.6 C)  Heart Rate:  [70-82] 82  Resp:  [16-18] 18  BP: (130-137)/(79-87) 130/79    Intake/Output Summary (Last 24 hours) at 10/07/2022 1146  Last data filed at 10/07/2022 1020  Gross per 24 hour   Intake 470 ml   Output --   Net 470 ml         Physical Exam     Gen - alert, no distress  Neck - IJ in place  CV - RRR, no MRG, no elevated JVP, no edema  Lung - CTA, normal WOB  Abd - +BS, soft, ND mildly TTP, no organomegaly  MSK - b/l swelling in hands  Skin - normal temp, no rashes  Neuro - alert, oriented to self/place/time/situation, no facial assymmetry, no gross focal weakness   Psych - normal mood, normal behavior      Laboratory Data         Lab 10/07/22  0421 10/06/22  0415 10/05/22  0258 10/04/22  0355   WBC 4.9 5.3 4.8 7.4   HEMOGLOBIN 10.0* 9.7* 10.1* 9.4*   HEMATOCRIT 29.5* 28.2* 29.4* 27.6*   MEAN CORPUSCULAR VOLUME 92.1 91.0 92.4 90.9   PLATELETS 243 230 240 225            Lab 10/07/22  0421 10/06/22  0415 10/05/22  0258 10/04/22  0355   SODIUM 135 134 134 134   POTASSIUM 4.2 3.8 5.1 4.4   CHLORIDE 102 101 100 100   CO2 21 22 17* 20*   BUN 57* 47* 69* 55*   CREATININE 13.19* 10.26* 14.88* 13.59*   GLUCOSE 82 104* 102* 95       Labs reviewed. Please see the EMR for more details.     Diagnostic Studies     None New    The images associated with the above reports were personally reviewed.      Assessment & Plan     [facility]to [facility] is a 37 y.o. male on hospital day 8.  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 Initially in MICU for hypertensive urgency on nitro gtt as also with acute renal failure. Kidney biopsy concerning for rejection. S/p high dose steroids and thymoglobulin.     #Diarrhea, nausea:  +CDiff DNA with toxin testing negative. While this could indicate colonization, in light of severe symptoms, immunosuppressed state, and after discussion with GI fellow, started treatment with fidaxomycin on 7/13. Will touch base with transplant ID [id number]    #Acute kidney injury on CKD  In patient with renal transplant 2018. Cr baseline ~2.5. Renal Bx on 7/8 concerning for acute rejection. Vascath in place for iHD. Appreciate renal Tx team S/p thymoglobulin x2. Cont tacro, mycophenylate. Will discuss with Renal Tx team if ok to hold prophylactic bactrim/valcyte if no further plans for thymo. Cont to hold diuretics in setting fo diarrhea. Has temp line, need IR placement of tunneled. Will discuss possible line holiday with Renal Tx team.     #HTN:   Cont nifedipine XL 60mg , coreg 12.5mg       Future Appointments   Date Time Provider Department Center   10/08/2022  1:00 PM [facility]  IR BP4 [facility]     Medical Decision Making:  Severe exacerbation of chronic illness  Discussed with physician/APP from another specialty or practice, other licensed professional (PT/OT/SLP/RT), or a non-medical community professional: Renal Tx  Labs reviewed (1 pt each): CBC, renal  Test results reviewed (1 pt each): tacro level    Roberto Almond Lawernce Keas, MD  Attending Physician  Department of Internal Medicine  Pager ID [phone]  11:46 AM, 10/07/2022  -

## 2022-10-08 ENCOUNTER — Inpatient Hospital Stay: Admit: 2022-10-09 | Payer: PRIVATE HEALTH INSURANCE

## 2022-10-08 ENCOUNTER — Inpatient Hospital Stay: Admit: 2022-10-08 | Payer: PRIVATE HEALTH INSURANCE | Attending: Diagnostic Radiology

## 2022-10-08 LAB — CBC
Hematocrit: 27.9 % — ABNORMAL LOW (ref 38.5–50.0)
Hemoglobin: 9.5 g/dL — ABNORMAL LOW (ref 13.2–17.1)
MCH: 30.7 pg (ref 27.0–33.0)
MCHC: 33.9 g/dL (ref 32.0–36.0)
MCV: 90.6 fL (ref 80.0–100.0)
MPV: 7.6 fL (ref 7.5–11.5)
Platelets: 253 10*3/uL (ref 140–400)
RBC: 3.08 10*6/uL — ABNORMAL LOW (ref 4.20–5.80)
RDW: 14.3 % (ref 11.0–15.0)
WBC: 4.3 10*3/uL (ref 3.8–10.8)

## 2022-10-08 LAB — RENAL FUNCTION PANEL W/EGFR
Albumin: 3.3 g/dL — ABNORMAL LOW (ref 3.5–5.7)
Anion Gap: 12 mmol/L (ref 3–16)
BUN: 67 mg/dL — ABNORMAL HIGH (ref 7–25)
CO2: 19 mmol/L — ABNORMAL LOW (ref 21–33)
Calcium: 8.8 mg/dL (ref 8.6–10.3)
Chloride: 104 mmol/L (ref 98–110)
Creatinine: 14.36 mg/dL — ABNORMAL HIGH (ref 0.60–1.30)
EGFR: 4
Glucose: 85 mg/dL (ref 70–100)
Osmolality, Calculated: 299 mOsm/kg (ref 278–305)
Phosphorus: 6.6 mg/dL — ABNORMAL HIGH (ref 2.1–4.7)
Potassium: 4.2 mmol/L (ref 3.5–5.3)
Sodium: 135 mmol/L (ref 133–146)

## 2022-10-08 LAB — TACROLIMUS LEVEL: Tacrolimus (LC-MS): 9.3 ng/mL (ref 3.0–15.0)

## 2022-10-08 LAB — MAGNESIUM: Magnesium: 1.9 mg/dL (ref 1.5–2.5)

## 2022-10-08 MED ORDER — lidocaine-EPINEPHrine 2 %-1:100,000 injection
2 | INTRAMUSCULAR | Status: AC
Start: 2022-10-08 — End: ?

## 2022-10-08 MED ORDER — OMNIPAQUE 300 mg/mL (iohexol) 1-30 mL
300 | Freq: Once | INTRAVENOUS | Status: AC | PRN
Start: 2022-10-08 — End: 2022-10-08
  Administered 2022-10-08: 15:00:00 via INTRAVENOUS

## 2022-10-08 MED ORDER — fentaNYL (SUBLIMAZE) injection
50 | INTRAMUSCULAR | Status: AC | PRN
Start: 2022-10-08 — End: 2022-10-08
  Administered 2022-10-08 (×4): via INTRAVENOUS

## 2022-10-08 MED ORDER — fidaxomicin (DIFICID) 200 mg Tab tablet
200 | ORAL_TABLET | Freq: Two times a day (BID) | ORAL | 0 refills | Status: AC
Start: 2022-10-08 — End: 2022-10-16

## 2022-10-08 MED ORDER — fentaNYL (SUBLIMAZE) 50 mcg/mL injection
50 | INTRAMUSCULAR | Status: AC
Start: 2022-10-08 — End: ?

## 2022-10-08 MED ORDER — midazolam (PF) (VERSED) 1 mg/mL injection
1 | INTRAMUSCULAR | Status: AC
Start: 2022-10-08 — End: ?

## 2022-10-08 MED ORDER — lidocaine 10 mg/mL (1 %) injection
10 | INTRAMUSCULAR | Status: AC | PRN
Start: 2022-10-08 — End: 2022-10-08
  Administered 2022-10-08 (×2): via SUBCUTANEOUS

## 2022-10-08 MED ORDER — lidocaine-EPINEPHrine 1 %-1:100,000 injection
1 | INTRAMUSCULAR | Status: AC | PRN
Start: 2022-10-08 — End: 2022-10-08
  Administered 2022-10-08: 15:00:00 via SUBCUTANEOUS

## 2022-10-08 MED ORDER — heparin (porcine) 1,000 unit/mL injection
1000 | INTRAMUSCULAR | Status: AC
Start: 2022-10-08 — End: ?

## 2022-10-08 MED ORDER — heparin (porcine) injection
1000 | INTRAMUSCULAR | Status: AC | PRN
Start: 2022-10-08 — End: 2022-10-08
  Administered 2022-10-08 (×2): via INTRAVENOUS

## 2022-10-08 MED ORDER — lidocaine 10 mg/mL (1 %) injection
10 | INTRAMUSCULAR | Status: AC
Start: 2022-10-08 — End: ?

## 2022-10-08 MED ORDER — midazolam (PF) (VERSED) injection
1 | INTRAMUSCULAR | Status: AC | PRN
Start: 2022-10-08 — End: 2022-10-08
  Administered 2022-10-08 (×4): via INTRAVENOUS

## 2022-10-08 MED FILL — FAMOTIDINE 20 MG TABLET: 20 20 MG | ORAL | Qty: 1

## 2022-10-08 MED FILL — TACROLIMUS 5 MG CAPSULE, IMMEDIATE-RELEASE: 5 5 MG | ORAL | Qty: 1

## 2022-10-08 MED FILL — DIFICID 200 MG TABLET: 200 200 mg | ORAL | Qty: 1

## 2022-10-08 MED FILL — NIFEDIPINE ER 60 MG TABLET,EXTENDED RELEASE 24 HR: 60 60 MG (OSM) | ORAL | Qty: 1

## 2022-10-08 MED FILL — VALGANCICLOVIR 50 MG/ML ORAL SOLUTION: 50 50 mg/mL | ORAL | Qty: 9

## 2022-10-08 MED FILL — HEPARIN (PORCINE) 5,000 UNIT/ML INJECTION SOLUTION: 5000 5,000 unit/mL | INTRAMUSCULAR | Qty: 1

## 2022-10-08 MED FILL — TACROLIMUS 1 MG CAPSULE, IMMEDIATE-RELEASE: 1 1 MG | ORAL | Qty: 1

## 2022-10-08 MED FILL — LIDOCAINE 20 MG/ML (2 %)-EPINEPHRINE 1:100,000 INJECTION SOLUTION: 2 2 %-1:100,000 | INTRAMUSCULAR | Qty: 20

## 2022-10-08 MED FILL — MIDAZOLAM (PF) 1 MG/ML INJECTION SOLUTION: 1 1 mg/mL | INTRAMUSCULAR | Qty: 2

## 2022-10-08 MED FILL — HEPARIN (PORCINE) 1,000 UNIT/ML INJECTION SOLUTION: 1000 1,000 unit/mL | INTRAMUSCULAR | Qty: 10

## 2022-10-08 MED FILL — CARVEDILOL 6.25 MG TABLET: 6.25 6.25 MG | ORAL | Qty: 2

## 2022-10-08 MED FILL — GENTAMICIN 0.32 MG/ML /CITRATE 4% ANTIBIOTIC IV LOCK: 0.32 0.32 mg/mL | Qty: 5

## 2022-10-08 MED FILL — FENTANYL (PF) 50 MCG/ML INJECTION SOLUTION: 50 50 mcg/mL | INTRAMUSCULAR | Qty: 2

## 2022-10-08 MED FILL — OMNIPAQUE 300 MG IODINE/ML INTRAVENOUS SOLUTION: 300 300 mg iodine/mL | INTRAVENOUS | Qty: 30

## 2022-10-08 MED FILL — MEPRON 750 MG/5 ML ORAL SUSPENSION: 750 750 mg/5 mL | ORAL | Qty: 10

## 2022-10-08 MED FILL — MYFORTIC 360 MG TABLET,DELAYED RELEASE: 360 360 mg | ORAL | Qty: 1

## 2022-10-08 MED FILL — LIDOCAINE HCL 10 MG/ML (1 %) INJECTION SOLUTION: 10 10 mg/mL (1 %) | INTRAMUSCULAR | Qty: 20

## 2022-10-08 NOTE — Telephone Encounter (Signed)
Formatting of this note might be different from the original.  Dr. Barbee Cough called from [facility] and was transferred to providers line.    Phone number [phone]  Chauncey Reading, MA    Electronically signed by Chauncey Reading, MA at 10/08/2022  4:39 PM EDT

## 2022-10-08 NOTE — Discharge Summary (Signed)
 [facility]  Inpatient Discharge Summary     Patient: [facility]  Age: 37 y.o.    MRN: [id number]   CSN: [id number]    Date of Admission: 09/29/2022  Date of Discharge: 10/10/2022  Attending Physician: No att. 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  Successful placement of left internal jugular approach tunneled dialysis catheter.     Lines and tubes:  Patient Lines/Drains/Airways Status       Active Line / PIV Line       Name Placement date Placement time Site Days    HD Catheter Dual Lumen (Vas Cath) Tunneled Left Internal Jugular 10/08/22  1058  Internal Jugular  2                    Other Procedures / Pertinent Imaging:    7/8 Ultrasound Renal transplant   IMPRESSION:   Normal sonographic appearance of the right lower quadrant renal transplants.     EXAM: CT HEAD WO CONTRAST    INDICATION: Headache, new or worsening, neuro deficit (Age 14-49y)    TECHNIQUE: Axial thin section CT images of the head were obtained without contrast. Sagittal and coronal 2-D multiplanar reconstructions were performed at the scanner.    COMPARISON: None available.    FINDINGS:    Adequate diagnostic quality.    Brain  parenchyma: Normal brain attenuation.    Ventricles and extraaxial spaces: Normal ventricular system. 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    Orbits, paranasal sinuses, mastoids: No acute orbital abnormality. Clear paranasal sinuses. Clear mastoid air cells.    Extracranial soft tissues: Normal.    Calvarium and skull base: No fracture or suspicious osseous lesion.    Impression   IMPRESSION:    1.  No acute intracranial abnormality.  2.  No intracranial mass effect or hemorrhage.    Report Verified by: Roberto Kemps, MD at 10/03/2022 6:30 PM EDT       Kidney Biopsy  CASE: [id number]  PATIENT: [facility]  Clinical History:   aki with cr 11.3 bun 64 started on ihd at osh prior to  transver upon transfer to [facility] cr11.o4 -->11.81  Pre-Operative Diagnosis: None Given  Post-Operative Diagnosis:     None Given  Specimen(s) Submitted:   A. transplant kidney right  CPT Code(s):   88305 X 1; 88313 X 5; 88341 X 2; 65784 X 1  Additional Information:    FINAL  DIAGNOSIS:    Kidney, transplant, core biopsy:  - Acute T cell-mediated rejection, grade IB.       - Severe tubulitis (t3).       - Severe interstitial inflammation (i3) consisting of mainly plasma  cells.  - Mild to moderate microvascular inflammation with positive serum DSAs. See  comment.       - Mild glomerulitis (g1).       - Moderate peritubular capillaritis (ptc2).       - C4d negative by IHC.  - Mild interstitial fibrosis and tubular atrophy.    Comment:  The patient received a decreased donor kidney transplant in 2018 for  hypertensive renal disease and was transferred to [facility] with increased serum  creatinine up to 15 mg/dL and altered mental status following admission to  outside hospital for diarrheal episodes. Additional medical history  includes multiple episodes of acute cellular rejection, most  recently TCMR  1B diagnosed 07/11/2022, treated with IVIG and steroids (patient refused  thymo and signed out AMA). DSA screening is positive for class I (A11 @  14200 MFI, B57 @ 2100 MFI) and class II (DQ5 @ 17600 MFI) HLA antibodies.  Viral testing is negative for active infection. Urine shows moderate  proteinuria, trace hematuria, and negative culture. UPCR is 2.05.    Major findings in this biopsy include severe tubulitis, and severe  interstitial edema with diffuse plasmacytic inflammation. CMV and SV-40  immunostains are negative and there is no evidence of pyelonephritis in the  specimen. In the setting of acute TCMR, the diagnosis of antibody-mediated  rejection by Banff criteria requires at least moderate glomerulitis (g2),  which is not present in this material. However, ABMR is not entirely  excluded. Chronic changes include interstitial fibrosis and tubular atrophy  involving 20% of the cortex, two of 25 globally sclerotic glomeruli, and  mild arteriosclerosis.       Consulting Services (include reason)     Transplant renal for hx transplant  Transplant ID for diarrhea   GI for diarrhea   IR for renal biopsy     Allergies     No Known Allergies    Discharge Medications        Medication List        TAKE these medications, which are NEW        Quantity/Refills   atovaquone 750 mg/5 mL suspension  Commonly known as: MEPRON  Take 10 mLs (1,500 mg total) by mouth daily with breakfast.   Quantity: 210 mL  Refills: 0     carvediloL 12.5 MG tablet  Commonly known as: COREG  Take 1 tablet (12.5 mg total) by mouth 2 times a day.   Quantity: 60 tablet  Refills: 0     famotidine 20 MG tablet  Commonly known as: PEPCID  Take 1 tablet (20 mg total) by mouth daily.   Quantity: 60 tablet  Refills: 0     fidaxomicin 200 mg Tab tablet  Commonly known as: DIFICID  Take 1 tablet (200 mg total) by mouth 2 times a day for 8 days.   Quantity: 16 tablet  Refills: 0     simethicone 80 MG chewable tablet  Commonly known as:  MYLICON  Chew 1 tablet (80 mg total) by mouth 4 times a day as needed for Flatulence.   Quantity: 30 tablet  Refills: 0     valGANciclovir 50 mg/mL Solr  Commonly known as: VALCYTE  Take 2 mLs (100 mg total) by mouth every  Monday, Wednesday, and Friday.   Quantity: 30 mL  Refills: 0     vancomycin 125 MG capsule  Commonly known as: VANCOCIN  Take 1 capsule (125 mg total) by mouth 4 times a day for 10 days. Indications: Clostridium difficile infection   Quantity: 40 capsule  For: Clostridium difficile infection  Refills: 0            TAKE these medication, which have CHANGED        Quantity/Refills   mycophenolate 360 MG Tbec  Commonly known as: MYFORTIC  Take 1 tablet (360 mg total) by mouth 2 times a day.  What changed:   medication strength  how much to take   Quantity: 30 tablet  Refills: 0     * tacrolimus 5 MG capsule  Commonly known as: PROGRAF  Take 1 capsule (5 mg total) by mouth daily. Take daily in the morning.  What changed:   when to take this  additional instructions   Quantity: 30 capsule  Refills: 0     * tacrolimus 1 MG capsule  Commonly known as: PROGRAF  Take 6 capsules (6 mg total) by mouth at bedtime.  What changed: Another medication with the same name was changed. Make sure you understand how and when to take each.   Quantity: 30 capsule  Refills: 0           * This list has 2 medication(s) that are the same as other medications prescribed for you. Read the directions carefully, and ask your doctor or other care provider to review them with you.                TAKE these medications, which you were ALREADY TAKING        Quantity/Refills   cholecalciferol (vitamin D3) 1000 units tablet  Take 2 tablets (2,000 Units total) by mouth daily.   Refills: 0     NIFEdipine 60 MG (OSM) 24 hr tablet  Commonly known as: PROCARDIA-XL  Take 1 tablet (60 mg total) by mouth 2 times a day.   Refills: 0            STOP taking these medications      isosorbide mononitrate 30 MG 24 hr tablet  Commonly known as:  IMDUR     potassium chloride 20 MEQ tablet  Commonly known as: KLOR-CON M20     predniSONE 10 MG tablet  Commonly known as: DELTASONE     sulfamethoxazole-trimethoprim 400-80 mg per tablet  Commonly known as: BACTRIM               Where to Get Your Medications        These medications were sent to [facility]  [address], [name] [address]      Hours: Sunday - Saturday: 8:00AM - 6:00PM Phone: [phone]   fidaxomicin 200 mg Tab tablet       These medications were sent to [id number] [place], [place] - [address] [facility] [address] AT [facility] [address]  [address] [place] [address]      Phone: [phone]   atovaquone 750 mg/5 mL suspension  carvediloL 12.5 MG tablet  famotidine 20 MG tablet  mycophenolate 360 MG Tbec  simethicone 80 MG chewable tablet  tacrolimus 1 MG capsule  tacrolimus 5 MG capsule  valGANciclovir 50 mg/mL Solr  vancomycin 125 MG capsule  Discharge Exam     Physical Exam  Constitutional:       Appearance: Normal appearance.   HENT:      Head: Normocephalic and atraumatic.   Cardiovascular:      Rate and Rhythm: Normal rate and regular rhythm.   Pulmonary:      Effort: Pulmonary effort is normal.      Breath sounds: Normal breath sounds.   Abdominal:      Palpations: Abdomen is soft.      Tenderness: There is no abdominal tenderness. There is no guarding.   Musculoskeletal:      Right lower leg: No edema.      Left lower leg: No edema.      Comments: Edema of left hand. Per patient, he retains fluid in hands and face more than LE.   Skin:     General: Skin is warm and dry.      Comments: Dialysis line in place over chest. Very tender to palpation around tunneled line. No bruising, erythema, bleeding, or discharge from site.   Neurological:      General: No focal deficit present.      Mental Status: He is alert and oriented to person, place, and time.           Reason for Admission     Shion Bluestein is a 37 y.o. 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 Etiologies include prerenal secondary to diarrhea and decreased intake vs ATN due to Bactrim vs acute rejection. Renal US shows normal appearance of transplanted kidney, no hydronephrosis. Renal bx 7/8 with type 1B acute cellular rejection. Pt was able to complete 2 treatments of thymoglobulin and methylprednisolone, however developed severe headache and diarrhea. He was started on atovaquone and Valcyte prophylaxis and will continue for 3 months. Mycophenolate and tacrolimus continued for immunosuppression.  Tunneled dialysis line placed by IR 7/15 and pt will receive outpatient dialysis.     #Nausea, vomiting, diarrhea   Pt with abdominal pain, nausea, vomiting, diarrhea that has been waxing and waning since April. Pt has remained afebrile, no leukocytosis. Broad infectious workup negative except C diff, which was positive for DNA but negative for toxin. Started treatment due to immunosuppression with fidaxomicin for 10 days per GI and ID recommendations (oral vancomycin for outpatient use instead of fidaxomicin). Diarrhea improving. End of treatment 10/15/22.     #HTN  Pt admitted to MICU for hypertensive urgency on nitro gtt. Transitioned to nifedipine and carvedilol, blood pressures managed  with these oral medications.    Condition on Discharge     1. Functional Status: normal    2. Mental Status: Alert/Oriented    3. Dietary Restrictions / Tube Feeding / TPN  None. Recommend low  sodium diet for kidneys.    4. Discharge specific orders:   None required    5. Core measures followed: (if this is a core measure patient)  Discharge Weight: 204 lb 2.3 oz (92.6 kg)       Disposition     Home independent      Follow-Up Appointments     Future Appointments   Date Time Provider Department [facility]   11/09/2022  1:40 PM Cherlynn Kaiser, MD (PCP) [facility]     Follow up with Outpatient Nephrology at [facility] - Dr. Astrid Drafts.  - Patient to discuss with nephrologist how to transition transplant nephrology care from [facility] to [facility] [place]      Signed:  Fredric Dine, DO  10/10/2022, 4:50 PM

## 2022-10-08 NOTE — Plan of Care (Addendum)
Hemodialysis Treatment Plan of Care Goal:   Patient will achieve appropriate dialysis treatment as evidenced by:   Achieved ordered dry weight and/or fluid removal with post weight being within 1.0 kg (L) of ordered UF goal.   Remained hemodynamically stable within ordered parameters and absence of adverse intradialytic symptoms  Achieve effective regulation of serum electrolyte levels and/or drug toxicity as indicated    Patient Tolerated Hemodialysis Procedure : Pt c/o cramping radiating to chest mid HD tx    Hemodialysis Weights  Dry Weight:  (UF amt changed to 1.2L as tolerated per Dr. Servando Snare)  Fluid removal goal: 1.2L  Last Treatment Post Weight: 93.7 kg (206 lb 9.1 oz)  Pre Weight: 93.7 kg (206 lb 9.1 oz)  Pre Weight Source: Standing Scale  Weight: 92.6 kg (204 lb 2.3 oz)  Post Weight Source: Standing Scale    HD Post Treatment Vitals:  BP: 143/88  Heart Rate: 74  Temp: 98.6 F (37 C)  Resp: 18    Fluid Net Fluid Removal Calculation (Any blood products given during HD treatment are accounted for in net fluid removal and will be recorded in I/O upon scanning)  Hemodialysis Output (mL): 1470 mL  Other fluids given (mL): 0 mL (Do Not Include Blood transfusion)  Rinseback Volume (mL): 400 mL  *Net fluid removal (ml): 1070 mL*    Delivered Dialysis Prescription:  Potassium (mEq/L): 3  Calcium (mEq/L): 2.5  Sodium (mEq/L): 138  Bicarbonate (mEq/L): 30  Blood Flow:300  Dialysate Flow: 600    Prescribed Treatment Time (minutes): 210  Duration of Treatment (minutes): 210 minutes    HD Access: (L)IJ  HD Access Function: Functioned well. Aspirate and flushes well  HD Catheter Dual Lumen (Vas Cath) Tunneled Left Internal Jugular-Proximal(Red) Lumen Status: Capped-Citrate Locked  [REMOVED] HD Catheter Dual Lumen (Vas Cath) Non-tunneled Left Internal Jugular-Proximal(Red) Lumen Status: Capped-Citrate Locked  HD Catheter Dual Lumen (Vas Cath) Tunneled Left Internal Jugular-Medial(Blue) Lumen Status: Capped-Citrate  Locked  [REMOVED] HD Catheter Dual Lumen (Vas Cath) Non-tunneled Left Internal Jugular-Medial(Blue) Lumen Status: Capped-Citrate Locked  Post-Treatment procedures: Blood returned, Gentamicin/ 4% Citrate Dwell, Catheter clamped and capped    Needle Size: Not applicable       Lidocaine Used: N/A  Access Needle Placement / Position: N/A    Other / Comments: Pt c/o cramping radiating to chest, placed UF in min and applied O2 until no further c/o cramping/chest discomfort. Pt stated immediately after placing UF in min and applying O2 he felt better and then resolved  less than 5 mins. UF out of min after no further c/o cramping radiating to chest.  No further c/o cramping or chest discomfort for the rest of HD tx. Pt left HD tx acute room alert and orientedx3. Able to make needs known and denies pain/discomfort. No s/sx of distress noted upon leaving HD tx acute room.      Was a [employer] Used for this Treatment?  No    Did patient meet fluid removal goal within 1.0 L of ordered UF?  Yes    Was an order modification for fluid removal given?  Yes    Name of provider contacted to adjusted target fluid removal?  Provider Name or n/a: Dr. Sherrye Payor      PRE-TX RN Report From: Wyn Forster RN  POST-TX RN Report To: Madison Rn    PRE-TX CMU Contact: N/A  POST-TX CMU Contact: N/A    Hepatitis Status:  Hep B Core Total Ab: negative  Hep B  Surface Ab: positive  Hepatitis B Surface Ag: negative  Machine Number: [id number]    Hemodialysis Meds:   Retacrit (Epoetin Alfa-epbx)  N/A  Zemplar (Paracalcitol)  N/A  Venofer (Iron Sucrose)  N/A      Reason for admission  Acute kidney injury (CMS-HCC) [N17.9]

## 2022-10-08 NOTE — Brief Op Note (Signed)
Vascular & Interventional Radiology Brief Op Note       Date: 10/08/2022  Patient: Roberto [facility]  DOB: 12-Jun-1985  MRN: [id number]    IR Procedure(s) Performed:   Attempted placement of right internal jugular approach tunneled dialysis catheter.   Left internal jugular approach tunneled dialysis catheter     Operators:  Patrina Levering, DO, Willa Rough, MS4   Kelle Darting, MD    Pre-operative diagnosis:   Renal dysfunction     Post-operative diagnosis:  Same  Right brachiocephalic vein occlusion    Intra-procedural Medications:    Medications   Medication Event Details Admin User Admin Time   fentaNYL (SUBLIMAZE) injection Medication Given Dose: 50 mcg; Route: Intravenous Ernestine Conrad, RN 10/08/2022 10:11 AM   midazolam (PF) (VERSED) injection Medication Given Dose: 1 mg; Route: Intravenous Ernestine Conrad, RN 10/08/2022 10:11 AM   lidocaine 10 mg/mL (1 %) injection Medication Given Dose: 10 mL; Route: Subcutaneous; Site: Other; Comment: access site [employer], DO 10/08/2022 10:14 AM   fentaNYL (SUBLIMAZE) injection Medication Given Dose: 50 mcg; Route: Intravenous Ernestine Conrad, RN 10/08/2022 10:16 AM   midazolam (PF) (VERSED) injection Medication Given Dose: 1 mg; Route: Intravenous Ernestine Conrad, RN 10/08/2022 10:16 AM   fentaNYL (SUBLIMAZE) injection Medication Given Dose: 25 mcg; Route: Intravenous Ernestine Conrad, RN 10/08/2022 10:22 AM   midazolam (PF) (VERSED) injection Medication Given Dose: 1 mg; Route: Intravenous Ernestine Conrad, RN 10/08/2022 10:22 AM   lidocaine 10 mg/mL (1 %) injection Medication Given Dose: 10 mL; Route: Subcutaneous; Site: Other Kelle Darting, MD 10/08/2022 10:49 AM   midazolam (PF) (VERSED) injection Medication Given Dose: 1 mg; Route: Intravenous Ernestine Conrad, RN 10/08/2022 10:50 AM   fentaNYL (SUBLIMAZE) injection Medication Given Dose: 50 mcg; Route: Intravenous Ernestine Conrad, RN 10/08/2022 10:50 AM   lidocaine-EPINEPHrine 1 %-1:100,000 injection Medication Given Dose:  10 mL; Route: Subcutaneous; Site: Other; Comment: Access site Kelle Darting, MD 10/08/2022 10:54 AM   heparin (porcine) injection Medication Given Dose: 2,000 Units; Route: Intravenous; Comment: red Kelle Darting, MD 10/08/2022 11:06 AM   heparin (porcine) injection Medication Given Dose: 2,000 Units; Route: Intravenous; Comment: blue Kelle Darting, MD 10/08/2022 11:06 AM   OMNIPAQUE 300 mg/mL (iohexol) 1-30 mL Medication Given Dose: 30 mL; Route: Intravenous Caryl Comes 10/08/2022 11:20 AM       Findings:  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   Successful placement of left internal jugular approach tunneled dialysis catheter.      Specimens removed:  None    Estimated Blood Loss:  Minimal (less than 15mL)    Complications:  None    Access site(s):  Right IJ  Left IJ    Post procedure care/monitoring:    Monitor for sedation recovery    Recommendations/Follow-up:  Left internal jugular Tunneled dyalysis catheter is ready for immediate use    Please refer to full dictated Interventional Radiology report for details of findings/procedures, which can be located in EPIC Procedures (or EPIC Imaging) Tabs.      Please call with any questions.      Patrina Levering, DO  Vascular & Interventional Radiology  10/08/2022,11:21 AM  [facility]: [phone])

## 2022-10-08 NOTE — Care Coordination-Inpatient (Signed)
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 difficile diarrhea, Hypertension, and Kidney transplant recipient.    PCP:  ATTENDING PROVIDER UNKNOWN    Home Pharmacy:    [facility]  [address]  [place] [address]  Phone: [phone]         Medical Insurance Coverage:  Payor: [facility] [place] / Plan: [facility] [place] MEDICAID / Product Type: Medicaid [place] care /     Other Pertinent Information     SW received report from [name] and completed chart review. Patient is not medically ready to discharge at this time. Likely 1-2 more days.  IR for tunneled line scheduled for today.  May receive script with GI, pending.      Referral sent in EPIC to [facility] 7/11. SW contacted central intake and confirmed that referral was received 7/11. [name] has been assigned to review referral [phone] ext [phone].      SW received call from [name] [employer] [facility] and they said that patient has been accepted. MWF 2nd shift @11 :50AM. Requesting for H&P, hep B panel, and med list to be faxed to [phone]. SW sent fax.      [facility]  [address]  [place], [place] [address]  [phone]     SW received call from the house HD clinic - [name] [phone] following. SW contacted and provided update to [name] that patient may DC tomorrow and start care on Wed. SW can provide further update tomorrow.     Discharge Plan     Anticipated discharge plan:  Home with HD     Anticipated discharge date:  7/16     CM/SW will continue to follow and remain available for discharge planning needs.      Chapman Moss, MSW     Cell  [phone]

## 2022-10-08 NOTE — Telephone Encounter (Signed)
Prior authorization requested for Dificid. Insurance has denied the request due to requiring patient to try vancomycin first. Medication Discharge Service team informed of denial     Denial letter uploaded in media tab. Click on Rx Prior Authorization link below for details

## 2022-10-08 NOTE — Nursing Note (Signed)
Patient returned from IR in stable condition. Pt refusing to take morning medication that was missed due to being off the floor, until old vas cath is removed. Paged team and spoke with MD Chestine Spore.

## 2022-10-08 NOTE — Discharge Summary (Shared)
[place]  Inpatient Discharge Summary     Patient: Roberto Rice  Age: 37 y.o.    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 difficile diarrhea     Hypertension     Kidney transplant recipient         Discharge Diagnoses     Active Hospital Problems    Diagnosis Date Noted    AKI (acute kidney injury) (CMS-HCC) [N17.9] 10/01/2022    Diarrhea [R19.7] 10/01/2022    Hypertension [I10] 10/01/2022      Resolved Hospital Problems   No resolved problems to display.       Operations/Procedures Performed (include dates)     Surgeries:      Lines and tubes:  Patient Lines/Drains/Airways Status       Active Line / PIV Line       Name Placement date Placement time Site Days    HD Catheter Dual Lumen (Vas Cath) Non-tunneled Left Internal Jugular 09/29/22  2341  Internal Jugular  1    Peripheral IV Left;Posterior Hand --  --  Hand  --    Peripheral IV 09/29/22 Anterior;Left;Proximal Forearm 09/29/22  2340  Forearm  1                    Other Procedures / Pertinent Imaging:  7/8 US Renal transplant   IMPRESSION:     Normal sonographic appearance of the right lower quadrant renal transplants.     Consulting Services (include reason)     Transplant renal for hx transplant  Transplant ID for diarrhea   GI for diarrhea   IR for renal biopsy     Allergies     No Known Allergies    Discharge Medications     {MEDICATION LIST FOR TRANSFERS AND DISCHARGES:22056}        Discharge Exam     Physical Exam      Reason for Admission     Roberto Rice is a 37 y.o. 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     Hospital Course     Active Hospital Problems     Diagnosis Date Noted    AKI (acute kidney injury) (CMS-HCC) [N17.9] 10/01/2022    Diarrhea [R19.7] 10/01/2022    Hypertension [I10] 10/01/2022      Resolved Hospital Problems   No resolved problems to display.     #AKI  #Kidney transplant in 2018  #H/o acute transplant rejection  Cr elevated from baseline 2.4-2.7 per OSH records. Etiologies include prerenal secondary to diarrhea and decreased intake vs   ATN due to Bactrim vs acute rejection. Renal US shows normal appearance of transplanted kidney, no hydronephrosis. Renal bx 7/8 with type 1B acute cellular rejection. Pt was able to complete 2 treatments of thymoglobulin and methylprednisolone, however developed severe headache and diarrhea. He was started on atovaquone and Valcyte prophylaxis and will continue for 3 months. Tunneled dialysis line placed and pt will receive outpatient dialysis.     #Nausea, vomiting, diarrhea   Pt with  abdominal pain, nausea, vomiting, diarrhea that has been waxing and waning since April. Pt has remained afebrile, no leukocytosis. Broad infectious workup negative except C diff, which was positive for DNA but negative for toxin. Started treatment due to immunosuppression with fidaxomicin for 10 days per GI and ID recommendations.     #HTN  Pt admitted to MICU for hypertensive urgency on nitro gtt. Transitioned to nifedipine and carvedilol.     Condition on Discharge     1. Functional Status: {functional capacity:14698::normal}   Describe limitations, if any: ***    2. Mental Status: {MENTAL STATUS:25207}   Describe limitations, if any: ***    3. Dietary Restrictions / Tube Feeding / TPN  Diet/Nutrition Orders    Diet renal     Frequency: Effective Now     Number of Occurrences: Until Specified     Order Questions:      Suicide/Behavior Risk Modification? No     {DISCHARGE DIET:25340::As listed above}    4. Discharge specific orders:   {DISCHARGE SPECIFIC ORDERS:25348}    5. Core measures followed: (if this is a core measure  patient)  Discharge Weight: 204 lb 5.9 oz (92.7 kg)            Disposition     Home independent      Follow-Up Appointments     No future appointments.  No follow-up provider specified.    Signed:    [name], MD  10/01/2022, 2:46 PM     ***Reminders (do not erase information below until ready to sign the note):  (1) Utilize the route function within Epic to deliver this summary to the primary care physician, ATTENDING PROVIDER UNKNOWN, and any other physician who will be involved in transitional care    (2) Update the actual date/time of this note so that it appears at the appropriate place in the medical record    (3) Refresh smart links before signing, but after running the discharge navigator  (particularly the medication reconciliation list)
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
## 2022-10-08 NOTE — Nursing Note (Signed)
Pt discharged from interventional radiology to 9023. Pt has met post procedure sedation criteria and is returning to inpatient care. Report called to [facility]. Patient procedure-specific discharge instructions have been added and will be available at discharge from inpatient care.

## 2022-10-08 NOTE — Progress Notes (Signed)
Department of Internal Medicine  Daily Progress Note  Roberto Rice  10/08/2022    Chief Complaint / Reason for Follow-Up     AKI (acute kidney injury) (CMS-HCC)       Interval History / Subjective     No acute events overnight. Plan for tunneled line with IR today. Continues to have some diarrhea and abd pain.     Review of Systems (Focused)     Positive for headache, diarrhea, abd pain      Physical Exam     Temp:  [98 F (36.7 C)-98.8 F (37.1 C)] 98.1 F (36.7 C)  Heart Rate:  [68-85] 74  Resp:  [10-20] 11  BP: (108-138)/(55-86) 128/76    Physical Exam  Cardiovascular:      Rate and Rhythm: Normal rate and regular rhythm.      Heart sounds: Normal heart sounds.   Pulmonary:      Breath sounds: Normal breath sounds.   Abdominal:      Palpations: Abdomen is soft.      Tenderness: There is abdominal tenderness in the epigastric area.   Neurological:      Mental Status: Mental status is at baseline.   Psychiatric:         Mood and Affect: Mood normal.         Behavior: Behavior normal.         Diagnostic Studies     Labs: Significant for: Cr 13.19 -> 14.36    No new imaging to review    Assessment & Plan     Roberto Rice is a 37 y.o. 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     #Vomiting, Diarrhea  Pt presenting with 1 week of nonbloody diarrhea and decreased PO intake. Previously admitted for diarrhea in 07/2022 with C diff colonization and norovirus. Pt has remained afebrile, WBC wnl. Diarrhea improved over admission, however worsened since 7/11.   - C diff with positive DNA but negative toxin, will treat given immunosuppressed   - fidaxomicin 200mg  BID x 10d (EOT 7/22)     - consult GI    - hold off on EGD and flex sig    - celiac serologies    - AM cortisol wnl     #Headache  Pt developed headache after thymoglobulin infusion 7/9 with some blurry vision. He reports HA feels like his typical migraines. Nothing has helped  headache including heat/ice packs, Tylenol, opiates. Pt does have history of headaches with ATG infusions. Differential also includes hypertensive emergency given vision changes, increased ICP due to steroids.   - CT head negative for acute lesions     #AKI   #Kidney transplant in 2018  #Acute transplant rejection  Cr elevated from baseline 2.4-2.7 per OSH records. Etiologies include prerenal secondary to diarrhea and decreased intake vs ATN due to Bactrim vs acute rejection. Renal bx with IR 7/8 showed acute rejection. Continues to have good UOP.   - transplant renal consulted  - tacro 5mg  AM/6mg  PM (goal level 6-8), mycophenolate 360mg  BID, pred 5mg    - S/p ATG, methylpred x2 for acute rejection, hold given per renal  - Valcyte ppx 100mg  oral solution MWF. Holding Bactrim ppx given AKI, on atovaquone 1.5g. Will need for 3 months given thymo  - hold torsemide 80mg  BID with diarrhea   - IR placed tunneled dialysis catheter 7/15  - adeno  PCR      #HTN  Transferred with hypertensive urgency on nitro gtt, now resolved. BP elevated to 150/100s.   - goal BP 120/80s per transplant renal   - nifedipine ER 60mg  BID, coreg 12.5 BID     #Chronic anemia of CKD - Hgb stable     Orders Placed This Encounter   Procedures    Diet renal     DVT prophylaxis: Subcutaneous heparin (prophylaxis)  Code Status: Full Code      [name], MD  10/08/2022 1:34 PM

## 2022-10-08 NOTE — Plan of Care (Signed)
Brief GI Note    37 y.o. 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 Recent C diff and norovirus infections.     GI consulted for chronic nausea and vomiting since 06/2022, watery diarrhea. Patient over the weekend noted to be C diff DNA Amp positive and toxin negative. After discussion with transplant ID being treated with fidaxomicin. Having less bowel movements per chart review. Patient has been NPO for IR procedure. Has not had enemas this morning.     - Given reported improvement in BM and being treated with fidaxo, will hold off on EGD/flex sig at this point in time.  - Follow celiac serologies   - Continue anti-diarrheals and cycling anti-emetics to patient's symptoms   - GI will sign off. Please reach out with any further questions or concerns.       Sharlot Gowda, MD  Gastroenterology & Hepatology Fellow PGY-4  Division of Digestive Diseases   [facility]

## 2022-10-09 LAB — CBC
Hematocrit: 26.6 % — ABNORMAL LOW (ref 38.5–50.0)
Hemoglobin: 9.1 g/dL — ABNORMAL LOW (ref 13.2–17.1)
MCH: 31.1 pg (ref 27.0–33.0)
MCHC: 34.3 g/dL (ref 32.0–36.0)
MCV: 90.8 fL (ref 80.0–100.0)
MPV: 7.6 fL (ref 7.5–11.5)
Platelets: 243 10*3/uL (ref 140–400)
RBC: 2.93 10*6/uL — ABNORMAL LOW (ref 4.20–5.80)
RDW: 14.5 % (ref 11.0–15.0)
WBC: 4.2 10*3/uL (ref 3.8–10.8)

## 2022-10-09 LAB — RENAL FUNCTION PANEL W/EGFR
Albumin: 3.3 g/dL — ABNORMAL LOW (ref 3.5–5.7)
Anion Gap: 8 mmol/L (ref 3–16)
BUN: 41 mg/dL — ABNORMAL HIGH (ref 7–25)
CO2: 21 mmol/L (ref 21–33)
Calcium: 8.6 mg/dL (ref 8.6–10.3)
Chloride: 105 mmol/L (ref 98–110)
Creatinine: 9.62 mg/dL — ABNORMAL HIGH (ref 0.60–1.30)
EGFR: 7
Glucose: 87 mg/dL (ref 70–100)
Osmolality, Calculated: 287 mOsm/kg (ref 278–305)
Phosphorus: 5.4 mg/dL — ABNORMAL HIGH (ref 2.1–4.7)
Potassium: 4.2 mmol/L (ref 3.5–5.3)
Sodium: 134 mmol/L (ref 133–146)

## 2022-10-09 LAB — HIGH SENSITIVITY TROPONIN
High Sensitivity Troponin: 14 ng/L (ref 0–20)
High Sensitivity Troponin: 16 ng/L (ref 0–20)

## 2022-10-09 LAB — TACROLIMUS LEVEL: Tacrolimus (LC-MS): 9.1 ng/mL (ref 3.0–15.0)

## 2022-10-09 LAB — MAGNESIUM: Magnesium: 1.8 mg/dL (ref 1.5–2.5)

## 2022-10-09 MED ORDER — carvediloL (COREG) 12.5 MG tablet
12.5 | ORAL_TABLET | Freq: Two times a day (BID) | ORAL | 0 refills | Status: AC
Start: 2022-10-09 — End: ?

## 2022-10-09 MED ORDER — mycophenolate (MYFORTIC) 360 MG TbEC
360 | ORAL_TABLET | Freq: Two times a day (BID) | ORAL | 0 refills | 30.00000 days | Status: AC
Start: 2022-10-09 — End: ?

## 2022-10-09 MED ORDER — valGANciclovir (VALCYTE) 50 mg/mL SolR
50 | ORAL | 0 refills | Status: AC
Start: 2022-10-09 — End: ?

## 2022-10-09 MED ORDER — atovaquone (MEPRON) 750 mg/5 mL suspension
750 | Freq: Every day | ORAL | 0 refills | Status: AC
Start: 2022-10-09 — End: ?

## 2022-10-09 MED ORDER — famotidine (PEPCID) 20 MG tablet
20 | ORAL_TABLET | Freq: Every day | ORAL | 0 refills | Status: AC
Start: 2022-10-09 — End: ?

## 2022-10-09 MED ORDER — tacrolimus (PROGRAF) 1 MG capsule
1 | ORAL_CAPSULE | Freq: Every evening | ORAL | 0 refills | Status: AC
Start: 2022-10-09 — End: ?

## 2022-10-09 MED ORDER — vancomycin (VANCOCIN) 125 MG capsule
125 | ORAL_CAPSULE | Freq: Four times a day (QID) | ORAL | 0 refills | Status: AC
Start: 2022-10-09 — End: 2022-10-20

## 2022-10-09 MED ORDER — simethicone (MYLICON) 80 MG chewable tablet
80 | ORAL_TABLET | Freq: Four times a day (QID) | ORAL | 0 refills | Status: AC | PRN
Start: 2022-10-09 — End: ?

## 2022-10-09 MED ORDER — tacrolimus (PROGRAF) 5 MG capsule
5 | ORAL_CAPSULE | Freq: Every day | ORAL | 0 refills | Status: AC
Start: 2022-10-09 — End: ?

## 2022-10-09 MED FILL — TACROLIMUS 5 MG CAPSULE, IMMEDIATE-RELEASE: 5 5 MG | ORAL | Qty: 1

## 2022-10-09 MED FILL — DIFICID 200 MG TABLET: 200 200 mg | ORAL | Qty: 1

## 2022-10-09 MED FILL — TACROLIMUS 1 MG CAPSULE, IMMEDIATE-RELEASE: 1 1 MG | ORAL | Qty: 1

## 2022-10-09 MED FILL — MEPRON 750 MG/5 ML ORAL SUSPENSION: 750 750 mg/5 mL | ORAL | Qty: 10

## 2022-10-09 MED FILL — MYFORTIC 360 MG TABLET,DELAYED RELEASE: 360 360 mg | ORAL | Qty: 1

## 2022-10-09 MED FILL — NIFEDIPINE ER 60 MG TABLET,EXTENDED RELEASE 24 HR: 60 60 MG (OSM) | ORAL | Qty: 1

## 2022-10-09 MED FILL — HEPARIN (PORCINE) 5,000 UNIT/ML INJECTION SOLUTION: 5000 5,000 unit/mL | INTRAMUSCULAR | Qty: 1

## 2022-10-09 MED FILL — CARVEDILOL 6.25 MG TABLET: 6.25 6.25 MG | ORAL | Qty: 2

## 2022-10-09 NOTE — Telephone Encounter (Signed)
Appeal request faxed to plan

## 2022-10-09 NOTE — Progress Notes (Signed)
Discharge instructions given. Patient states he has all of his medications. Patient acknowledged understanding and signed instructions, receipt in chart. No PIV access. Left Vas Cath remains in place, capped. Wife and patient escorted to the elevator, denied transport.

## 2022-10-09 NOTE — Progress Notes (Signed)
[place]  DEPARTMENT OF INTERNAL MEDICINE  DAILY PROGRESS NOTE    Chief Complaint / Reason for Follow-Up     Roberto Rice is a 37 y.o. male. The principal reason for today's follow up visit is diarrhea/ C diff?    Interval History / Subjective     Subjective  7/12 C diff DNA+; toxin neg. Started on fidaxomicin. Pt reports that he had some loose stool earlier this week when C diff was sent. He reports stool more formed after starting fidaxomicin. No abd pain. No fevers. No nausea, vomiting. Fells well today.    Past medical, family, and social histories were reviewed as previously documented. Updates were made as necessary.    Review of Systems     Review of Systems  General: No fever  Skin:    No rashes  Head/Eyes: No headache;   ENT:    No hearing changes  Respiratory:   No cough  Gastrointestinal: as per above.  Genitourinary:             No dysuria    Medications     Includes fidaxomicin 200 mg po BID      Physical Exam     Physical Exam  General Appearance:    Alert, cooperative, no acute distress.   Head:    Normocephalic, without obvious abnormality,    Eyes:    conjunctiva/corneas clear   Throat:   Lips, mucosa, and tongue normal   Neck:   Supple, symmetrical, trachea midline,    Lungs:     Clear to auscultation bilaterally, respirations unlabored    Heart:    Regular rate and rhythm,    Abdomen:     Soft, non-tender,    Skin:   Skin color, texture, turgor normal   Neurologic:   Alert, oriented x 3. No focal motor deficit.         Laboratory Data         Lab 10/09/22  0705 10/08/22  0751 10/07/22  0421 10/06/22  0415   WBC 4.2 4.3 4.9 5.3   HEMOGLOBIN 9.1* 9.5* 10.0* 9.7*   HEMATOCRIT 26.6* 27.9* 29.5* 28.2*   MEAN CORPUSCULAR VOLUME 90.8 90.6 92.1 91.0   PLATELETS 243 253 243 230         Lab 10/09/22  0705 10/08/22  0751 10/07/22  0421 10/06/22  0415   SODIUM 134 135 135 134   POTASSIUM 4.2 4.2 4.2 3.8   CHLORIDE 105 104 102 101   CO2 21 19* 21 22   BUN 41* 67* 57* 47*   CREATININE 9.62* 14.36* 13.19* 10.26*    GLUCOSE 87 85 82 104*   CALCIUM 8.6 8.8 8.7 8.6   MAGNESIUM 1.8 1.9 1.9 1.8   PHOSPHORUS 5.4* 6.6* 5.8* 4.1             Lab 10/09/22  0705 10/08/22  0751 10/07/22  0421 10/06/22  0415   ALBUMINKID 3.3* 3.3* 3.3* 3.3*           Invalid input(s): KEYTONESU  Specialty labs:  7/12 C diff DNA+ toxin-  Stool ova/p, Giardia/Cryptosporidium antigens. Enteric pathogen panel negative. Strongy ab negative.     Assessment & Plan     Roberto Rice is a 37 y.o. male s/p DDKT 2018. 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    Off/on diarrhea  C diff DNA positive/toxin neg  - 07/2022 stool study with similar pattern - no C diff treatment; thought  to be colonization. Stool +norovirus at that time.  - This admission: stool ova/p, Giardia/Cryptosporidium antigens. Enteric pathogen panel negative. Strongy ab negative.   - 10/05/2022 C diff test came back DNA positive; toxin negative. I am not sure this is true episode of C diff (DNA may just represent colonization in absence of toxin detection), but since pt reports improvement on stool consistency while on fidaxomicin and he is now on intensified immunosuppression for treatment of acute rejection, it would be reasonable to complete a 10-day course of fidaxomicin.     On atovaquone and Valcyte prophylaxis.    ID will sign off. Call us back if questions. Thank you.       Code Status: Prior    Signed:  Alaija Ruble Huston Foley, MD  10/10/2022, 4:26 PM

## 2022-10-09 NOTE — Plan of Care (Signed)
Problem: Safety  Goal: Patient will be injury free during hospitalization  Description: Assess and monitor vitals signs, neurological status including level of consciousness and orientation. Assess patient's risk for falls and implement fall prevention plan of care and interventions per hospital policy.      Ensure arm band on, uncluttered walking paths in room, adequate room lighting, call light and overbed table within reach, bed in low position, wheels locked, side rails up per policy, and non-skid footwear provided.   Outcome: Progressing     Problem: Patient will remain free of falls  Goal: Universal Fall Precautions  Outcome: Progressing     Problem: Patient will remain free of injury d/t fall  Goal: High Fall Risk Precautions  Outcome: Progressing     Problem: Acute Pain  Description: Patient's pain progressing toward patient's stated pain goal  Goal: Patient displays improved well-being such as baseline levels for pulse, BP, respirations and relaxed muscle tone or body posture  Outcome: Progressing

## 2022-10-09 NOTE — Care Coordination-Inpatient (Signed)
Advised by med team to set up a new PCP at [facility] for the patient regardless of his distance from home to [facility]   Due to his insurance, only certain MD's are able to see the patient as residents cannot not see out of state patients.   New PCP set up for Friday August 16th, 2024 with an arrival time of 125pm with Dr. Cherlynn Kaiser. Patient has also been added to the waiting list for a possible cancellation.     Jake Samples [facility]   Care Management   [phone]
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
## 2022-10-09 NOTE — Other (Signed)
[place]    Case Manager/Social Worker Discharge Summary     Patient name: Roberto Rice                                        Patient MRN: [id number]  DOB: May 04, 1985                              Age: 37 y.o.              Gender: male  Patient emergency contact: Extended Emergency Contact Information  Primary Emergency Contact: Tetrault,Rhonda  Mobile Phone: [phone]  Relation: Spouse  Preferred language: English  Interpreter needed? No      Attending provider: Mollie Germany, *  Primary care physician: ATTENDING PROVIDER UNKNOWN    The MD has indicated that the patient is ready for discharge.  Blaiden Werth was referred and accepted with [facility] [address] [place], [place] [address] [phone]. Clinic is aware of dc and expecting patient for treatment tomorrow 7/17. INFO added in AVS. Chair time is 11:50AM MWF.      The patient will be transported by wife. PCP scheduled at [facility] per patient request. If patient needs to cancel, will need to contact independently to do so.     Transfer Mode/Level of Care: Family         HD Run sheet faxed to HD clinic [phone].     The plan has been reviewed:     Patient/Family Informed of Discharge Plan: Yes    Plan Reviewed With Patient, Family, or Significant Other: Yes    Patient and or family are aware and in agreement with the discharge plan: Yes             Plan reviewed with MD and other members of the health care team: Yes  Care Plan Completed: Yes    No further CM/SW needs.  Chapman Moss MSW, [place]  [phone]    This plan has been reviewed with the multi-disciplinary team.     Treatment Preferences    Treatment Preferences: Provider Preference    Post-Discharge Goals    Patient's Post-Discharge goals: DC home    Post Acute Care Provider Information:           Community Services at Discharge  Community Services at Home post discharge: Dialysis  Dialysis Needs: Hemodialysis  Name of Dialysis Company: [facility] Phone #:  [phone]  Date of Next Outpatient Treatment: 10/10/22  HD days of week: MWF  HD time of day: 1150AM  HD Transportation provided by: Family  Following Nephrologist: dr. Astrid Drafts

## 2022-10-09 NOTE — Progress Notes (Signed)
[place]  DEPARTMENT OF INTERNAL MEDICINE  DAILY PROGRESS NOTE    Chief Complaint / Reason for Follow-Up     Roberto Rice is a 37 y.o. with a history of kidney transplant in 2018 (ERSD due to HTN) and HTN, presenting as transfer from OSH for AKI in setting of acute transplant rejection.    Interval History / Subjective     Chest pain overnight around site of tunneled dialysis line that was placed yesterday. No drainage or erythema at site. Cardio workup negative. Today, site is sore but manageable pain. Diarrhea is slowing down (3x since yesterday AM, little watery, no blood).    ROS:  (+): chest ache, diarrhea  (-): abdominal pain, HA, N/V, SOB, LE edema, fever, dysuria    Medications     Scheduled Meds:   atovaquone  1,500 mg Oral DAILYWM    carvediloL  12.5 mg Oral BID    famotidine  20 mg Oral Daily 0900    fidaxomicin  200 mg Oral BID    heparin  5,000 Units Subcutaneous 3 times per day    mycophenolate  360 mg Oral BID    NIFEdipine  60 mg Oral BID    tacrolimus  5 mg Oral DAILY 0900    tacrolimus  6 mg Oral Nightly (2100)    [Held by provider] torsemide  80 mg Oral BID (0900, 1700)    valGANciclovir  100 mg Oral Once per day on Monday Wednesday Friday     Continuous Infusions:  PRN Meds:acetaminophen, For Catheter Lock: gentamicin 0.32 mg/mL and citrate 4% antibiotic IV lock, hydrALAZINE, ondansetron, oxyCODONE, proMETHazine, simethicone    Vital Signs     Temp:  [97.4 F (36.3 C)-99.6 F (37.6 C)] 97.4 F (36.3 C)  Heart Rate:  [71-87] 75  Resp:  [17-18] 17  BP: (130-167)/(71-98) 154/87    Intake/Output Summary (Last 24 hours) at 10/09/2022 1254  Last data filed at 10/09/2022 0920  Gross per 24 hour   Intake 1040 ml   Output 1320 ml   Net -280 ml     Wt Readings from Last 3 Encounters:   10/08/22 204 lb 2.3 oz (92.6 kg)       Physical Exam     Physical Exam  Constitutional:       Appearance: Normal appearance.   HENT:      Head: Normocephalic and atraumatic.   Cardiovascular:      Rate and Rhythm: Normal  rate and regular rhythm.   Pulmonary:      Effort: Pulmonary effort is normal.      Breath sounds: Normal breath sounds.   Abdominal:      General: There is no distension.      Palpations: Abdomen is soft.      Tenderness: There is no abdominal tenderness.   Musculoskeletal:      Right lower leg: No edema.      Left lower leg: No edema.      Comments: Some edema of L hand, per patient he retains fluid in hands and face > LE usually.   Skin:     General: Skin is warm and dry.      Comments: Tunneled line in place on chest, (-) drainage, erythema, bleeding   Neurological:      General: No focal deficit present.      Mental Status: He is alert and oriented to person, place, and time.         Laboratory Data  Pertinent labs reviewed and notable for:  Electrolytes wnl  Cr 9.62 (from 14.26) - s/p dialysis  BUN 41 (from 67)  Phosphorus 5.4 (from 6.6)    Diagnostic Studies     X-ray Portable Chest  Narrative: EXAM: XR PORTABLE CHEST     INDICATION: Chest pain, unspecified,  new HD line placed by IR 7/15 with associated swelling and pain     TECHNIQUE: 2 views of the chest.     COMPARISON: 09/30/2022     FINDINGS:  Medical Devices: Left-sided tunneled dialysis catheter tip overlies the superior atrial caval junction.    Heart and Mediastinum: Cardiomediastinal silhouette is within normal limits.    Lungs and Pleura: Hypoexpanded lungs with mild bibasilar parenchymal opacities, likely atelectasis. No evidence for pneumothorax.    Bones and Soft tissues: No acute abnormalities.  Impression: IMPRESSION:   No acute cardiopulmonary abnormality.    Report Verified by: Ezzie Dural, DO at 10/09/2022 12:31 AM EDT        Assessment & Plan     Roberto Rice is a 37 y.o. with a history of kidney transplant in 2018 (ERSD due to HTN) and HTN, presenting as transfer from OSH for AKI in setting of acute transplant rejection.    Principal Problem:   AKI  Active Problems:   Acute transplant rejection   Kidney transplant (2018)   Diarrhea (C  Diff +)   Hypertension    #AKI   #Kidney transplant in 2018  #Acute transplant rejection  Cr elevated from baseline 2.4-2.7 per OSH records. Renal bx with IR 7/8 showed acute rejection. Continues to have good UOP.   - transplant renal consulted  - tacro 5mg  AM/6mg  PM (goal level 6-8), mycophenolate 360mg  BID, pred 5mg    - S/p ATG, methylpred x2 for acute rejection, hold given per renal  - Valcyte ppx 100mg  oral solution MWF. Holding Bactrim ppx given AKI, on atovaquone 1.5g. Will need for 3 months given thymo  - hold torsemide 80mg  BID with diarrhea   - No HD today  - Communicating with [facility] and OP General Nephrology to set up outpatient f/u  - Ok to discharge   - IR placed tunneled dialysis catheter 7/15  - PCP follow up scheduled    Diarrhea - improving  Pt presenting with 1 week of nonbloody diarrhea and decreased PO intake. Previously admitted for diarrhea in 07/2022 with C diff colonization and norovirus. Pt has remained afebrile, WBC wnl. C diff with positive DNA but negative toxin, will treat given immunosuppressed.    - Continue Fidaxomicin 200mg  BID x 10d (EOT 7/22)     - consult GI               - hold off on EGD and flex sig               - celiac serologies               - AM cortisol wnl       #HTN  Transferred with hypertensive urgency on nitro gtt, now resolved. BP elevated to 150/100s.   - goal BP 120/80s per transplant renal   - nifedipine ER 60mg  BID, coreg 12.5 BID      #Chronic anemia of CKD   - Hgb stable       #Headache - resolved  Pt developed headache after thymoglobulin infusion 7/9 with some blurry vision. He reports HA feels like his typical migraines. Nothing has helped headache including heat/ice packs, Tylenol,  opiates. Pt does have history of headaches with ATG infusions. Differential also includes hypertensive emergency given vision changes, increased ICP due to steroids.   - CT head negative for acute lesions     Nutrition:   Diet/Nutrition Orders    Diet renal     Frequency: Effective  Now     Number of Occurrences: Until Specified     Order Questions:      Suicide/Behavior Risk Modification? No    Dietary nutrition supplements     Frequency: BID     Number of Occurrences: Until Specified     Order Questions:      Select Supplement: Novasource Renal- renal oral supplement       Code Status: Full Code    Signed:  Fredric Dine, DO (PGY-1)  Physical Medicine & Rehabilitation Resident  H5 Team  10/09/2022 12:54 PM

## 2022-10-10 MED FILL — TACROLIMUS 1 MG CAPSULE, IMMEDIATE-RELEASE: 1 1 MG | ORAL | Qty: 1

## 2022-10-10 MED FILL — CARVEDILOL 6.25 MG TABLET: 6.25 6.25 MG | ORAL | Qty: 2

## 2022-10-10 MED FILL — MEPRON 750 MG/5 ML ORAL SUSPENSION: 750 750 mg/5 mL | ORAL | Qty: 10

## 2022-10-10 MED FILL — MYFORTIC 360 MG TABLET,DELAYED RELEASE: 360 360 mg | ORAL | Qty: 1

## 2022-10-10 NOTE — Telephone Encounter (Signed)
Appeal request for [employer] requested. Insurance approved medication through 11/08/2022. Medication Discharge Service team informed. Please click on Rx Prior Authorization below for details.

## 2022-10-12 NOTE — Progress Notes (Signed)
Formatting of this note might be different from the original.  Pt up to the bathroom and agreed to standing weight.  Electronically signed by Blair Heys, RN at 10/12/2022 10:55 AM EDT

## 2022-10-12 NOTE — Consults (Signed)
Associated Order(s): IP CONSULT TO VASCULAR SURGERY  Formatting of this note is different from the original.  Images from the original note were not included.                     Vascular Consultation     Onalee Hua L. Bickett, APRN, FNP-C scribing for Dr. Theresa Duty, MD. Information in ths note was obtained by Dr. Marvetta Gibbons. He was present in the room and examined the patient.     Requesting Physician:  Derek Mound, MD     Reason for Consultation:  AKI    History of Present Illness:  Patient is a 37 y.o. African American male with past medical history significant for ESRD, Renal transplant, AKI, HTN, HLD. The patient recently had a tunneled HD catheter placement at an OSH. The patient now presents to the ED for missing multiple HD sessions. Patient also has bleeding around his catheter insertion site. Vascular surgery requested to evaluate the tunneled HD catheter. Patient seen and examined. Moderate amount of bloody drainage present around the tunneled HD catheter insertion site. No erythema or purulent drainage present.     Past Medical History:  Patient  has a past medical history of Blood transfusion, without reported diagnosis, Hemodialysis, Hyperlipidemia, Hypertension, and Kidney disease (approx 4 weeks).    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    Family History:   Patient's family history includes Hypertension in his father and mother.     Social History:  He  reports no history of alcohol use.  He  reports current drug use. Drug: Marijuana.  He  reports that he has never smoked. He does not have any smokeless tobacco history on file.    Allergies:   Nsaids (non-steroidal anti-inflammatory drug)    Medications:    sodium chloride flush  10 mL Intravenous FLUSH    NIFEdipine  60 mg Oral BID    fidaxomicin  200 mg Oral BID    sodium chloride flush  10 mL Intravenous Q8H    -ORDER CLARIFY- 1 Each  1 Each NOT APPLICABLE CLARIFY    [START ON 10/13/2022] tacrolimus  5 mg Oral QAM WITH BREAKFAST    tacrolimus  6 mg Oral QHS    carvediloL  12.5 mg Oral BID    famotidine  20 mg Oral DAILY    atovaquone  1,500 mg Oral QAM WITH BREAKFAST     Review of Systems:  Constitutional:  Negative for fevers, chills or weight loss  Respiratory:  Negative for cough, shortness of breath or sleep apnea  Cardiovascular:  Negative for chest pain, orthopnea, paroxysmal nocturnal dyspnea or palpitations  Gastrointestinal:  Negative for abdominal pain, nausea, vomiting or diarrhea  Genitourinary:  Positive for dysuria, hematuria or history of renal failure  Integument/breast:  Negative for rash or skin lesion(s)    Physical Exam:  Last Recorded Vital Signs:  Temp:  (pt otf)  Pulse: 81  BP: 148/85  Respirations: 18  SpO2: 100 %  General Appearance: alert, no distress  Skin:  Tunneled HD catheter in place. No erythema. Moderate amount of bloody drainage coming from insertion site.   Neuro: grossly normal    Imaging:  None reviewed    Laboratory  Data:   CBC:   Recent Labs     10/12/22  0757   WBC 7.1   RBC 2.88*   HGB 9.0*   HCT 26.2*   MCV 90.9   MCH 31.3   MCHC 34.4   RDW 14.6   MPV 7.4   PLATELETCNT 310     BMP:   Recent Labs     10/12/22  0757   SODIUM 135   POTASSIUM 4.0   CHLORIDE 101   CO2 19*   GLUCOSE 79   BUN 64*   CREATININE 14.1*   CALCIUM 9.0   OSMOLALITY 287   BC 5*     Coagulation:   No results for input(s): APTT, PROTIME, INR in the last 72 hours.    Impression:    AKI on CKD  Hx renal transplant    Recommendation:    Patient with bloody drainage from catheter insertion site. Explained to the patient that this is likely due to him missing multiple HD session, which has an  impact on his platelet function.   Discussed with the patient that suture can be placed around insertion site to aid with hemostasis but that he still need to dialyze to resolve platelet inhibition. Patient verbalized understanding and agreed to suture placement.   Suture placed to the catheter insertion site by. Dr. Marvetta Gibbons. Patient tolerated well.  Instructed patient to hold pressure on site for 15 minutes to aid with hemostasis.   No further vascular intervention indicated at this time.     Patient and/or family was updated on diagnoses, procedures, tests, results, and plan of care.  Please contact Vascular Surgery with any questions or concerns.     Thank you for this consultation.    Signed:  Selinda Eon, APRN  10/12/2022  3:03 PM      Electronically signed by Theresa Duty, MD at 10/12/2022  6:28 PM EDT    Associated attestation - Theresa Duty, MD - 10/12/2022  6:28 PM EDT  Formatting of this note might be different from the original.  Images from the original note were not included.  Entered by Provider:                                                                         Dallas Breeding, MD, attest that the physician assistant was acting in a scribe capacity, has observed my physical examination and has documented them in accordance with my direction.    Signed:    Theresa Duty, MD, RPVI

## 2022-10-12 NOTE — Progress Notes (Signed)
Formatting of this note might be different from the original.  Patient was seen and examined on hemodialysis. Patient is tolerating hemodialysis well.    VSS  Gen: Patient is alert, oriented, NAD  Chest: CTAB  LE: no edema     Access:    QB/QD: 400/800.  UF goal keep even  K bath 3 K     AKI/CKLD3A-HD dependent   Patient will continue hemodialysis therapy on  outpatient regimen on MWF while hospitalized.   Electronically signed by Brion Aliment, MD at 10/12/2022 12:55 PM EDT

## 2022-10-12 NOTE — Assessment & Plan Note (Signed)
Associated Problem(s): Renal transplant, status post  Formatting of this note might be different from the original.  Description of the Problem/Progression: chronic, 2018 in [place]  Plan: Continue home meds  Electronically signed by Eyvonne Mechanic, APRN at 10/12/2022 11:14 AM EDT

## 2022-10-12 NOTE — Unmapped (Signed)
Formatting of this note might be different from the original.  Roberto Rice    Test Name: creatinine 14.1  Results:    10/12/2022  Time: 0828  Received from: lab  R/V by: mj    (Required: Attempt notification within 30 minutes or document reason for no notification)    Physician: PK  10/12/2022  Time: 0828  Notified: Yes - No orders received  Electronically signed by Loraine Grip, RN at 10/12/2022  9:09 AM EDT

## 2022-10-12 NOTE — ED Notes (Signed)
Formatting of this note might be different from the original.  Pt to ed for bleeding around dialysis port. Pressure dressing applied to port. VSS, NADN, CAOx3. Call light within reach.   Electronically signed by Freada Bergeron, RN at 10/12/2022  8:51 AM EDT

## 2022-10-12 NOTE — Assessment & Plan Note (Signed)
Associated Problem(s): Problem with dialysis access (CMS/HCC)  Formatting of this note might be different from the original.  Description of the Problem/Progression: New, bleeding from site  Plan: Admit. Additional suture and dressing placed in ED by Vascular Surgery. Nephrology consulted and plans for HD today. Continue home meds.   Electronically signed by Eyvonne Mechanic, APRN at 10/12/2022 11:14 AM EDT

## 2022-10-12 NOTE — Progress Notes (Signed)
Formatting of this note might be different from the original.  Refuses Candida Auris culture  Electronically signed by Blair Heys, RN at 10/12/2022  4:54 PM EDT

## 2022-10-12 NOTE — Assessment & Plan Note (Signed)
Associated Problem(s): Anemia  Formatting of this note might be different from the original.  Description of the Problem/Progression: chronic  Plan: Daily CBC. Monitor for bleeding  Electronically signed by Eyvonne Mechanic, APRN at 10/12/2022 11:13 AM EDT

## 2022-10-12 NOTE — Progress Notes (Signed)
Formatting of this note might be different from the original.  Pt in room from ED. AAO x 3. States his dialysis cath site has been bleeding. Site assessed and small amount of blood noted to dialysis cath dressing. Pt states it burns and itches. Assessment obtained with vitals. Environment scanned for optimal pt safety. Call light in reach. Wife at bedside.  Electronically signed by Blair Heys, RN at 10/12/2022  3:29 PM EDT

## 2022-10-12 NOTE — Telephone Encounter (Signed)
Formatting of this note might be different from the original.  Rx refill.  Electronically signed by Delfina Redwood, APRN at 10/12/2022  2:35 PM EDT

## 2022-10-12 NOTE — Assessment & Plan Note (Signed)
Associated Problem(s): Acute kidney injury superimposed on CKD (CMS/HCC)  Formatting of this note might be different from the original.  Description of the Problem/Progression: Recent, requiring re initiation of HD  Plan: Dialysis per Nephrology   Electronically signed by Eyvonne Mechanic, APRN at 10/12/2022 11:13 AM EDT

## 2022-10-12 NOTE — Progress Notes (Signed)
Formatting of this note might be different from the original.  Report from [place], ED:pt was here at [facility] on 09/29/2022 in ICU. Admitted for ARF today with bleeding dialysis catheter. Alert and oriented x 3. No medications given. VS: BP167/107, P79,R16,T98.3. O2 sat 100% RA. 20 gauge Left AC. Creat 14.1, BUN 64. Not sure of dialysis days.  Electronically signed by Blair Heys, RN at 10/12/2022  9:08 AM EDT

## 2022-10-12 NOTE — H&P (Signed)
Formatting of this note is different from the original.  [phone]    Pt seen initially by Advanced Practice Provider, discussed with me, and then I independently evaluated the patient, reviewed labs, imaging, medications and performed a physical exam. A summary is documented below.    Subjective: Pt presented to [facility] ED with oozing from his HDL access site. Stitch was placed which resolved this issue. Plan run HD today.    Exam:    General:   NAD    CVS:   Regular rate    RESP:   No tachypnea    ABD:   Nondistended    SKIN:   No jaundice noted    EXT:   No pitting edema    NEURO:   Alert; No focal deficits    Principal Problem:    Problem with dialysis access (CMS/HCC)  Active Problems:    Acute kidney injury superimposed on CKD (CMS/HCC)    Renal transplant, status post    Anemia    Clostridium difficile colitis    Plan:      #Problem with HD Access  -Oozing resolved with stitch placement    #AKI on CKD  -Requiring HD  -M, W, F  -Ran today w/o issue    #C.diff  -Fidaxomicin    Pt can DC home with HD f/u Monday    I individually spent more than half of the total time (20 minutes) needed to generate this split-share documentation. I performed an independent review of the records before the face-to-face encounter and I independently collected history and examined the patient. The medical decision making was generated largely by me based on my own review of the findings, face-to-face time with patient and/or family, collaboration with specialists and reviewing the clinical, laboratory and imaging facts to generate a care plan.    Earlie Server, DO  10/12/2022  4:56 PM    Hospital Medicine Admission History & Physical    Patient: Roberto Rice  DOB: 1985/04/30  MRN: [id number]  Date: 10/12/2022  Room: 0N027/2Z366Y  PCP: No primary care provider on file.    Chief Complaint:  HD cath bleeding    History Of Present Illness:  Roberto Buonocore is a 37 y.o. male who presents to ED with c/o bleeding from L CW dialysis cathter  that began today. Has h/o renal failure, recent restarted on HD following failed transplant. Transplant done in [place] in 2018. HD restarted at [facility] and has L CW HD cath. States he had treatment via catheter prior to discharge home. He missed HD Wednesday and today. Recently had Cdiff + at [facility] and is on treatment w/improving symptoms. Vascular Surg placed additional suture and dressing at HD cath insertion site. D/w Dr. Randall An and will admit for HD treatment.     Past Medical History:  Patient  has a past medical history of Blood transfusion, without reported diagnosis, Hemodialysis, Hyperlipidemia, Hypertension, and Kidney disease (approx 4 weeks).    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    Family History:   Patient's family history includes Hypertension in his father and mother.       Social History:  He  reports no history of alcohol use.  He  reports current drug  use. Drug: Marijuana.  He  reports that he has never smoked. He does not have any smokeless tobacco history on file.     Allergies:  Nsaids (non-steroidal anti-inflammatory drug)    PTA Medications:    Prior to Admission Medications   Prescriptions Last Dose Informant Patient Reported? Taking?   ISOSORBIDE PO   Yes Yes   Sig: Take  by mouth.   NIFEdipine 60 mg ER tablet   Yes Yes   Sig: Take 60 mg by mouth Twice a day.   cholecalciferol, vitamin D3, (VITAMIN D3) Tab tablet   Yes Yes   Sig: Take 2,000 Units by mouth Every morning.   fidaxomicin (DIFICID) 200 mg TABLET   Yes Yes   Sig: Take 200 mg by mouth Twice a day. X 8 days  Indications: diarrhea from an infection with Clostridium difficile bacteria    mycophenolate sodium (MYFORTIC) 180 mg DR tablet   No Yes   Sig: Take 3 Tabs by mouth Twice a day for 90 days.   Patient taking differently: Take 360 mg by mouth Twice a day.   predniSONE (DELTASONE) 10 mg tablet   Yes Yes   Sig: Take 10 mg by mouth Once Daily.   sulfamethoxazole-trimethoprim (BACTRIM) 400-80 mg per tablet   Yes Yes   Sig: Take 1 Tablet by mouth Monday, Wednesday and Friday.   tacrolimus (PROGRAF) 1 mg   Yes Yes   Sig: Take 4 mg by mouth At bedtime.   tacrolimus (PROGRAF) 5 mg   Yes No   Sig: Take 4 mg by mouth once daily with breakfast.   vancomycin (VANCOCIN) 125 mg Cap   Yes Yes   Sig: Take 125 mg by mouth Every 6 hours. X 10 days     Facility-Administered Medications: None     Review of Systems:   Review of Systems   Constitutional:  Negative for fever.   HENT:  Negative for congestion.    Respiratory:  Negative for cough and shortness of breath.    Cardiovascular:  Negative for chest pain.   Gastrointestinal:  Negative for abdominal pain.   Genitourinary:  Negative for dysuria.   Musculoskeletal:  Negative for arthralgias.   Skin:  Negative for rash.        +bleeding from dialysis catheter   Neurological:  Negative for weakness.   Psychiatric/Behavioral:  Negative for agitation.      Physical Exam:  Blood pressure (!) 180/94, pulse 91, temperature 97.5 F (36.4 C), temperature source Oral, resp. rate 16, weight 90.4 kg (199 lb 6.4 oz), SpO2 100%.  Physical Exam  Vitals and nursing note reviewed.   Constitutional:       Appearance: He is well-developed. He is not ill-appearing.   HENT:      Head: Normocephalic.   Eyes:      Pupils: Pupils are equal, round, and reactive to light.   Cardiovascular:      Rate and Rhythm: Normal rate and regular rhythm.   Pulmonary:      Effort: Pulmonary effort is normal.      Breath sounds: Normal breath sounds.   Abdominal:      General: Bowel sounds are normal.      Palpations: Abdomen is soft.   Musculoskeletal:         General: Normal range of motion.       Cervical back: Neck supple.   Skin:     General: Skin is warm and dry.  Comments: +LCW tunneled HD cath w/mild bleeding at insertion site.    Neurological:      Mental Status: He is alert and oriented to person, place, and time.     Labs:    CBC:   Lab Results   Component Value Date/Time    WBC 7.1 10/12/2022 0757    RBC 2.88 10/12/2022 0757    HGB 9.0 10/12/2022 0757    HCT 26.2 10/12/2022 0757    MCV 90.9 10/12/2022 0757    MCH 31.3 10/12/2022 0757    MCHC 34.4 10/12/2022 0757    RDW 14.6 10/12/2022 0757    MPV 7.4 10/12/2022 0757    PLATELETCNT 310 10/12/2022 0757     CMP:   Lab Results   Component Value Date/Time    SODIUM 135 10/12/2022 0757    POTASSIUM 4.0 10/12/2022 0757    CHLORIDE 101 10/12/2022 0757    CO2 19 10/12/2022 0757    GLUCOSE 79 10/12/2022 0757    BUN 64 10/12/2022 0757    CREATININE 14.1 10/12/2022 0757    CALCIUM 9.0 10/12/2022 0757    PROTEINTOTAL 7.7 10/12/2022 0757    TBILIRUBIN 0.6 10/12/2022 0757    ALP 61 10/12/2022 0757    AST 18 10/12/2022 0757    ALTSGPT 27 10/12/2022 0757    ALBUMIN 3.9 10/12/2022 0757    AGRATIO 1.0 10/12/2022 0757     Imaging:   Reviewed    ASSESSMENT/PLAN:     Problems and Relevant Co-morbid conditions::  Principal Problem:    Problem with dialysis access (CMS/HCC)  Active Problems:    Acute kidney injury superimposed on CKD (CMS/HCC)    Renal transplant, status post    Anemia    Clostridium difficile colitis    * Problem with dialysis access (CMS/HCC)  Description of the Problem/Progression: New, bleeding from site  Plan: Admit. Additional suture and dressing placed in ED by Vascular Surgery. Nephrology consulted and plans for HD today. Continue home meds.     Clostridium difficile colitis  Description of the Problem/Progression: Recent, diagnosed at [facility]  Plan: Continue po Vanc and Dificid to complete course    Anemia  Description of the Problem/Progression: chronic  Plan: Daily CBC. Monitor for bleeding    Renal transplant, status post  Description of the  Problem/Progression: chronic, 2018 in [place]  Plan: Continue home meds    Acute kidney injury superimposed on CKD (CMS/HCC)  Description of the Problem/Progression: Recent, requiring re initiation of HD  Plan: Dialysis per Nephrology      Patient and/or family was updated on diagnoses, procedures, tests, results, and plan of care.     DVT ppx - TEDs    I, individually, spent less than half of the total split-share visit time (12 minutes) needed to collect history, review the records and examine the patient.    ---  Signed,  Eyvonne Mechanic, APRN  10/12/2022          Electronically signed by Earlie Server, DO at 10/12/2022  5:00 PM EDT

## 2022-10-12 NOTE — Assessment & Plan Note (Signed)
Associated Problem(s): Clostridium difficile colitis  Formatting of this note might be different from the original.  Description of the Problem/Progression: Recent, diagnosed at [facility]  Plan: Continue po Vanc and Dificid to complete course  Electronically signed by Eyvonne Mechanic, APRN at 10/12/2022 11:22 AM EDT

## 2022-10-12 NOTE — Progress Notes (Signed)
Formatting of this note might be different from the original.  IV removed. Discharge instructions and prescriptions reviewed with patient. He verbalized understanding. Wife transporting him home.   Electronically signed by Blair Heys, RN at 10/12/2022  5:15 PM EDT

## 2022-10-12 NOTE — ED Provider Notes (Signed)
Formatting of this note is different from the original.  Schyler, Mckeen [[id number]] [facility]) - 37 y.o.  Note Creation:10/12/2022  Encounter Date:10/12/2022      History     Chief Complaint   Patient presents with    Vascular Access Problem     Roberto Rice is a 37 y.o. male with hx of HLD, HTN, renal transplant who presents to the ED via PV with significant other at bedside with c/o bleeding from left chest wall dialysis access site that began this morning. He states he had a renal transplant at [facility] in 2018. 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 He states he had the temporary dialysis catheter placed in [name] and last received dialysis Monday at [facility]; pt missed dialysis this past Wednesday and today. He states he has no other complaints. He reports he had been following with Dr. Astrid Drafts, Nephrology. He denies fever, chills, CP, SOB, abd pain, or any other pertinent symptoms or concerns. He denies any other aggravating or alleviating factors. He denies any other pertinent PMHx.    The history is provided by the patient, medical records and a significant other.     Past Medical History:   Diagnosis Date    Blood transfusion, without reported diagnosis     Hemodialysis     m-w-f  in [place]    Hyperlipidemia     Hypertension     Kidney disease approx 4 weeks    esrd dialysis m- w- f [place]     Past Surgical History:   Procedure Laterality Date    AV FISTULOGRAM N/A 10/04/2011    AV FISTULOGRAM performed by Audelia Acton, MD at [facility] VASCULAR LABS    EMBOLIZATION Right 10/04/2011    EMBOLIZATION performed by Audelia Acton, MD at [facility] VASCULAR LABS    HX ARTERIO-VENOUS FISTULA Right 08/16/2011    ARTERIO-VENOUS FISTULA performed by Audelia Acton, MD at [facility] CVOR    HX BROKEN/FX BONES      lt ankle ten pins and a plate    HX COLONOSCOPY N/A 07/01/2022    COLONOSCOPY /C ARGON PLASMA COAGULATION  performed by Princess Bruins, MD at [facility] MAIN OR    HX OTHER SURGICAL HISTORY      tunnel cath 06/2011 [facility]    HX ULTRASOUND, INTRAOPERATIVE Right 08/16/2011    ULTRASOUND, INTRAOPERATIVE performed by Audelia Acton, MD at [facility] CVOR    TUNNEL CATH PLACEMENT N/A 08/02/2011    TUNNEL CATH PLACEMENT performed by Audelia Acton, MD at [facility] VASCULAR LABS    VENOUS PTA Right 10/04/2011    VENOUS PTA performed by Audelia Acton, MD at [facility] VASCULAR LABS     Family History   Problem Relation Age of Onset    Hypertension Mother     Hypertension Father      Social History     Tobacco Use    Smoking status: Never    Smokeless tobacco: Not on file   Vaping Use    Vaping Use: Never used   Substance Use Topics    Alcohol use: No    Drug use: Yes     Types: Marijuana     Patient is not a tobacco user.    No LMP for male patient.    Allergies   Allergen Reactions    Nsaids (Non-Steroidal Anti-Inflammatory Drug) Other (See Comments)     CANNOT TAKE DUE TO  KIDNEY FAILURE   CANNOT TAKE DUE TO KIDNEY FAILURE       Current Outpatient Medications on File Prior to Encounter   Medication Sig    vancomycin (VANCOCIN) 125 mg Cap Take 125 mg by mouth Every 6 hours. X 10 days    atovaquone (MEPRON) 750 mg/5 mL suspension Take 1,500 mg by mouth once daily with breakfast. SHAKE GENTLEY BEFORE USE. DO NOT FREEZE! MUST BE TAKEN WITH FOOD!    carvediloL (COREG) 12.5 mg tablet Take 12.5 mg by mouth Twice a day.    famotidine (PEPCID) 20 mg tablet Take 20 mg by mouth Once Daily.    simethicone (MYLICON) 80 mg chewable tablet Take 80 mg by mouth Four times a day as needed for Flatulence.    tacrolimus (PROGRAF) 1 mg Take 6 mg by mouth At bedtime.    mycophenolate sodium (MYFORTIC) 180 mg DR tablet Take 3 Tabs by mouth Twice a day for 90 days. (Patient taking differently: Take 360 mg by mouth Twice a day.)    tacrolimus (PROGRAF) 1 mg Take 5 mg by mouth Every morning.    cholecalciferol, vitamin D3, (VITAMIN D3) Tab tablet Take 2,000 Units by mouth Every  morning.    NIFEdipine 60 mg ER tablet Take 60 mg by mouth Twice a day.    fidaxomicin (DIFICID) 200 mg TABLET Take 200 mg by mouth Twice a day. X 8 days  Indications: diarrhea from an infection with Clostridium difficile bacteria       Review of Systems     Review of Systems   Constitutional:  Negative for appetite change, chills and fever.   HENT:  Negative for facial swelling.    Eyes:  Negative for discharge.   Respiratory:  Negative for cough, shortness of breath and wheezing.    Cardiovascular:  Negative for chest pain.   Gastrointestinal:  Negative for abdominal pain.   Endocrine: Negative for cold intolerance.   Genitourinary:  Negative for flank pain.   Musculoskeletal:  Negative for joint swelling.   Skin:  Negative for rash.   Allergic/Immunologic: Positive for immunocompromised state.   Neurological:  Negative for syncope.   Hematological:  Negative for adenopathy.   Psychiatric/Behavioral:  Negative for confusion.    All other systems reviewed and are negative.    Physical Exam     ED Triage Vitals   BP BP Manual or Automatic? Patient Position BP Location Heart Rate (Monitor)   10/12/22 0725 10/12/22 0725 10/12/22 0725 10/12/22 0725 --   (!) 167/107 Automatic Sitting Left Arm      Pulse Pulse Source Respirations Temp Temp Source   10/12/22 0725 -- 10/12/22 0725 10/12/22 0848 10/12/22 0725   79  16 98.3 F (36.8 C) Oral     SpO2 SPO2 Location O2 Delivery O2 Device O2 Flow Rate (l/min)   10/12/22 0725 10/12/22 0725 10/12/22 0725 10/12/22 0955 --   100 % Right Arm Room air None (Room air)      FIO2 (%) Pain Intensity 1 Exacerbated By Relieved By Quality   -- 10/12/22 1018 -- -- --    5        Duration       --           Physical Exam  Vitals and nursing note reviewed.   Constitutional:       General: He is not in acute distress.     Appearance: He is well-developed. He is not ill-appearing, toxic-appearing or diaphoretic.  Interventions: He is not intubated.     Comments: PLEASANT AAM    HENT:       Head: Normocephalic and atraumatic.      Right Ear: External ear normal.      Left Ear: External ear normal.      Nose: Nose normal. No congestion or rhinorrhea.      Mouth/Throat:      Pharynx: No oropharyngeal exudate or posterior oropharyngeal erythema.   Eyes:      General: No scleral icterus.        Right eye: No discharge.         Left eye: No discharge.      Conjunctiva/sclera: Conjunctivae normal.      Pupils: Pupils are equal, round, and reactive to light.   Neck:      Vascular: No JVD.      Trachea: No tracheal deviation.   Cardiovascular:      Rate and Rhythm: Normal rate and regular rhythm.      Heart sounds: Normal heart sounds. No murmur heard.     No friction rub.   Pulmonary:      Effort: Pulmonary effort is normal. No accessory muscle usage or respiratory distress. He is not intubated.      Breath sounds: Normal breath sounds. No stridor. No wheezing or rhonchi.   Chest:      Chest wall: No mass or deformity.      Comments: Dried blood around insertion site of left chest wall dialysis temp catheter.  Abdominal:      General: There is no distension.      Palpations: Abdomen is soft. There is no mass.      Tenderness: There is no abdominal tenderness.   Musculoskeletal:         General: No swelling, tenderness, deformity or signs of injury. Normal range of motion.      Cervical back: Normal range of motion and neck supple. No rigidity or tenderness.      Right lower leg: No edema.      Left lower leg: No edema.   Lymphadenopathy:      Cervical: No cervical adenopathy.   Skin:     General: Skin is warm and dry.      Capillary Refill: Capillary refill takes less than 2 seconds.      Coloration: Skin is not cyanotic, jaundiced or pale.      Findings: No bruising, erythema, lesion or rash.   Neurological:      General: No focal deficit present.      Mental Status: He is alert and oriented to person, place, and time. Mental status is at baseline.      Cranial Nerves: No cranial nerve deficit.      Sensory:  No sensory deficit.      Motor: No weakness.   Psychiatric:         Mood and Affect: Mood normal.         Behavior: Behavior normal.     MDM         Treatment:  Procedures    Medications   sodium chloride flush 0.9 % syringe 10 mL (has no administration in time range)   NS (sodium chloride 0.9%) IV infusion (has no administration in time range)   albumin human 25 % vial 12.5 g (has no administration in time range)   hePARin (porcine) 5,000 unit/mL injection for catheter 1.7 mL (has no administration in time range)   hePARin (  porcine) 5,000 unit/mL injection for catheter 1.7 mL (has no administration in time range)     Results for orders placed or performed during the hospital encounter of 10/12/22   CBC w/Differential   Result Value Ref Range    WBC 7.1 4.5 - 11.0 10*3/uL    RBC 2.88 (L) 4.50 - 5.90 10*6/uL    HGB 9.0 (L) 13.5 - 17.5 g/dL    HCT 13.0 (L) 86.5 - 53.0 %    MCV 90.9 80.0 - 100.0 fL    MCHC 34.4 32.0 - 36.0 g/dL    MCH 78.4 69.6 - 29.5 pg    RDW 14.6 10.7 - 18.7 %    MPV 7.4 6.5 - 10.0 fL    Platelet Cnt 310 150 - 450 10*3/uL    Differential Type Auto     Neutrophils 79.0 (H) 35.0 - 66.0 %    Lymphocytes 6.9 (L) 24.0 - 44.0 %    Monocytes 10.9 2.1 - 13.3 %    Eosinophils 2.5 0.3 - 5.0 %    Basophils 0.7 0.0 - 1.0 %    Neutrophils Abs 5.6 1.5 - 8.5 10*3/uL    Lymphocytes Abs 0.5 (L) 1.1 - 5.0 10*3/uL    Monocytes Abs 0.8 0.0 - 1.4 10*3/uL    Eosinophils Abs 0.2 0.0 - 0.5 10*3/uL    Basophils Abs 0.1 0.0 - 0.1 10*3/uL    MDW 16.9 0.0 - 20.0   Comprehensive Metabolic Panel   Result Value Ref Range    SODIUM 135 135 - 145 mmol/L    POTASSIUM 4.0 3.6 - 5.0 mmol/L    CHLORIDE 101 101 - 111 mmol/L    CO2 19 (L) 21 - 31 mmol/L    ANION GAP 15     GLUCOSE 79 70 - 110 mg/dL    CREATININE 28.4 (HH) 0.6 - 1.2 mg/dL    BUN 64 (H) 2 - 32 mg/dL    CALCIUM 9.0 8.5 - 13.2 mg/dL    PROTEIN TOTAL 7.7 6.1 - 7.8 g/dL    Albumin 3.9 3.2 - 5.0 g/dL    T BILIRUBIN 0.6 0.2 - 1.0 mg/dL    ALP 61 42 - 440 [iU]/L    AST 18 10 - 42  [iU]/L    ALT (SGPT) 27 10 - 60 [iU]/L    OSMOLALITY 287 266 - 309    A/G Ratio 1.0     B/C 5 (L) 10 - 20    ESTIMATED GFR 4 mL/min   Phosphorus   Result Value Ref Range    PHOSPHORUS 7.2 (H) 2.5 - 4.6 mg/dL       Results    None      Consult:  1027: I spoke with Dr. Astrid Drafts, Nephrology, about the pt's history of present illness, physical examination and course in the ED.    [phone]: I spoke with Gust Rung with Dr. Marvetta Gibbons, Vascular, about the pt's history of present illness, physical examination and course in the ED.    I spoke with Eyvonne Mechanic, APRN, per Dr. Arlyss Repress, Hospitalist, about the pt's history of present illness, physical examination and course in the ED. Dr. Arlyss Repress accepts pt for admission.    Plan:    [facility] ED RECHECK: Admit: The pt is awake and alert at time of reevaluation. I spoke with the patient about his ED work up and diagnosis. I informed the patient that he will be admitted for further evaluation/treatment. All questions answered. The pt is agreeable.  Medical Decision Making  DIFFERENTIAL DIAGNOSIS  Differential Diagnosis:  The following diagnoses were considered in the evaluation of this patient: bleeding dialysis access site, hx renal transplant with acute renal failure on dialysis, hyperkalemia.    Problems Addressed:  Acute on chronic renal failure (CMS/HCC): acute illness or injury    Amount and/or Complexity of Data Reviewed  Independent Historian:      Details: Significant other  External Data Reviewed: labs and notes.  Labs: ordered.    Risk  Prescription drug management.  Decision regarding hospitalization.      Progress Note:        ED Prescriptions    None      Final diagnoses:   Acute on chronic renal failure (CMS/HCC) - ON DIALYSIS WITH HX RENAL TRANSPLANT        ED Disposition       ED Disposition   Admitted    Condition   --    Comment   Bed Reason: Medical Necessity [2]            Scribe Attestation:  I, Jeralene Huff, am acting as scribe for and in the presence of  Derek Mound, MD.  Electronically Signed By Jeralene Huff 10/12/22 8:05 AM    Provider Attestation:  I personally performed the services described in the documentation, reviewed the documentation recorded by the scribe in my presence and it accurately and completely records my words and actions.   Electronically Signed By Derek Mound, MD    Electronically signed by Derek Mound, MD at 10/12/2022  3:49 PM EDT

## 2022-10-12 NOTE — Progress Notes (Signed)
Formatting of this note might be different from the original.  Pt requests to ambulate to exit.  Electronically signed by Blair Heys, RN at 10/12/2022  5:35 PM EDT

## 2022-10-12 NOTE — Unmapped (Signed)
Formatting of this note might be different from the original.    THIS MEDICATION RECONCILIATION TECHNICIAN  HAS CLARIFIED THE PRIOR TO ADMISSION MEDICATION LIST AND IS READY FOR REVIEW.    PTA LIST #CHANGES: 3    CLARIFICATIONS:  Confirmed doses of transplant meds at request of nephrology and clinical pharmacist. Myfortic is 360mg  BID, Prograf is 4mg  BID, Prednisone is 10mg  daily, Bactrim is M, W, F. Pts spouse reports that she believes he had a double dose of the myfortic this morning PTA because they thought it was antibiotic for c-diff. Updated dose on Imdur to 30mg , this was restarted since discharge from this facility.   No other changes at this time.    ALLERGY RECONCILIATION: No    ADDITIONS:    MARKED FOR REMOVAL:    Prograf duplicate    SOURCE OF INFORMATION:  Spouse Recall, Patient's Pharmacy:   [facility] [phone] [place], [place] - [address] AT [facility] [address] [facility] [address]  [address]  [place] [address]  Phone: [phone] Fax: [phone]  Chart Review, Notes  , and Electronic Prescription Database    Vonna Drafts  (10/12/2022  11:55 AM)    Electronically signed by Lavell Luster, PHARMD at 10/12/2022  1:49 PM EDT    Associated attestation - Lavell Luster, PHARMD - 10/12/2022  1:49 PM EDT  Formatting of this note might be different from the original.  I called the [facility] and [facility] in [place] to verify med changes at [facility] discharge    Patient was started on:  mepron 1500 mg daily with breakfast  Coreg 12.5 mg bid  Pepcid 20 mg daily  Dificid 200 mg bid x8 days  Simethicone 80 mg QID PRN gas  Valcyte 2 ml QMWF  Vanc 125 mg QID x 10 days    Mycophenolate dose was adjusted to 360 mg BID and tacrolimus adjusted to 5 mg every morning and 6 mg every evening    Imdur, potassium chloride, prednisone, and bactrim were all stopped    Patient was unable to pick medications up from [facility] except for mycophenolate due to prescriber Pearlie Oyster not being covered  on the patients insurance.    I updated the PTA med list myself and adjusted inpatient orders per Adah Salvage NP    Signed:  Lavell Luster, [facility]  10/12/2022  1:49 PM

## 2022-10-12 NOTE — Consults (Signed)
Associated Order(s): IP CONSULT TO NEPHROLOGY  Formatting of this note is different from the original.  Images from the original note were not included.      [facility]  CONSULTATION REPORT    REASON FOR CONSULTATION  Evaluation of dialysis needs while inpatient.    CHIEF COMPLAINT    Chief Complaint   Patient presents with    Vascular Access Problem     HISTORY OF PRESENT ILLNESS    Patient is a 37 y.o. 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 He is on maintenance hemodialysis support at [facility] on a MWF schedule who presented to [facility] on today for issues with his dialysis catheter and having missed HD Wednesday.     Of note, he was recently admitted here in acute renal failure of his transplanted kidney 2/2 known acute rejection and acute diarrheal illness ulimately caused by Cdiff and transferred to [facility] for further treatment. He was restarted on dialysis on 09/29/22 at this facility prior to transfer.     Upon discharge from [facility] on 7/16, he was scheduled for OP HD to start on 7/17. He states there was a miscommunication and he was not informed on when to present to the HD center. Therefore, when he showed up on 7/17, he was not able to run.     Today, he noted oozing from his tunneled cath site and reported to the ED.    He denies any complaints other than that and states that he feels fine.     Nephrology has been consulted to evaluate the patient's needs for renal replacement therapy while inpatient.    REVIEW OF SYSTEMS  ROS review and listed below or negative unless mentioned in HPI.    MEDICAL HISTORY  Past Medical History:   Diagnosis Date    Blood transfusion, without reported diagnosis     Hemodialysis     m-w-f  in [place]    Hyperlipidemia     Hypertension     Kidney disease approx 4 weeks    esrd  dialysis m- w- f [place]     SURGICAL HISTORY  Past Surgical History:   Procedure Laterality Date    AV FISTULOGRAM N/A 10/04/2011    AV FISTULOGRAM performed by Audelia Acton, MD at [facility] VASCULAR LABS    EMBOLIZATION Right 10/04/2011    EMBOLIZATION performed by Audelia Acton, MD at [facility] VASCULAR LABS    HX ARTERIO-VENOUS FISTULA Right 08/16/2011    ARTERIO-VENOUS FISTULA performed by Audelia Acton, MD at [facility] CVOR    HX BROKEN/FX BONES      lt ankle ten pins and a plate    HX COLONOSCOPY N/A 07/01/2022    COLONOSCOPY /C ARGON PLASMA COAGULATION performed by Princess Bruins, MD at [facility] MAIN OR    HX OTHER SURGICAL HISTORY      tunnel cath 06/2011 [facility]    HX ULTRASOUND, INTRAOPERATIVE Right 08/16/2011    ULTRASOUND, INTRAOPERATIVE performed by Audelia Acton, MD at [facility] CVOR    TUNNEL CATH PLACEMENT N/A 08/02/2011    TUNNEL CATH PLACEMENT performed by Audelia Acton, MD at [facility] VASCULAR LABS    VENOUS PTA Right 10/04/2011    VENOUS PTA performed by Audelia Acton, MD at [facility] VASCULAR LABS     MEDICATIONS  Reviewed on Epic.   Medications Prior to  Admission   Medication Sig Dispense Refill Last Dose    mycophenolate sodium (MYFORTIC) 180 mg DR tablet Take 3 Tabs by mouth Twice a day for 90 days. 540 Tablet 0     tacrolimus (PROGRAF) 1 mg Take 6 mg by mouth At bedtime.       sulfamethoxazole-trimethoprim (BACTRIM) 400-80 mg per tablet Take 1 Tablet by mouth Monday, Wednesday and Friday.       cholecalciferol, vitamin D3, (VITAMIN D3) Tab tablet Take 2,000 Units by mouth Every morning.       NIFEdipine 60 mg ER tablet Take 60 mg by mouth Twice a day.       tacrolimus (PROGRAF) 5 mg Take 5 mg by mouth once daily with breakfast.       predniSONE (DELTASONE) 10 mg tablet Take 10 mg by mouth Once Daily.        ALLERGIES    Nsaids (non-steroidal anti-inflammatory drug)    SOCIAL HISTORY  Social History     Socioeconomic History    Marital status: Single   Tobacco Use    Smoking status: Never   Vaping Use    Vaping Use: Never used    Substance and Sexual Activity    Alcohol use: No    Drug use: Yes     Types: Marijuana     FAMILY HISTORY  Family History   Problem Relation Name Age of Onset    Hypertension Mother      Hypertension Father       PHYSICAL EXAM  LAST RECORDED VITAL SIGNS  Temp: 98.3 F (36.8 C)  Pulse: 79  BP: (!) 180/94  Respirations: 16  SpO2: 100 %    GENERAL: Non-toxic appearance. No acute distress.   LUNGS: Respirations even and unlabored. Clear to auscultation bilaterally.   CARDIAC: Normal S1, S2. No rubs or murmur. No lower extremity edema.   NEUROLOGIC: Awake, alert, and oriented. No myoclonus.       ASSESSMENT AND PLAN  1.  AKI/CKD stage 3a s/p DDKT at [facility] in 2018 now on dialysis 2/2 acute rejection of the transplanted kidney. Most recent admission and discharge from [facility] reviewed. Patient is HD-dependent (MWF at [facility]). Reassess daily to determine if the patient needs additional treatments while inpatient. Plan for 4 hours HD today. No UF. Avoid known nephrotoxins including NSAIDs, IV contrast, and MRI gadolinium. Please dose all medications for CrCl <10 ml/min.     2.  Hypertensive Kidney Disease. Blood pressures are above goal for HD with SBP 150-180s. Resume home anti-hypertensives based on most recent DC med list from [facility]. Target SBP around 130-150 mmHg pre-HD.    3.  Anemia of Chronic Disease. Hemoglobin is 9.0. No need for pRBC transfusion.     4.  CKD Bone and Mineral. Follow calcium and phosphorus product. Maintain this below 55. Calcium is 9.0. Check phosphorus. Follow trends.    5.  Metabolic acidosis. 2/2 AKI-D and missed dialysis. CO2 is 19. Should improve with HD.     6.  Acute rejection of transplanted kidney. Pharmacist to confirm current doses of tacrolimus, prednisone, and Myfortic. He will also be continued on Valcyte and Mepron. Will resume these once medication reconciliation is completed. Check tacro level in AM.     7.  Clostridium difficile. On Dificid outpatient. Defer  management to primary team.      Medications, lab results reviewed.    Of note, patient had many questions concerning dialysis and rejection of  his transplanted kidney. All questions/concerns were addressed and answered by Dr Astrid Drafts at bedside in the ED.     Thank you for allowing [place] to participate in the care of Mr. Gest.    Signed:  Delfina Redwood, APRN  10/12/2022  10:02 AM    I individually spent 15 minutes, less than half of the total time needed to generate this split-share documentation.     Nephrology Attending Note:    Patient was seen and examined in ER by me during rounds. Chart, labs, medications and radiology has been reviewed. I have made clinical decisions for management plans based on my personal assessment and I have discussed the management plans in details with NP. Plans discussed with patient during rounding and also with primary team. 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  s/p DDKT at [facility] in 2018 now on dialysis 2/2 . Most recent admission and discharge from [facility] reviewed.   outpatient schedule is MWF, he missed Wed, last HD was on Monday. plan to provide dialysis therapy on his outpatient dialysis days.  We will assess for acute indication for renal replacement therapy on other days. Avoid gadolinium and fleets  Please dose medications for GFR less than 10 mL/ min.    HD today and will keep him even     Lengthy discussion with patient this am as he has concerns that thrice weekly HD and 4 hrs sessions will Jeoprdie his renal recovery chances, discussed with him that so far he has no signs of renal recovery and his GFR is 4 ml/min and it is important that he gets 4 hrs HD for clearance purpoises and that wont affect the chamces of renal  recoveryy and we will be monitoring him closely in OP settings, he agreed to 4 hrs today and 3.5 hrs three times a week in OP settings    Hypertension: Blood pressure less than 140/90. Pressure trends reviewed and blood pressure is above goal     Anemia of chronic kidney disease: Hemoglobin is 9  no acute indication for packed RBCs transfusion.    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 Phos is 7.2 , he missed HD, will recheck in am and if above goal still will consider phos binders    Metabolic acidosis: Serum Bicarb level is 19 should improve with HD    Potassium management: Adjust K+ bath per K+ level with HD.     Oozing from site of perma cath, vascular consulted     Acute rejection of transplanted kidney. Pharmacist to confirm current doses of tacrolimus, prednisone, and Myfortic. He will also be continued on Valcyte and Mepron. Will resume these once medication reconciliation is completed. Check tacro trough level in AM.         I individually spent 20  minutes, more than half of the total time needed to generate this split-share documentation.      Note is generated using voice recognition computer software. Inadvertent transcription errors may have occurred while dictating the note. Common sense approach is appreciated.     Brion Aliment, MD.    Electronically signed by Brion Aliment, MD at 10/12/2022 12:54 PM EDT

## 2022-10-12 NOTE — Discharge Summary (Signed)
Formatting of this note is different from the original.  Discharge Summary    Patient ID:  MRN: [id number]  Name: Roberto Rice  Age: 37 y.o.  Birthday:  04-Apr-1985  Admit Date: 10/12/2022  7:31 AM  Discharge Date: 10/12/22  Unit: [id number]  Hospitalist: Earlie Server, DO    Discharge Diagnosis:  Principal Problem:    Problem with dialysis access (CMS/HCC)  Active Problems:    Acute kidney injury superimposed on CKD (CMS/HCC)    Renal transplant, status post    Anemia    Clostridium difficile colitis    Consults:    IP CONSULT TO NEPHROLOGY  IP CONSULT TO VASCULAR SURGERY    Subjective (10/12/22):  See H&P    Hospital Course: (10/12/2022-10/12/22)   Patient admitted with a problem with his dialysis access. Oozing blood. Stitch was placed which sought this issue. Had HD today and tolerated well. Myfortic sent to pharmacy. Continue Dificid to complete C.diff treatment. F/u HD on Monday. Stable for DC.    ==============================================================  Physical Exam (10/12/22):  General: NAD  CVS: Regular rate  RESP: No tachypnea  ABD: Nondistended  SKIN: No jaundice  EXT: No pitting edema  NEURO: Alert; No focal deficits    Diagnostic Studies:       XR Portable Chest    Result Date: 10/09/2022  EXAM: XR PORTABLE CHEST INDICATION: Chest pain, unspecified,  new HD line placed by IR 7/15 with associated swelling and pain TECHNIQUE: 2 views of the chest. COMPARISON: 09/30/2022 FINDINGS: Medical Devices: Left-sided tunneled dialysis catheter tip overlies the superior atrial caval junction. Heart and Mediastinum: Cardiomediastinal silhouette is within normal limits. Lungs and Pleura: Hypoexpanded lungs with mild bibasilar parenchymal opacities, likely atelectasis. No evidence for pneumothorax. Bones and Soft tissues: No acute abnormalities.    IMPRESSION: No acute cardiopulmonary abnormality. Report Verified by: Ezzie Dural, DO at 10/09/2022 12:31 AM EDT    CT Head WO Contrast    Result Date:  10/03/2022  EXAM: CT HEAD WO CONTRAST INDICATION: Headache, new or worsening, neuro deficit (Age 63-49y) TECHNIQUE: Axial thin section CT images of the head were obtained without contrast. Sagittal and coronal 2-D multiplanar reconstructions were performed at the scanner. COMPARISON: None available. FINDINGS: Adequate diagnostic quality. Brain parenchyma: Normal brain attenuation. Ventricles and extraaxial spaces: Normal ventricular system. 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 Orbits, paranasal sinuses, mastoids: No acute orbital abnormality. Clear paranasal sinuses. Clear mastoid air cells. Extracranial soft tissues: Normal. Calvarium and skull base: No fracture or suspicious osseous lesion.    IMPRESSION: 1.  No acute intracranial abnormality. 2.  No intracranial mass effect or hemorrhage. Report Verified by: Veto Kemps, MD at 10/03/2022 6:30 PM EDT    IR Bx Renal Right perc    Result Date: 10/03/2022  Procedure: Ultrasound-guided transplant kidney biopsy Performed on 10/01/2022 2:14 PM EDT Indications: 37 y.o. male with PMHx ESRD s/p kidney transplant in 2018, HTN who is admitted from OSH for AKI. Patient has a 5 day history of diarrhea and decreased urination. Patient also reported increased weight at presentation. Initially presented to [facility] and left AMA from [name] after being transferred there. Represented to [facility] with AMS and was found to have AKI with Cr 11.3, BUN 64. Started on iHD at OSH prior to transfer. Upon transfer to [facility], Cr 11.04 --> 11.81. Given persistent renal failure, IR is  consulted for transplant renal biopsy. Of note, patient had recent renal biopsy in May 2024 at OSH revealing acute rejection. Comparison: Transplant renal ultrasound 10/01/2022 Operator: Staff: Kelle Darting, MD Present for  the entire procedure. Resident/Fellow: None Contrast: None Complications: None Medications: Versed 2 mg iv, Fentanyl 100 mcg iv. Moderate sedation was monitored by radiology nursing staff. Procedure and Findings: The procedure was performed in the VIR suite following informed consent.  A time out was performed  and the correct site was confirmed by the radiologist.  Moderate sedation was administered under the attending physician's direction and continuously monitoring by a trained nursing specialist who was independent from those actually performing the procedure.  Total monitored sedation time was 13 minutes.  1% lidocaine local anesthesia was used.   With the patient in the Supine position, US of the targeted region was obtained to identify appropriate biopsy path. The overlying skin was prep and draped in usual sterile fashion. Local anesthesia was achieve following the subcutaneous injection of lidocaine. Under real-time U/S-guidance, a 17-gauge coaxial introducer needle was inserted.  Three core biopsy samples were removed with an 18-gauge CorVercet biopsy needle, and sent to pathology for analysis. Post-biopsy ultrasound demonstrated no hematoma.  A dressing was applied. The patient tolerated the procedure without immediate complication.    IMPRESSION: Technically-successful ultrasound-guided biopsy of transplant kidney. Plan: Follow-up pathology Report Verified by: Kelle Darting, MD at 10/03/2022 11:08 AM EDT    US KIDNEY TRANSPLANT    Result Date: 10/01/2022  EXAM: US RENAL TRANSPLANT INDICATION:  Acute Renal Failure COMPARISON:  None TECHNIQUE:  Grayscale imaging acquisition was performed of the transplanted kidney and urinary bladder with limited color and spectral (duplex) Doppler analysis of the renal vasculature. FINDINGS: 2 transplanted kidneys in the right lower quadrant. The more lateral right lower quadrant transplant kidney kidney measures 12 cm in size. There is no hydronephrosis. No fluid collections  are identified.  No masses or calculi are appreciated. Normal arterial and venous waveforms are demonstrated. Resistive indices within the renal parenchyma range from 0.61-0.75. The more medial right lower quadrant renal transplant kidney measures 12.3 cm in length. No hydronephrosis. No fluid collections. Normal arterial and venous waveforms are demonstrated. Resistive indices within the renal cortical R range from 0.6-0.74. Evaluation of the bladder is limited, but is grossly unremarkable.    IMPRESSION: Normal sonographic appearance of the right lower quadrant renal transplants. Report Verified by: Roseanne Kaufman, MD at 10/01/2022 10:39 AM EDT    XR Portable Chest    Result Date: 09/30/2022  EXAM: XR PORTABLE CHEST INDICATION: Dyspnea, unspecified TECHNIQUE: 1 view of the chest. COMPARISON: None. FINDINGS: Medical Devices: Left IJ Vas-Cath tip overlies the cavoatrial junction. Heart and Mediastinum: Cardiomediastinal silhouette is within normal limits. Lungs and Pleura: Lungs are clear. No evidence for pneumothorax. Bones and soft tissues: No acute abnormalities.    IMPRESSION: No acute cardiopulmonary abnormality. Report Verified by: Zane Herald, MD at 09/30/2022 12:59 AM EDT    XR Portable Chest STAT    Result Date: 09/29/2022                          [facility]                             [address]  [place], [place] [address]                                      Radiology  PATIENT NAME:  Roberto Rice, Roberto Rice                 MR#:  [id number] PROCEDURE DATE:  09/29/2022                       ACCOUNT#:  [id number] ACCESSION #:  [id number]                            ROOM#:  [id number] ORDERING PHYS:  Zada Girt  PROCEDURE: XR PORTABLE CHEST  TECHNIQUE: One-view  CLINICAL INDICATION:  line placement  COMPARISON: 09/29/2022.  FINDINGS: There is cardiomegaly.  There is a left IJ line with tip projecting over the distal SVC.  There is cardiomegaly with ground-glass congestion and  airspace opacities.  IMPRESSION: 1.  left IJ line has tip projecting over the distal SVC. 2.  Mild congestion and airspace opacities.               THIS IS AN ELECTRONICALLY VERIFIED REPORT               09/29/2022 10:03 AM:  Vic Blackbird, MD   Vic Blackbird, MD  pm DD:  09/29/2022 TD:  09/29/2022 JOB #:  [id number]                                     Radiology Page 1    of   1                                                          COPY    XR Portable Chest    Result Date: 09/29/2022  PROCEDURE INFORMATION: Exam: XR Chest Exam date and time: 09/29/2022 2:48 AM Age: 37 years old Clinical indication: Other: Dialysis cath placement; Additional info: Unresponsive TECHNIQUE: Imaging protocol: Radiologic exam of the chest. Views: 1 view. COMPARISON: CR (CHEST, CXR AP X WISE) 09/28/2022 12:17 AM FINDINGS: Tubes, catheters and devices: Left-sided central venous catheter, distal tip overlies the superior mediastinum. Lungs: Unremarkable. No consolidation. Pleural spaces: No pneumothorax noted. Heart/Mediastinum: Unremarkable. No cardiomegaly. Bones/joints: Unremarkable.     IMPRESSION: Central venous catheter located on the left, position is indeterminate . THIS DOCUMENT HAS BEEN ELECTRONICALLY SIGNED BY Bernarda Caffey, MD ON 09/29/2022 03:59 AM    CT HEAD R/O STROKE    Addendum Date: 09/29/2022    The findings were verbally communicated via telephone conference at 1:11 AM EDT on 09/29/2022 with Sylvan Cheese. The findings were acknowledged and understood. THIS DOCUMENT HAS BEEN ELECTRONICALLY SIGNED BY Leotis Shames, MD on 09/29/2022 01:11 AM    Result Date: 09/29/2022  PROCEDURE INFORMATION: Exam: CT Head Without Contrast Exam date and time: 09/29/2022 12:39 AM Age: 37 years old Clinical indication: Stroke-like symptoms; Altered mental status/memory loss; Additional info: AMS, unresponsive. No contrast given. Room 1/db TECHNIQUE: Imaging protocol: Computed tomography of the head without contrast. Total images: 224 Radiation optimization:  All CT scans at this  facility use at least one of these dose optimization techniques: automated exposure control; mA and/or kV adjustment per patient size (includes targeted exams where dose is matched to clinical indication); or iterative reconstruction. Other technique: STROKE PROTOCOL was implemented. COMPARISON: No relevant prior studies available. FINDINGS: Brain: Normal. No hemorrhage. Unremarkable white matter. No mass effect. The gray-white interface is maintained.  Mega cisterna magna is a normal variant. Cerebral ventricles: No ventriculomegaly. Paranasal sinuses: Visualized sinuses are unremarkable. No fluid levels. Mastoid air cells: Visualized mastoid air cells are well aerated. Bones: Unremarkable. No acute fracture. Soft tissues: Unremarkable.     IMPRESSION: No acute intracranial abnormality. ASSESSMENT: ASPECTS (Alberta Stroke Program Early CT Score) is 10. THIS DOCUMENT HAS BEEN ELECTRONICALLY SIGNED BY Leotis Shames, MD ON 09/29/2022 12:59 AM    US KIDNEY TRANSPLANT    Result Date: 09/28/2022  CLINICAL INDICATION: Concern for ejection TECHNIQUE: Multiplanar gray scale and Doppler vascular sonographic imaging of renal transplant. COMPARISON: CT scan dated 05/14/2022 FINDINGS: Location: Right lower quadrant renal transplants identified. 2 transplanted kidneys identified which abut each other in the right lower quadrant. The more lateral transplant will be identified as transplant 1, and the more d medial kidney will be identified as transplant2. There is normal parenchymal thickness and echogenicity. Length: Transplant one measures 12.2 cm, and transplant 2 measures 11.9 cm Hydronephrosis: No hydronephrosis. Mass: No mass lesions. Calculi: No discernible calculi. Fluid Collection: No peritransplant fluid collections. Intrarenal Arterial Resistive Indices: Transplant 1: The resistive indices for the arcuate arteries ranges from 0.78 to 0.88 which is within the normal range. The Doppler spectral trace is  also within normal limits. Transplant 2: The resistive indices for the arcuate arteries ranges from 0.58 to 0.88 which is within the normal range. The Doppler spectral trace is also within normal limits. Arterial and Venous Flow at Renal [place]: Doppler interrogation confirms patent main renal artery and vein. Urinary Bladder: Somewhat decompressed but grossly unremarkable.    1. Both right lower quadrant transplant kidneys are normal in appearance. 2. Doppler interrogation as above.Marland Kitchen CRITICAL RESULT: No. COMMUNICATION: Per this written report. Drafted by Donald Siva, MD on 09/28/2022 12:26 PM Final report signed by Donald Siva, MD on 09/28/2022 12:32 PM    XR Portable Chest    Result Date: 09/28/2022  PROCEDURE INFORMATION: Exam: XR Chest Exam date and time: 09/28/2022 12:17 AM Age: 37 years old Clinical indication: Other: Edema TECHNIQUE: Imaging protocol: Radiologic exam of the chest. Views: 1 view. COMPARISON: CR XR PORTABLE CHEST 06/11/2022 7:22 AM FINDINGS: Lungs:  Low lung volumes. No consolidation. Pleural spaces: Unremarkable. No pleural effusion. No pneumothorax. Heart/Mediastinum: Unremarkable.  Top-normal to mildly enlarged heart.. Vasculature: Unremarkable. Bones/joints: Unremarkable.     IMPRESSION: No acute findings. THIS DOCUMENT HAS BEEN ELECTRONICALLY SIGNED BY NED ENEA, MD ON 09/28/2022 12:47 AM    MRI Elbow WO Right    Result Date: 09/18/2022                          [facility]                             [address]                               [place], [place] [address]  Radiology  PATIENT NAME:  Roberto Rice, Roberto Rice                 MR#:  [id number] PROCEDURE DATE:  09/18/2022                       ACCOUNT#:  [id number] ACCESSION #:  [id number]                            ROOM#: ORDERING PHYS:  Dorothe Pea, MD   EXAM: Right Elbow MRI  CLINICAL INDICATION: Dx: Arm injury, right, initial encounter (S49.91XA (ICD-10-CM)); Rupture of right distal  biceps tendon, initial encounter  TECHNIQUE: Axial T1, axial T2 FS, sagittal T2 FS, coronal proton density, coronal T2 FS, coronal DESS.  COMPARISON: Radiograph performed 09/18/2022  FINDINGS: Bone and Bone Marrow: There is no fracture, AVN, or intraosseous lesion. The marrow signal is within normal limits. Cartilage: The cartilage is smoothly congruent throughout the joint space without focal deficits or osteochondral defect (OCD). Medial: The medial epicondyle is normal in signal with no evidence of flexor origin inflammation or avulsion. The ulnar collateral ligament is intact with no evidence of tear. Lateral: The lateral epicondyle is normal in signal with no evidence of extensor origin inflammation or avulsion. The lateral ulnar collateral and radial collateral ligaments are normal in signal with no evidence of tear. Anterior: The biceps and brachialis tendons are normal in signal and insert properly on the radial tubercle and ulna, respectively. There is no evidence of distal tear. Posterior: The triceps tendon inserts normally on the olecranon with no evidence of distal tear. The ulnar neurovascular bundle is normal in signal with no evidence of inflammatory change particularly in the region of the cubital tunnel. Soft Tissues: There is no evidence of muscle strain. There is no evidence of periarticular bursitis. There is no joint effusion. There is a prominent superficial vein within the flexor aspect of the forearm and antecubital fossa.  IMPRESSION: No evidence of biceps tendon tear.               THIS IS AN ELECTRONICALLY VERIFIED REPORT              09/18/2022 4:36 PM:  Steva Ready, MD   Steva Ready, MD  ar DD:  09/18/2022 TD:  09/18/2022 JOB #:  [id number]                                     Radiology Page 1    of   1                                                          COPY    XR Elbow Right 3 VW    Result Date: 09/18/2022                          [facility]                              [address]  [place], [place] [address]                                      Radiology  PATIENT NAME:  Roberto Rice, Roberto Rice                 MR#:  [id number] PROCEDURE DATE:  09/18/2022                       ACCOUNT#:  [id number] ACCESSION #:  [id number]                            ROOM#: ORDERING PHYS:  Dorothe Pea, MD  PROCEDURE: XR ELBOW RIGHT 3 VW  TECHNIQUE: Three-view  CLINICAL INDICATION:  injury  COMPARISON: None.  FINDINGS: Alignment is grossly anatomic.  No definitive displaced fracture.  There is a curvilinear metallic density seen in the volar soft tissue of the proximal 4 measuring 18 mm.  IMPRESSION: 1.  No visible fracture. 2.  There is a curvilinear metallic foreign body seen in the soft tissue of the proximal forearm.               THIS IS AN ELECTRONICALLY VERIFIED REPORT               09/18/2022 10:47 AM:  Vic Blackbird, MD   Vic Blackbird, MD  pm DD:  09/18/2022 TD:  09/18/2022 JOB #:  [id number]                                     Radiology Page 1    of   1                                                          COPY    XR Shoulder Right 2vw Or More    Result Date: 09/18/2022                          [facility]                             [address]                               [place], [place] [address]                                      Radiology  PATIENT NAME:  Roberto Rice, Roberto Rice                 MR#:  [id number] PROCEDURE DATE:  09/18/2022                       ACCOUNT#:  [id number] ACCESSION #:  [id number]  ROOM#: ORDERING PHYS:  Dorothe Pea, MD  PROCEDURE: XR SHOULDER RIGHT 2VW OR MORE  TECHNIQUE: Two-view  CLINICAL INDICATION:  injury  COMPARISON: None.  FINDINGS: Alignment is anatomic.  There is no evidence of fracture.  There is mild degenerative change at the [facility] joint.  IMPRESSION: Mild degenerative changes.               THIS IS AN ELECTRONICALLY VERIFIED REPORT               09/18/2022 10:45 AM:   Vic Blackbird, MD   Vic Blackbird, MD  pm DD:  09/18/2022 TD:  09/18/2022 JOB #:  [id number]                                     Radiology Page 1    of   1                                                          COPY      ==============================================================    Disposition:  Home    Discharge Condition:  Stable    Discharge Medications and Orders:    Current Discharge Medication List       CONTINUE these medications which have CHANGED    Details   mycophenolate sodium (MYFORTIC) 180 mg DR tablet Take 2 Tabs by mouth Twice a day for 90 days.  Qty: 360 Tablet, Refills: 0    Associated Diagnoses: Renal transplant recipient     fidaxomicin (DIFICID) 200 mg TABLET Take 1 Tablet by mouth Twice a day for 10 days. Indications: diarrhea from an infection with Clostridium difficile bacteria  Qty: 20 Tablet, Refills: 0    Associated Diagnoses: Acute on chronic renal failure (CMS/HCC)         CONTINUE these medications which have NOT CHANGED    Details   atovaquone (MEPRON) 750 mg/5 mL suspension Take 1,500 mg by mouth once daily with breakfast. SHAKE GENTLEY BEFORE USE. DO NOT FREEZE! MUST BE TAKEN WITH FOOD!     carvediloL (COREG) 12.5 mg tablet Take 12.5 mg by mouth Twice a day.     famotidine (PEPCID) 20 mg tablet Take 20 mg by mouth Once Daily.     simethicone (MYLICON) 80 mg chewable tablet Take 80 mg by mouth Four times a day as needed for Flatulence.     !! tacrolimus (PROGRAF) 1 mg Take 6 mg by mouth At bedtime.     !! tacrolimus (PROGRAF) 1 mg Take 5 mg by mouth Every morning.     cholecalciferol, vitamin D3, (VITAMIN D3) Tab tablet Take 2,000 Units by mouth Every morning.     NIFEdipine 60 mg ER tablet Take 60 mg by mouth Twice a day.     valGANciclovir (VALCYTE) 50 mg/mL SolR Take 2 mL by mouth Monday, Wednesday and Friday for 90 days.  Qty: 88 mL, Refills: 0    Associated Diagnoses: Acute renal transplant rejection      !! - Potential duplicate medications found. Please discuss with  provider.       STOP taking these medications      vancomycin (VANCOCIN) 125 mg Cap Comments:   Reason for Stopping:  PCP:  No primary care provider on file.  No primary physician on file.  None    Future Appointments   Date Time Provider Department Center   10/29/2022 10:00 AM Domenic Polite, APRN [facility] None   10/30/2022 11:10 AM Brion Aliment, MD [facility] None   11/01/2022  2:15 PM Leigh Aurora, APRN [facility]     ----  Time spent on discharge process: >30 minutes    Signed:  Earlie Server  10/12/2022    Electronically signed by Earlie Server, DO at 10/12/2022  4:59 PM EDT

## 2022-10-12 NOTE — Progress Notes (Signed)
Formatting of this note might be different from the original.  Refuses standing weight.  Electronically signed by Blair Heys, RN at 10/12/2022  9:50 AM EDT

## 2022-10-15 NOTE — Telephone Encounter (Signed)
Formatting of this note might be different from the original.  Rx sent for Mepron.   Electronically signed by Delfina Redwood, APRN at 10/15/2022  8:50 AM EDT

## 2022-10-15 NOTE — Telephone Encounter (Signed)
Formatting of this note might be different from the original.  Please contact [facility] renal transplant coordinator and confirm most recent medication list for this patient and ensure that Epic reflects the same.     Also, please ask them request records from [facility] and scan into the media for his most recent admission.     Thanks,   Delfina Redwood APRN-CNP    Medical release faxed to [facility] transplant, post transplant clinic at [phone]  [name], LPN    Electronically signed by [name], LPN at 09/81/1914  1:11 PM EDT

## 2022-10-15 NOTE — Progress Notes (Signed)
Formatting of this note is different from the original.                                                Hemodialysis Monthly Visit and H and P     Roberto Rice  [id number]    Labs, meds, flow sheets, access related metrics  and orders reviewed in the [facility] EHR. The labs are available for review on the [facility] EHR system.      CC: Follow up on AKI-D    TREATMENT REVIEWED  Cramps: no  Adjusting Estimated Dry weight: no    DIALYSIS ASSESSMENT  Stable:  Yes  BP controlled:  Yes  Medications reviewed in the [facility] EMR. Patient was asked to bring his home meds to verify that he is on correct doses, he confirmed that he takes his valcyte, Atovaqoune and Prograf, MMF    ADEQUACY EVALUATED  KT/V at goal: yes    ACCESS EVALUATED  Access issues: no  Access type: TDC is being used    ANEMIA EVALUATED  Hemoglobin at goal: no  Receiving Erythrocyte stimulating agents: no  Receiving Iron IV:  yes  ESA  and Iron IV adjustments discussed with clinical manager and orders placed in the [facility] EMR. Benefits and risks of agents used to treat anemia discussed with the patient who agrees with the treatment plan    BONE AND MINERAL METABOLISM REVIEWED  Phosphorus at goal: no, will start phos binder sevelamer 800 mg TID  PTH at goal: No: start calcitriol    NUTRITIONAL STATUS EVALUATED  Albumin at goal: yes  Potassium at goal: yes    Medical:   HPI: I reviewed the dialysis course and flowsheets over the last month. The following issues were reviewed: dialysis treatments were mostly uneventful and the prescribed time was observed. Overall energy level is well, , the patient is mostly independent in all daily functions.    Past Medical History:   Diagnosis Date    Blood transfusion, without reported diagnosis     Hemodialysis     m-w-f  in [place]    Hyperlipidemia     Hypertension     Kidney disease approx 4 weeks    esrd dialysis m- w- f [place]     Vitals reviewed over the course of the dialysis treatment and BP trends  reviewed over the course of the month   Physical Exam    Gen: Patient is alert, oriented, NAD  Chest: CTAB  LE: no edema   Assessment/Plan:  Continue protocols as indicated. See comments in comprehensive assessment above.     Patient Active Problem List   Diagnosis    Acute kidney injury superimposed on CKD (CMS/HCC)    Rectal bleeding    Renal transplant, status post    Adjustment disorder with depressed mood    Problem with dialysis access (CMS/HCC)    Anemia    Clostridium difficile colitis       Brion Aliment, MD       Electronically signed by Brion Aliment, MD at 10/24/2022  4:10 PM EDT

## 2022-10-17 NOTE — Telephone Encounter (Signed)
Formatting of this note might be different from the original.  Please contact [facility] renal transplant coordinator and confirm most recent medication list for this patient and ensure that Epic reflects the same.     Also, please ask them request records from [facility] and scan into the media for his most recent admission.     Thanks,   Delfina Redwood APRN-CNP    Medical release faxed to [facility] transplant, post transplant clinic at [phone]  [name], LPN    Discharge medication list updated in the computer at this time. Requested documents received and sent to medical records to be scanned into media for review.  [name], LPN    Electronically signed by [name], LPN at 36/64/4034 10:41 AM EDT

## 2022-10-19 NOTE — Progress Notes (Signed)
Formatting of this note might be different from the original.  Hemodialysis Weekly Visit     HD UNIT: [facility]  Date of Service: 10/19/2022    Roberto Rice  [birth year]    Follow up on AKI-D    SUBJECTIVE:   Review of systems:    Edema: no  Recent issues/hospitalizations: Recent hospitalization at [facility] and [name] reviewed. Patient endorses nausea. Otherwise no complaints. He is attempting to get a hardship license. Requesting myself or Dr Astrid Drafts sign paperwork once it becomes available.     OBJECTIVE:  Physical Examination:   Vitals reviewed.   General: Alert. Sitting in chair receiving dialysis.   LUNGS: Clear to auscultation bilaterally anteriorly.   LE: No edema in the lower extremities.    ASSESSMENT AND PLANS:  AKI-D Continue patient on current HD prescription without change.  New orders from today's visit: None.     Electronically signed:   Delfina Redwood APRN-CNP   [facility]-Nephrology & Hypertension  Electronically signed by Delfina Redwood, APRN at 10/19/2022 11:12 AM EDT

## 2022-10-23 NOTE — Telephone Encounter (Signed)
Formatting of this note might be different from the original.  Attempted to call the patient to follow up on if he has a supply of medications post-discharge.  No answer left voicemail with call back instructions.  Called [facility] to verify if prescriptions had been picked up. Spoke to [place] the patient has picked up mycophenolate, atovaquone, and valganciclovir since discharge.   Electronically signed by Pleas Patricia, PHARMD at 10/23/2022  9:18 AM EDT

## 2022-10-29 NOTE — Progress Notes (Signed)
Formatting of this note is different from the original.  [facility] New Patient    Assessment & Plan:    Sanjeet was seen today for hospital follow-up and establish care.    Diagnoses and all orders for this visit:    1. Encounter to establish care  Will establish to practice today. Reviewed past medical records.    2. Acute kidney injury superimposed on CKD (CMS/HCC)  Will refer to different nephrologist for second opinion. Needs to keep appointments with nephrology, [place] and dialysis for now. Check labs.    - Ambulatory referral to Nephrology  - Comprehensive Metabolic Panel; Future    3. Renal transplant, status post  Will check labs today.    - Ambulatory referral to Nephrology  - CBC w/Differential; Future  - Magnesium; Future  - Phosphorus; Future  - Tacrolimus Level; Future    4. Clostridium difficile colitis  Refer to GI and infectious disease.    - Ambulatory referral to Infectious Disease  - Ambulatory referral to Gastroenterology    5. Adjustment disorder with depressed mood  Continue current medications.    6. Screening for lipid disorders  Check lipid panel.    - Lipid Panel; Future     Body mass index is 28.03 kg/m. Follow-up regarding the patient's high BMI included dietary management counseling, education, and guidance provided, regular exercise and healthy lifestyle recommended, and patient/family counseling regarding appropriate caloric intake.    F/U routinely and PRN    Other Note information:    Chief Complaint   Patient presents with   ? Hospital Follow-up     Problem with dialysis access, C diff. Pt needs get rid of the infection to  save his kidney transplant.    ? Establish Care     37 y/o male presents to clinic today to establish care and for hospital follow up visit. [facility] PCP and providers [place] been [place] state. 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 He was admitted to ICU and 4 hours HD was performed which he  tolerated well. He was hypertensive before dialysis and hydralazine IV was given without improvement. Post dialysis he remained hypertensive with systolic 178. Nitro gtt was started. This improved. He was accepted for transfer at [facility] given we do not have transplant medicine available here. Had transplant at [facility]. Now seeing transplant team at [facility]. He has dialysis on Monday, Wednesday and Fridays. Sees [facility]. Still having a lot of diarrhea. Positive for Cdiff recently and treated with vancomycin and dificid. Blood pressure slightly elevated today. Denies any chest pain or SOB. Does request new nephrologist evaluation if possible.     These items have been reviewed today prior, during or after the actual office visit:  Last office note, Recent ER visit, Records from specialist, Multiple records from specialists, Most recent lab work, Most recent imaging results, Records from Outside Facilities and/or Providers, Previous Medical Records, and Recent hospitalization records    Past Medical History:   Diagnosis Date   ? Blood transfusion, without reported diagnosis    ? Hemodialysis     m-w-f  in [place]   ? Hyperlipidemia    ? Hypertension    ? Kidney disease approx 4 weeks    esrd dialysis m- w- f [place]     Past Surgical History:   Procedure Laterality Date   ? AV FISTULOGRAM N/A 10/04/2011    AV FISTULOGRAM performed by Audelia Acton, MD at [facility]  VASCULAR LABS   ? EMBOLIZATION Right 10/04/2011    EMBOLIZATION performed by Audelia Acton, MD at [facility] VASCULAR LABS   ? HX ARTERIO-VENOUS FISTULA Right 08/16/2011    ARTERIO-VENOUS FISTULA performed by Audelia Acton, MD at [facility] CVOR   ? HX BROKEN/FX BONES      lt ankle ten pins and a plate   ? HX COLONOSCOPY N/A 07/01/2022    COLONOSCOPY /C ARGON PLASMA COAGULATION performed by Princess Bruins, MD at [facility] MAIN OR   ? HX OTHER SURGICAL HISTORY      tunnel cath [facility]/2011 [facility]   ? HX ULTRASOUND, INTRAOPERATIVE Right 08/16/2011    ULTRASOUND,  INTRAOPERATIVE performed by Audelia Acton, MD at [facility] CVOR   ? TUNNEL CATH PLACEMENT N/A 08/02/2011    TUNNEL CATH PLACEMENT performed by Audelia Acton, MD at [facility] VASCULAR LABS   ? VENOUS PTA Right 10/04/2011    VENOUS PTA performed by Audelia Acton, MD at [facility] VASCULAR LABS     Social History     Tobacco Use   ? Smoking status: Never   Vaping Use   ? Vaping Use: Never used   Substance Use Topics   ? Alcohol use: No   ? Drug use: Yes     Types: Marijuana     Allergies   Allergen Reactions   ? Nsaids (Non-Steroidal Anti-Inflammatory Drug) Other (See Comments)     CANNOT TAKE DUE TO KIDNEY FAILURE   CANNOT TAKE DUE TO KIDNEY FAILURE       Outpatient Encounter Medications as of 10/29/2022   Medication Sig Dispense Refill   ? vancomycin (VANCOCIN) 125 mg Cap Take 125 mg by mouth Every 6 hours. For 10 days- started 10/10/22     ? atovaquone (MEPRON) 750 mg/5 mL suspension Take 10 mL by mouth once daily with breakfast. for 30 days. SHAKE GENTLEY BEFORE USE. DO NOT FREEZE! MUST BE TAKEN WITH FOOD! 210 mL 1   ? carvediloL (COREG) 12.5 mg tablet Take 12.5 mg by mouth Twice a day.     ? famotidine (PEPCID) 20 mg tablet Take 20 mg by mouth Once Daily.     ? simethicone (MYLICON) 80 mg chewable tablet Take 80 mg by mouth Four times a day as needed for Flatulence.     ? tacrolimus (PROGRAF) 1 mg Take 6 mg by mouth At bedtime.     ? valGANciclovir (VALCYTE) 50 mg/mL SolR Take 2 mL by mouth Monday, Wednesday and Friday for 90 days. 88 mL 0   ? mycophenolate sodium (MYFORTIC) 180 mg DR tablet Take 2 Tabs by mouth Twice a day for 90 days. 360 Tablet 0   ? tacrolimus (PROGRAF) 1 mg Take 5 mg by mouth Every morning.     ? cholecalciferol, vitamin D3, (VITAMIN D3) Tab tablet Take 2,000 Units by mouth Every morning.     ? NIFEdipine 60 mg ER tablet Take 60 mg by mouth Twice a day.     ? [EXPIRED] fidaxomicin (DIFICID) 200 mg TABLET Take 1 Tablet by mouth Twice a day for 10 days. Indications: diarrhea from an infection with Clostridium  difficile bacteria (Patient taking differently: Take 200 mg by mouth Twice a day. For 8 days   Indications: diarrhea from an infection with Clostridium difficile bacteria) 20 Tablet 0     Facility-Administered Encounter Medications as of 10/29/2022   Medication Dose Route Frequency Provider Last Rate Last Admin   ? [COMPLETED] heparin (porcine) injection 2,500 Units  2,500 Units Intercatheter  ONE TIME Brion Aliment, MD   2,500 Units at 10/12/22 1300     Review of Systems   Constitutional:  Negative for chills, fatigue and fever.   HENT:  Negative for congestion.    Respiratory:  Negative for cough, shortness of breath and wheezing.    Cardiovascular:  Negative for chest pain, palpitations and leg swelling.   Gastrointestinal:  Negative for diarrhea, nausea and vomiting.   Genitourinary:  Negative for difficulty urinating.   Musculoskeletal:  Negative for arthralgias and myalgias.   Skin:  Negative for color change.   Neurological:  Negative for dizziness, light-headedness and headaches.   Psychiatric/Behavioral:  Positive for agitation, behavioral problems and dysphoric mood. Negative for self-injury and suicidal ideas.      Vitals:    10/29/22 1020 10/29/22 1151   BP: (!) 142/100 (!) 138/94   Pulse: 80    Resp: 16    Temp: 98.8 F (37.1 C)    SpO2: 98%    Weight: 91.2 kg (201 lb)    Height: 5' 11 (180.3 cm)      Physical Exam  Vitals reviewed.   Constitutional:       Appearance: Normal appearance.   HENT:      Head: Normocephalic and atraumatic.   Cardiovascular:      Rate and Rhythm: Normal rate and regular rhythm.   Pulmonary:      Effort: Pulmonary effort is normal.      Breath sounds: Normal breath sounds.   Abdominal:      General: Bowel sounds are normal.      Palpations: Abdomen is soft.   Skin:     General: Skin is warm and dry.   Neurological:      General: No focal deficit present.      Mental Status: He is alert and oriented to person, place, and time.   Psychiatric:         Mood and Affect: Mood  normal.     Domenic Polite, APRN  Electronically signed by Domenic Polite, APRN at 11/05/2022 12:38 PM EDT

## 2022-10-29 NOTE — Telephone Encounter (Signed)
Formatting of this note might be different from the original.  Roberto Rice    Test Name: Creatinine  Results:  15.1  10/29/2022  Time: 11:48  Received from: Lab  R/V by: Alphonsa Overall LPN    Physician: Domenic Polite APRN  10/29/2022  Time: 11:48  Notified: yes      Electronically signed by Alphonsa Overall, LPN at 30/86/5784 11:49 AM EDT

## 2022-10-29 NOTE — Progress Notes (Signed)
Formatting of this note might be different from the original.  Venipuncture Progress Notes  RAC:                              LAC: 1x  (R) Hand:                       (L) Hand:  Site Checked: yes  Patient on anticoagulation therapy: no  Tubes drawn: 1 green, 2 lavender, and 1 sst (gold)      Electronically signed by Alphonsa Overall, LPN at 78/46/9629 12:38 PM EDT

## 2022-10-29 NOTE — Telephone Encounter (Signed)
Formatting of this note might be different from the original.  Noted. See lab note  Electronically signed by Domenic Polite, APRN at 10/29/2022 12:27 PM EDT

## 2022-10-30 NOTE — Progress Notes (Signed)
Formatting of this note might be different from the original.  Error encounter  This encounter was created in error - please disregard.  Electronically signed by Brion Aliment, MD at 11/01/2022  1:33 PM EDT

## 2022-10-31 NOTE — Progress Notes (Addendum)
Brief Nephrology Note:    Discussed patient case with Dr. Astrid Drafts [facility]) today in followup to recent hospital admission 09/2022.     If no signs of renal recovery, I advised that would recommend continuing renal replacement therapy/hemodialysis, 3 times a week, session duration set to achieve goal kt/v per standard of care  Would not expect HD to interfere with kidney transplant renal recovery  Recent medical history reviewed, including ACR from 09/2022. 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  I advised Dr. Astrid Drafts to touch base with patient's primary transplant center [facility] for guidance about immunosuppression weaning and any additional recommendations if there are no signs of renal recovery.     Barbee Cough, MD  Nephrology  [facility]

## 2022-11-01 NOTE — Telephone Encounter (Signed)
Formatting of this note might be different from the original.  Is this something that you recommend him to not work?     Electronically signed by Norva Karvonen at 11/01/2022 11:13 AM EDT

## 2022-11-01 NOTE — Telephone Encounter (Signed)
Formatting of this note might be different from the original.  Pt wanted me to let you know that he's having a darker type stool.     He will pick up the letter - it is in the pick up box under the s  Electronically signed by Norva Karvonen at 11/01/2022  2:56 PM EDT

## 2022-11-01 NOTE — Telephone Encounter (Signed)
Formatting of this note might be different from the original.  Patient calling in requesting that we write him up a letter for not being able to work.    Please advise and thanks.  Electronically signed by Cleopatra Cedar at 11/01/2022 10:49 AM EDT

## 2022-11-01 NOTE — Progress Notes (Signed)
Formatting of this note is different from the original.  Subjective   Subjective:     Patient ID: Roberto Rice is an 37 y.o. male    Chief Complaint:   No chief complaint on file.    HPI  Elbow Injury  Injury occurred on 09/17/22 after wrestling with his kids.  He reports his pain as 0/10 today.  He has been icing, resting, wearing sling with ace wraps and taking tylenol prn.      Past Medical History:   Diagnosis Date    Blood transfusion, without reported diagnosis     Hemodialysis     m-w-f  in [place]    Hyperlipidemia     Hypertension     Kidney disease approx 4 weeks    esrd dialysis m- w- f [place]     Current Outpatient Medications   Medication Sig Dispense Refill    vancomycin (VANCOCIN) 125 mg Cap Take 125 mg by mouth Every 6 hours. For 10 days- started 10/10/22      atovaquone (MEPRON) 750 mg/5 mL suspension Take 10 mL by mouth once daily with breakfast. for 30 days. SHAKE GENTLEY BEFORE USE. DO NOT FREEZE! MUST BE TAKEN WITH FOOD! 210 mL 1    carvediloL (COREG) 12.5 mg tablet Take 12.5 mg by mouth Twice a day.      famotidine (PEPCID) 20 mg tablet Take 20 mg by mouth Once Daily.      simethicone (MYLICON) 80 mg chewable tablet Take 80 mg by mouth Four times a day as needed for Flatulence.      tacrolimus (PROGRAF) 1 mg Take 6 mg by mouth At bedtime.      valGANciclovir (VALCYTE) 50 mg/mL SolR Take 2 mL by mouth Monday, Wednesday and Friday for 90 days. 88 mL 0    mycophenolate sodium (MYFORTIC) 180 mg DR tablet Take 2 Tabs by mouth Twice a day for 90 days. 360 Tablet 0    tacrolimus (PROGRAF) 1 mg Take 5 mg by mouth Every morning.      cholecalciferol, vitamin D3, (VITAMIN D3) Tab tablet Take 2,000 Units by mouth Every morning.      NIFEdipine 60 mg ER tablet Take 60 mg by mouth Twice a day.       No current facility-administered medications for this visit.     Allergies   Allergen Reactions    Nsaids (Non-Steroidal Anti-Inflammatory Drug) Other (See Comments)     CANNOT TAKE DUE TO KIDNEY  FAILURE   CANNOT TAKE DUE TO KIDNEY FAILURE       Review of Systems  Constitutional: Negative for activity change, appetite change, chills, fatigue and fever.   HENT: Negative for rhinorrhea, sore throat and trouble swallowing.    Eyes: Negative for photophobia and visual disturbance.   Respiratory: Negative for cough, chest tightness, shortness of breath and wheezing.    Cardiovascular: Negative for palpitations.   Gastrointestinal: Negative for abdominal distention, abdominal pain, nausea and vomiting.   Genitourinary: Negative for dysuria and hematuria.   Musculoskeletal: Negative for neck pain and neck stiffness.   Skin: Negative for color change and rash.   Neurological: Negative for dizziness, syncope, weakness, numbness and headaches.   Psychiatric/Behavioral: Negative for agitation, behavioral problems and sleep disturbance.       Social History     Socioeconomic History    Marital status: Single     Spouse name: Not on file    Number of children: Not on file  Years of education: Not on file    Highest education level: Not on file   Occupational History    Not on file   Tobacco Use    Smoking status: Never    Smokeless tobacco: Not on file   Vaping Use    Vaping Use: Never used   Substance and Sexual Activity    Alcohol use: No    Drug use: Yes     Types: Marijuana    Sexual activity: Not on file   Other Topics Concern    Not on file   Social History Narrative    Not on file     Social Determinants of Health     Financial Resource Strain: Not on file   Food Insecurity: Unknown (10/29/2022)    Hunger Vital Sign     Worried About Running Out of Food in the Last Year: Not on file     Ran Out of Food in the Last Year: Never true   Transportation Needs: Unknown (10/29/2022)    PRAPARE - Therapist, art (Medical): No     Lack of Transportation (Non-Medical): Not on file   Physical Activity: Not on file   Stress: Not on file   Social Connections: Not on file   Intimate Partner Violence:  Unknown (10/29/2022)    Humiliation, Afraid, Rape, and Kick questionnaire     Fear of Current or Ex-Partner: No     Emotionally Abused: Not on file     Physically Abused: Not on file     Sexually Abused: Not on file   Housing Stability: Unknown (10/29/2022)    Housing Stability Vital Sign     Unable to Pay for Housing in the Last Year: Not on file     Number of Places Lived in the Last Year: Not on file     Unstable Housing in the Last Year: No     Past Surgical History:   Procedure Laterality Date    AV FISTULOGRAM N/A 10/04/2011    AV FISTULOGRAM performed by Audelia Acton, MD at [facility] VASCULAR LABS    EMBOLIZATION Right 10/04/2011    EMBOLIZATION performed by Audelia Acton, MD at [facility] VASCULAR LABS    HX ARTERIO-VENOUS FISTULA Right 08/16/2011    ARTERIO-VENOUS FISTULA performed by Audelia Acton, MD at [facility] CVOR    HX BROKEN/FX BONES      lt ankle ten pins and a plate    HX COLONOSCOPY N/A 07/01/2022    COLONOSCOPY /C ARGON PLASMA COAGULATION performed by Princess Bruins, MD at [facility] MAIN OR    HX OTHER SURGICAL HISTORY      tunnel cath 06/2011 [facility]    HX ULTRASOUND, INTRAOPERATIVE Right 08/16/2011    ULTRASOUND, INTRAOPERATIVE performed by Audelia Acton, MD at [facility] CVOR    TUNNEL CATH PLACEMENT N/A 08/02/2011    TUNNEL CATH PLACEMENT performed by Audelia Acton, MD at [facility] VASCULAR LABS    VENOUS PTA Right 10/04/2011    VENOUS PTA performed by Audelia Acton, MD at [facility] VASCULAR LABS     Family History   Problem Relation Name Age of Onset    Hypertension Mother      Hypertension Father           Objective   Objective:   There were no vitals taken for this visit.    Physical Exam  Nursing note reviewed. Exam conducted with a chaperone present.   Constitutional:  Appearance: Normal appearance.   Pulmonary:      Effort: Pulmonary effort is normal.   Musculoskeletal:         General: Deformity (popeye) and signs of injury (09/17/22) present. No swelling or tenderness.   Skin:     General: Skin is warm and dry.      Findings:  No bruising.   Neurological:      Mental Status: He is alert and oriented to person, place, and time.      Sensory: No sensory deficit.      Motor: No weakness.   Psychiatric:         Behavior: Behavior normal.         Thought Content: Thought content normal.     Right Elbow Exam     Tenderness   The patient is experiencing no tenderness.     Range of Motion   Extension:  normal   Flexion:  normal   Pronation:  normal   Supination:  normal     Muscle Strength   Pronation:  5/5/5   Supination:  5/5/5     Tests   Varus: negative  Valgus: negative  Tinel's sign (cubital tunnel): negative    Other   Erythema: absent  Scars: absent  Sensation: normal  Pulse: present    Comments:  Positive Hook Test    Left Elbow Exam   Left elbow exam is normal.    Tenderness   The patient is experiencing no tenderness.     Range of Motion   Extension:  normal   Flexion:  normal   Pronation:  normal   Supination:  normal     Muscle Strength   Pronation:  5/5/5   Supination:  5/5/5     Tests   Varus: negative  Valgus: negative  Tinel's sign (cubital tunnel): negative    Other   Erythema: absent  Sensation: normal              Assessment/ Plan:     1. Injury of tendon of biceps         Est pt here for f/u and reports he is doing better.   He has not been able to attend PT due other health problems and he has been hospitalized in [name].   He has full ROM and 5/5 strength.   He will f/u as needed.     Thank you for allowing me to participate in this patient's care.     Leigh Aurora, APRN  11/01/2022  10:24 AM      Electronically signed by Leigh Aurora, APRN at 11/01/2022 10:35 AM EDT

## 2022-11-02 NOTE — Telephone Encounter (Signed)
Formatting of this note might be different from the original.  Pt notified   Electronically signed by Norva Karvonen at 11/02/2022 12:28 PM EDT

## 2022-11-02 NOTE — Progress Notes (Signed)
Formatting of this note might be different from the original.  Hemodialysis Weekly Visit     HD UNIT: [facility]  Date of Service: 11/02/2022    Ibrahem Wiecek  [birth year]    Follow up on AKI-D    SUBJECTIVE:   Review of systems:    Edema: no  Recent issues/hospitalizations: high BP    OBJECTIVE:  Physical Examination:   Vitals reviewed.   General: Alert. Sitting in chair receiving dialysis.   LUNGS: Clear to auscultation bilaterally anteriorly.   LE: No edema in the lower extremities.    ASSESSMENT AND PLANS:  AKI-D Continue patient on current HD prescription without change.  New orders from today's visit: Given 0.1mg  clonidine PO x 1 for elevated BP. Patient reports he did not take his anti-hypertensives before treatment. States he feels too early to take them at 0545am and takes them when he completes his treatment and gets back home.     Electronically signed:   Delfina Redwood APRN-CNP   [facility]-Nephrology & Hypertension  Electronically signed by Delfina Redwood, APRN at 11/05/2022  8:23 AM EDT

## 2022-11-05 NOTE — Telephone Encounter (Signed)
Formatting of this note might be different from the original.  Discharge summary from [facility] transplant scanned into media for review.  [name], LPN    Electronically signed by [name], LPN at 95/63/8756  1:25 PM EDT

## 2022-11-06 NOTE — Telephone Encounter (Signed)
Formatting of this note might be different from the original.  Tried to call pt. Left voice mail for call back     Electronically signed by Greggory Brandy, MA at 11/06/2022  3:26 PM EDT

## 2022-11-06 NOTE — Telephone Encounter (Signed)
Formatting of this note might be different from the original.  Patient is requesting some testing and order to be placed to be tested for sexually transmitted disease please advise and let the patient know [phone] has an apt 11-08-22   Electronically signed by Zola Button at 11/06/2022  2:27 PM EDT

## 2022-11-07 NOTE — Telephone Encounter (Signed)
Formatting of this note might be different from the original.  Pt advised and voiced understanding    Electronically signed by Greggory Brandy, MA at 11/07/2022 10:48 AM EDT

## 2022-11-08 NOTE — Telephone Encounter (Signed)
Formatting of this note might be different from the original.  Keigen Plaut    Test Name: Creatinine  Results:  10.4  11/08/2022  Time: 12:09  Received from: lab  R/V by: Alphonsa Overall LPN    Physician: Domenic Polite APRN  11/08/2022  Time: 12:09  Notified: yes   Electronically signed by Alphonsa Overall, LPN at 03/47/4259 12:12 PM EDT

## 2022-11-08 NOTE — Telephone Encounter (Signed)
Formatting of this note might be different from the original.  Noted. Improved from prior draw. No changes. Cotninue dialysis  Electronically signed by Domenic Polite, APRN at 11/08/2022 12:33 PM EDT

## 2022-11-09 ENCOUNTER — Ambulatory Visit: Payer: PRIVATE HEALTH INSURANCE | Attending: Student in an Organized Health Care Education/Training Program

## 2022-11-09 NOTE — Progress Notes (Deleted)
[facility]  [facility] INTERNAL MEDICINE AT [facility]  [address]  [place] [address]  [phone]    Name:  Roberto Rice   MRN: [id number]  Date of Birth: 04-13-1985  Age: 37 y.o.       Chief complaint:  No chief complaint on file.   ***     HPI:  HPI    ***  #ESRD   #s/p renal transplant  -recent rejection got methylprednisolone and thymoglobulin  Needs 3 mos atovaquone and valcyte  -remains on tacro, cellcept,   # ESRD  Secondary to ***  - Access:   - Center:   - Nephrologist:  - HD schedule:  - Last HD on:  - Dry weight:  - Nephrology following      #HFrEF  ***      #HTN  Carvedilol, nifedipine    Medical:  ***  Surgical:  ***  Obstetric/Gynecologic: No obstetric history on file. , *** ,procedures: {Procedures; hysterectomy:31767}***,pap: {findings; last pap:13140}    Family:  ***   Geography:  ***  Occupational:  ***  Diet: ***  Sexual: ***    Tobacco: ***  Alcohol: ***  Other drug use: ***    Chart Review:  11/01/2022 saw PCP bicep tendon pain  8/7 nephrology advised HD 3 times weekly if no renal recovery,***   7/18 discharged after admission for diarrhea,  AKI/renal txp rejection, required HD, got thymoglobulin, and methylprednisolone, started on atovaquone and valcyte ppx for 3 mo course (end 10/9)    Condition(s) also noted: {TOAD:29961}         Health Maintenance Due   Topic Date Due    Hepatitis C Screening (MyChart)  Never done    Immunization: COVID-19 (1) Never done    HIV Screening  Never done    Depression Screening  Never done    Immunization: DTaP/Tdap/Td (1 - Tdap) Never done     Past Medical History:   Diagnosis Date    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 difficile diarrhea     Hypertension     Kidney transplant recipient      Past Surgical History:   Procedure Laterality Date    NEPHRECTOMY TRANSPLANTED ORGAN       No family history on file.  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 210 mL 0    carvediloL (COREG) 12.5 MG tablet Take 1 tablet (12.5 mg total) by mouth 2 times a day. 60 tablet 0    cholecalciferol, vitamin D3, 1000 units tablet Take 2 tablets (2,000 Units total) by mouth daily.      famotidine (PEPCID) 20 MG tablet Take 1 tablet (20 mg total) by mouth daily. 60 tablet 0    mycophenolate (MYFORTIC) 360 MG TbEC Take 1 tablet (360 mg total) by mouth 2 times a  day. 30 tablet 0    NIFEdipine (PROCARDIA-XL) 60 MG (OSM) 24 hr tablet Take 1 tablet (60 mg total) by mouth 2 times a day.      simethicone (MYLICON) 80 MG chewable tablet Chew 1 tablet (80 mg total) by mouth 4 times a day as needed for Flatulence. 30 tablet 0    tacrolimus (PROGRAF) 1 MG capsule Take 6 capsules (6 mg total) by mouth at bedtime. 30 capsule 0    tacrolimus (PROGRAF) 5 MG capsule Take 1 capsule (5 mg total) by mouth daily. Take daily in the morning. 30 capsule 0    valGANciclovir (VALCYTE) 50 mg/mL SolR Take 2 mLs (100 mg total) by mouth every Monday, Wednesday, and Friday. 30 mL 0     No current facility-administered medications for this visit.        Allergies:  No Known Drug Allergies or Adverse Reactions    There were no vitals filed for this visit.  There is no height or weight on file to calculate BMI.    Physical Exam ***    Assessment and Plan:  Problem List Items Addressed This Visit    None      No follow-ups on file.    No LOS data to display      Electronically signed by: Cherlynn Kaiser, MD 11/08/2022 10:49 PM

## 2022-12-07 NOTE — Progress Notes (Signed)
Formatting of this note is different from the original.                                                Hemodialysis Monthly Visit and H and P     Roberto Rice  [id number]    Labs, meds, flow sheets, access related metrics  and orders reviewed in the [facility] EHR. The labs are available for review on the [facility] EHR system.      CC: Follow up on AKI    TREATMENT REVIEWED  Cramps: no  Adjusting Estimated Dry weight: no,     DIALYSIS ASSESSMENT  Stable:  Yes  BP controlled:  No: Anti HTN adjusted  He is being weaned off his immune suppression and managed for that by his transplant nephjrologist in [facility]  Medications reviewed in the [facility] EMR. No changes made    ADEQUACY EVALUATED  KT/V at goal: yes    ACCESS EVALUATED  Access issues: no  Access type: TDC is now being used and we discussed the importance of getting an AVF/AVG candidacy evaluation, He followed by Vascular in [facility] and requested to see out vascular surgeon again     ANEMIA EVALUATED  Hemoglobin at goal: yes  Receiving Erythrocyte stimulating agents: no  Receiving Iron IV:  yes  ESA  and Iron IV adjustments discussed with clinical manager and orders placed in the [facility] EMR. Benefits and risks of agents used to treat anemia discussed with the patient who agrees with the treatment plan    BONE AND MINERAL METABOLISM REVIEWED  Phosphorus at goal: no, He ran out of Aurexia samples and I sent a script for his pharmcy  PTH at goal: No: active Vit D is being adjusted per protocoil     NUTRITIONAL STATUS EVALUATED  Albumin at goal: yes  Potassium at goal: yes    Medical:   HPI: I reviewed the dialysis course and flowsheets over the last month. The following issues were reviewed: dialysis treatments were mostly uneventful and the prescribed time was observed. Overall energy level is well, , the patient is mostly independent in all daily functions  Patient with no signs of renal recovery.     Past Medical History:   Diagnosis Date    Blood  transfusion, without reported diagnosis     Epigastric pain     Fistula, arteriovenous, acquired (CMS/HCC)     has old fistula right arm, may be used for secondary fistula creation. is to see vascular    Hemodialysis     m-w-f  in [place]    Hepatitis C antibody positive in blood 11/08/2022    Hyperlipidemia     Hypertension     Kidney disease approx 4 weeks    esrd dialysis m- w- f [place]    Kidney transplant rejection     on hemodialysis    Limb alert care status     Right arm (has old fistula not completely thrombosed. May be used for secondary fistula creation)    Nausea and vomiting, unspecified vomiting type     S/P dialysis catheter insertion (CMS/HCC)     chest     Vitals reviewed over the course of the dialysis treatment and BP trends reviewed over the course of the month   Physical Exam    Chest: CTAB  LE: no edema  Assessment/Plan:  Continue protocols as indicated. See comments in comprehensive assessment above.     Patient Active Problem List   Diagnosis    Acute kidney injury superimposed on CKD (CMS/HCC)    Rectal bleeding    Renal transplant, status post    Adjustment disorder with depressed mood    Problem with dialysis access (CMS/HCC)    Anemia    Clostridium difficile colitis    Nausea and vomiting    Epigastric pain    Hepatitis C antibody positive in blood       Brion Aliment, MD       Electronically signed by Brion Aliment, MD at 01/04/2023 12:26 PM EDT

## 2023-01-18 NOTE — Progress Notes (Signed)
Formatting of this note is different from the original.                                                Hemodialysis Monthly Visit and H and P                                                                       [facility]    Labs, meds, flow sheets, access related metrics and orders reviewed in the [facility] EHR. The labs are available for review on the [facility] EHR system.      CC: Follow up on ESRD    HPI/ Interval history:  I reviewed the dialysis course and flowsheets over the last month. The following issues were reviewed: This patient was under Dr. Johney Maine care, until he moved to [facility] and now he is back to [place].  He has a history of kidney transplant which failed 7 years later and he continues to follow his transplant care in [place].  He tells me that he is going to be moving more permanently to this area.  He has been on immunosuppressive medications that have been gradually de-escalated over the last year.  We will continue to decrease the dose of his mycophenolate and tacrolimus.  He continues to be on low-dose prednisone.  As part of his transition, he was admitted to the hospital last week and the nephrology team had seen him and it was deemed appropriate to reassess his attending physician at the dialysis unit, and I will be seeing him for the next few weeks, with him receiving dialysis treatments currently under transient status since the [facility] records have him as a permanent status in the dialysis unit in [place].  I had a long conversation with him today about the importance of trust and openness between the patient and their physician, with the goal being to improve the health outcome of the patient, and the physician and the doctor acting as a team.  We also discussed the unique environment at the dialysis clinic and the fact that there are other patients receiving dialysis at the same time as him, and the fact that the dialysis nurses are meeting the  needs of all the patients at the dialysis clinic.  We discussed the importance of a friendly environment and the importance of making sure that everyone maintains the highest level of professionalism and interaction between patients and dialysis unit staff.  I answered all his questions, and I advised him that I will be rounding on a weekly basis to continue to assess his medical needs as well as continue to support his transition if he ends up moving to [place] permanently.  He would like to be referred also to the [facility] transplant center for evaluation of repeat kidney transplant.  I did explain to him the importance of compliance to dialysis and medications, as he prepares for another evaluation for repeat kidney transplant.  Overall energy level is fair  .     Assessment/Plan:  Continue dialysis clinic protocols as indicated. Comprehensive assessment below with plan pertaining to  kidney disease and dialysis aspects of care. Medication adjustments and orders placed in the [facility] EHR.    DIALYSIS ASSESSMENT  Stable:  yes  BP well controlled:  yes    Medications reviewed in the [facility] EMR. No changes made    VOLUME STATUS EVALUATION  Cramps or excessive interdialytic weight gains: no  Adjusting Estimated Dry weight: no    ADEQUACY EVALUATION  KT/V at goal: yes    ACCESS EVALUATION  Access issues: no  Access type: TDC is now being used    ANEMIA EVALUATION  Hemoglobin at goal: yes  Receiving Erythrocyte stimulating agents: yes  Receiving Iron IV:  yes  ESA and Iron IV adjustments discussed with clinical manager and orders placed in the [facility] EMR. Benefits and risks of agents used to treat anemia discussed with the patient who agrees with the treatment plan    BONE AND MINERAL METABOLISM EVALUATION  PTH at goal: yes  Calcium at goal: yes  Phosphorus at goal:  no, discussed the importance of adherence to phosphate binders    NUTRITIONAL STATUS EVALUATION  Albumin at goal: yes  Potassium at goal:  yes    Vitals reviewed over the course of the dialysis treatment and BP trends reviewed over the course of the month   Constitutional: negative for distress  Cardiovascular:  negative for edema  Pulmonary: negative for audible wheezing    Patient Active Problem List   Diagnosis   ? Renal transplant, status post   ? Adjustment disorder with depressed mood   ? Anemia   ? Hepatitis C antibody positive in blood   ? ESRD needing dialysis (CMS/HCC)   ? Hypertension   ? Hyperlipidemia       Verlene Mayer, M.D.     Voice transcription technology was used for dictation of this note and sound-alike words might be erroneously placed despite reviewing the note for accuracy.  Errors in dictation may reflect use of voice recognition software and not all errors in transcription may have been detected prior to signing.      Electronically signed by Verlene Mayer, MD at 01/20/2023  1:43 PM EDT

## 2023-01-23 NOTE — Progress Notes (Signed)
Formatting of this note might be different from the original.  Hemodialysis Basic Visit     Diagnosis: ESRD    Recent issues/hospitalizations: Today he is doing well.  He has successfully completed his dialysis treatments with the prescribed time and scheduled.  The dialysis staff had relayed to me that the patient wanted to discuss with the social worker concerns with mental health and mental stress.  Today I asked him and he said that his questions were answered successfully and he is satisfied with the support from the social worker.  The staff had also relayed to me that the patient prefers not to interact with a few nurses from the nursing team.  Today I asked him about his concerns and he says that he understands that different professionals have different personalities.  I did relay to him the importance of interacting with all of the nursing team at the dialysis clinic where there are also multiple other patients receiving care, and the importance of having professional and cordial conversations with the nursing team especially the clinical manager who is helping manage his care as well as the care of other patients in the unit and serves as a liaison between the physician in the unit and the patients.  The patient stated that he understands the content of our conversation and he was satisfied with the conversation that we had today.  He had his wife on FaceTime with him and it seems like he keeps his phone on with his was on FaceTime during the treatment.     Vital signs and treatment course reviewed. Discussed with the Unit staff.    On my exam: No edema in the lower extremities.    Changes made during this visit: no    Voice transcription technology was used for dictation of this note and sound-alike words might be erroneously placed despite reviewing the note for accuracy.  Errors in dictation may reflect use of voice recognition software and not all errors in transcription may have been detected prior  to signing.    Verlene Mayer, M.D., MD       Electronically signed by Verlene Mayer, MD at 01/23/2023 10:58 AM EDT

## 2023-01-29 NOTE — Telephone Encounter (Signed)
Formatting of this note might be different from the original.  ----- Message from Lowell Bouton, PA-C sent at 12/11/2022  4:39 PM EDT -----  Please schedule LUE AVG for Dr. Marvetta Gibbons. Thanks HK    Spoke with [place] Custard about scheduling his procedure.  He stated that he had a catheter placed and did not want to have graft placed at this time.  I told him to call me back when he was ready for placement.  He acknowledged the info.  Electronically signed by Jacquelyne Balint at 01/29/2023 12:21 PM EST

## 2023-02-04 NOTE — Progress Notes (Signed)
Formatting of this note is different from the original.                                                Hemodialysis Monthly Visit and H and P                                                                       [facility]    Labs, meds, flow sheets, access related metrics and orders reviewed in the [facility] EHR. The labs are available for review on the [facility] EHR system.      CC: Follow up on ESRD    HPI/ Interval history:  I reviewed the dialysis course and flowsheets over the last month. The following issues were reviewed: dialysis treatments were mostly uneventful and the prescribed time was observed. Overall energy level is fair  .     Assessment/Plan:  Continue dialysis clinic protocols as indicated. Comprehensive assessment below with plan pertaining to kidney disease and dialysis aspects of care. Medication adjustments and orders placed in the [facility] EHR.    DIALYSIS ASSESSMENT  Stable:  yes  BP well controlled:  yes    Medications reviewed in the [facility] EMR. No changes made    VOLUME STATUS EVALUATION  Cramps or excessive interdialytic weight gains: no  Adjusting Estimated Dry weight: no    ADEQUACY EVALUATION  KT/V at goal: yes    ACCESS EVALUATION  Access issues: no  Access type: TDC is now being used    ANEMIA EVALUATION  Hemoglobin at goal: yes  Receiving Erythrocyte stimulating agents: yes  Receiving Iron IV:  yes  ESA and Iron IV adjustments discussed with clinical manager and orders placed in the [facility] EMR. Benefits and risks of agents used to treat anemia discussed with the patient who agrees with the treatment plan    BONE AND MINERAL METABOLISM EVALUATION  PTH at goal: yes  Calcium at goal: yes  Phosphorus at goal:  no, discussed the importance of adherence to phosphate binders    NUTRITIONAL STATUS EVALUATION  Albumin at goal: yes  Potassium at goal: yes    Vitals reviewed over the course of the dialysis treatment and BP trends reviewed over the course of the month    Constitutional: negative for distress  Cardiovascular:  negative for edema  Pulmonary: negative for audible wheezing    Patient Active Problem List   Diagnosis    Renal transplant, status post    Adjustment disorder with depressed mood    Anemia    Hepatitis C antibody positive in blood    ESRD needing dialysis (CMS/HCC)    Hypertension    Hyperlipidemia       Roberto Rice, M.D.     Voice transcription technology was used for dictation of this note and sound-alike words might be erroneously placed despite reviewing the note for accuracy.  Errors in dictation may reflect use of voice recognition software and not all errors in transcription may have been detected prior to signing.      Electronically signed by Roberto Mayer, MD at 02/05/2023 12:04 PM EST

## 2023-02-08 NOTE — Progress Notes (Signed)
Formatting of this note might be different from the original.  Hemodialysis Basic Visit     Diagnosis: ESRD    Recent issues/hospitalizations: no    Vital signs and treatment course reviewed. Discussed with the Unit staff.    On my exam: No edema in the lower extremities.    Changes made during this visit: no    Voice transcription technology was used for dictation of this note and sound-alike words might be erroneously placed despite reviewing the note for accuracy.  Errors in dictation may reflect use of voice recognition software and not all errors in transcription may have been detected prior to signing.    Verlene Mayer, M.D., MD       Electronically signed by Verlene Mayer, MD at 02/11/2023  9:48 AM EST

## 2023-02-11 NOTE — Progress Notes (Signed)
 Formatting of this note might be different from the original.  Hemodialysis Basic Visit     Diagnosis: ESRD    Recent issues/hospitalizations: no    Vital signs and treatment course reviewed. Discussed with the Unit staff.    On my exam: No edema in the lo

## 2023-02-15 NOTE — Progress Notes (Signed)
Formatting of this note might be different from the original.  Hemodialysis Basic Visit     Diagnosis: ESRD    Recent issues/hospitalizations: no    Vital signs and treatment course reviewed. Discussed with the Unit staff.    On my exam: No edema in the lower extremities.    Changes made during this visit: no    Voice transcription technology was used for dictation of this note and sound-alike words might be erroneously placed despite reviewing the note for accuracy.  Errors in dictation may reflect use of voice recognition software and not all errors in transcription may have been detected prior to signing.    Verlene Mayer, M.D., MD       Electronically signed by Verlene Mayer, MD at 02/15/2023 10:37 AM EST

## 2023-03-01 NOTE — Progress Notes (Signed)
Formatting of this note is different from the original.                                                Hemodialysis Monthly Visit and H and P                                                                       [facility]    Labs, meds, flow sheets, access related metrics and orders reviewed in the [facility] EHR. The labs are available for review on the [facility] EHR system.      CC: Follow up on ESRD    HPI/ Interval history:  I reviewed the dialysis course and flowsheets over the last month. The following issues were reviewed: Blood pressure has been on the lower side and he has been skipping nifedipine at times.  For now we will stop nifedipine and continue Coreg only.  He is interested in home hemodialysis. Overall energy level is well .     Assessment/Plan:  Continue dialysis clinic protocols as indicated. Comprehensive assessment below with plan pertaining to kidney disease and dialysis aspects of care. Medication adjustments and orders placed in the [facility] EHR.    DIALYSIS ASSESSMENT  Stable:  yes  BP well controlled:  yes    Medications reviewed in the [facility] EMR. No changes made    VOLUME STATUS EVALUATION  Cramps or excessive interdialytic weight gains: no  Adjusting Estimated Dry weight: no    ADEQUACY EVALUATION  KT/V at goal: yes    ACCESS EVALUATION  Access issues: no  Access type: AVF/AVG present     ANEMIA EVALUATION  Hemoglobin at goal: yes  Receiving Erythrocyte stimulating agents: yes  Receiving Iron IV:  yes  ESA and Iron IV adjustments discussed with clinical manager and orders placed in the [facility] EMR. Benefits and risks of agents used to treat anemia discussed with the patient who agrees with the treatment plan    BONE AND MINERAL METABOLISM EVALUATION  PTH at goal:  no  Calcium at goal: yes  Phosphorus at goal:  no, discussed the importance of adherence to phosphate binders    NUTRITIONAL STATUS EVALUATION  Albumin at goal: yes  Potassium at goal: yes    Vitals reviewed over  the course of the dialysis treatment and BP trends reviewed over the course of the month   Constitutional: negative for distress  Cardiovascular:  negative for edema  Pulmonary: negative for audible wheezing    Patient Active Problem List   Diagnosis   ? Renal transplant, status post   ? Adjustment disorder with depressed mood   ? Anemia   ? Hepatitis C antibody positive in blood   ? ESRD needing dialysis (CMS/HCC)   ? Hypertension   ? Hyperlipidemia       Verlene Mayer, M.D.     Voice transcription technology was used for dictation of this note and sound-alike words might be erroneously placed despite reviewing the note for accuracy.  Errors in dictation may reflect use of voice recognition software and not all errors in transcription may have  been detected prior to signing.      Electronically signed by Verlene Mayer, MD at 03/02/2023  9:10 AM EST

## 2023-03-08 NOTE — Progress Notes (Signed)
Formatting of this note might be different from the original.  Hemodialysis Basic Visit     Diagnosis: ESRD    Recent issues/hospitalizations: no    Vital signs and treatment course reviewed. Discussed with the Unit staff.    On my exam: No edema in the lower extremities.    Changes made during this visit: no    Voice transcription technology was used for dictation of this note and sound-alike words might be erroneously placed despite reviewing the note for accuracy.  Errors in dictation may reflect use of voice recognition software and not all errors in transcription may have been detected prior to signing.    Verlene Mayer, M.D., MD       Electronically signed by Verlene Mayer, MD at 03/08/2023  9:42 AM EST

## 2023-03-11 NOTE — Telephone Encounter (Signed)
Formatting of this note might be different from the original.  [name] from [facility] called stating that Jahaire had a fistula placed in [place] and is now needing his catheter removed.  I spoke with Dr. Marvetta Gibbons, he agreed to remove pt's catheter.      Electronically signed by Jacquelyne Balint at 03/11/2023 10:37 AM EST

## 2023-03-11 NOTE — Progress Notes (Signed)
Formatting of this note might be different from the original.  Hemodialysis Basic Visit     Diagnosis: ESRD    Recent issues/hospitalizations: no    Vital signs and treatment course reviewed. Discussed with the Unit staff.    On my exam: No edema in the lower extremities.    Changes made during this visit: no    Voice transcription technology was used for dictation of this note and sound-alike words might be erroneously placed despite reviewing the note for accuracy.  Errors in dictation may reflect use of voice recognition software and not all errors in transcription may have been detected prior to signing.    Verlene Mayer, M.D., MD       Electronically signed by Verlene Mayer, MD at 03/11/2023  4:54 PM EST

## 2023-03-11 NOTE — Telephone Encounter (Signed)
Formatting of this note might be different from the original.  Maryelizabeth Rowan, MD, FSVS, FACS, RVT  Board Certified, General Surgery  Board Certified, Vascular and Endovascular Surgery    Katina Dung, MD, FSVS, FACS, RPVI  Board Certified, General Surgery  Board Certified, Vascular and Endovascular Surgery    Theresa Duty, MD , RPVI    Selinda Eon, APRN  Kae Heller, APRN  Jannetta Quint, APRN  Earnie Larsson, APRN  Sallyanne Kuster, PA-C  Eulis Canner, [place]    Name: Roberto Rice  DOB: 1985/12/18  MRN: [id number]    [name] with [facility] called, patient's AVF/AVG is functioning well.  Patient is scheduled for permcath removal on 03/12/2023 arriving at [facility] at  6:30 AM.      No need to be NPO, adjust medications or arrange special transportation as they will not be sedated.    [facility]   Electronically signed by Jacquelyne Balint at 03/11/2023 11:10 AM EST

## 2023-03-12 NOTE — Telephone Encounter (Signed)
Formatting of this note might be different from the original.  Maryelizabeth Rowan, MD, FSVS, FACS, RVT  Board Certified, General Surgery  Board Certified, Vascular and Endovascular Surgery    Katina Dung, MD, FSVS, FACS, RPVI  Board Certified, General Surgery  Board Certified, Vascular and Endovascular Surgery    Theresa Duty, MD , RPVI    Selinda Eon, APRN  Kae Heller, APRN  Jannetta Quint, APRN  Earnie Larsson, APRN  Sallyanne Kuster, PA-C  Eulis Canner, [place]    Name: Roberto Rice   DOB: [birth year]  MRN: [id number]    Your procedure has been rescheduled. Your new date and time are as follows:    OUTPATIENT CATH/VASCULAR LAB PROCEDURES    You have been re-scheduled you for a(n) Catheter Removal    Your procedure is scheduled at [facility] on 04/02/2023        Please report to [facility] ([facility]) on [address] (by [employer]). Your arrival time is 9:00 AM.    Changes to your previous instructions, other than date/time are:    You will be sedated for this procedure and will need to have someone accompany you to this appointment.    Please call Elon Jester at [phone]  or [phone] (Dr. Harriette Bouillon) or Viviann Spare at [phone] (Dr. Candi Leash) should you have any questions, concerns or need to reschedule your appointment(s).    Vascular & Endovascular Surgery   Vascular Diagnostics & Intervention   Electronically signed by Jacquelyne Balint at 03/12/2023  9:10 AM EST

## 2023-03-12 NOTE — Telephone Encounter (Signed)
Formatting of this note might be different from the original.  Roberto Rice was a no show for his scheduled procedure today.  Spoke with [name] at [facility], r/s for 04/02/2023 due to holidays.    Electronically signed by Jacquelyne Balint at 03/12/2023  9:08 AM EST

## 2023-03-13 NOTE — Progress Notes (Signed)
Formatting of this note is different from the original.  Subjective   Subjective:     Patient ID: Roberto Rice is an 37 y.o. male.    Chief Complaint   Patient presents with   ? Follow-up     CKD, Renal transplant        37 y/o male presents to clinic today for follow up CKD, Renal transplant, left cimino fistula creation. Overall has been doing well. Had follow up after fistula at [facility] and states went well. Blood pressure looks good overall. Denies any chest pain or SOB. Denies headaches or dizziness. Reports dialysis has been going well now that he is seeing Dr. Karel Jarvis regularly. Denies any complaints or needs today.     Past Medical History:   ? Blood transfusion, without reported diagnosis   ? Clostridium difficile colitis   ? Epigastric pain   ? Fistula, arteriovenous, acquired (CMS/HCC)   ? Hemodialysis   ? Hepatitis C antibody positive in blood   ? Hyperlipidemia   ? Hypertension   ? Kidney disease   ? Kidney transplant rejection   ? Limb alert care status   ? Nausea and vomiting, unspecified vomiting type   ? S/P dialysis catheter insertion (CMS/HCC)     Social History     Tobacco Use   Smoking Status Never   Smokeless Tobacco Never     Current Outpatient Medications on File Prior to Visit   Medication Sig Dispense Refill   ? lidocaine HCL 2.8 % Gel 1 Application by Apply externally route Twice a day. 100 g 1   ? sulfamethoxazole-trimethoprim (BACTRIM) 400-80 mg per tablet Take 1 Tablet by mouth Monday, Wednesday and Friday.     ? predniSONE (DELTASONE) 5 mg tablet Take 5 mg by mouth Once Daily.     ? mycophenolate sodium (MYFORTIC) 180 mg DR tablet Take 180 mg by mouth As directed. Take 2 capsules in the morning and 1 capsule in the afternoon     ? tacrolimus (PROGRAF) 1 mg Take 6 mg by mouth Twice a day.     ? cholecalciferol, vitamin D3, (VITAMIN D3) Tab tablet Take 2,000 Units by mouth Every morning.     ? NIFEdipine 60 mg ER tablet Take 60 mg by mouth Twice a day.     ? [EXPIRED]  Lactobacillus acidophilus (PROBIOTIC) 10 billion cell Cap Take 1 Capsule by mouth Once Daily for 30 days. 30 Capsule 0   ? [EXPIRED] ferric citrate (AURYXIA) 210 mg iron Tab Take 2 Tabs by mouth Three times a day for 90 days. 540 Tablet 0   ? [EXPIRED] carvediloL (COREG) 12.5 mg tablet Take 1 Tablet by mouth Twice a day for 90 days. 180 Tablet 0   ? atovaquone (MEPRON) 750 mg/5 mL suspension Take 10 mL by mouth once daily with breakfast. for 30 days. SHAKE GENTLEY BEFORE USE. DO NOT FREEZE! MUST BE TAKEN WITH FOOD! 210 mL 1   ? valGANciclovir (VALCYTE) 50 mg/mL SolR Take 2 mL by mouth Monday, Wednesday and Friday for 90 days. 88 mL 0   ? mycophenolate sodium (MYFORTIC) 180 mg DR tablet Take 2 Tabs by mouth Twice a day for 90 days. 360 Tablet 0     Review of Systems   Constitutional:  Negative for activity change, chills, fatigue and fever.   HENT:  Negative for congestion and sore throat.    Respiratory:  Negative for cough, shortness of breath and wheezing.    Cardiovascular:  Negative for chest pain, palpitations and leg swelling.   Gastrointestinal:  Negative for diarrhea, nausea and vomiting.   Genitourinary:  Negative for difficulty urinating.   Musculoskeletal:  Negative for myalgias.   Skin:  Negative for color change.   Neurological:  Negative for dizziness, light-headedness and headaches.   Psychiatric/Behavioral:  Negative for agitation, behavioral problems, dysphoric mood, self-injury and suicidal ideas.      Objective   Objective:     BP 128/60   Pulse (!) 114   Temp 98.3 F (36.8 C)   Resp 16   Ht 5' 11 (180.3 cm)   Wt 83.9 kg (185 lb)   SpO2 97%   BMI 25.80 kg/m     Physical Exam  Vitals reviewed.   Constitutional:       Appearance: Normal appearance.   HENT:      Head: Normocephalic and atraumatic.   Cardiovascular:      Rate and Rhythm: Normal rate and regular rhythm.   Pulmonary:      Effort: Pulmonary effort is normal.      Breath sounds: Normal breath sounds.   Abdominal:      General:  Bowel sounds are normal.      Palpations: Abdomen is soft.   Skin:     General: Skin is warm and dry.   Neurological:      General: No focal deficit present.      Mental Status: He is alert and oriented to person, place, and time.   Psychiatric:         Mood and Affect: Mood normal.     Assessment & Plan:     1. ESRD needing dialysis (CMS/HCC)  Continue dialysis 3 days/week. Continue current medications.    2. Renal transplant, status post  Follow with transplant team as needed.    3. Secondary hypertension  Stable. Continue current medications.    4. Hyperlipidemia, unspecified hyperlipidemia type  Continue current medications.    Body mass index is 25.8 kg/m. Follow-up regarding the patient's high BMI included dietary management counseling, education, and guidance provided, regular exercise and healthy lifestyle recommended, and patient/family counseling regarding appropriate caloric intake.    Return in about 3 months (around 06/11/2023), or if symptoms worsen or fail to improve.  Electronically signed by Domenic Polite, APRN at 03/13/2023  2:45 PM EST

## 2023-03-13 NOTE — Telephone Encounter (Signed)
Formatting of this note might be different from the original.  Maryelizabeth Rowan, MD, FSVS, FACS, RVT  Board Certified, General Surgery  Board Certified, Vascular and Endovascular Surgery    Katina Dung, MD, FSVS, FACS, RPVI  Board Certified, General Surgery  Board Certified, Vascular and Endovascular Surgery    Theresa Duty, MD , RPVI    Selinda Eon, APRN  Kae Heller, APRN  Jannetta Quint, APRN  Earnie Larsson, APRN  Sallyanne Kuster, PA-C  Eulis Canner, [place]    Name: Roberto Rice   DOB: 11/16/1985  MRN: [id number]    Your procedure has been rescheduled. Your new date and time are as follows:    OUTPATIENT CATH/VASCULAR LAB PROCEDURES    You have been re-scheduled you for a(n) Cath Removal    Your procedure is scheduled at [facility] on 03/22/2023        Please report to [facility] ([facility]) on [address] (by [employer]). Your arrival time is 12:00 PM.    Changes to your previous instructions, other than date/time are:    You will be sedated for this procedure and will need to have someone accompany you to this appointment.    Please call Elon Jester at [phone]  or [phone] (Dr. Harriette Bouillon) or Viviann Spare at [phone] (Dr. Candi Leash) should you have any questions, concerns or need to reschedule your appointment(s).    Vascular & Endovascular Surgery   Vascular Diagnostics & Intervention    Electronically signed by Jacquelyne Balint at 03/13/2023 12:03 PM EST

## 2023-03-14 NOTE — Telephone Encounter (Signed)
Formatting of this note might be different from the original.  In keeping with the latest recommendations and updates from the ASA and the APSF, anesthesia will no longer require COVID screening in asymptomatic patients. Patients who demonstrate symptoms consistent with COVID-19 should be screened and if appropriate, tested prior to surgery. Patients with a recent COVID-19 infection will be assessed prior to procedure. Depending on the surgery proposed, age co-morbidities and functional or frailty status of the patient, severity of infection and ongoing symptoms, procedure will be scheduled 11 days after infection.     Maryelizabeth Rowan, MD, FSVS, FACS, RVT  Board Certified, General Surgery  Board Certified, Vascular and Endovascular Surgery    Katina Dung, MD, FSVS, FACS,RPVI  Board Certified, General Surgery  Board Certified, Vascular and Endovascular Surgery    Theresa Duty, MD, RPVI    Selinda Eon, APRN  Kae Heller, APRN  Jannetta Quint, APRN  Earnie Larsson, APRN  Sallyanne Kuster, PA-C  Eulis Canner,  [place]    Name: Roberto Rice   DOB: [birth year]  MRN: [id number]    OUTPATIENT CATH/VASCULAR LAB PROCEDURES    You have been scheduled you for a(n) LUE Fistulogram    Your procedure is scheduled at [facility] on 03/22/2023        Please report to [facility] ([facility]) on [address] (by [employer]). Your arrival time is 12:00 PM.    Please be advised that at the time of scheduling this procedure that approval has not been given by the insurance company. There have been cases when the insurance company has denied this procedure or that the procedure is still pending the day before procedure is scheduled. In the event that there is a delay in determination of authorization or denial by your insurance company, we will contact you to discuss further options.    IT IS THE PATIENT'S RESPONSIBILITY TO NOTIFY OUR OFFICE WITH ANY CHANGES TO INSURANCE, AS ANY CHANGES TO INSURANCE MAY AFFECT THE  PREVIOUS AUTHORIZATION RECEIVED.  FAILURE TO UPDATE OUR OFFICE MAY RESULT IN PROCEDURE BEING CANCELED THE DAY OF PROCEDURE.    Do you agree to schedule procedure at this time knowing that there is a chance that it can be canceled as the insurance has denied the claim or it is still pending? yes     Although we make every effort to ensure your procedure is performed at its scheduled time, please be aware there may be a delay at the hospital that is beyond the physician's control.  - YOU CAN EXPECT TO WAIT 2-4 HOURS BEYOND YOUR SCHEDULED TIME. The hospital will be contacting you regarding pre-registration and pre-admission testing. If you would like to contact them, their number is [phone].     INSTRUCTIONS:    Medications:    Blood thinners DO NOT need to be held for fistulograms or declots.    IF you are taking Metformin, Glucophage, Riomet, Fortamet, Janumet, Glumetza, Metaglip, Glucovance Tradjenta or Avandamet, you will need to hold the medication the morning of your procedure and continue holding for 48 hours afterwards.  Repeat BMP will need to be performed 48 hours after your procedure.  Please contact our office after you've had your testing for results. - DOES NOT need to be held if contrast is not used during the procedure.    IF you take injected insulin in the morning and need to take it on the morning of your procedure, take only half a dose.    DO  NOT STOP PLAVIX AND/OR ASPIRIN. They do not need to be held for this procedure.    Unless mentioned above, take all normal morning medications with just enough water necessary to swallow them.    All other instructions:     IF your procedure is scheduled in the morning, no food, drink, cigarettes/chew, breath mints, candy or gum is allowed after midnight the night prior to your procedure.  Only a minimal amount of water to swallow AM medications is acceptable.    IF your procedure is schedule in the afternoon, you may have light breakfast, such as toast  and coffee no later than .    IF you have an allergy to IV dye, a prescription for Prednisone will be called into the pharmacy indicated in your chart or it will be administered at the hospital prior to starting your procedure. If medication was sent to your pharmacy, you will take 50mg  Prednisone every 6 hours starting 13 hours prior to procedure and 50 mg Benadryl one hour prior to procedure.    IF you have decreased kidney function and ARE NOT on dialysis, you may be hydrated with IV fluids 3 hours prior to and 6 hours following your procedure. Lab work will determine if this is necessary. This will help protect your kidneys from the contrast dye.  We understand this makes for a long day, but your welfare is our primary concern.  IF you are on dialysis, you will need to be dialyzed within 24 hours after your procedure, hydration is not necessary.    You will be sedated for this procedure and will need to have someone accompany you to this appointment.    Please call Elon Jester at [phone]  or [phone] (Dr. Harriette Bouillon) or Viviann Spare at [phone] (Dr. Candi Leash) should you have any questions, concerns or need to reschedule your appointment(s).    Vascular & Endovascular Surgery   Vascular Diagnostics & Intervention    Electronically signed by Jacquelyne Balint at 03/14/2023  1:22 PM EST

## 2023-03-14 NOTE — Telephone Encounter (Signed)
Formatting of this note might be different from the original.  [name] from [facility] called in stating that Roberto Rice's fistula was not working properly and was requesting Dr. Marvetta Gibbons do a fistulogram.  I told her that since Dr. Marvetta Gibbons did not place the fistula, I would have to ask him if he would do a fistulogram on him.  She also stated that Roberto Rice was on schedule for cath removal on 03/22/2023 and would need to be r/s until fistula was working properly.    Electronically signed by Jacquelyne Balint at 03/14/2023 11:33 AM EST

## 2023-03-16 ENCOUNTER — Observation Stay (HOSPITAL_COMMUNITY)
Admission: EM | Admit: 2023-03-16 | Discharge: 2023-03-17 | Disposition: A | Discharge status: Home / Self Care | Payer: Medicaid Other | Attending: Internal Medicine | Admitting: Internal Medicine

## 2023-03-16 ENCOUNTER — Encounter (HOSPITAL_COMMUNITY): Payer: Self-pay

## 2023-03-16 ENCOUNTER — Other Ambulatory Visit: Payer: Self-pay

## 2023-03-16 DIAGNOSIS — Z79899 Other long term (current) drug therapy: Secondary | ICD-10-CM | POA: Diagnosis not present

## 2023-03-16 DIAGNOSIS — I959 Hypotension, unspecified: Secondary | ICD-10-CM

## 2023-03-16 DIAGNOSIS — N186 End stage renal disease: Secondary | ICD-10-CM | POA: Diagnosis present

## 2023-03-16 DIAGNOSIS — K625 Hemorrhage of anus and rectum: Principal | ICD-10-CM

## 2023-03-16 DIAGNOSIS — Z992 Dependence on renal dialysis: Secondary | ICD-10-CM | POA: Diagnosis not present

## 2023-03-16 DIAGNOSIS — K922 Gastrointestinal hemorrhage, unspecified: Principal | ICD-10-CM | POA: Diagnosis present

## 2023-03-16 DIAGNOSIS — Z8774 Personal history of (corrected) congenital malformations of heart and circulatory system: Secondary | ICD-10-CM

## 2023-03-16 DIAGNOSIS — I9589 Other hypotension: Secondary | ICD-10-CM | POA: Diagnosis present

## 2023-03-16 LAB — CBC WITH DIFFERENTIAL/PLATELET
Abs Immature Granulocytes: 0.01 10*3/uL (ref 0.00–0.07)
Basophils Absolute: 0.1 10*3/uL (ref 0.0–0.1)
Basophils Relative: 1 %
Eosinophils Absolute: 0.6 10*3/uL — ABNORMAL HIGH (ref 0.0–0.5)
Eosinophils Relative: 11 %
HCT: 31.9 % — ABNORMAL LOW (ref 39.0–52.0)
Hemoglobin: 10.1 g/dL — ABNORMAL LOW (ref 13.0–17.0)
Immature Granulocytes: 0 %
Lymphocytes Relative: 22 %
Lymphs Abs: 1.1 10*3/uL (ref 0.7–4.0)
MCH: 30 pg (ref 26.0–34.0)
MCHC: 31.7 g/dL (ref 30.0–36.0)
MCV: 94.7 fL (ref 80.0–100.0)
Monocytes Absolute: 0.6 10*3/uL (ref 0.1–1.0)
Monocytes Relative: 12 %
Neutro Abs: 2.8 10*3/uL (ref 1.7–7.7)
Neutrophils Relative %: 54 %
Platelets: 280 10*3/uL (ref 150–400)
RBC: 3.37 MIL/uL — ABNORMAL LOW (ref 4.22–5.81)
RDW: 17.7 % — ABNORMAL HIGH (ref 11.5–15.5)
WBC: 5.2 10*3/uL (ref 4.0–10.5)
nRBC: 0 % (ref 0.0–0.2)

## 2023-03-16 LAB — SAMPLE TO BLOOD BANK

## 2023-03-16 NOTE — ED Notes (Signed)
Wife Hershall Evanson [phone] would like an update asap

## 2023-03-16 NOTE — ED Provider Notes (Signed)
[place] EMERGENCY DEPARTMENT AT [facility] Provider Note   CSN: [id number] Arrival date & time: 03/16/23  2234     History {Add pertinent medical, surgical, social history, OB history to HPI:1} Chief Complaint  Patient presents with   Hypotension    Austin Hardy is a 37 y.o. male.  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  He gets dialysis Monday Wednesday Friday, did dialyze yesterday.  Patient reports that he has been feeling tired all day today.  He had a bowel movement this afternoon and noticed bright red blood on the tissue when he wiped.  He has a history of AVMs that caused bleeding in the past.  Denies abdominal pain.       Home Medications Prior to Admission medications   Medication Sig Start Date End Date Taking? Authorizing Provider  carvedilol (COREG) 25 MG tablet Take 1 tablet (25 mg total) by mouth 2 (two) times daily with a meal. 04/27/14   Mallory, Thedora Hinders, MD  hydrALAZINE (APRESOLINE) 25 MG tablet Take 1 tablet (25 mg total) by mouth 3 (three) times daily. 04/27/14   Mallory, Thedora Hinders, MD  ibuprofen (ADVIL,MOTRIN) 800 MG tablet Take 1 tablet (800 mg total) by mouth 3 (three) times daily. 05/28/15   Rolland Porter, MD  isosorbide mononitrate (IMDUR) 30 MG 24 hr tablet Take 2 tablets (60 mg total) by mouth daily. 04/27/14   Stark Bray, MD  ketotifen (ZADITOR) 0.025 % ophthalmic solution Place 5 drops into both eyes daily as needed (cut in the eye).    [provider]  sevelamer (RENAGEL) 800 MG tablet Take 2,400 mg by mouth 4 (four) times daily -  with meals and at bedtime.    [provider]  sevelamer carbonate (RENVELA) 800 MG tablet Take 2,400 mg by mouth 3 (three) times daily with meals. Three tab.per meal    [provider]  traMADol (ULTRAM) 50 MG tablet Take 1 tablet (50 mg total) by mouth every 6 (six) hours as needed. 05/28/15   Rolland Porter, MD       Allergies    Nsaids    Review of Systems   Review of Systems  Physical Exam Updated Vital Signs BP (!) 97/59   Pulse 88   Temp 98.5 F (36.9 C) (Oral)   Resp 14   Ht 5\' 11"  (1.803 m)   Wt 86.2 kg   SpO2 98%   BMI 26.50 kg/m  Physical Exam Vitals and nursing note reviewed.  Constitutional:      General: He is not in acute distress.    Appearance: He is well-developed.  HENT:     Head: Normocephalic and atraumatic.     Mouth/Throat:     Mouth: Mucous membranes are moist.  Eyes:     General: Vision grossly intact. Gaze aligned appropriately.     Extraocular Movements: Extraocular movements intact.     Conjunctiva/sclera: Conjunctivae normal.  Cardiovascular:     Rate and Rhythm: Normal rate and regular rhythm.     Pulses: Normal pulses.     Heart sounds: Normal heart sounds, S1 normal and S2 normal. No murmur heard.    No friction rub. No gallop.  Pulmonary:     Effort: Pulmonary effort is normal. No respiratory distress.     Breath sounds: Normal breath sounds.  Abdominal:     Palpations: Abdomen is soft.     Tenderness: There is  no abdominal tenderness. There is no guarding or rebound.     Hernia: No hernia is present.  Musculoskeletal:        General: No swelling.     Cervical back: Full passive range of motion without pain, normal range of motion and neck supple. No pain with movement, spinous process tenderness or muscular tenderness. Normal range of motion.     Right lower leg: No edema.     Left lower leg: No edema.  Skin:    General: Skin is warm and dry.     Capillary Refill: Capillary refill takes less than 2 seconds.     Findings: No ecchymosis, erythema, lesion or wound.  Neurological:     Mental Status: He is alert and oriented to person, place, and time.     GCS: GCS eye subscore is 4. GCS verbal subscore is 5. GCS motor subscore is 6.     Cranial Nerves: Cranial nerves 2-12 are intact.     Sensory: Sensation is intact.     Motor: Motor function  is intact. No weakness or abnormal muscle tone.     Coordination: Coordination is intact.  Psychiatric:        Mood and Affect: Mood normal.        Speech: Speech normal.        Behavior: Behavior normal.     ED Results / Procedures / Treatments   Labs (all labs ordered are listed, but only abnormal results are displayed) Labs Reviewed  CBC WITH DIFFERENTIAL/PLATELET  COMPREHENSIVE METABOLIC PANEL  SAMPLE TO BLOOD BANK    EKG None  Radiology No results found.  Procedures Procedures  {Document cardiac monitor, telemetry assessment procedure when appropriate:1}  Medications Ordered in ED Medications - No data to display  ED Course/ Medical Decision Making/ A&P   {   Click here for ABCD2, HEART and other calculatorsREFRESH Note before signing :1}                              Medical Decision Making  ***  {Document critical care time when appropriate:1} {Document review of labs and clinical decision tools ie heart score, Chads2Vasc2 etc:1}  {Document your independent review of radiology images, and any outside records:1} {Document your discussion with family members, caretakers, and with consultants:1} {Document social determinants of health affecting pt's care:1} {Document your decision making why or why not admission, treatments were needed:1} Final Clinical Impression(s) / ED Diagnoses Final diagnoses:  None    Rx / DC Orders ED Discharge Orders     None

## 2023-03-16 NOTE — ED Triage Notes (Signed)
Pt BIB [facility] d/t hypotension.  He is visiting from [place] - goes to Dialysis MWF and had it Friday.  Pt states he has been tried and BP has been low - he noticed Bright red blood when he wiped at approx 2p.

## 2023-03-17 ENCOUNTER — Emergency Department (HOSPITAL_COMMUNITY): Payer: Medicaid Other

## 2023-03-17 DIAGNOSIS — D649 Anemia, unspecified: Secondary | ICD-10-CM

## 2023-03-17 DIAGNOSIS — K922 Gastrointestinal hemorrhage, unspecified: Secondary | ICD-10-CM | POA: Diagnosis not present

## 2023-03-17 DIAGNOSIS — K625 Hemorrhage of anus and rectum: Secondary | ICD-10-CM

## 2023-03-17 DIAGNOSIS — I959 Hypotension, unspecified: Secondary | ICD-10-CM | POA: Diagnosis not present

## 2023-03-17 DIAGNOSIS — N186 End stage renal disease: Secondary | ICD-10-CM

## 2023-03-17 DIAGNOSIS — Z8774 Personal history of (corrected) congenital malformations of heart and circulatory system: Secondary | ICD-10-CM

## 2023-03-17 LAB — IRON AND TIBC
Iron: 67 ug/dL (ref 45–182)
Saturation Ratios: 41 % — ABNORMAL HIGH (ref 17.9–39.5)
TIBC: 165 ug/dL — ABNORMAL LOW (ref 250–450)
UIBC: 98 ug/dL

## 2023-03-17 LAB — HEMOGLOBIN A1C
Hgb A1c MFr Bld: 5.7 % — ABNORMAL HIGH (ref 4.8–5.6)
Mean Plasma Glucose: 116.89 mg/dL

## 2023-03-17 LAB — COMPREHENSIVE METABOLIC PANEL
ALT: 7 U/L (ref 0–44)
ALT: 9 U/L (ref 0–44)
AST: 21 U/L (ref 15–41)
AST: 21 U/L (ref 15–41)
Albumin: 2.7 g/dL — ABNORMAL LOW (ref 3.5–5.0)
Albumin: 2.9 g/dL — ABNORMAL LOW (ref 3.5–5.0)
Alkaline Phosphatase: 60 U/L (ref 38–126)
Alkaline Phosphatase: 66 U/L (ref 38–126)
Anion gap: 13 (ref 5–15)
Anion gap: 13 (ref 5–15)
BUN: 33 mg/dL — ABNORMAL HIGH (ref 6–20)
BUN: 36 mg/dL — ABNORMAL HIGH (ref 6–20)
CO2: 23 mmol/L (ref 22–32)
CO2: 27 mmol/L (ref 22–32)
Calcium: 7.7 mg/dL — ABNORMAL LOW (ref 8.9–10.3)
Calcium: 7.7 mg/dL — ABNORMAL LOW (ref 8.9–10.3)
Chloride: 102 mmol/L (ref 98–111)
Chloride: 99 mmol/L (ref 98–111)
Creatinine, Ser: 11.3 mg/dL — ABNORMAL HIGH (ref 0.61–1.24)
Creatinine, Ser: 11.62 mg/dL — ABNORMAL HIGH (ref 0.61–1.24)
GFR, Estimated: 5 mL/min — ABNORMAL LOW (ref >60)
GFR, Estimated: 5 mL/min — ABNORMAL LOW (ref >60)
Glucose, Bld: 105 mg/dL — ABNORMAL HIGH (ref 70–99)
Glucose, Bld: 68 mg/dL — ABNORMAL LOW (ref 70–99)
Potassium: 4.2 mmol/L (ref 3.5–5.1)
Potassium: 4.3 mmol/L (ref 3.5–5.1)
Sodium: 138 mmol/L (ref 135–145)
Sodium: 139 mmol/L (ref 135–145)
Total Bilirubin: 0.8 mg/dL (ref <1.2)
Total Bilirubin: 1 mg/dL (ref <1.2)
Total Protein: 6.7 g/dL (ref 6.5–8.1)
Total Protein: 7.1 g/dL (ref 6.5–8.1)

## 2023-03-17 LAB — CBC
HCT: 28.4 % — ABNORMAL LOW (ref 39.0–52.0)
Hemoglobin: 9 g/dL — ABNORMAL LOW (ref 13.0–17.0)
MCH: 30 pg (ref 26.0–34.0)
MCHC: 31.7 g/dL (ref 30.0–36.0)
MCV: 94.7 fL (ref 80.0–100.0)
Platelets: 260 10*3/uL (ref 150–400)
RBC: 3 MIL/uL — ABNORMAL LOW (ref 4.22–5.81)
RDW: 17.9 % — ABNORMAL HIGH (ref 11.5–15.5)
WBC: 5.3 10*3/uL (ref 4.0–10.5)
nRBC: 0 % (ref 0.0–0.2)

## 2023-03-17 LAB — HEMOGLOBIN AND HEMATOCRIT, BLOOD
HCT: 28.1 % — ABNORMAL LOW (ref 39.0–52.0)
HCT: 30.5 % — ABNORMAL LOW (ref 39.0–52.0)
Hemoglobin: 8.8 g/dL — ABNORMAL LOW (ref 13.0–17.0)
Hemoglobin: 9.2 g/dL — ABNORMAL LOW (ref 13.0–17.0)

## 2023-03-17 LAB — HIV ANTIBODY (ROUTINE TESTING W REFLEX): HIV Screen 4th Generation wRfx: NONREACTIVE

## 2023-03-17 LAB — FOLATE: Folate: 6.9 ng/mL (ref >5.9)

## 2023-03-17 LAB — RETICULOCYTES
Immature Retic Fract: 9.7 % (ref 2.3–15.9)
RBC.: 3 MIL/uL — ABNORMAL LOW (ref 4.22–5.81)
Retic Count, Absolute: 37.8 10*3/uL (ref 19.0–186.0)
Retic Ct Pct: 1.3 % (ref 0.4–3.1)

## 2023-03-17 LAB — FERRITIN: Ferritin: 706 ng/mL — ABNORMAL HIGH (ref 24–336)

## 2023-03-17 LAB — VITAMIN B12: Vitamin B-12: 640 pg/mL (ref 180–914)

## 2023-03-17 MED ORDER — MIDODRINE HCL 5 MG PO TABS
10.0000 mg | ORAL_TABLET | Freq: Three times a day (TID) | ORAL | Status: DC
Start: 2023-03-17 — End: 2023-03-17

## 2023-03-17 MED ORDER — SODIUM CHLORIDE 0.9 % IV BOLUS
500.0000 mL | Freq: Once | INTRAVENOUS | Status: AC
  Administered 2023-03-17: 500 mL via INTRAVENOUS

## 2023-03-17 MED ORDER — MIDODRINE HCL 5 MG PO TABS
5.0000 mg | ORAL_TABLET | Freq: Three times a day (TID) | ORAL | Status: DC
  Administered 2023-03-17: 5 mg via ORAL
  Filled 2023-03-17: qty 1

## 2023-03-17 MED ORDER — PANTOPRAZOLE SODIUM 40 MG IV SOLR: 40.0000 mg | Freq: Two times a day (BID) | INTRAVENOUS | Status: DC

## 2023-03-17 MED ORDER — ACETAMINOPHEN 325 MG PO TABS: 650.0000 mg | ORAL_TABLET | Freq: Four times a day (QID) | ORAL | Status: DC | PRN

## 2023-03-17 MED ORDER — MIDODRINE HCL 5 MG PO TABS
5.0000 mg | ORAL_TABLET | Freq: Once | ORAL | Status: AC
  Administered 2023-03-17: 5 mg via ORAL
  Filled 2023-03-17: qty 1

## 2023-03-17 MED ORDER — ALBUTEROL SULFATE (2.5 MG/3ML) 0.083% IN NEBU: 2.5000 mg | INHALATION_SOLUTION | RESPIRATORY_TRACT | Status: DC | PRN

## 2023-03-17 MED ORDER — MIDODRINE HCL 10 MG PO TABS: 10.0000 mg | ORAL_TABLET | Freq: Three times a day (TID) | ORAL | Status: AC

## 2023-03-17 MED ORDER — ACETAMINOPHEN 650 MG RE SUPP: 650.0000 mg | Freq: Four times a day (QID) | RECTAL | Status: DC | PRN

## 2023-03-17 NOTE — Discharge Instructions (Signed)
Follow up with primary care and/or GI back home to discuss repeat colonoscopy

## 2023-03-17 NOTE — Hospital Course (Signed)
Austin Hardy is a 37 y.o. male with medical history significant of AVMs, ESRD on HD MWF s/p kidney transplant failure 08/2022 now with left chest tunneled cath since then, and close to 2 months s/p AVF placement.   He presented to the ER after developing an episode of red blood noted when wiping after a bowel movement.  He denied true hematochezia or rectal bleeding.  Also denied any hemoptysis or hematemesis. He has had persistent hypotension outpatient and been recently prescribed midodrine but had not yet started. Due to the blood noted, he presented for further eval.  Labs noted in care everywhere over the past year show baseline hemoglobin approximately 12 g/dL.  His hemoglobin on admission was 10.1 g/dL with some mild hemodilution after fluids.  Nadir of 8.8 g/dL and remained stable and somewhat improved prior to discharge at 9.2 g/dL with no further evidence of bleeding.  He declined significant inpatient workup and felt comfortable following up with GI back home in [place] for repeat colonoscopy as indicated. Potential etiology was considered possibly hemorrhoids.  He was evaluated by GI in the ER as well and also recommended for consideration of colonoscopy upon returning home. He already has afternoon hemodialysis scheduled on 03/18/2023 in [place] as well and did not feel inclined to also remain in the hospital for any dialysis. Lab work also did not suggest any need for urgent HD.  He responded well to fluids and being started on midodrine and was recommended to continue midodrine at discharge which he already has.  Blood pressure at discharge had improved to 119/77.

## 2023-03-17 NOTE — H&P (Addendum)
History and Physical    DEMARQUES FREELOVE Hardy:[id number] DOB: [birth year] DOA: 03/16/2023  PCP: Pcp, No  Patient coming from: family's home  I have personally briefly reviewed patient's old medical records in [facility]  Chief Complaint: hypotension/ BRBPR on toilet tissue  HPI: Austin Hardy is a 37 y.o. 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  Patient presents to ED BIB EMS due to hypotension, fatigue insetting of one episode BRBPR  on toilet tissue,after having a bowel movement. Per patient has had BM with brown stools since admit to ED without any BRBPR noted.  Per wife and patient they are more concerned about persistent low blood pressures at home , dizziness and persistent fatigue. Per patient and wife lower blood pressures started after he had his fistula placed 10/24. Patient notes he has been able to tolerate HD however has had periods of hypotension on HD as well. Per patient, he was told that if persistent they would start him on midodrine. Currently patient no associated n/v/d/abdominal pain/ sob/ chest pain /fever or chills.  He states other that fatigue he has no other complaints currently.  ED Course:  Afeb , bp 97/59-88/43 Cxr NAD Labs Wbc 5.2, hgb 10.1, MCV 94.7 Na 139, K 4.2, CL 99, Glu 105, cr 11.30, CA 7.7,  Hgb:16.3 (02/16/22 remote), 10.1 repeat 8.8 EKG:NSR PAC QT 487  Patient admitted with diagnosis of GI bleed with concern for symptomatic anemia of acute blood loss.  Review of Systems: As per HPI otherwise 10 point review of systems negative.   Past Medical History:  Diagnosis Date   Cardiomegaly    Chronic kidney disease    Hypertension     Past Surgical History:  Procedure Laterality Date   AV FISTULA PLACEMENT Right March 2013   FRACTURE SURGERY Left 2005   Ankle     reports that he quit smoking about  19 years ago. He has never used smokeless tobacco. He reports current drug use. He reports that he does not drink alcohol.  Allergies  Allergen Reactions   Nsaids Other (See Comments)    CANNOT TAKE DUE TO KIDNEY FAILURE   CANNOT TAKE DUE TO KIDNEY FAILURE   CANNOT TAKE DUE TO KIDNEY FAILURE    Family History  Problem Relation Age of Onset   Diabetes Mother    Hypertension Mother    Diabetes Father    Hypertension Father     Prior to Admission medications   Medication Sig Start Date End Date Taking? Authorizing Provider  carvedilol (COREG) 25 MG tablet Take 1 tablet (25 mg total) by mouth 2 (two) times daily with a meal. 04/27/14   Mallory, Thedora Hinders, MD  hydrALAZINE (APRESOLINE) 25 MG tablet Take 1 tablet (25 mg total) by mouth 3 (three) times daily. 04/27/14   Mallory, Thedora Hinders, MD  ibuprofen (ADVIL,MOTRIN) 800 MG tablet Take 1 tablet (800 mg total) by mouth 3 (three) times daily. 05/28/15   Rolland Porter, MD  isosorbide mononitrate (IMDUR) 30 MG 24 hr tablet Take 2 tablets (60 mg total) by mouth daily. 04/27/14   Stark Bray, MD  ketotifen (ZADITOR) 0.025 % ophthalmic solution Place 5 drops into both eyes daily as needed (cut in the eye).    [provider]  sevelamer (RENAGEL) 800 MG tablet Take 2,400 mg by mouth 4 (four) times  daily -  with meals and at bedtime.    [provider]  sevelamer carbonate (RENVELA) 800 MG tablet Take 2,400 mg by mouth 3 (three) times daily with meals. Three tab.per meal    [provider]  traMADol (ULTRAM) 50 MG tablet Take 1 tablet (50 mg total) by mouth every 6 (six) hours as needed. 05/28/15   Rolland Porter, MD    Physical Exam: Vitals:   03/17/23 0120 03/17/23 0121 03/17/23 0245 03/17/23 0306  BP:  (!) 95/46  (!) 90/48  Pulse:  88 93 89  Resp:  16 16 13   Temp: 98.8 F (37.1 C) 98.8 F (37.1 C)    TempSrc: Oral     SpO2:  98% 96% 96%  Weight:      Height:        Constitutional: NAD, calm, comfortable Vitals:    03/17/23 0120 03/17/23 0121 03/17/23 0245 03/17/23 0306  BP:  (!) 95/46  (!) 90/48  Pulse:  88 93 89  Resp:  16 16 13   Temp: 98.8 F (37.1 C) 98.8 F (37.1 C)    TempSrc: Oral     SpO2:  98% 96% 96%  Weight:      Height:       Eyes: EOMI, conjunctivae normal ENMT: Mucous membranes are moist. Posterior pharynx clear of any exudate or lesions.Normal dentition.  Neck: normal, supple, no masses, no thyromegaly Respiratory: clear to auscultation bilaterally, no wheezing, no crackles. Normal respiratory effort. No accessory muscle use.  Cardiovascular: Regular rate and rhythm, no murmurs / rubs / gallops. No extremity edema. 2+ pedal pulses. No carotid bruits.  Abdomen: no tenderness, no masses palpated. No hepatosplenomegaly. Bowel sounds positive.  Musculoskeletal: no clubbing / cyanosis. No joint deformity upper and lower extremities. Good ROM, no contractures. Normal muscle tone.  Skin: no rashes, lesions, ulcers. No induration Neurologic: CN 2-12 grossly intact. MAE x 4 Psychiatric: Normal judgment and insight. Alert and oriented x 3. Normal mood.    Labs on Admission: I have personally reviewed following labs and imaging studies  CBC: Recent Labs  Lab 03/16/23 2300 03/17/23 0308  WBC 5.2  --   NEUTROABS 2.8  --   HGB 10.1* 8.8*  HCT 31.9* 28.1*  MCV 94.7  --   PLT 280  --    Basic Metabolic Panel: Recent Labs  Lab 03/16/23 2354  NA 139  K 4.2  CL 99  CO2 27  GLUCOSE 105*  BUN 33*  CREATININE 11.30*  CALCIUM 7.7*   GFR: Estimated Creatinine Clearance: 9.4 mL/min (A) (by C-G formula based on SCr of 11.3 mg/dL (H)). Liver Function Tests: Recent Labs  Lab 03/16/23 2354  AST 21  ALT 7  ALKPHOS 66  BILITOT 1.0  PROT 7.1  ALBUMIN 2.9*   No results for input(s): "LIPASE", "AMYLASE" in the last 168 hours. No results for input(s): "AMMONIA" in the last 168 hours. Coagulation Profile: No results for input(s): "INR", "PROTIME" in the last 168 hours. Cardiac  Enzymes: No results for input(s): "CKTOTAL", "CKMB", "CKMBINDEX", "TROPONINI" in the last 168 hours. BNP (last 3 results) No results for input(s): "PROBNP" in the last 8760 hours. HbA1C: No results for input(s): "HGBA1C" in the last 72 hours. CBG: No results for input(s): "GLUCAP" in the last 168 hours. Lipid Profile: No results for input(s): "CHOL", "HDL", "LDLCALC", "TRIG", "CHOLHDL", "LDLDIRECT" in the last 72 hours. Thyroid Function Tests: No results for input(s): "TSH", "T4TOTAL", "FREET4", "T3FREE", "THYROIDAB" in the last 72 hours. Anemia  Panel: No results for input(s): "VITAMINB12", "FOLATE", "FERRITIN", "TIBC", "IRON", "RETICCTPCT" in the last 72 hours. Urine analysis:    Component Value Date/Time   COLORURINE YELLOW 02/16/2022 1547   APPEARANCEUR HAZY (A) 02/16/2022 1547   LABSPEC 1.024 02/16/2022 1547   PHURINE 5.0 02/16/2022 1547   GLUCOSEU NEGATIVE 02/16/2022 1547   HGBUR NEGATIVE 02/16/2022 1547   BILIRUBINUR NEGATIVE 02/16/2022 1547   KETONESUR NEGATIVE 02/16/2022 1547   PROTEINUR 30 (A) 02/16/2022 1547   UROBILINOGEN 0.2 03/02/2014 2340   NITRITE NEGATIVE 02/16/2022 1547   LEUKOCYTESUR NEGATIVE 02/16/2022 1547    Radiological Exams on Admission: DG Chest Port 1 View Result Date: 03/17/2023 CLINICAL DATA:  Dialysis access problem.  Weakness. EXAM: PORTABLE CHEST 1 VIEW COMPARISON:  02/16/2022 FINDINGS: Left dialysis catheter is in place with the tip in the right atrium. Heart and mediastinal contours are within normal limits. No focal opacities or effusions. No acute bony abnormality. IMPRESSION: No active disease. Electronically Signed   By: Charlett Nose M.D.   On: 03/17/2023 00:41    EKG: Independently reviewed. See above  Assessment/Plan   Acute GI bleed  -history of  colonic AVMs -dropping H/H  -relative hypotension and fatigue suggestive of symptomatic anemia vs due to recently resumption of HD -admit to progressive care  -check anemia panel   -transfuse 1 unit with next HD or sooner if H/H less than 8 - gi consult for further assistance  -clear liquids  -ppi   ESRD MWF -renal consult for further assistance   Hypotension - has progressive hypotension over the last few months now off all bp medications  - possible related to resuming HD  - continue to monitor  - will place on prn midodrine       DVT prophylaxis: scd Code Status: full/ as discussed per patient wishes in event of cardiac arrest  Family Communication: Enis Tuchman [phone] Disposition Plan: patient  expected to be admitted greater than 2 midnights  Consults called: GI Admission status: progressive care   Lurline Del MD [facility]  If 7PM-7AM, please contact night-coverage [email or web address] Password [facility]  03/17/2023, 4:22 AM

## 2023-03-17 NOTE — Discharge Summary (Signed)
Physician Discharge Summary   Austin Hardy NWG:[id number] DOB: 03-05-1986 DOA: 03/16/2023  PCP: Oneita Hurt, No  Admit date: 03/16/2023 Discharge date: 03/17/2023   Admitted From: Home Disposition:  Home Discharging physician: Lewie Chamber, MD Barriers to discharge: none  Recommendations at discharge: Follow up with GI in [place] and discuss repeat colonoscopy  Adjust midodrine further as needed   Discharge Condition: stable CODE STATUS: Full Diet recommendation:  Diet Orders (From admission, onward)     Start     Ordered   03/17/23 0627  Diet clear liquid Room service appropriate? Yes; Fluid consistency: Thin  Diet effective now       Question Answer Comment  Room service appropriate? Yes   Fluid consistency: Thin      03/17/23 0626            Hospital Course: Austin Hardy is a 37 y.o. male with medical history significant of AVMs, ESRD on HD MWF s/p kidney transplant failure 08/2022 now with left chest tunneled cath since then, and close to 2 months s/p AVF placement.   He presented to the ER after developing an episode of red blood noted when wiping after a bowel movement.  He denied true hematochezia or rectal bleeding.  Also denied any hemoptysis or hematemesis. He has had persistent hypotension outpatient and been recently prescribed midodrine but had not yet started. Due to the blood noted, he presented for further eval.  Labs noted in care everywhere over the past year show baseline hemoglobin approximately 12 g/dL.  His hemoglobin on admission was 10.1 g/dL with some mild hemodilution after fluids.  Nadir of 8.8 g/dL and remained stable and somewhat improved prior to discharge at 9.2 g/dL with no further evidence of bleeding.  He declined significant inpatient workup and felt comfortable following up with GI back home in [place] for repeat colonoscopy as indicated. Potential etiology was considered possibly hemorrhoids.  He was evaluated by GI in the ER as well and also  recommended for consideration of colonoscopy upon returning home. He already has afternoon hemodialysis scheduled on 03/18/2023 in [place] as well and did not feel inclined to also remain in the hospital for any dialysis. Lab work also did not suggest any need for urgent HD.  He responded well to fluids and being started on midodrine and was recommended to continue midodrine at discharge which he already has.  Blood pressure at discharge had improved to 119/77.   The patient's acute and chronic medical conditions were treated accordingly. On day of discharge, patient was felt deemed stable for discharge. Patient/family member advised to call PCP or come back to ER if needed.   Principal Diagnosis: GI bleed  Discharge Diagnoses: Active Hospital Problems   Diagnosis Date Noted   GI bleed 03/17/2023    Priority: 1.   Hypotension 03/17/2023    Priority: 2.   History of arteriovenous malformation (AVM) 03/17/2023   ESRD (end stage renal disease) [facility])     Resolved Hospital Problems  No resolved problems to display.     Discharge Instructions     Increase activity slowly   Complete by: As directed       Allergies as of 03/17/2023       Reactions   Nsaids Other (See Comments)   CANNOT TAKE DUE TO KIDNEY FAILURE         Medication List     TAKE these medications    midodrine 10 MG tablet Commonly known as: PROAMATINE Take 1 tablet (  10 mg total) by mouth 3 (three) times daily with meals.   Velphoro 500 MG chewable tablet Generic drug: sucroferric oxyhydroxide Chew 500 mg by mouth 3 (three) times daily with meals.        Allergies  Allergen Reactions   Nsaids Other (See Comments)    CANNOT TAKE DUE TO KIDNEY FAILURE       Consultations: GI Nephrology   Procedures:   Discharge Exam: BP 119/77   Pulse 80   Temp 98.8 F (37.1 C) (Oral)   Resp 14   Ht 5\' 11"  (1.803 m)   Wt 86.2 kg   SpO2 100%   BMI 26.50 kg/m  Physical Exam Constitutional:       General: He is not in acute distress.    Appearance: Normal appearance.  HENT:     Head: Normocephalic and atraumatic.     Mouth/Throat:     Mouth: Mucous membranes are moist.  Eyes:     Extraocular Movements: Extraocular movements intact.  Cardiovascular:     Rate and Rhythm: Normal rate and regular rhythm.  Pulmonary:     Effort: Pulmonary effort is normal. No respiratory distress.     Breath sounds: Normal breath sounds. No wheezing.  Abdominal:     General: Bowel sounds are normal. There is no distension.     Palpations: Abdomen is soft.     Tenderness: There is no abdominal tenderness.  Musculoskeletal:        General: Normal range of motion.     Cervical back: Normal range of motion and neck supple.  Skin:    General: Skin is warm and dry.  Neurological:     General: No focal deficit present.     Mental Status: He is alert.  Psychiatric:        Mood and Affect: Mood normal.        Behavior: Behavior normal.      The results of significant diagnostics from this hospitalization (including imaging, microbiology, ancillary and laboratory) are listed below for reference.   Microbiology: No results found for this or any previous visit (from the past 240 hours).   Labs: BNP (last 3 results) No results for input(s): "BNP" in the last 8760 hours. Basic Metabolic Panel: Recent Labs  Lab 03/16/23 2354 03/17/23 0542  NA 139 138  K 4.2 4.3  CL 99 102  CO2 27 23  GLUCOSE 105* 68*  BUN 33* 36*  CREATININE 11.30* 11.62*  CALCIUM 7.7* 7.7*   Liver Function Tests: Recent Labs  Lab 03/16/23 2354 03/17/23 0542  AST 21 21  ALT 7 9  ALKPHOS 66 60  BILITOT 1.0 0.8  PROT 7.1 6.7  ALBUMIN 2.9* 2.7*   No results for input(s): "LIPASE", "AMYLASE" in the last 168 hours. No results for input(s): "AMMONIA" in the last 168 hours. CBC: Recent Labs  Lab 03/16/23 2300 03/17/23 0308 03/17/23 0542 03/17/23 0849  WBC 5.2  --  5.3  --   NEUTROABS 2.8  --   --   --   HGB  10.1* 8.8* 9.0* 9.2*  HCT 31.9* 28.1* 28.4* 30.5*  MCV 94.7  --  94.7  --   PLT 280  --  260  --    Cardiac Enzymes: No results for input(s): "CKTOTAL", "CKMB", "CKMBINDEX", "TROPONINI" in the last 168 hours. BNP: Invalid input(s): "POCBNP" CBG: No results for input(s): "GLUCAP" in the last 168 hours. D-Dimer No results for input(s): "DDIMER" in the last 72 hours. Hgb A1c  Recent Labs    03/17/23 0542  HGBA1C 5.7*   Lipid Profile No results for input(s): "CHOL", "HDL", "LDLCALC", "TRIG", "CHOLHDL", "LDLDIRECT" in the last 72 hours. Thyroid function studies No results for input(s): "TSH", "T4TOTAL", "T3FREE", "THYROIDAB" in the last 72 hours.  Invalid input(s): "FREET3" Anemia work up Recent Labs    03/17/23 0542  VITAMINB12 640  FOLATE 6.9  FERRITIN 706*  TIBC 165*  IRON 67  RETICCTPCT 1.3   Urinalysis    Component Value Date/Time   COLORURINE YELLOW 02/16/2022 1547   APPEARANCEUR HAZY (A) 02/16/2022 1547   LABSPEC 1.024 02/16/2022 1547   PHURINE 5.0 02/16/2022 1547   GLUCOSEU NEGATIVE 02/16/2022 1547   HGBUR NEGATIVE 02/16/2022 1547   BILIRUBINUR NEGATIVE 02/16/2022 1547   KETONESUR NEGATIVE 02/16/2022 1547   PROTEINUR 30 (A) 02/16/2022 1547   UROBILINOGEN 0.2 03/02/2014 2340   NITRITE NEGATIVE 02/16/2022 1547   LEUKOCYTESUR NEGATIVE 02/16/2022 1547   Sepsis Labs Recent Labs  Lab 03/16/23 2300 03/17/23 0542  WBC 5.2 5.3   Microbiology No results found for this or any previous visit (from the past 240 hours).  Procedures/Studies: DG Chest Port 1 View Result Date: 03/17/2023 CLINICAL DATA:  Dialysis access problem.  Weakness. EXAM: PORTABLE CHEST 1 VIEW COMPARISON:  02/16/2022 FINDINGS: Left dialysis catheter is in place with the tip in the right atrium. Heart and mediastinal contours are within normal limits. No focal opacities or effusions. No acute bony abnormality. IMPRESSION: No active disease. Electronically Signed   By: Charlett Nose M.D.   On:  03/17/2023 00:41     Time coordinating discharge: Over 30 minutes    Lewie Chamber, MD  [facility] 03/17/2023, 11:41 AM

## 2023-03-17 NOTE — Consult Note (Addendum)
Consultation  Referring Provider:   [facility] Primary Care Physician:  Pcp, No Primary Gastroenterologist: [facility] in [place]       Reason for Consultation:     Rectal bleeding and anemia, and hypotension in setting of end-stage renal disease on dialysis DOA: 03/16/2023         Hospital Day: 2         HPI:   Austin Hardy is a 37 y.o. male with past medical history significant for hypertension, hyperlipidemia end-stage renal disease status post failed renal transplant 2018 on dialysis since June 2024 left chest tunneled catheter, status post fistula placement 10/24, chronic anemia, history of C. 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  Patient is primarily been getting care at [name] in [place]. 07/01/2022 colonoscopy for hematochezia prep was fair nonbleeding colonic angio dysplastic lesions status post monopolar probe nonbleeding internal hemorrhoids no specimens collected 12/03/2022 EGD for epigastric discomfort showed normal esophagus, gastritis, duodenopathy 01/11/2023 normocytic anemia 12.6 01/08/2023 hepatitis C reactive but RNA negative, normal liver function  Presents to the ER with hypotension and bright red blood per rectum on toilet paper.   Work up notable for  Afebrile, soft blood pressure 88/43- 97/59, blood pressure allows in the room with patient was 110/60 Unremarkable chest x-ray WBC 5.2, Hgb 10.1 compared to 12.6 October of this year (baseline prior to October was closer to 9- 10) currently down to Hgb 9 Iron 67, TIBC 165, saturation ratio is 41, ferritin 706, folate 6.9, retake count 37.8, immature reticulocyte fract 9.7% does not have a robust response Normal platelets, normal liver function, albumin 2.7 BUN 36, creatinine 11.62  No family was present during my examination. Last dialysis was Friday has upcoming appointment Monday for dialysis. Patient states  since having fistula placed in October recent having some hypotension especially with dialysis a little bit outside of dialysis, did mention adding midodrine last dialysis session but this was not done. Patient had rectal bleeding and GI bleed and April status post colonic AVM with APC, EGD gastritis. Patient was accompanied by his wife here in [place] to pick up their children to come back to [place] for Christmas, has 8 children total with blended family youngest 45 oldest 65. He was at his mother's house and had fatigue, dizziness had some hypotension had 1 episode of bright red blood on toilet paper only subsequent school stools being brown without any bleeding. Patient denies any nausea, vomiting, abdominal pain, fever, chills, shortness of breath or chest pain. Patient states he has follow-up this coming Monday at [facility] in [place] for dialysis as well as venogram for his fistula. He is feeling well now on midodrine blood pressure in the room was there was 10 9/72, states his wife is driving him and the kids back to [place] and he feels comfortable leaving and getting follow-up care there.  Abnormal ED labs: Abnormal Labs Reviewed  CBC WITH DIFFERENTIAL/PLATELET - Abnormal; Notable for the following components:      Result Value   RBC 3.37 (*)    Hemoglobin 10.1 (*)    HCT 31.9 (*)    RDW 17.7 (*)    Eosinophils Absolute 0.6 (*)    All other components within normal limits  COMPREHENSIVE METABOLIC PANEL - Abnormal; Notable for the following components:   Glucose, Bld 105 (*)    BUN 33 (*)    Creatinine,  Ser 11.30 (*)    Calcium 7.7 (*)    Albumin 2.9 (*)    GFR, Estimated 5 (*)    All other components within normal limits  HEMOGLOBIN AND HEMATOCRIT, BLOOD - Abnormal; Notable for the following components:   Hemoglobin 8.8 (*)    HCT 28.1 (*)    All other components within normal limits  IRON AND TIBC - Abnormal; Notable for the following components:    TIBC 165 (*)    Saturation Ratios 41 (*)    All other components within normal limits  FERRITIN - Abnormal; Notable for the following components:   Ferritin 706 (*)    All other components within normal limits  RETICULOCYTES - Abnormal; Notable for the following components:   RBC. 3.00 (*)    All other components within normal limits  HEMOGLOBIN A1C - Abnormal; Notable for the following components:   Hgb A1c MFr Bld 5.7 (*)    All other components within normal limits  CBC - Abnormal; Notable for the following components:   RBC 3.00 (*)    Hemoglobin 9.0 (*)    HCT 28.4 (*)    RDW 17.9 (*)    All other components within normal limits  COMPREHENSIVE METABOLIC PANEL - Abnormal; Notable for the following components:   Glucose, Bld 68 (*)    BUN 36 (*)    Creatinine, Ser 11.62 (*)    Calcium 7.7 (*)    Albumin 2.7 (*)    GFR, Estimated 5 (*)    All other components within normal limits    Past Medical History:  Diagnosis Date   Cardiomegaly    Chronic kidney disease    Hypertension     Surgical History:  He  has a past surgical history that includes Fracture surgery (Left, 2005) and AV fistula placement (Right, March 2013). Family History:  His family history includes Diabetes in his father and mother; Hypertension in his father and mother. Social History:   reports that he quit smoking about 19 years ago. He has never used smokeless tobacco. He reports current drug use. He reports that he does not drink alcohol.  Prior to Admission medications   Medication Sig Start Date End Date Taking? Authorizing Provider  sucroferric oxyhydroxide (VELPHORO) 500 MG chewable tablet Chew 500 mg by mouth 3 (three) times daily with meals. 03/08/23  Yes [provider]    Current Facility-Administered Medications  Medication Dose Route Frequency Provider Last Rate Last Admin   acetaminophen (TYLENOL) tablet 650 mg  650 mg Oral Q6H PRN Lurline Del, MD       Or    acetaminophen (TYLENOL) suppository 650 mg  650 mg Rectal Q6H PRN Lurline Del, MD       albuterol (PROVENTIL) (2.5 MG/3ML) 0.083% nebulizer solution 2.5 mg  2.5 mg Nebulization Q2H PRN Lurline Del, MD       midodrine (PROAMATINE) tablet 10 mg  10 mg Oral TID WC Lewie Chamber, MD       midodrine (PROAMATINE) tablet 5 mg  5 mg Oral Once Lewie Chamber, MD       pantoprazole (PROTONIX) injection 40 mg  40 mg Intravenous Q12H Lurline Del, MD       Current Outpatient Medications  Medication Sig Dispense Refill   sucroferric oxyhydroxide (VELPHORO) 500 MG chewable tablet Chew 500 mg by mouth 3 (three) times daily with meals.      Allergies as of 03/16/2023 - Review Complete 03/16/2023  Allergen  Reaction Noted   Nsaids Other (See Comments) 11/16/2016    Review of Systems:    Constitutional: No weight loss, fever, chills, weakness or fatigue HEENT: Eyes: No change in vision               Ears, Nose, Throat:  No change in hearing or congestion Skin: No rash or itching Cardiovascular: No chest pain, chest pressure or palpitations   Respiratory: No SOB or cough Gastrointestinal: See HPI and otherwise negative Genitourinary: No dysuria or change in urinary frequency Neurological: No headache, dizziness or syncope Musculoskeletal: No new muscle or joint pain Hematologic: No bleeding or bruising Psychiatric: No history of depression or anxiety     Physical Exam:  Vital signs in last 24 hours: Temp:  [98.5 F (36.9 C)-98.8 F (37.1 C)] 98.8 F (37.1 C) (12/22 0546) Pulse Rate:  [88-101] 88 (12/22 0715) Resp:  [10-16] 14 (12/22 0715) BP: (80-97)/(36-63) 88/36 (12/22 0715) SpO2:  [96 %-100 %] 99 % (12/22 0715) Weight:  [86.2 kg] 86.2 kg (12/21 2240)   Last BM recorded by nurses in past 5 days No data recorded  General:   Pleasant, well developed male in no acute distress Head:  Normocephalic and atraumatic. Eyes: sclerae anicteric,conjunctive pink  Heart:   regular rate and rhythm, no murmurs or gallops Pulm: Clear anteriorly; no wheezing, port left upper chest. Abdomen:  Soft, Non-distended AB, Active bowel sounds. No tenderness, No organomegaly appreciated. Extremities:  Without edema, fistula left arm Msk:  Symmetrical without gross deformities. Peripheral pulses intact.  Neurologic:  Alert and  oriented x4;  No focal deficits.  Skin: Multiple tattoos bilateral arms and chest.  Dry and intact without significant lesions or rashes. Psychiatric:  Cooperative. Normal mood and affect.  LAB RESULTS: Recent Labs    03/16/23 2300 03/17/23 0308 03/17/23 0542  WBC 5.2  --  5.3  HGB 10.1* 8.8* 9.0*  HCT 31.9* 28.1* 28.4*  PLT 280  --  260   BMET Recent Labs    03/16/23 2354 03/17/23 0542  NA 139 138  K 4.2 4.3  CL 99 102  CO2 27 23  GLUCOSE 105* 68*  BUN 33* 36*  CREATININE 11.30* 11.62*  CALCIUM 7.7* 7.7*   LFT Recent Labs    03/17/23 0542  PROT 6.7  ALBUMIN 2.7*  AST 21  ALT 9  ALKPHOS 60  BILITOT 0.8   PT/INR No results for input(s): "LABPROT", "INR" in the last 72 hours.  STUDIES: DG Chest Port 1 View Result Date: 03/17/2023 CLINICAL DATA:  Dialysis access problem.  Weakness. EXAM: PORTABLE CHEST 1 VIEW COMPARISON:  02/16/2022 FINDINGS: Left dialysis catheter is in place with the tip in the right atrium. Heart and mediastinal contours are within normal limits. No focal opacities or effusions. No acute bony abnormality. IMPRESSION: No active disease. Electronically Signed   By: Charlett Nose M.D.   On: 03/17/2023 00:41      Impression/Plan:   37 year old male with end-stage renal disease recent rejected kidney transplant June of this year back on dialysis, last dialysis Friday presents with hypotension and 1 episode of bright red blood per rectum on toilet paper. Had recent EGD April of this year with gastritis, colonoscopy with AVM and internal hemorrhoids status post APC Hgb stable at 9.2, baseline appears to be  between 9-11 Iron 67, TIBC 165, saturation 41, ferritin 706, retake count without robust response Likely this is multifactorial anemia related to CKD, may need EPO with nephrology outpatient Patient is  currently stable on midodrine with hypotension which is also likely multifactorial with end-stage renal disease. No further episodes of bleeding Patient has good support system and has follow-up Monday in [place], discussed options of staying inpatient for evaluation versus going home, patient prefers to go home which I think is reasonable at this time with close follow-up. Discussed ER precautions with the patient, wife will be driving him and his children home. Suggest continuing midodrine outpatient Can consider hydrocortisone suppositories outpatient  Principal Problem:   GI bleed    LOS: 0 days   Thank you for your kind consultation, we will continue to follow.   Doree Albee  03/17/2023, 8:22 AM     [facility] Attending   I saw the patient with Ms. Steffanie Dunn.  I performed the majority of the medical decision making which is high.  I agree with the Advanced Practitioner's note, impression and recommendations with the following additions:  Decreased hemoglobin/anemia and slight rectal bleeding.  A history of suspected AVMs in the rectum on a colonoscopy earlier this year in [place].  He needs repeat colonoscopy we think but I do not think he needs to stay in the hospital for that.  Bleeding could be hemorrhoidal maybe he has a proctitis?.  The prep on his colonoscopy was only fair so it should be repeated with a better prep given the signs and symptoms.  Iva Boop, MD, [facility]  Gastroenterology See Loretha Stapler on call - gastroenterology for best contact person 03/17/2023 9:37 AM

## 2023-03-18 NOTE — Unmapped (Signed)
Formatting of this note is different from the original.  Preadmission phone screen completed with patient. Identity confirmed per two identifiers of full name and date of birth. Patient states correct site and procedure with surgeon's name who is performing the procedure.    Discussed the following instructions with patient:  1: Follow all pre-surgery instructions given by your physician.    2: To reduce the risk of surgical site infection use Dyna-Hex body wash starting two nights before surgery, the night before surgery, and the morning of surgery. After your shower, DO NOT use any lotions, powders, sprays, creams, or make up. DO NOT USE Dyna Hex on face or hair. DO NOT drink the soap. Clean sheets and clothes after each shower. Clean wash cloth and towel with each shower. No pets or furry animals in the bed on the clean sheets during the pre-op time or after surgery, until healing has occurred. Patient also instructed not to shave or clip body hair close to the surgical site prior to surgery. Others sleeping in the same bed should also wear clean bed clothing.    3: Wear clean, comfortable, loose fitting clothing the day of surgery.    4: If you wear nail polish remove the polish from at least one finger of each hand. If you have artificial nails please remove at least one nail from each hand.    5: Remove all jewelry and/or piercings. Do not bring valuables with you on the day of surgery.    6: If you wear hearing aids, glasses, or contact lenses please remember to bring a case to store them in during your surgery. Dentures and partials will need to be removed as well, a denture cup will be provided as needed. Do no use adhesive with dentures on the day of surgery.    7: DON'T eat or drink anything (NPO) after midnight the night before your procedure -- this includes water, ice chips, gum, hard candy, mints, chewing tobacco, and snuff. Also, avoid smoking.    8: [facility] at [facility]  and check in with the receptionist at the desk. Patient informed they need to arrange for someone to drive them home after their procedure and stay with them for the first 24 hours. Patient given instructions on where to arrive am of procedure. Shuttle parking is available on the corner of [address] and [address] from 7:00 am - 5:30 pm.    9:  Visitors - you may have one visitor in the pre-op area prior to your procedure, others will be directed where to wait. Once you have been taken back to surgery, your family/friends will wait in the designated area where we will keep them informed of your progress.    10: Pre-Admission Testing does not have the arrival time for the procedure to provide to you.The Surgical/Procedural Department where your procedure is done or Surgeon's office will advise you of your arrival time prior to the day of surgery. Some offices will advise you of your arrival time at the time of scheduling your procedure, others call the day prior to the procedure. If the arrival time is given by the surgery area they will typically call the afternoon prior to the day of the procedure.    11: A call back telephone number to use for any questions at PAT is [phone].    12: Do not take any vitamins, naprosyn, ibuprofen, motrin, aleve or NSAIDS for at least one week  prior to your procedure. If you need something for pain, take TYLENOL.    13: Take these medications only on the morning of surgery with a small sip of water (unless your surgeon has given you instructions otherwise). Per Dr Marvetta Gibbons    14: Pre-Op testing ordered/completed today includes:   Orders Placed This Encounter   Procedures    Venipuncture    Potassium       Pre Admission screening completed with patient. Patient advised to contact their surgeon's office if they become sick, have bronchitis, pneumonia, an upper respiratory infection, develop a temperature of 101 or greater, develop a skin rash or open wound. Patient given  opportunity for questions and verbalized understanding. Male patients: instructed if on menstrual cycle to wear a pad - no tampon, and she may have to give a urine sample for pregnancy test a.m. of the procedure.  Electronically signed by York Grice, RN at 03/18/2023  8:20 AM EST

## 2023-03-19 NOTE — ED Notes (Signed)
Formatting of this note might be different from the original.  Report given to Selena Batten, RN  Electronically signed by Velvet Bathe, RN at 03/19/2023  7:56 PM EST

## 2023-03-19 NOTE — Unmapped (Signed)
Formatting of this note might be different from the original.    Problem: Discharge Planning  Goal: Knowledge of discharge instructions  Description: Interventions:  1. Consult social work for post discharge needs  2. Education, activity restrictions  3. Education, post discharge follow up  4. Education, prescribed medication  5. Education, when to call provider  6. Education, discharge diet  Outcome: Ongoing    Problem: Activity Intolerance  Goal: Improved activity tolerance  Description: Interventions:  1. Education, prescribed activity level  2. Progressive ambulation program  Outcome: Ongoing    Problem: Fluid Volume, Imbalanced, Risk for  Goal: Balanced intake and output  Description: Interventions:  1. Body weight monitoring  2. Intake and output measurments  Outcome: Ongoing    Problem: Infection, Risk for  Goal: Patient will be free from infection  Description: Interventions:  1. Monitor for signs and symptoms of infection: fever, increased WBC, burning with urination or chills  2. Monitor insertion site for redness, tenderness, or drainage  3. Hand hygiene per CDC guidelines  4. Discontinuation of invasive devices when not needed  5. Place patient in appropriate isolation  Outcome: Ongoing    Problem: Nutrition Deficit  Goal: Adequate nutritional intake  Description: Interventions:  -Consult to dietician  -Intake and output measurement  -Calorie count if indicated  Outcome: Ongoing  Goal: Body weight within specified parameters  Description: Interventions:  - Body weight measurement  Outcome: Ongoing    Problem: Pain, Acute  Goal: Communication of presence of pain  Description: Interventions:  - Nonpharmacologic pain management  - Medication administration  - Education, pain scale  Outcome: Ongoing    Problem: Skin Integrity, Impaired, Risk for  Goal: Absence of pressure injury  Description: Interventions:  - Skin assessment  - Patient repositioning every 2 hours if decreased mobility  Outcome:  Ongoing    Electronically signed by Ruthe Mannan, RN at 03/19/2023 10:45 PM EST

## 2023-03-19 NOTE — H&P (Signed)
Formatting of this note is different from the original.    Hospital Medicine Admission History & Physical    Patient: Roberto Rice  DOB: 1985/11/02  MRN: [id number]  Date: 03/19/2023  Room: 39/39  PCP: Domenic Polite, APRN (General)    Chief Complaint:  rectal bleed     History Of Present Illness:  Roberto Rice is a 37 y.o. male with renal transplant and currently on HD who came in due tom recurrent blood in the stools. He is not on anticoagulant. It occurred while he was in [place] a couple of weeks ago. It recurred again. Hgb 11.4. He has been admitted for GI evaluation.    Past Medical History:  Patient  has a past medical history of Blood transfusion, without reported diagnosis, Clostridium difficile colitis (10/12/2022), Epigastric pain, Fistula, arteriovenous, acquired (CMS/HCC), Hemodialysis, Hepatitis C antibody positive in blood (11/08/2022), Hyperlipidemia (01/08/2023), Hypertension (01/08/2023), Kidney disease (approx 4 weeks), Kidney transplant rejection, Limb alert care status, Nausea and vomiting, unspecified vomiting type, and S/P dialysis catheter insertion (CMS/HCC).    Past Surgical History:  Patient's  has a past surgical history that includes Hx Broken/Fx Bones; Hx Other Surgical History; AV Fistulogram (N/A, 10/04/2011); Embolization (Right, 10/04/2011); Venous PTA (Right, 10/04/2011); Hx Arterio-Venous Fistula (Right, 08/16/2011); Hx Ultrasound, Intraoperative (Right, 08/16/2011); Tunnel catheter placement (N/A, 08/02/2011); Hx Colonoscopy (N/A, 07/01/2022); and Hx EGJ (N/A, 12/03/2022).    Family History:   Patient's family history includes Hypertension in his father and mother.       Social History:  He  reports no history of alcohol use.  He  reports current drug use. Frequency: 2.00 times per week. Drug: Marijuana.  He  reports that he has never smoked. He has never used smokeless tobacco.     Allergies:  Nsaids (non-steroidal anti-inflammatory drug)    PTA Medications:    Prior to Admission  Medications   Prescriptions Last Dose Informant Patient Reported? Taking?   NIFEdipine 60 mg ER tablet   Yes No   Sig: Take 60 mg by mouth Twice a day.   atovaquone (MEPRON) 750 mg/5 mL suspension   No No   Sig: Take 10 mL by mouth once daily with breakfast. for 30 days. SHAKE GENTLEY BEFORE USE. DO NOT FREEZE! MUST BE TAKEN WITH FOOD!   carvediloL (COREG) 12.5 mg tablet   No No   Sig: Take 1 Tablet by mouth Twice a day for 90 days.   cholecalciferol, vitamin D3, (VITAMIN D3) Tab tablet   Yes No   Sig: Take 2,000 Units by mouth Every morning.   ferric citrate (AURYXIA) 210 mg iron Tab   No No   Sig: Take 2 Tabs by mouth Three times a day for 90 days.   lidocaine HCL 2.8 % Gel   No No   Sig: 1 Application by Apply externally route Twice a day.   mycophenolate sodium (MYFORTIC) 180 mg DR tablet   No No   Sig: Take 2 Tabs by mouth Twice a day for 90 days.   mycophenolate sodium (MYFORTIC) 180 mg DR tablet   Yes No   Sig: Take 180 mg by mouth As directed. Take 2 capsules in the morning and 1 capsule in the afternoon   predniSONE (DELTASONE) 5 mg tablet   Yes No   Sig: Take 5 mg by mouth Once Daily.   sulfamethoxazole-trimethoprim (BACTRIM) 400-80 mg per tablet   Yes No   Sig: Take 1 Tablet by mouth Monday, Wednesday and Friday.  tacrolimus (PROGRAF) 1 mg   Yes No   Sig: Take 6 mg by mouth Twice a day.   valGANciclovir (VALCYTE) 50 mg/mL SolR   No No   Sig: Take 2 mL by mouth Monday, Wednesday and Friday for 90 days.     Facility-Administered Medications: None     Review of Systems:   Review of Systems   Constitutional: Negative.    HENT: Negative.     Eyes: Negative.    Respiratory: Negative.     Cardiovascular: Negative.    Gastrointestinal:  Positive for blood in stool.     Physical Exam:  Blood pressure 109/69, pulse 89, temperature 98.7 F (37.1 C), temperature source Oral, resp. rate 16, height 5' 11 (180.3 cm), weight 83.3 kg (183 lb 11.2 oz), SpO2 97%.  Physical Exam  Vitals reviewed.   Constitutional:        General: He is not in acute distress.     Appearance: He is well-developed.   Eyes:      General: No scleral icterus.  Cardiovascular:      Heart sounds: No murmur heard.  Pulmonary:      Effort: No respiratory distress.   Abdominal:      General: Bowel sounds are normal. There is no distension.      Palpations: Abdomen is soft.      Tenderness: There is no abdominal tenderness.   Musculoskeletal:         General: No tenderness.   Skin:     General: Skin is warm and dry.      Findings: No rash.   Neurological:      Cranial Nerves: No cranial nerve deficit.     Labs:    CBC:   Lab Results   Component Value Date/Time    WBC 4.3 03/19/2023 1755    RBC 3.91 03/19/2023 1755    HGB 11.4 03/19/2023 1755    HCT 37.1 03/19/2023 1755    MCV 94.9 03/19/2023 1755    MCH 29.3 03/19/2023 1755    MCHC 30.8 03/19/2023 1755    RDW 17.2 03/19/2023 1755    MPV 7.5 03/19/2023 1755    PLATELETCNT 295 03/19/2023 1755     Coagulation:   Lab Results   Component Value Date/Time    APTT 34.5 03/19/2023 1755    PROTIME 12.7 03/19/2023 1755    INR 1.1 03/19/2023 1755     CMP:   Lab Results   Component Value Date/Time    SODIUM 136 03/19/2023 1755    POTASSIUM 3.6 03/19/2023 1755    CHLORIDE 95 03/19/2023 1755    CO2 32 03/19/2023 1755    GLUCOSE 66 03/19/2023 1755    BUN 20 03/19/2023 1755    CREATININE 7.0 03/19/2023 1755    CALCIUM 8.7 03/19/2023 1755    PROTEINTOTAL 8.8 03/19/2023 1755    TBILIRUBIN 0.6 03/19/2023 1755    ALP 82 03/19/2023 1755    AST 15 03/19/2023 1755    ALTSGPT 7 03/19/2023 1755    ALBUMIN 4.0 03/19/2023 1755    AGRATIO 0.8 03/19/2023 1755     Imaging:   Reviewed    ASSESSMENT/PLAN:     Problems and Relevant Co-morbid conditions::  Principal Problem:    Rectal bleed  Active Problems:    Renal transplant, status post    ESRD (end stage renal disease) on dialysis (CMS/HCC)    * Rectal bleed  GI involved. Monitor hgb. Renal diet. Will continue appropriate regular medications.  ESRD (end stage renal disease) on dialysis  (CMS/HCC)  Nephrology on call to continue HD.        DVT ppx - SCD    ---  Signed,  Margaretha Sheffield, MD  03/19/2023          Electronically signed by Margaretha Sheffield, MD at 03/19/2023  8:54 PM EST

## 2023-03-19 NOTE — Unmapped (Signed)
Formatting of this note might be different from the original.  Flonnie Overman    Test Name: creat  Results:  7  03/19/2023  Time: 1844  Received from: lab  R/V by: Florentina Addison    (Required: Attempt notification within 30 minutes or document reason for no notification)    Physician: af  03/19/2023  Time: 1844  Notified: Yes - No orders received  Electronically signed by Adline Peals, RN at 03/19/2023  6:44 PM EST

## 2023-03-19 NOTE — ED Notes (Signed)
Formatting of this note might be different from the original.  Called report to 2D. Room ready at this time.  Electronically signed by Nicoletta Dress, RN at 03/19/2023  9:36 PM EST

## 2023-03-19 NOTE — ED Provider Notes (Signed)
Formatting of this note is different from the original.  Ayush, Dinelli [[id number]] [facility]) - 37 y.o.  Note Creation:03/20/2023  Encounter Date:03/19/2023      History     Chief Complaint   Patient presents with    Rectal Bleeding     Adien Bacho is a 37 y.o. male with hx of HLD, HTN, kidney disease who presents to the ED with c/o rectal bleeding that recurred this morning.  Patient notes he has had three episodes of maroon stools today. Pt also generalized weakness and fatigue. He says he just wants to lay around instead of being his normal energetic self and playing with his children.  Pt reports was hospitalized in [place], [place] last week for bloody stools, though his blood stools seems to improve while he was hospitalized and he did not have an EGD or colonoscopy. He denies CP, SOB, HA, fever, myalgia, dysuria or any other pertinent symptoms or concerns.     Does have a history of EGD in September of this year- notable for gastritis and duodenopathy.    The history is provided by the patient and medical records.     Past Medical History:   Diagnosis Date    Blood transfusion, without reported diagnosis     Clostridium difficile colitis 10/12/2022    Epigastric pain     Fistula, arteriovenous, acquired (CMS/HCC)     has old fistula right arm, may be used for secondary fistula creation. 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 May be used for secondary fistula creation)    Nausea and vomiting, unspecified vomiting type     S/P dialysis catheter insertion (CMS/HCC)     chest     Past Surgical History:   Procedure Laterality Date    AV FISTULOGRAM N/A 10/04/2011    AV FISTULOGRAM  performed by Audelia Acton, MD at [facility] VASCULAR LABS    EMBOLIZATION Right 10/04/2011    EMBOLIZATION performed by Audelia Acton, MD at [facility] VASCULAR LABS    HX ARTERIO-VENOUS FISTULA Right 08/16/2011    ARTERIO-VENOUS FISTULA performed by Audelia Acton, MD at [facility] CVOR    HX BROKEN/FX BONES      lt ankle ten pins and a plate    HX COLONOSCOPY N/A 07/01/2022    COLONOSCOPY /C ARGON PLASMA COAGULATION performed by Princess Bruins, MD at [facility] MAIN OR    HX EGJ N/A 12/03/2022    EGD /C BIOPSY performed by Josetta Huddle, MD at [facility] ENDO    HX OTHER SURGICAL HISTORY      tunnel cath 06/2011 [facility]    HX ULTRASOUND, INTRAOPERATIVE Right 08/16/2011    ULTRASOUND, INTRAOPERATIVE performed by Audelia Acton, MD at [facility] CVOR    TUNNEL CATH PLACEMENT N/A 08/02/2011    TUNNEL CATH PLACEMENT performed by Audelia Acton, MD at [facility] VASCULAR LABS    VENOUS PTA Right 10/04/2011    VENOUS PTA performed by Audelia Acton, MD at [facility] VASCULAR LABS     Family History   Problem Relation Name Age of Onset  Hypertension Mother      Hypertension Father       Social History     Tobacco Use    Smoking status: Never    Smokeless tobacco: Never   Vaping Use    Vaping status: Never Used   Substance Use Topics    Alcohol use: No    Drug use: Yes     Frequency: 2.0 times per week     Types: Marijuana     Patient is not a tobacco user.    No LMP for male patient.    Allergies   Allergen Reactions    Nsaids (Non-Steroidal Anti-Inflammatory Drug) Other (See Comments)     CANNOT TAKE DUE TO KIDNEY FAILURE   CANNOT TAKE DUE TO KIDNEY FAILURE       Current Outpatient Medications on File Prior to Encounter   Medication Sig    sucroferric oxyhydroxide (VELPHORO) 500 mg Chew Take 500 mg by mouth Three times a day. With meals    lidocaine HCL 2.8 % Gel 1 Application by Apply externally route Twice a day.    sulfamethoxazole-trimethoprim (BACTRIM) 400-80 mg per tablet Take 1 Tablet by mouth Monday, Wednesday and Friday.    predniSONE (DELTASONE) 5 mg tablet Take 5 mg  by mouth Once Daily.    mycophenolate sodium (MYFORTIC) 180 mg DR tablet Take 180 mg by mouth As directed. Take 2 capsules in the morning and 1 capsule in the afternoon    [EXPIRED] ferric citrate (AURYXIA) 210 mg iron Tab Take 2 Tabs by mouth Three times a day for 90 days.    [EXPIRED] carvediloL (COREG) 12.5 mg tablet Take 1 Tablet by mouth Twice a day for 90 days.    atovaquone (MEPRON) 750 mg/5 mL suspension Take 10 mL by mouth once daily with breakfast. for 30 days. SHAKE GENTLEY BEFORE USE. DO NOT FREEZE! MUST BE TAKEN WITH FOOD!    tacrolimus (PROGRAF) 1 mg Take 6 mg by mouth Twice a day.    valGANciclovir (VALCYTE) 50 mg/mL SolR Take 2 mL by mouth Monday, Wednesday and Friday for 90 days.    mycophenolate sodium (MYFORTIC) 180 mg DR tablet Take 2 Tabs by mouth Twice a day for 90 days.    cholecalciferol, vitamin D3, (VITAMIN D3) Tab tablet Take 2,000 Units by mouth Every morning.    NIFEdipine 60 mg ER tablet Take 60 mg by mouth Twice a day.       Review of Systems     Review of Systems   Constitutional:  Positive for fatigue. Negative for chills and fever.   Respiratory:  Negative for shortness of breath.    Cardiovascular:  Negative for chest pain.   Gastrointestinal:  Positive for anal bleeding, blood in stool and diarrhea (chronic). Negative for nausea and vomiting.   Musculoskeletal:  Negative for myalgias.     Physical Exam     ED Triage Vitals   BP BP Manual or Automatic? Patient Position BP Location Heart Rate (Monitor)   03/19/23 1651 03/19/23 1651 03/19/23 1651 03/19/23 1651 --   109/67 Automatic Sitting Right Arm      Pulse Pulse Source Respirations Temp Temp Source   03/19/23 1651 -- 03/19/23 1651 03/19/23 1651 03/19/23 1651   100  16 98.7 F (37.1 C) Oral     SpO2 SPO2 Location O2 Delivery O2 Device O2 Flow Rate (l/min)   03/19/23 1651 03/19/23 1651 03/19/23 1651 03/19/23 2236 03/19/23 2236   100 % Right Arm Room air  None (Room air) 0 l/min     FIO2 (%) Pain Intensity 1 Exacerbated By Relieved  By Quality   -- 03/19/23 1651 -- -- --    6        Duration       --           Physical Exam  HENT:      Head: Normocephalic and atraumatic.      Mouth/Throat:      Mouth: Mucous membranes are moist.      Pharynx: Oropharynx is clear.   Eyes:      General: No scleral icterus.     Conjunctiva/sclera: Conjunctivae normal.   Cardiovascular:      Rate and Rhythm: Normal rate and regular rhythm.      Pulses:           Radial pulses are 2+ on the right side and 2+ on the left side.   Pulmonary:      Effort: Pulmonary effort is normal. No respiratory distress.      Breath sounds: Normal breath sounds.   Chest:      Comments: Dialysis catheter present to chest.  Abdominal:      General: Abdomen is flat. There is no distension.      Palpations: Abdomen is soft.      Tenderness: There is no abdominal tenderness. There is no guarding.   Genitourinary:     Rectum: Guaiac result positive.   Musculoskeletal:         General: No swelling. Normal range of motion.      Cervical back: Normal range of motion and neck supple. No tenderness.      Comments: Dialysis fistula present in L forearm.   Skin:     General: Skin is warm and dry.      Capillary Refill: Capillary refill takes less than 2 seconds.   Neurological:      General: No focal deficit present.      Mental Status: He is alert and oriented to person, place, and time.   Psychiatric:         Mood and Affect: Mood normal.         Behavior: Behavior normal.     MDM         Treatment:  Procedures    Medications   carvediloL (COREG) tab 12.5 mg (12.5 mg Oral Refused 03/19/23 2100)   predniSONE (DELTASONE) tab 5 mg (has no administration in time range)   -PHARMACY CONSULT- MED REC/PT COUNSELING 1 Each (has no administration in time range)   pantoprazole (PROTONIX) injection 40 mg (40 mg Intravenous Given 03/19/23 2010)     Results for orders placed or performed during the hospital encounter of 03/19/23   CBC w/Differential   Result Value Ref Range    WBC 4.3 (L) 4.5 - 11.0 10*3/uL     RBC 3.91 (L) 4.50 - 5.90 10*6/uL    HGB 11.4 (L) 13.5 - 17.5 g/dL    HCT 47.8 29.5 - 62.1 %    MCV 94.9 80.0 - 100.0 fL    MCHC 30.8 (L) 32.0 - 36.0 g/dL    MCH 30.8 65.7 - 84.6 pg    RDW 17.2 10.7 - 18.7 %    MPV 7.5 6.5 - 10.0 fL    Platelet Cnt 295 150 - 450 10*3/uL    Differential Type Auto     Neutrophils 65.7 35.0 - 66.0 %    Lymphocytes 17.4 (L) 24.0 -  44.0 %    Monocytes 9.0 2.1 - 13.3 %    Eosinophils 7.7 (H) 0.3 - 5.0 %    Basophils 0.2 0.0 - 1.0 %    Neutrophils Abs 2.8 1.5 - 8.5 10*3/uL    Lymphocytes Abs 0.8 (L) 1.1 - 5.0 10*3/uL    Monocytes Abs 0.4 0.0 - 1.4 10*3/uL    Eosinophils Abs 0.3 0.0 - 0.5 10*3/uL    Basophils Abs 0.0 0.0 - 0.1 10*3/uL   Comprehensive Metabolic Panel   Result Value Ref Range    SODIUM 136 135 - 145 mmol/L    POTASSIUM 3.6 3.6 - 5.0 mmol/L    CHLORIDE 95 (L) 101 - 111 mmol/L    CO2 32 (H) 21 - 31 mmol/L    ANION GAP 9     GLUCOSE 66 (L) 70 - 110 mg/dL    CREATININE 7.0 (HH) 0.6 - 1.2 mg/dL    BUN 20 2 - 32 mg/dL    CALCIUM 8.7 8.5 - 29.5 mg/dL    PROTEIN TOTAL 8.8 (H) 6.1 - 7.8 g/dL    Albumin 4.0 3.2 - 5.0 g/dL    T BILIRUBIN 0.6 0.2 - 1.0 mg/dL    ALP 82 42 - 621 [iU]/L    AST 15 10 - 42 [iU]/L    ALT (SGPT) 7 (L) 10 - 60 [iU]/L    OSMOLALITY 273 266 - 309    A/G Ratio 0.8     B/C 3 (L) 10 - 20    ESTIMATED GFR 9 mL/min   PT/APTT/INR   Result Value Ref Range    PROTIME 12.7 10.1 - 13.7 s    INR 1.1 0.9 - 1.1    APTT 34.5 25.5 - 35.9 s   Lactic Acid, Venous   Result Value Ref Range    LACTIC ACID 1.6 0.5 - 1.9 mmol/L   Fingerstick Glucose   Result Value Ref Range    GLUCOSE FINGERSTICK 88 70 - 110 mg/dL   Fingerstick Glucose   Result Value Ref Range    GLUCOSE FINGERSTICK 89 70 - 110 mg/dL   CBC w/Differential   Result Value Ref Range    WBC 4.7 4.5 - 11.0 10*3/uL    RBC 3.50 (L) 4.50 - 5.90 10*6/uL    HGB 10.6 (L) 13.5 - 17.5 g/dL    HCT 30.8 (L) 65.7 - 53.0 %    MCV 91.2 80.0 - 100.0 fL    MCHC 33.2 32.0 - 36.0 g/dL    MCH 84.6 96.2 - 95.2 pg    RDW 17.6 10.7 - 18.7 %     MPV 7.9 6.5 - 10.0 fL    Platelet Cnt 285 150 - 450 10*3/uL    Differential Type Auto     Neutrophils 62.0 35.0 - 66.0 %    Lymphocytes 16.7 (L) 24.0 - 44.0 %    Monocytes 11.6 2.1 - 13.3 %    Eosinophils 8.7 (H) 0.3 - 5.0 %    Basophils 1.0 0.0 - 1.0 %    Neutrophils Abs 2.9 1.5 - 8.5 10*3/uL    Lymphocytes Abs 0.8 (L) 1.1 - 5.0 10*3/uL    Monocytes Abs 0.5 0.0 - 1.4 10*3/uL    Eosinophils Abs 0.4 0.0 - 0.5 10*3/uL    Basophils Abs 0.0 0.0 - 0.1 10*3/uL   Comprehensive Metabolic Panel   Result Value Ref Range    SODIUM 136 135 - 145 mmol/L    POTASSIUM 3.8 3.6 - 5.0 mmol/L  CHLORIDE 97 (L) 101 - 111 mmol/L    CO2 30 21 - 31 mmol/L    ANION GAP 9     GLUCOSE 113 (H) 70 - 110 mg/dL    CREATININE 7.4 (HH) 0.6 - 1.2 mg/dL    BUN 24 2 - 32 mg/dL    CALCIUM 8.6 8.5 - 78.2 mg/dL    PROTEIN TOTAL 8.0 (H) 6.1 - 7.8 g/dL    Albumin 3.6 3.2 - 5.0 g/dL    T BILIRUBIN 0.5 0.2 - 1.0 mg/dL    ALP 70 42 - 956 [iU]/L    AST 15 10 - 42 [iU]/L    ALT (SGPT) 7 (L) 10 - 60 [iU]/L    OSMOLALITY 277 266 - 309    A/G Ratio 0.8     B/C 3 (L) 10 - 20    ESTIMATED GFR 8 mL/min   Fingerstick Glucose   Result Value Ref Range    GLUCOSE FINGERSTICK 90 70 - 110 mg/dL       Results    None      Consult:  2005: I spoke with Dr. Morey Hummingbird, hospitalist, about the pt's history of present illness, physical examination and course in the ED. Dr. Morey Hummingbird has agreed to admit the patient.    Plan:    [facility] ED RECHECK: Admit: The pt is awake and alert at time of reevaluation. I spoke with the patient about his ED work up and diagnosis. I informed the patient that he will be admitted for further evaluation/treatment. All questions answered. The pt is agreeable.     Medical Decision Making  DIFFERENTIAL DIAGNOSIS  Differential Diagnosis:  The following diagnoses were considered in the evaluation of this patient: anemia, lower GI-bleed, upper GI bleeding, gastritis/duodenitis, ulcer disease.    Hemoglobin is stable on lab evaluation -- 11.4.  Given his history of ESRD,  he is higher risk for bleeding.  Of note, while in the ED, he had two additional episodes of bloody stools.     Reviewed his EGD from September of this year - did have gastritis and duodenopathy.  Also reviewed his recent hospitalization at [facility] in [place], [place] - was discharged on 12/21.  Per their documentation, he did have a decrease in his hgb while admitted, though he had improvement of his bleeding and further GI evaluation deferred as the patient preferred to follow-up in [place] (his home).      Given recurrence of bleeding, warrants observation for consideration of further GI evaluation and monitoring of hemoglobin.      Amount and/or Complexity of Data Reviewed  Labs: ordered.    Risk  Prescription drug management.  Decision regarding hospitalization.    ED Prescriptions    None      Final diagnoses:   Melena   ESRD (end stage renal disease) (CMS/HCC)   History of gastritis       ED Disposition       ED Disposition   Admitted    Condition   --    Comment   Bed Reason: Medical Necessity [2]            Scribe Attestation:  Lysle Dingwall acting as a Neurosurgeon for and in the presence of [name], MD  Electronically signed by Lysle Dingwall 03/19/23 4:56 PM     Provider Attestation:  Estill Batten, MD, attest that scribe listed above was acting in a scribe capacity, has observed my performance of the services and has documented  them in accordance with my direction.  I personally performed the services described in the documentation, review the documentation record above-described presents and accurately and completely reports my words and actions.    Electronically signed by [name], MD 03/20/2023 9:37 AM    Electronically signed by [name], MD at 03/20/2023  9:37 AM EST

## 2023-03-19 NOTE — Assessment & Plan Note (Signed)
Associated Problem(s): Rectal bleed  Formatting of this note might be different from the original.  GI involved. Monitor hgb. Renal diet. Will continue appropriate regular medications.  Electronically signed by Margaretha Sheffield, MD at 03/19/2023  8:51 PM EST

## 2023-03-19 NOTE — ED Notes (Signed)
Formatting of this note might be different from the original.  Pt given shasta to drink at present.    Electronically signed by Velvet Bathe, RN at 03/19/2023  7:03 PM EST

## 2023-03-19 NOTE — Assessment & Plan Note (Signed)
Associated Problem(s): ESRD (end stage renal disease) on dialysis (CMS/HCC)  Formatting of this note might be different from the original.  Nephrology on call to continue HD.  Electronically signed by Margaretha Sheffield, MD at 03/19/2023  8:51 PM EST

## 2023-03-20 NOTE — Consults (Signed)
Associated Order(s): IP CONSULT TO GASTROENTEROLOGY  Formatting of this note is different from the original.  GASTROENTEROLOGY CONSULT NOTE    Roberto Rice    03/20/2023    Patient Name:  Roberto Rice      DOB:  December 16, 1985      MEDICAL RECORD NUMBER [id number]    REQUESTING PHYSICIAN  Hope Budds, DO    PRIMARY CARE PHYSICIAN  Caudill, Timothy, APRN    REASON FOR CONSULTATION  Roberto Rice is a 37 y.o. male who was referred for consultation for rectal bleeding.    Patient being seen in coverage, while on Call for Dr Roberto Rice.     HPI:   This is a 37 y.o. male with a history as below who presented to the ER yesterday for reports of maroon colored stool yesterday on 3 different episodes with further rectal bleeding a week ago with hospitalization in [place].   Recent endoscopic evaluation.     Patient reporting last episode of blood noted this morning. Lesser in amount. Symptoms improving per patient report. Denies shortness of breath and chest pain. Denies N/V.     EGD 12/03/22  Impression:            - Normal esophagus.                          - Z-line regular, 45 cm from the incisors.                          - Gastritis. Biopsied.                          - Erythematous duodenopathy.     MEDICAL HISTORY  Past Medical History:   Diagnosis Date    Blood transfusion, without reported diagnosis     Clostridium difficile colitis 10/12/2022    Epigastric pain     Fistula, arteriovenous, acquired (CMS/HCC)     has old fistula right arm, may be used for secondary fistula creation. 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 May be used for secondary fistula creation)    Nausea and vomiting,  unspecified vomiting type     S/P dialysis catheter insertion (CMS/HCC)     chest     SURGICAL HISTORY  Past Surgical History:   Procedure Laterality Date    AV FISTULOGRAM N/A 10/04/2011    AV FISTULOGRAM performed by Audelia Acton, MD at [facility]    EMBOLIZATION Right 10/04/2011    EMBOLIZATION performed by Audelia Acton, MD at [facility] VASCULAR LABS    HX ARTERIO-VENOUS FISTULA Right 08/16/2011    ARTERIO-VENOUS FISTULA performed by Audelia Acton, MD at [facility] CVOR    HX BROKEN/FX BONES      lt ankle ten pins and a plate    HX COLONOSCOPY N/A 07/01/2022    COLONOSCOPY /C ARGON PLASMA COAGULATION performed by Princess Bruins, MD at [facility] MAIN OR    HX  EGJ N/A 12/03/2022    EGD /C BIOPSY performed by Josetta Huddle, MD at [facility] ENDO    HX OTHER SURGICAL HISTORY      tunnel cath 06/2011 [facility]    HX ULTRASOUND, INTRAOPERATIVE Right 08/16/2011    ULTRASOUND, INTRAOPERATIVE performed by Audelia Acton, MD at [facility] CVOR    TUNNEL CATH PLACEMENT N/A 08/02/2011    TUNNEL CATH PLACEMENT performed by Audelia Acton, MD at [facility] VASCULAR LABS    VENOUS PTA Right 10/04/2011    VENOUS PTA performed by Audelia Acton, MD at [facility] VASCULAR LABS     MEDICATIONS  Medications Prior to Admission   Medication Sig Dispense Refill Last Dose    sucroferric oxyhydroxide (VELPHORO) 500 mg Chew Take 500 mg by mouth Three times a day. With meals   03/19/2023    lidocaine HCL 2.8 % Gel 1 Application by Apply externally route Twice a day. 100 g 1 Past Week    sulfamethoxazole-trimethoprim (BACTRIM) 400-80 mg per tablet Take 1 Tablet by mouth Monday, Wednesday and Friday.   Unknown    predniSONE (DELTASONE) 5 mg tablet Take 5 mg by mouth Once Daily.   Unknown    mycophenolate sodium (MYFORTIC) 180 mg DR tablet Take 180 mg by mouth As directed. Take 2 capsules in the morning and 1 capsule in the afternoon   Unknown    [EXPIRED] ferric citrate (AURYXIA) 210 mg iron Tab Take 2 Tabs by mouth Three times a day for 90 days. 540 Tablet 0     [EXPIRED] carvediloL  (COREG) 12.5 mg tablet Take 1 Tablet by mouth Twice a day for 90 days. 180 Tablet 0     atovaquone (MEPRON) 750 mg/5 mL suspension Take 10 mL by mouth once daily with breakfast. for 30 days. SHAKE GENTLEY BEFORE USE. DO NOT FREEZE! MUST BE TAKEN WITH FOOD! 210 mL 1     tacrolimus (PROGRAF) 1 mg Take 6 mg by mouth Twice a day.   Unknown    valGANciclovir (VALCYTE) 50 mg/mL SolR Take 2 mL by mouth Monday, Wednesday and Friday for 90 days. 88 mL 0     mycophenolate sodium (MYFORTIC) 180 mg DR tablet Take 2 Tabs by mouth Twice a day for 90 days. 360 Tablet 0     cholecalciferol, vitamin D3, (VITAMIN D3) Tab tablet Take 2,000 Units by mouth Every morning.   Unknown    NIFEdipine 60 mg ER tablet Take 60 mg by mouth Twice a day.   Unknown     Current Facility-Administered Medications   Medication Dose Route Frequency Provider Last Rate Last Admin    pantoprazole (PROTONIX) injection 40 mg  40 mg Intravenous Q12H Rice, Kylie, DO        Followed by    Melene Muller ON 03/23/2023] pantoprazole (PROTONIX) DR tab 40 mg  40 mg Oral Q12H Rice, Kylie, DO        [Held by provider] carvediloL (COREG) tab 12.5 mg  12.5 mg Oral BID Margaretha Sheffield, MD        predniSONE (DELTASONE) tab 5 mg  5 mg Oral DAILY Thu, Gamani, MD        -PHARMACY CONSULT- MED REC/PT COUNSELING 1 Each  1 Each Does not apply CONSULT Hilley, Leonard, PHARMD        tacrolimus (PROGRAF) cap 6 mg  6 mg Oral BID Margaretha Sheffield, MD         ALLERGIES  Allergies   Allergen Reactions    Nsaids (Non-Steroidal Anti-Inflammatory Drug)  Other (See Comments)     CANNOT TAKE DUE TO KIDNEY FAILURE   CANNOT TAKE DUE TO KIDNEY FAILURE       SOCIAL HISTORY  Social History     Socioeconomic History    Marital status: Married   Tobacco Use    Smoking status: Never    Smokeless tobacco: Never   Vaping Use    Vaping status: Never Used   Substance and Sexual Activity    Alcohol use: No    Drug use: Yes     Frequency: 2.0 times per week     Types: Marijuana     Social Determinants of Health     Food  Insecurity: No Food Insecurity (03/19/2023)    Hunger Vital Sign     Worried About Running Out of Food in the Last Year: Never true     Ran Out of Food in the Last Year: Never true   Transportation Needs: No Transportation Needs (03/19/2023)    PRAPARE - Therapist, art (Medical): No     Lack of Transportation (Non-Medical): No   Intimate Partner Violence: Not At Risk (03/19/2023)    Humiliation, Afraid, Rape, and Kick questionnaire     Fear of Current or Ex-Partner: No     Emotionally Abused: No     Physically Abused: No     Sexually Abused: No   Housing Stability: Low Risk  (03/19/2023)    Housing Stability Vital Sign     Unable to Pay for Housing in the Last Year: No     Number of Times Moved in the Last Year: 0     Homeless in the Last Year: No     FAMILY HISTORY  Family History   Problem Relation Name Age of Onset    Hypertension Mother      Hypertension Father       REVIEW OF SYSTEMS    Review of Systems   Constitutional: Negative.    HENT: Negative.     Respiratory: Negative.     Cardiovascular: Negative.  Negative for chest pain.   Gastrointestinal:  Positive for blood in stool. Negative for abdominal pain.     PHYSICAL EXAM  Vital Signs:   Recorded Vitals    03/20/23 0801 03/20/23 0815 03/20/23 1130 03/20/23 1256   BP: (!) 84/49 110/68 (!) 92/60 118/68   Pulse: 98  84    Resp: 14  14    Temp: 97.8 F (36.6 C)  97.9 F (36.6 C)    TempSrc: Oral  Oral    SpO2: 98%  99%       Body mass index is 25.27 kg/m.    Physical Exam  Vitals and nursing note reviewed.   Constitutional:       Appearance: Normal appearance. He is normal weight. He is not ill-appearing.   HENT:      Head: Normocephalic.      Nose: Nose normal.      Mouth/Throat:      Mouth: Mucous membranes are moist.   Cardiovascular:      Rate and Rhythm: Normal rate and regular rhythm.   Pulmonary:      Effort: Pulmonary effort is normal.      Breath sounds: Normal breath sounds.   Abdominal:      General: Abdomen is flat.  Bowel sounds are normal. There is no distension.      Palpations: Abdomen is soft.   Musculoskeletal:  General: Normal range of motion.      Cervical back: Normal range of motion.   Skin:     General: Skin is warm and dry.   Neurological:      General: No focal deficit present.      Mental Status: He is alert and oriented to person, place, and time. Mental status is at baseline.   Psychiatric:         Mood and Affect: Mood normal.         Behavior: Behavior normal.         Thought Content: Thought content normal.         Judgment: Judgment normal.       INTAKE & OUTPUT    Intake/Output Summary (Last 24 hours) at 03/20/2023 1327  Last data filed at 03/20/2023 1015  Gross per 24 hour   Intake 120 ml   Output --   Net 120 ml     LABS    Lab Results   Component Value Date/Time    WBC 4.7 03/20/2023 0100    HGB 10.6 03/20/2023 0100    HCT 31.9 03/20/2023 0100    MCV 91.2 03/20/2023 0100    MCHC 33.2 03/20/2023 0100    PLATELETCNT 285 03/20/2023 0100     CMP:   Lab Results   Component Value Date/Time    SODIUM 136 03/20/2023 0100    POTASSIUM 3.8 03/20/2023 0100    CHLORIDE 97 03/20/2023 0100    CO2 30 03/20/2023 0100    GLUCOSE 113 03/20/2023 0100    BUN 24 03/20/2023 0100    CREATININE 7.4 03/20/2023 0100    CALCIUM 8.6 03/20/2023 0100    PROTEINTOTAL 8.0 03/20/2023 0100    TBILIRUBIN 0.5 03/20/2023 0100    ALP 70 03/20/2023 0100    AST 15 03/20/2023 0100    ALTSGPT 7 03/20/2023 0100    ALBUMIN 3.6 03/20/2023 0100    AGRATIO 0.8 03/20/2023 0100     Lab Results   Component Value Date    INR 1.1 03/19/2023    INR 1.0 09/29/2022    INR 1.0 06/30/2022    PROTIME 12.7 03/19/2023    PROTIME 12.2 09/29/2022    PROTIME 12.0 06/30/2022     No results found for: AMYLASE    No results found for: LIPASE    REPORTS OF IMAGING & OTHER STUDIES    ASSESSMENT AND PLAN  Patient discussed with Dr. Maryjean Morn. Per Dr. Maryjean Morn orders as follows.    1. Rectal Bleeding, improved per patient report.   -Monitor Hgb.   -Transfuse as  indicated to maintain Hgb >7, deferred to medicine.   -Supportive care.   -Further treatment pending clinical course.   -Follow up with Dr Roberto Rice.     See orders.   Further management pending hospital course.   Patient and/or family was updated on diagnoses, procedures, tests, results, and plan of care.    Thank you for consulting.    Electronically signed by:   Roberto Hacking, APRN  03/20/2023   1:27 PM     I have seen and examined the patient face to face 03/20/2023, with more than 50% of the patient's GI provider contact time being with me. I agree with the findings outlines in the advanced practitioner's note, with any exceptions being included in my brief impression and plan.    HPI:    Agree with H+P as outlined. well    EXAM:  Gen - Comfortable--appearing, MM-pink and  moist  Abd - soft and NT  CNS - Alert, OTPP    A/P:  #1 Recent rectal bleeding (improving) - hemorrhoidal versus diverticular    - Agree with plan as outlined.  - Supportive management  - Monitor  - May need colonoscopy.      Electronically signed by Hermina Staggers, MD at 03/24/2023  9:19 PM EST

## 2023-03-20 NOTE — Unmapped (Signed)
Formatting of this note might be different from the original.  Report received from [facility]. Questions answered per policy.   Electronically signed by Elliot Gurney, RN at 03/20/2023  7:47 PM EST

## 2023-03-20 NOTE — Unmapped (Signed)
Formatting of this note might be different from the original.  37 year old male past medical history ESRD on HD with history of renal transplant presents with melena  Electronically signed by Hope Budds, DO at 03/20/2023  1:52 PM EST

## 2023-03-20 NOTE — Unmapped (Signed)
Formatting of this note is different from the original.  Nursing End of Shift Summary    Pertinent changes to patient conditions and/or care this shift: Coreg held per physician, Neurontin, ProAmatine added, no blood in BM, no other pertinent changes during shift.       Vital Signs Weight: 82.2 kg (181 lb 3.2 oz) (03/19/23 2239)    Temp: 99.3 F (37.4 C)  Temp Source: Oral    Heart Rate:102    BP: 129/71    Respirations: 14   Oxygen Saturation SpO2: 99 %  O2 Delivery: Room air  O2 Device: None (Room air)  O2 Flow Rate (l/min): 0 l/min    Incentive Spirometry Incentive Spirometry: No      Diet Diet Renal;    Intake/Output Totals  Intake/Output          03/20/23 0700 - 03/21/23 0659     [id number] 1900-0659 Total        Intake    P.O.  840  -- 840    I.V.  0  -- 0    Blood  0  -- 0    Other  0  -- 0    Total Intake 840 -- 840      Output    Urine  0  -- 0    Urine 0 -- 0    Weight of Briefs in mL 0 -- 0    Urine Occurrence 0 x -- 0 x    Emesis/NG output  0  -- 0    Emesis 0 -- 0    Emesis Occurrence 0 x -- 0 x    Other  0  -- 0    Other 0 -- 0    Stool  0  -- 0    Stool Occurrence 0 x -- 0 x    Stool 0 -- 0    Blood  0  -- 0    Blood output 0 -- 0    Total Output 0 -- 0             Activity Activity: Up ad lib  Level of Assistance: Independent  Activity Tolerance: Tolerated Well  Ambulation Attempts this Shift: Did not ambulate this shift    Telemetry     Telemetry Abnormalities No     Labs No results found for: FUNCTIONAL, PLTAGGREG    Lab Results   Component Value Date    RBC 3.50 (L) 03/20/2023    WBC 4.7 03/20/2023    HCT 31.9 (L) 03/20/2023    HGB 10.6 (L) 03/20/2023    PLATELETCNT 285 03/20/2023    MCH 30.2 03/20/2023    MCHC 33.2 03/20/2023    MCV 91.2 03/20/2023    MPV 7.9 03/20/2023     Lab Results   Component Value Date    ALBUMIN 3.6 03/20/2023    SODIUM 136 03/20/2023    POTASSIUM 3.8 03/20/2023    CHLORIDE 97 (L) 03/20/2023    CO2 30 03/20/2023    ANIONGAP 9 03/20/2023    GLUCOSE 113 (H) 03/20/2023     CREATININE 7.4 (HH) 03/20/2023    BUN 24 03/20/2023    CALCIUM 8.6 03/20/2023    PROTEINTOTAL 8.0 (H) 03/20/2023    TBILIRUBIN 0.5 03/20/2023    ALP 70 03/20/2023    AST 15 03/20/2023    ALTSGPT 7 (L) 03/20/2023    OSMOLALITY 277 03/20/2023    AGRATIO 0.8 03/20/2023    BC 3 (L) 03/20/2023  ESTIMATEDGFR 8 03/20/2023         Taking all meds No   Any PRN meds given? No    Current IV Infusions     Diuretics      SDOH Screen Assessed? Not Assessed   Discharge Planning home   Expected Discharge Date    Education Provided Other (Comment) fall prevention           Electronically signed by Pierce Crane, RN at 03/20/2023  6:57 PM EST

## 2023-03-20 NOTE — Consults (Signed)
Associated Order(s): IP CONSULT TO NEPHROLOGY  Formatting of this note is different from the original.  Images from the original note were not included.      [facility]  CONSULTATION REPORT    REASON FOR CONSULTATION  Evaluation of dialysis needs while inpatient.    CHIEF COMPLAINT    Chief Complaint   Patient presents with    Rectal Bleeding     HISTORY OF PRESENT ILLNESS    Patient is a 37 y.o. African American male with a past medical history of HTN, HLD, recurrent clostridium difficle, and ESRD s/p rejection of transplanted kidney on maintenance hemodialysis support at [facility] on a MWF schedule under the direction of Dr. Karel Jarvis who presented to [facility] yesterday for possible GIB. He reports that he was having blood in his stools prior to admission. He notes that he ran dialysis on Monday and Tuesday due to the holiday schedule this week. Denies any dyspnea, nausea or vomiting. He notes that he has not had any other bloody stools since admission. He does report that he has been having issues with low blood pressure, and that Dr. Karel Jarvis as held his Coreg recently.      Nephrology has been consulted to evaluate the patient's needs for renal replacement therapy while inpatient.    REVIEW OF SYSTEMS  ROS review and listed below or negative unless mentioned in HPI.    MEDICAL HISTORY  Past Medical History:   Diagnosis Date    Blood transfusion, without reported diagnosis     Clostridium difficile colitis 10/12/2022    Epigastric pain     Fistula, arteriovenous, acquired (CMS/HCC)     has old fistula right arm, may be used for secondary fistula creation. 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 May be used for secondary fistula creation)    Nausea and vomiting, unspecified vomiting type     S/P dialysis catheter insertion (CMS/HCC)     chest     SURGICAL HISTORY  Past Surgical History:   Procedure Laterality Date    AV FISTULOGRAM N/A 10/04/2011    AV FISTULOGRAM performed by Audelia Acton, MD at [facility] VASCULAR LABS    EMBOLIZATION Right 10/04/2011    EMBOLIZATION performed by Audelia Acton, MD at [facility] VASCULAR LABS    HX ARTERIO-VENOUS FISTULA Right 08/16/2011    ARTERIO-VENOUS FISTULA performed by Audelia Acton, MD at [facility] CVOR    HX BROKEN/FX BONES      lt ankle ten pins and a plate    HX COLONOSCOPY N/A 07/01/2022    COLONOSCOPY /C ARGON PLASMA COAGULATION performed by Princess Bruins, MD at [facility] MAIN OR    HX EGJ N/A 12/03/2022    EGD /C BIOPSY performed by Josetta Huddle, MD at [facility] ENDO    HX OTHER SURGICAL HISTORY      tunnel cath 06/2011 [facility]    HX ULTRASOUND, INTRAOPERATIVE Right 08/16/2011    ULTRASOUND, INTRAOPERATIVE performed by Audelia Acton, MD at [facility] CVOR    TUNNEL CATH  PLACEMENT N/A 08/02/2011    TUNNEL CATH PLACEMENT performed by Audelia Acton, MD at [facility] VASCULAR LABS    VENOUS PTA Right 10/04/2011    VENOUS PTA performed by Audelia Acton, MD at [facility] VASCULAR LABS     MEDICATIONS  Reviewed on Epic.   Medications Prior to Admission   Medication Sig Dispense Refill Last Dose    gabapentin (NEURONTIN) 100 mg capsule Take 100 mg by mouth Three times a day.       oxyCODONE (ROXICODONE) 10 mg immediate release tablet Take 10 mg by mouth Once daily as needed for Pain.       traMADoL (ULTRAM) 50 mg tablet Take 50 mg by mouth Three times a day as needed for Pain.       sucroferric oxyhydroxide (VELPHORO) 500 mg Chew Take 500 mg by mouth Three times a day. With meals   03/19/2023    lidocaine HCL 2.8 % Gel 1 Application by Apply externally route Twice a day. 100 g 1     carvediloL (COREG) 12.5 mg tablet Take 1 Tablet by mouth Twice a day for 90 days.  180 Tablet 0     cholecalciferol, vitamin D3, (VITAMIN D3) Tab tablet Take 2,000 Units by mouth Every morning.       NIFEdipine 60 mg ER tablet Take 60 mg by mouth Twice a day.        ALLERGIES    Nsaids (non-steroidal anti-inflammatory drug)    SOCIAL HISTORY  Social History     Socioeconomic History    Marital status: Married   Tobacco Use    Smoking status: Never    Smokeless tobacco: Never   Vaping Use    Vaping status: Never Used   Substance and Sexual Activity    Alcohol use: No    Drug use: Yes     Frequency: 2.0 times per week     Types: Marijuana     Social Determinants of Health     Food Insecurity: No Food Insecurity (03/19/2023)    Hunger Vital Sign     Worried About Running Out of Food in the Last Year: Never true     Ran Out of Food in the Last Year: Never true   Transportation Needs: No Transportation Needs (03/19/2023)    PRAPARE - Therapist, art (Medical): No     Lack of Transportation (Non-Medical): No   Intimate Partner Violence: Not At Risk (03/19/2023)    Humiliation, Afraid, Rape, and Kick questionnaire     Fear of Current or Ex-Partner: No     Emotionally Abused: No     Physically Abused: No     Sexually Abused: No   Housing Stability: Low Risk  (03/19/2023)    Housing Stability Vital Sign     Unable to Pay for Housing in the Last Year: No     Number of Times Moved in the Last Year: 0     Homeless in the Last Year: No     FAMILY HISTORY  Family History   Problem Relation Name Age of Onset    Hypertension Mother      Hypertension Father       PHYSICAL EXAM  LAST RECORDED VITAL SIGNS  Temp: 97.9 F (36.6 C)  Pulse: 84  BP: 118/68  Respirations: 14  SpO2: 99 %  O2 Flow Rate (l/min): 0 l/min    GENERAL: Non-toxic appearance. No acute distress.   LUNGS: Respirations even and unlabored.  Clear to auscultation bilaterally.   CARDIAC: Normal S1, S2. No rubs or murmur. No lower extremity edema.   NEUROLOGIC: Awake, alert and oriented No myoclonus.       ASSESSMENT AND  PLAN  1.  End Stage Renal Disease. Patient is HD-dependent (MWF at [facility]), and will maintain outpatient HD schedule as much as possible. Reassess daily to determine if the patient needs additional treatments while inpatient. Please retrieve HD program from the outpatient unit. No urgent HD indication today. Plan for next HD on Friday. Avoid MRI gadolinium. Please dose all medications for CrCl <10 ml/min.     2.  Other hypotension. Blood pressures soft/stable. Of note, patient was recently admitted at a hospital in [place] per chart review with Care Everywhere. He was started on midodrine at that time. Coreg is being held this admission. Nifedipine was discontinued yesterday as well. Target SBP around 130-150 mmHg pre-HD.    3.  Anemia in CKD complicated by possible GIB. Hemoglobin is stable, but down trending at 10.6. No need for pRBC transfusion. GI consulted. Will transfuse on HD days if hemoglobin <7, otherwise defer transfusions to primary team/GI.     4.  CKD Bone and Mineral. Follow calcium and phosphorus product. Maintain this below 55. Calcium is stable. Check phosphorus. Continue current dose of binders.    5.  Failed renal transplant. Patient follows with [facility] for transplant medicine. They are weaning his immunosuppression per chart review. Continue current doses of immunosuppression per his current regimen. He is on Myfortic, prednisone, Prograf per his report.     Medications, lab results reviewed.    Case discussed with Dr. Astrid Drafts.    Thank you for allowing [place] to participate in the care of Mr. Yaffe.    Signed:  Delfina Redwood, APRN  03/20/2023  2:38 PM    I individually spent 15 minutes, less than half of the total time needed to generate this split-share documentation.     Nephrology Attending Note:    Patient was seen and examined by me during rounds. Chart, labs, medications and radiology has been reviewed. I have made clinical decisions for management plans based on my  personal assessment and I have discussed the management plans in details with NP. Plans discussed with patient during rounding and also with primary team. I reviewed the above note and I agree with the above findings and plans listed with the following addendum:    On my exam:    Chest: CTAB  LE: no edema     A/P:    ASSESSMENT AND PLAN  End Stage Renal Disease. Patient is HD-dependent  on MWF        . Avoid MRI gadolinium. S/P HD on Sunday and Tuesday, plans for next HD on Friday    Other hypotension. Blood pressures soft/stable. Of note, patient was recently admitted at a hospital in [place] per chart review with Care Everywhere. He was started on midodrine at that time. Coreg is being held this admission. Nifedipine was discontinued yesterday as well. Midodrine as needed is ordered    Anemia in CKD complicated by possible GIB. Last Hgb level is at/above/below: at goal.No acute indication for PRBCs transfusion.  GI consulted     CKD Bone and Mineral. Follow calcium and phosphorus product. . Phos levels in am     Potassium management: Adjust K+ bath per K+ level with HD.    Failed renal transplant. Patient follows with [facility] for transplant  medicine. They are weaning his immunosuppression per chart review. Continue current doses of immunosuppression per his current regimen. He is on Myfortic, prednisone, Prograf per his report.     Medications. Please dose all meds for CrCl <10 ml/min.    Medications, Lab results reviewed.    Thank you for allowing me to participate in the care of your patient.    Medications, Lab results reviewed.    Thank you for allowing me to participate in the care of your patient.        I individually spent 17 minutes, more than half of the total time needed to generate this split-share documentation.      Note is generated using voice recognition computer software. Inadvertent transcription errors may have occurred while dictating the note. Common sense approach is appreciated.      Brion Aliment, MD.     Electronically signed by Brion Aliment, MD at 03/20/2023  3:51 PM EST

## 2023-03-20 NOTE — Assessment & Plan Note (Signed)
Associated Problem(s): Rectal bleed  Formatting of this note might be different from the original.  Melena  H/H stable  GI consulted  Continue PPI     ESRD on HD  Hx of renal transplant   Normally dialyzes MWF via LUE AVF  Nephrology consulted  Renally dose meds  Avoid nephrotoxins  Electronically signed by Hope Budds, DO at 03/20/2023  1:52 PM EST

## 2023-03-20 NOTE — Progress Notes (Signed)
Formatting of this note might be different from the original.  Pt A/Ox3 assessment complete charted in flowsheets. Pt states no needs at this time. Bed in lowest position with wheels locked bedside table and call light within reach, will continue with plan of care.     Electronically signed by Pierce Crane, RN at 03/20/2023  4:38 PM EST

## 2023-03-20 NOTE — Unmapped (Signed)
Formatting of this note might be different from the original.    Problem: Discharge Planning  Goal: Knowledge of discharge instructions  Description: Interventions:  1. Consult social work for post discharge needs  2. Education, activity restrictions  3. Education, post discharge follow up  4. Education, prescribed medication  5. Education, when to call provider  6. Education, discharge diet  Outcome: Ongoing    Problem: Activity Intolerance  Goal: Improved activity tolerance  Description: Interventions:  1. Education, prescribed activity level  2. Progressive ambulation program  Outcome: Ongoing    Problem: Fluid Volume, Imbalanced, Risk for  Goal: Balanced intake and output  Description: Interventions:  1. Body weight monitoring  2. Intake and output measurments  Outcome: Ongoing    Problem: Infection, Risk for  Goal: Patient will be free from infection  Description: Interventions:  1. Monitor for signs and symptoms of infection: fever, increased WBC, burning with urination or chills  2. Monitor insertion site for redness, tenderness, or drainage  3. Hand hygiene per CDC guidelines  4. Discontinuation of invasive devices when not needed  5. Place patient in appropriate isolation  Outcome: Ongoing    Problem: Nutrition Deficit  Goal: Adequate nutritional intake  Description: Interventions:  -Consult to dietician  -Intake and output measurement  -Calorie count if indicated  Outcome: Ongoing  Goal: Body weight within specified parameters  Description: Interventions:  - Body weight measurement  Outcome: Ongoing    Problem: Pain, Acute  Goal: Communication of presence of pain  Description: Interventions:  - Nonpharmacologic pain management  - Medication administration  - Education, pain scale  Outcome: Ongoing    Problem: Skin Integrity, Impaired, Risk for  Goal: Absence of pressure injury  Description: Interventions:  - Skin assessment  - Patient repositioning every 2 hours if decreased mobility  Outcome:  Ongoing    Electronically signed by Pierce Crane, RN at 03/20/2023  4:38 PM EST

## 2023-03-20 NOTE — Unmapped (Signed)
Formatting of this note might be different from the original.    THIS MEDICATION RECONCILIATION TECHNICIAN  HAS CLARIFIED THE PRIOR TO ADMISSION MEDICATION LIST AND IS READY FOR REVIEW.    PTA LIST #CHANGES: 15    CLARIFICATIONS: Med Rec Consult Order:    Vitamin D3: Patient notes taking medication on dialysis days - MWF.    Lidocaine 2.8% gel: Patient notes using gel on dialysis days - MWF.    Nifedipine 60 MG ER: Patient reports holding medication when experiencing low BP. LFD: 04/13/22 for 90 days.    Carvedilol 12.5 MG: Patient holds medication when experiencing low BP. LFD: 12/07/22 for 90 days.    ALLERGY RECONCILIATION: Yes - No change to allergy list.    ADDITIONS:    Gabapentin 100 MG: Take 100 MG PO TID. LFD: 03/14/23 for 30 days.    Oxycodone 10 MG: Take 1 tablet PO daily PRN. LFD: 03/14/23 for 30 days.    Tramadol 50 MG: Take 1 tablet PO TID PRN. LFD: 03/14/23 for 30 days.    MARKED FOR REMOVAL:    Atovaquone 750 MG/5 mL. Therapy completed. LFD: 10/16/22 for 21 days.    Prednisone 5 MG. Therapy completed. LFD: 09/14/22 for 7 days.    Bactrim 400-80. Therapy completed. LFD: 07/16/22 for 28 days.    Auryxia 1 GM. Medication was replaced with Velphoro 500 MG. LFD 02/13/23 for 90 days.    Patient notes the following medications were for a renal transplant, but have now been D/C due to patient being on dialysis now:    Mycophenolate 180 MG DR. LFD: 10/13/22 for 90 days.    Tacrolimus 1 MG. LFD: 07/16/22 for 30 days.    Valganciclovir 50 MG/mL. LFD: 10/15/22 for 84 days.    SOURCE OF INFORMATION:  Patient Recall and Electronic Prescription Database    Florentina Addison  (03/20/2023  2:26 PM)    Electronically signed by Rosann Auerbach, PHARMD at 03/20/2023  4:23 PM EST    Associated attestation - Rosann Auerbach, [facility] - 03/20/2023  4:23 PM EST  Formatting of this note might be different from the original.  I agree with the above content of this note and verified these changes match the prior to admission medication  list.    Rosann Auerbach, [facility]

## 2023-03-20 NOTE — Progress Notes (Signed)
Formatting of this note might be different from the original.  Pt awake resting in bed with eyes opened. No s/s acute distress noted. Respirations even/unlabored, currently on RA.  Assessment completed, see flowsheet. A&OX3. Pt denies any c/o pain at this time. IV Site in RUE.  No redness or swelling noted at site. Denies any other needs or c/o pain at this time. Discussed today's plan of care, verbalized understanding. Bed locked in lowest position, side rails up x2, call light within easy reach.    Electronically signed by Elliot Gurney, RN at 03/20/2023 11:28 PM EST

## 2023-03-20 NOTE — Unmapped (Signed)
Formatting of this note might be different from the original.  Roberto Rice    Test Name: creatinine  Results:  7.4  03/20/2023  Time: 0236  Received from:[facility]  R/V by Ananias Pilgrim RN     (Required: Attempt notification within 30 minutes or document reason for no notification)    Physician: Mason Jim  03/20/2023  Time: 0236  Notified: No - Due to Expected Result: previoudly 7.0       Electronically signed by Ananias Pilgrim, RN at 03/20/2023  2:39 AM EST

## 2023-03-20 NOTE — Progress Notes (Signed)
Formatting of this note is different from the original.  HOSPITAL MEDICINE PROGRESS NOTE    Patient:  Roberto Rice  MRN:  [id number]  Room: 1O841/6S063K  Admit Date: 03/19/2023  Hospital Day:   LOS: 0 days   Current Date: 03/20/2023    Chief Complaint: Follow-up for Rectal bleed     Subjective/Interval History: No acute events. Some melena overnight    Relevant ROS: 10 point ROS reviewed and pertinent positives/negatives per HPI     Vitals for 03/20/2023  Blood pressure 118/68, pulse 84, temperature 97.9 F (36.6 C), temperature source Oral, resp. rate 14, height 5' 11 (180.3 cm), weight 82.2 kg (181 lb 3.2 oz), SpO2 99%.    Physical exam:  Gen: Alert & Oriented, NAD  CVS: RRR, no murmurs or rubs  Lungs: CTA b/l, no wheezing, no rhonci, normal effort  Abd: soft, nondistended, nontender  Neuro: CN2-12 grossly intact  Psych: Pleasant, appropriate mood and affect  Ext: LUE AVF palpable thrill     Labs and Imaging:  Recent Labs     03/20/23  0100 03/19/23  1755   WBC 4.7 4.3*   RBC 3.50* 3.91*   HGB 10.6* 11.4*   HCT 31.9* 37.1   MCV 91.2 94.9   PLATELETCNT 285 295   SODIUM 136 136   POTASSIUM 3.8 3.6   CHLORIDE 97* 95*   CO2 30 32*   GLUCOSE 113* 66*   BUN 24 20   CREATININE 7.4* 7.0*   CALCIUM 8.6 8.7   TBILIRUBIN 0.5 0.6   ALP 70 82   AST 15 15   ALBUMIN 3.6 4.0   INR  --  1.1     Labs and micro reviewed    Assessment and Plan   Hospital Problems and Relevant comorbid conditions:  Principal Problem:    Rectal bleed  Active Problems:    Renal transplant, status post    ESRD (end stage renal disease) on dialysis (CMS/HCC)    37 year old male past medical history ESRD on HD with history of renal transplant presents with melena    * Rectal bleed  Melena  H/H stable  GI consulted  Continue PPI     ESRD on HD  Hx of renal transplant   Normally dialyzes MWF via LUE AVF  Nephrology consulted  Renally dose meds  Avoid nephrotoxins    Checklist:  --Lines: TDC  --GI ppx: IV protonix  --DVT ppx: SCDs    Assessment and plan  generated using problem oriented charting. I have personally reviewed the previous A/P notes and have included what is applicable today.    Signed,  Hope Budds, DO  03/20/2023      Electronically signed by Hope Budds, DO at 03/20/2023  1:53 PM EST

## 2023-03-21 NOTE — Unmapped (Signed)
Formatting of this note might be different from the original.  Roberto Rice    Test Name: creatinine  Results:  10.4  03/21/2023  Time: 0258  Received from: [facility] in the lab  R/V by: Ananias Pilgrim RN     (Required: Attempt notification within 30 minutes or document reason for no notification)    Physician: Astrid Drafts  03/21/2023  Time: 0258  Notified: No - Due to Expected Result: dialysis patient      Electronically signed by Ananias Pilgrim, RN at 03/21/2023  2:58 AM EST

## 2023-03-21 NOTE — Progress Notes (Signed)
Formatting of this note might be different from the original.  Patient back to floor at this time.  Electronically signed by Clearnce Hasten, LPN at 18/84/1660  2:02 PM EST

## 2023-03-21 NOTE — Unmapped (Signed)
Formatting of this note is different from the original.  Nursing End of Shift Summary    Pertinent changes to patient conditions and/or care this shift: No acute changes, patient had a shower      Vital Signs Weight: 82.2 kg (181 lb 4.8 oz) (03/21/23 0509)    Temp: 97.9 F (36.6 C)  Temp Source: Axillary    Heart Rate:84    BP: 104/66    Respirations: 14   Oxygen Saturation SpO2: 98 %  O2 Delivery: Room air  O2 Device: None (Room air)  O2 Flow Rate (l/min): 0 l/min    Incentive Spirometry Incentive Spirometry: No      Diet Diet Renal;    Intake/Output Totals  Intake/Output          03/20/23 0700 - 03/21/23 0659     [id number] 1900-0659 Total        Intake    P.O.  840  120 960    I.V.  0  0 0    Blood  0  -- 0    Other  0  0 0    Total Intake 840 120 960      Output    Urine  0  0 0    Urine 0 0 0    Weight of Briefs in mL 0 -- 0    Urine Occurrence 0 x 0 x 0 x    Emesis/NG output  0  0 0    Emesis 0 0 0    Emesis Occurrence 0 x 0 x 0 x    Other  0  0 0    Other 0 0 0    Stool  0  0 0    Stool Occurrence 0 x 0 x 0 x    Stool 0 0 0    Blood  0  0 0    Blood output 0 0 0    Total Output 0 0 0             Activity Activity: Up ad lib  Level of Assistance: Independent  Activity Tolerance: Tolerated Well  Ambulation Attempts this Shift: Did not ambulate this shift    Telemetry     Telemetry Abnormalities No     Labs No results found for: FUNCTIONAL, PLTAGGREG    Lab Results   Component Value Date    RBC 3.33 (L) 03/21/2023    WBC 4.8 03/21/2023    HCT 30.6 (L) 03/21/2023    HGB 10.3 (L) 03/21/2023    PLATELETCNT 263 03/21/2023    MCH 30.9 03/21/2023    MCHC 33.6 03/21/2023    MCV 92.0 03/21/2023    MPV 8.0 03/21/2023     Lab Results   Component Value Date    ALBUMIN 3.6 03/21/2023    SODIUM 136 03/21/2023    POTASSIUM 4.2 03/21/2023    CHLORIDE 99 (L) 03/21/2023    CO2 26 03/21/2023    ANIONGAP 11 03/21/2023    GLUCOSE 86 03/21/2023    CREATININE 10.4 (HH) 03/21/2023    BUN 41 (H) 03/21/2023    CALCIUM 8.7 03/21/2023     PROTEINTOTAL 7.2 03/21/2023    TBILIRUBIN 0.4 03/21/2023    ALP 72 03/21/2023    AST 12 03/21/2023    ALTSGPT 5 (L) 03/21/2023    OSMOLALITY 281 03/21/2023    AGRATIO 1.0 03/21/2023    BC 4 (L) 03/21/2023    ESTIMATEDGFR 6 03/21/2023  Taking all meds No   Any PRN meds given? No    Current IV Infusions     Diuretics      SDOH Screen Assessed? Not Assessed   Discharge Planning home   Expected Discharge Date    Education Provided Other (Comment) plan of care reviewed, fall prevention, call out for assistance            Electronically signed by Elliot Gurney, RN at 03/21/2023  5:35 AM EST

## 2023-03-21 NOTE — Discharge Summary (Signed)
Formatting of this note is different from the original.  Physician Discharge Summary    Patient ID:  MRN: [id number]  Name: Roberto Rice  Age: 37 y.o.  Birthday:  Jun 27, 1985  Admit Date: 03/19/2023  4:55 PM  Discharge Date:  03/21/2023  Unit: [id number]  Admitting Physician:   Discharge Physician: Marshell Garfinkel, DO    Discharge Diagnosis:  Principal Problem:    Rectal bleed  Active Problems:    Renal transplant, status post    ESRD (end stage renal disease) on dialysis (CMS/HCC)    Hospital Course:     * Rectal bleed  Roberto Rice is a 38 year old male with end-stage renal disease status post failed renal transplant that presented for evaluation of rectal bleeding.  Rectal bleeding subsided and hemoglobin remained stable.  Patient and GI team agreeable for outpatient follow-up for outpatient endoscopic evaluation.  He will be discharged on twice daily Protonix.  He also had some hypotension which has been an ongoing issue for the preceding months per patient.  His home antihypertensives were discontinued and his as needed midodrine was changed to scheduled 3 times daily.  He will discharge home today and follow-up with vascular surgery tomorrow for planned outpatient fistulogram.  He will also complete scheduled HD session tomorrow.  He will follow with PCP in 1 week, GI in 2 weeks, nephrology in 2 weeks.    Physical Exam on the Date of Discharge:    Constitutional: well-developed, well-nourished, and in no distress.   HENT:   Head: Normocephalic and atraumatic.   Eyes: Pupils are equal, round, and reactive to light. Conjunctivae and EOM are normal.   Cardiovascular: Normal rate. No murmur heard.  Pulmonary/Chest: Effort normal. No respiratory distress.   Abdominal: Soft. Bowel sounds are normal.  There is no abdominal tenderness.   Neurological: Alert and oriented to person, place, and time.   Skin: Skin is warm and dry.   Psychiatric: Mood and affect normal.     Consults:    IP CONSULT TO NEPHROLOGY  IP CONSULT  TO GASTROENTEROLOGY    Significant Diagnostic Studies:     CBC:   Lab Results   Component Value Date/Time    WBC 4.8 03/21/2023 0103    RBC 3.33 03/21/2023 0103    HGB 10.3 03/21/2023 0103    HCT 30.6 03/21/2023 0103    MCV 92.0 03/21/2023 0103    MCH 30.9 03/21/2023 0103    MCHC 33.6 03/21/2023 0103    RDW 17.6 03/21/2023 0103    MPV 8.0 03/21/2023 0103    PLATELETCNT 263 03/21/2023 0103     BMP:   Lab Results   Component Value Date/Time    SODIUM 136 03/21/2023 0103    POTASSIUM 4.2 03/21/2023 0103    CHLORIDE 99 03/21/2023 0103    CO2 26 03/21/2023 0103    GLUCOSE 86 03/21/2023 0103    BUN 41 03/21/2023 0103    CREATININE 10.4 03/21/2023 0103    CALCIUM 8.7 03/21/2023 0103    OSMOLALITY 281 03/21/2023 0103    BC 4 03/21/2023 0103     Disposition:  home    Discharge Condition:  good    Discharge Medications and Orders:    Current Discharge Medication List       START taking these medications    Details   midodrine (PROAMATINE) 5 mg tablet Take 1 Tablet by mouth Three times a day for 30 days.  Qty: 90 Tablet, Refills: 0    Associated Diagnoses:  ESRD (end stage renal disease) (CMS/HCC)     pantoprazole (PROTONIX) 40 mg DR tablet Take 1 Tablet by mouth Every 12 hours for 14 days.  Qty: 28 Tablet, Refills: 0    Associated Diagnoses: Melena; Rectal bleed         CONTINUE these medications which have CHANGED    Details   predniSONE (DELTASONE) 5 mg tablet Take 1 Tablet by mouth Once Daily.    Associated Diagnoses: ESRD (end stage renal disease) (CMS/HCC)         CONTINUE these medications which have NOT CHANGED    Details   gabapentin (NEURONTIN) 100 mg capsule Take 100 mg by mouth Three times a day.     oxyCODONE (ROXICODONE) 10 mg immediate release tablet Take 10 mg by mouth Once daily as needed for Pain.     traMADoL (ULTRAM) 50 mg tablet Take 50 mg by mouth Three times a day as needed for Pain.     sucroferric oxyhydroxide (VELPHORO) 500 mg Chew Take 500 mg by mouth Three times a day. With meals     lidocaine HCL  2.8 % Gel 1 Application by Apply externally route Twice a day.  Qty: 100 g, Refills: 1    Associated Diagnoses: Pain of skin     cholecalciferol, vitamin D3, (VITAMIN D3) Tab tablet Take 2,000 Units by mouth Every morning.         STOP taking these medications      carvediloL (COREG) 12.5 mg tablet Comments:   Reason for Stopping:       NIFEdipine 60 mg ER tablet Comments:   Reason for Stopping:            Discharge Procedure Orders   Follow up with your PCP, Domenic Polite, APRN   Order Comments: Domenic Polite, APRN     Order Specific Question Answer Comments   Follow up when? 1 week      Follow-Up with GI, Nephrology; 2 weeks     Order Specific Question Answer Comments   Follow up with whom? GI, Nephrology    Follow up when? 2 weeks      Follow-Up with Vascular surgery; Other   Order Comments: Tomorrow for fistulogram     Order Specific Question Answer Comments   Follow up with whom? Vascular surgery    Follow up when? Other      PCP:  Domenic Polite, APRN  [phone] [place] [address] / [place] [address]  [phone]    Future Appointments   Date Time Provider Department Center   06/11/2023  8:45 AM Domenic Polite, APRN [facility] None     ----  Time spent on discharge process > 30 mins    Signed:  Marshell Garfinkel, DO  03/21/2023   Electronically signed by Marshell Garfinkel, DO at 03/21/2023  3:37 PM EST

## 2023-03-21 NOTE — Unmapped (Signed)
Formatting of this note might be different from the original.    Problem: Discharge Planning  Goal: Knowledge of discharge instructions  Description: Interventions:  1. Consult social work for post discharge needs  2. Education, activity restrictions  3. Education, post discharge follow up  4. Education, prescribed medication  5. Education, when to call provider  6. Education, discharge diet  Outcome: Ongoing    Problem: Activity Intolerance  Goal: Improved activity tolerance  Description: Interventions:  1. Education, prescribed activity level  2. Progressive ambulation program  Outcome: Ongoing    Problem: Fluid Volume, Imbalanced, Risk for  Goal: Balanced intake and output  Description: Interventions:  1. Body weight monitoring  2. Intake and output measurments  Outcome: Ongoing    Problem: Infection, Risk for  Goal: Patient will be free from infection  Description: Interventions:  1. Monitor for signs and symptoms of infection: fever, increased WBC, burning with urination or chills  2. Monitor insertion site for redness, tenderness, or drainage  3. Hand hygiene per CDC guidelines  4. Discontinuation of invasive devices when not needed  5. Place patient in appropriate isolation  Outcome: Ongoing    Problem: Nutrition Deficit  Goal: Adequate nutritional intake  Description: Interventions:  -Consult to dietician  -Intake and output measurement  -Calorie count if indicated  Outcome: Ongoing  Goal: Body weight within specified parameters  Description: Interventions:  - Body weight measurement  Outcome: Ongoing    Problem: Pain, Acute  Goal: Communication of presence of pain  Description: Interventions:  - Nonpharmacologic pain management  - Medication administration  - Education, pain scale  Outcome: Ongoing    Problem: Skin Integrity, Impaired, Risk for  Goal: Absence of pressure injury  Description: Interventions:  - Skin assessment  - Patient repositioning every 2 hours if decreased mobility  Outcome:  Ongoing    Electronically signed by Elliot Gurney, RN at 03/21/2023  4:42 AM EST

## 2023-03-21 NOTE — Progress Notes (Signed)
Formatting of this note might be different from the original.  Patient to ECHO at this time.   Electronically signed by Clearnce Hasten, LPN at 13/10/6576  1:19 PM EST

## 2023-03-21 NOTE — Assessment & Plan Note (Signed)
Associated Problem(s): Rectal bleed  Formatting of this note might be different from the original.  Qui Sit is a 37 year old male with end-stage renal disease status post failed renal transplant that presented for evaluation of rectal bleeding.  Rectal bleeding subsided and hemoglobin remained stable.  Patient and GI team agreeable for outpatient follow-up for outpatient endoscopic evaluation.  He will be discharged on twice daily Protonix.  He also had some hypotension which has been an ongoing issue for the preceding months per patient.  His home antihypertensives were discontinued and his as needed midodrine was changed to scheduled 3 times daily.  He will discharge home today and follow-up with vascular surgery tomorrow for planned outpatient fistulogram.  He will also complete scheduled HD session tomorrow.  He will follow with PCP in 1 week, GI in 2 weeks, nephrology in 2 weeks.  Electronically signed by Marshell Garfinkel, DO at 03/21/2023  3:35 PM EST

## 2023-03-21 NOTE — Progress Notes (Signed)
Formatting of this note is different from the original.      Nephrology Follow-up Note    CC: Follow-up on ESRD    Interval history: Over the last 24 hours, symptoms/labs progression:  unchanged. Associated symptoms: systolic blood pressure around 100    Physical Exam  Constitutional: negative for distress  Cardiovascular:  negative for edema  Pulmonary: negative for audible wheezing    Recent Labs     03/21/23  0103 03/20/23  0100 03/19/23  1755   WBC 4.8 4.7 4.3*   RBC 3.33* 3.50* 3.91*   HGB 10.3* 10.6* 11.4*   HCT 30.6* 31.9* 37.1   MCV 92.0 91.2 94.9   PLATELETCNT 263 285 295   SODIUM 136 136 136   OSMOLALITY 281 277 273   POTASSIUM 4.2 3.8 3.6   MAGNESIUM 2.0  --   --    PHOSPHORUS 5.1*  --   --    CHLORIDE 99* 97* 95*   CO2 26 30 32*   ANIONGAP 11 9 9    GLUCOSE 86 113* 66*   BUN 41* 24 20   CREATININE 10.4* 7.4* 7.0*   CALCIUM 8.7 8.6 8.7   TBILIRUBIN 0.4 0.5 0.6   ALP 72 70 82   AST 12 15 15    ALTSGPT 5* 7* 7*   ALBUMIN 3.6 3.6 4.0   INR  --   --  1.1     Assessment/plan  ESRD on hemodialysis MWF schedule.  No indication for dialysis today and will proceed with his usual dialysis tomorrow  Melena workup per primary team  Hypotension despite adjusting dry weight recently on outpatient basis.  I will check an echocardiogram to rule out pericardial effusion.  Coreg is now on hold, and midodrine was started    Verlene Mayer, M.D.   03/21/2023    Voice transcription technology was used for dictation of this note and sound-alike words might be erroneously placed despite reviewing the note for accuracy.  Errors in dictation may reflect use of voice recognition software and not all errors in transcription may have been detected prior to signing.    Electronically signed by Verlene Mayer, MD at 03/21/2023 10:39 AM EST

## 2023-03-21 NOTE — Progress Notes (Signed)
Formatting of this note might be different from the original.  Pt awake resting in bed with eyes opened.  No s/s acute distress noted.  Respirations even/unlabored.  Assessment completed, see flowsheet.  A&OX3.  IV Site clean, dry, and intact.  No redness or swelling noted at site.  Denies any other needs or c/o pain at this time.  Discussed today's plan of care, pt verbalized understanding.  Bed locked in lowest position, side rails up x2, call light within easy reach.    Electronically signed by Clearnce Hasten, LPN at 54/11/8117 10:28 AM EST

## 2023-03-21 NOTE — Progress Notes (Signed)
Formatting of this note might be different from the original.  Discharge instructions explained with verbalized understanding and given to pt at bedside. IV removed without complications - catheter intact. Declined wheelchair to car. Ambulated to car. No distress noted upon discharge.    Electronically signed by Clearnce Hasten, LPN at 82/99/3716  4:52 PM EST

## 2023-03-22 NOTE — Telephone Encounter (Signed)
Formatting of this note might be different from the original.  I offered pt 03/26/2023 to r/s fistulogram.  He declined.  Dr. Kieth Brightly next available is 04/09/2023.  He accepted.  Electronically signed by Jacquelyne Balint at 03/22/2023  1:34 PM EST

## 2023-03-22 NOTE — Telephone Encounter (Signed)
Formatting of this note might be different from the original.  Maryelizabeth Rowan, MD, FSVS, FACS, RVT  Board Certified, General Surgery  Board Certified, Vascular and Endovascular Surgery    Katina Dung, MD, FSVS, FACS, RPVI  Board Certified, General Surgery  Board Certified, Vascular and Endovascular Surgery    Theresa Duty, MD , RPVI    Selinda Eon, APRN  Kae Heller, APRN  Jannetta Quint, APRN  Earnie Larsson, APRN  Sallyanne Kuster, PA-C  Eulis Canner, [place]    Name: Roberto Rice   DOB: January 09, 1986  MRN: [id number]    Your procedure has been rescheduled. Your new date and time are as follows:    OUTPATIENT CATH/VASCULAR LAB PROCEDURES    You have been re-scheduled you for a(n) LUE Fistulogram    Your procedure is scheduled at [facility] on 04/09/2023        Please report to [facility] on [address] (by [employer]). Your arrival time is 10:00 AM.    Changes to your previous instructions, other than date/time are:    You will be sedated for this procedure and will need to have someone accompany you to this appointment.    Please call Elon Jester at [phone]  or [phone] (Dr. Harriette Bouillon) or Viviann Spare at [phone] (Dr. Candi Leash) should you have any questions, concerns or need to reschedule your appointment(s).    Vascular & Endovascular Surgery   Vascular Diagnostics & Intervention   Electronically signed by Jacquelyne Balint at 03/22/2023  1:33 PM EST

## 2023-03-22 NOTE — Progress Notes (Signed)
Formatting of this note is different from the original.  HOSPITALFOLLOWUPCALL    Roberto Rice has been contacted regarding recent hospital admission.  Current medications were reviewed with the patient/caregiver by telephone.    Date of Admission:  04/18/2022    Date of Discharge: 03/21/2023    Facility patient was discharged from: [facility]    Reason for Admission: Principal Problem:    Rectal bleed  Active Problems:    Renal transplant, status post    ESRD (end stage renal disease) on dialysis (CMS/HCC)    Hospital Course:     * Rectal bleed  Roberto Rice is a 37 year old male with end-stage renal disease status post failed renal transplant that presented for evaluation of rectal bleeding.  Rectal bleeding subsided and hemoglobin remained stable.  Patient and GI team agreeable for outpatient follow-up for outpatient endoscopic evaluation.  He will be discharged on twice daily Protonix.  He also had some hypotension which has been an ongoing issue for the preceding months per patient.  His home antihypertensives were discontinued and his as needed midodrine was changed to scheduled 3 times daily.  He will discharge home today and follow-up with vascular surgery tomorrow for planned outpatient fistulogram.  He will also complete scheduled HD session tomorrow.  He will follow with PCP in 1 week, GI in 2 weeks, nephrology in 2 weeks.   Medication reconciliation completed.  Was patient prescribed any new medications and able to obtain them? yes - completed with patient - filling medications today    DME Supplies? None   Ancillary Services? Renal Dialysis on MWF   How is the patient feeling? Good - patient reports doing as good as he can right now. He states needing family member to help him get medications in a little bit. He denies any other needs from [facility] or PCP at this time.   Does the patient have any questions or problems? no   Does the patient need transportation? no   Follow-up Appointment date: 04/16/2023      I  advised the patient to bring his medications in the original bottles and to contact office, or go to either urgent care or emergency care if symptoms return before scheduled appointment.    Nils Flack, DNP BSN RN  03/22/2023 11:43 AM  Ambulatory Care Manager, [facility]     Electronically signed by Nils Flack, RN at 03/22/2023 11:48 AM EST

## 2023-04-06 NOTE — Progress Notes (Signed)
 Formatting of this note might be different from the original.  Patient admitted to room [phone] from ER.  Patient is alert and oriented x 3.  Oriented to room and call light.  Call light within easy reach.  Patient educated on fall precautions and pain scale.  Encouraged to call for assistance before getting out of bed.  Gripper socks given.  No wants or needs at this time.    Electronically signed by Burnetta Sabin, RN at 04/06/2023  1:12 PM EST

## 2023-04-06 NOTE — ED Notes (Signed)
Formatting of this note might be different from the original.  Received report from off going nurse pt medicated per floor orders pt resting in bed no distress noted awaiting admission bed will continue to monitor   Electronically signed by Moses Manners, RN at 04/06/2023  8:16 AM EST

## 2023-04-06 NOTE — Progress Notes (Signed)
 Formatting of this note might be different from the original.  Notified by primary nurse that patient is having BRBPR. Dr. Fredric Mare notified with no new orders received.   Electronically signed by Payton Doughty, RN at 04/06/2023  4:13 PM EST

## 2023-04-06 NOTE — Unmapped External Note (Signed)
 Formatting of this note is different from the original.  Nursing End of Shift Summary    Pertinent changes to patient conditions and/or care this shift: Bleeding from rectum, dizziness, low bp.      Vital Signs Weight: 86.1 kg (189 lb 13.1 oz) (04/06/23 0026)    Temp: 99 F (37.2 C)  Temp Source: Oral    Heart Rate:94    BP: (!) 84/42    Respirations: 18   Oxygen Saturation SpO2: 98 %  O2 Delivery: Room air  O2 Device: None (Room air)    Incentive Spirometry        Diet Diet Renal;Fluid 1500cc; Pro 80G; Low Phosphorus; NA 4000mg  (NAS); K 2000mg  ( )  One Time Message to Dietary    Intake/Output Totals  Intake/Output          04/05/23 0700 - 04/06/23 0659 (Not Admitted)     [id number] 1900-0659 Total        Intake    I.V.  --  2102.13 2102.13    Total Intake -- 2102.13 2102.13      Output    Total Output -- -- --             Activity Activity: Up ad lib  Level of Assistance: Independent    Ambulation Attempts this Shift: Did not ambulate this shift    Telemetry     Telemetry Abnormalities No     Labs No results found for: FUNCTIONAL, PLTAGGREG    Lab Results   Component Value Date    RBC 3.34 (L) 04/06/2023    WBC 9.8 04/06/2023    HCT 31.0 (L) 04/06/2023    HGB 10.1 (L) 04/06/2023    PLATELETCNT 271 04/06/2023    MCH 30.2 04/06/2023    MCHC 32.5 04/06/2023    MCV 92.9 04/06/2023    MPV 7.6 04/06/2023     Lab Results   Component Value Date    ALBUMIN 3.7 04/06/2023    SODIUM 134 (L) 04/06/2023    POTASSIUM 3.5 (L) 04/06/2023    CHLORIDE 95 (L) 04/06/2023    CO2 27 04/06/2023    ANIONGAP 12 04/06/2023    GLUCOSE 93 04/06/2023    CREATININE 8.0 (HH) 04/06/2023    BUN 23 04/06/2023    CALCIUM 8.9 04/06/2023    PROTEINTOTAL 7.8 04/06/2023    TBILIRUBIN 0.5 04/06/2023    ALP 68 04/06/2023    AST 13 04/06/2023    ALTSGPT <3 (L) 04/06/2023    OSMOLALITY 272 04/06/2023    AGRATIO 0.9 04/06/2023    BC 3 (L) 04/06/2023    ESTIMATEDGFR 8 04/06/2023         Taking all meds Yes   Any PRN meds given? No    Current IV  Infusions     Diuretics      SDOH Screen Assessed? Not Assessed   Discharge Planning Home   Expected Discharge Date    Education Provided Other (Comment) Medications, fall risk, bleeding           Electronically signed by Burnetta Sabin, RN at 04/06/2023  6:58 PM EST

## 2023-04-06 NOTE — ED Notes (Signed)
 Formatting of this note might be different from the original.  Pt given toiletry and dental care assisted to restroom no distress noted awaiting admission bed will continue to monitor   Electronically signed by Moses Manners, RN at 04/06/2023 11:05 AM EST

## 2023-04-06 NOTE — Consults (Signed)
 Associated Order(s): IP CONSULT TO NEPHROLOGY  Formatting of this note is different from the original.  04/06/2023  Roberto Rice  31-May-1985    [facility]  Hospital Consultation    HPI: This patient is a 38 y.o. African-American male who was admitted today after being brought to the emergency department from his residence last night due to development of hypotension following outpatient dialysis performed yesterday. Nephrology consultation is being obtained for management of end stage renal disease and provision of hemodialysis.    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 Both kidneys were placed in his right pelvis.     He is awake, alert, and feels unwell with complaint of right lower quadrat abdominal pain over renal allografts.     Past Medical History:   Diagnosis Date    Blood transfusion, without reported diagnosis     Clostridium difficile colitis 10/12/2022    Epigastric pain     Fistula, arteriovenous, acquired (CMS/HCC)     has old fistula right arm, may be used for secondary fistula creation. 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 May be used for secondary fistula creation)    Nausea and vomiting, unspecified vomiting type     S/P dialysis catheter insertion (CMS/HCC)     chest     Past Surgical History:   Procedure Laterality Date    AV  FISTULOGRAM N/A 10/04/2011    AV FISTULOGRAM performed by Audelia Acton, MD at [facility] VASCULAR LABS    EMBOLIZATION Right 10/04/2011    EMBOLIZATION performed by Audelia Acton, MD at [facility] VASCULAR LABS    HX ARTERIO-VENOUS FISTULA Right 08/16/2011    ARTERIO-VENOUS FISTULA performed by Audelia Acton, MD at [facility] CVOR    HX BROKEN/FX BONES      lt ankle ten pins and a plate    HX COLONOSCOPY N/A 07/01/2022    COLONOSCOPY /C ARGON PLASMA COAGULATION performed by Princess Bruins, MD at [facility] MAIN OR    HX EGJ N/A 12/03/2022    EGD /C BIOPSY performed by Josetta Huddle, MD at [facility] ENDO    HX OTHER SURGICAL HISTORY      tunnel cath 06/2011 [facility]    HX ULTRASOUND, INTRAOPERATIVE Right 08/16/2011    ULTRASOUND, INTRAOPERATIVE performed by Audelia Acton, MD at [facility] CVOR    TUNNEL CATH PLACEMENT N/A 08/02/2011    TUNNEL CATH PLACEMENT performed by Audelia Acton, MD at [facility] VASCULAR LABS    VENOUS PTA Right 10/04/2011    VENOUS PTA performed by Audelia Acton, MD at [facility] VASCULAR LABS     Family History   Problem Relation Name Age of Onset    Hypertension Mother      Hypertension Father       Current Facility-Administered Medications  on File Prior to Encounter   Medication Dose Route Frequency Provider Last Rate Last Admin    [COMPLETED] pantoprazole (PROTONIX) injection 80 mg  80 mg Intravenous ONE TIME Rice, Kylie, DO   80 mg at 03/20/23 0914    [COMPLETED] pantoprazole (PROTONIX) injection 40 mg  40 mg Intravenous ONE TIME [name], MD   40 mg at 03/19/23 2010     Current Outpatient Medications on File Prior to Encounter   Medication Sig Dispense Refill    predniSONE (DELTASONE) 5 mg tablet Take 1 Tablet by mouth Once Daily.      midodrine (PROAMATINE) 5 mg tablet Take 1 Tablet by mouth Three times a day for 30 days. 90 Tablet 0    pantoprazole (PROTONIX) 40 mg DR tablet Take 1 Tablet by mouth Every 12 hours for 14 days. 28 Tablet 0    gabapentin (NEURONTIN) 100 mg capsule Take 100 mg by mouth Three times a day.      oxyCODONE  (ROXICODONE) 10 mg immediate release tablet Take 10 mg by mouth Once daily as needed for Pain.      traMADoL (ULTRAM) 50 mg tablet Take 50 mg by mouth Three times a day as needed for Pain.      sucroferric oxyhydroxide (VELPHORO) 500 mg Chew Take 500 mg by mouth Three times a day. With meals      cholecalciferol, vitamin D3, (VITAMIN D3) Tab tablet Take 2,000 Units by mouth Every morning.       Allergies   Allergen Reactions    Nsaids (Non-Steroidal Anti-Inflammatory Drug) Other (See Comments)     CANNOT TAKE DUE TO KIDNEY FAILURE   CANNOT TAKE DUE TO KIDNEY FAILURE       REVIEW OF SYSTEMS: all systems reviewed and negative except HPI and the following systems:   Constitutional: Denies experiencing fevers, rigors, or sweats. Significant for weakness and malaise I the setting of the present illness.  Pulmonary: No cough, shortness of breath, or pleuritic chest pain reported.  Cardiac: No chest pain or palpitations reported.  Gastrointestinal: Denies experiencing nausea, vomiting, diarrhea, melena, or hematochezia.  Genitourinary: Denies experiencing dysuria, hematuria, or polyuria. He has been experiencing right lower quadrant pain.  Hematologic: Denies experiencing bleeding diathesis.   Endocrine: No symptoms of glucose intolerance, adrenal disease, hypothyroidism, or hyperthyroidism reported   Musculoskeletal: No new myalgias or arthralgias reported.   Neurologic: No headaches reported or symptoms of cerebral ischemia elicited.   Psychiatric: No visual nor auditory hallucinations endorsed.     PHYSICAL EXAMINATION   Vitals:    04/06/23 1230 04/06/23 1659 04/06/23 1700 04/06/23 1948   BP: 100/52 (!) 81/41 (!) 84/42 102/57   BP Location:  Right Upper Extremity Right Upper Extremity Right Upper Extremity   Patient Position:  Lying Left Side Lying Left Side Semi Fowlers   Pulse: 84 94  76   Resp: 13 18  17    Temp:  99 F (37.2 C)  98.3 F (36.8 C)   TempSrc:  Oral  Oral   SpO2: 99% 98%  99%   Weight:       Height:          Wt Readings from Last 3 Encounters:   04/06/23 86.1 kg (189 lb 13.1 oz)   03/21/23 82.2 kg (181 lb 4.8 oz)   03/13/23 83.9 kg (185 lb)     GENERAL: Alert and oriented. Well developed, well nourished, responds appropriately to questions. Fonzo is lying on a stretcher in the emergency  department and experiencing no acute distress.   EENMT: Head is normocephalic, atraumatic. Sclerae are anicteric. Mucous membranes moist. No thrush or ulcers are present on his oral mucosa.   NECK: Supple, full range of motion. No jugular venous distention or lymphadenopathy is present and no carotid bruits are audible. Tunneled left internal jugular venous hemodialysis catheter is in place.  CARDIOVASCULAR: Regular rhythm. Normal S1 and S2 heart sounds. No pericardial friction rub is audible.   LUNGS: Clear to auscultation bilaterally. Respirations unlabored.  GI/ABDOMEN: Soft, non-tender, and non-distended. No rebound, rigidity, or guarding is present. Positive bowel sounds in all four quadrants.   MSK/EXTREMITIES: No clubbing or cyanosis. Negative calf pain. There is no peripheral edema.  NEUROLOGIC: No myoclonic jerking observed.   SKIN: Warm and dry. No lesions or rashes are present.     Labs:   Lab Results   Component Value Date    BUN 23 04/06/2023    CREATININE 8.0 (HH) 04/06/2023    MAGNESIUM 1.9 04/06/2023    POTASSIUM 3.5 (L) 04/06/2023    SODIUM 134 (L) 04/06/2023    CHLORIDE 95 (L) 04/06/2023    CO2 27 04/06/2023    CALCIUM 8.9 04/06/2023    ALBUMIN 3.7 04/06/2023    GLUCOSE 93 04/06/2023    ESTIMATEDGFR 8 04/06/2023    WBC 9.8 04/06/2023    HGB 10.1 (L) 04/06/2023    HCT 31.0 (L) 04/06/2023    PLATELETCNT 271 04/06/2023    SATURATION 12 (L) 06/30/2022    FERRITIN 685.8 (H) 06/30/2022    PROTCREATRAT 0.3 (H) 08/16/2022    PTHINTACT 228.5 (H) 08/16/2022    PHOSPHORUS 5.1 (H) 03/21/2023    25HYDROXYVIT 36.7 08/16/2022     Lab Results   Component Value Date    URGLUCOSE 70 (A) 04/06/2023    URKETONE Negative 04/06/2023     URSPGRAVIT 1.020 04/06/2023    URBLOOD 1+ (A) 04/06/2023    URPH 8.5 04/06/2023    URPROTEIN 300 (A) 04/06/2023    URNITRITE Negative 04/06/2023    URLEUKOCYTE 250 (A) 04/06/2023    URWBC 21-50 (A) 04/06/2023    URRBC 11-20 (A) 04/06/2023    URBACTERIA None seen 04/06/2023     Assessment / Plan: This patient is a 38 y.o. male admitted after presenting with hypotension and pain over right pelvic renal allografts.    End stage renal disease. Hemodialysis was performed yesterday and will need to be continued thrice weekly in the inpatient and outpatient settings.  Hypotension. Intravenous saline bolus was administered in the emergency department and midodrine is being given.  Anemia. His hemoglobin is within target range for end stage renal disease at 10.1 grams/dL. Erythropoiesis stimulating agent and parenteral iron supplement are administered per protocol at the outpatient dialysis facility for treatment of anemia due to chronic kidney disease and iron deficiency.  Renal osteodystrophy. Shall follow serum calcium and phosphorus and treat for divalent ion imbalances as indicated.  Abdominal pain. Acute rejection of renal allografts may be cause for abdominal pain. Report from non-enhanced CT imaging of the abdomen and pelvis performed today is pending. If findings are suggestive of acute renal allograft rejection are present, treatment should be initiated with high dose glucocorticoid therapy. Treatment for suspected urinary tract infection is being administered using intravenous cefepime and vancomycin pending result from urine culture.  Medications. Please dose all medications for glomerular filtration rate less than 10 mL/min.      Electronically signed:   Claretta Fraise, M.D.  [facility]      Electronically signed by Claretta Fraise., MD at 04/06/2023 10:12 PM EST

## 2023-04-06 NOTE — H&P (Signed)
Formatting of this note is different from the original.    Hospital Medicine Admission History & Physical    Patient: Roberto Rice  DOB: 1985/10/24  MRN: [id number]  Date: 04/06/2023  Room: 05  /05  PCP: Domenic Polite, APRN (General)    Chief Complaint:     Chief Complaint   Patient presents with    Other     hypotension         HPI:     [name] is a 38 y.o. male with a past medical history as below who presents to [facility] ED with hypotension that started after his recent dialysis.  Patient also endorsing some dysuria.  Initial blood pressure 70/30.  He received sepsis fluid bolus in the ED.  Urinalysis concerning for UTI given his symptoms.  He was started on broad-spectrum antibiotics with vancomycin and cefepime.  He has been admitted for further treatment    *Pt has been discussed with ED provider and I have decided to admit for further treatment/work-up    Histories:     Past Medical History:     has a past medical history of Blood transfusion, without reported diagnosis, Clostridium difficile colitis (10/12/2022), Epigastric pain, Fistula, arteriovenous, acquired (CMS/HCC), Hemodialysis, Hepatitis C antibody positive in blood (11/08/2022), Hyperlipidemia (01/08/2023), Hypertension (01/08/2023), Kidney disease (approx 4 weeks), Kidney transplant rejection, Limb alert care status, Nausea and vomiting, unspecified vomiting type, and S/P dialysis catheter insertion (CMS/HCC).    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    Family History:     family history includes Hypertension in his father and mother.      Social History:    He  reports no  history of alcohol use.  He  reports current drug use. Frequency: 2.00 times per week. Drug: Marijuana.  He  reports that he has never smoked. He has never used smokeless tobacco.   Social History     Social History Narrative    Not on file     Allergies:    Nsaids (non-steroidal anti-inflammatory drug)    Home Meds:     Prior to Admission Medications   Prescriptions Last Dose Informant Patient Reported? Taking?   cholecalciferol, vitamin D3, (VITAMIN D3) Tab tablet   Yes No   Sig: Take 2,000 Units by mouth Every morning.   gabapentin (NEURONTIN) 100 mg capsule   Yes No   Sig: Take 100 mg by mouth Three times a day.   midodrine (PROAMATINE) 5 mg tablet   No No   Sig: Take 1 Tablet by mouth Three times a day for 30 days.   oxyCODONE (ROXICODONE) 10 mg immediate release tablet   Yes No   Sig: Take 10 mg by mouth Once daily as needed for Pain.   pantoprazole (PROTONIX) 40 mg DR tablet   No No   Sig: Take 1 Tablet by mouth Every 12 hours for 14 days.   predniSONE (DELTASONE) 5 mg tablet   No No   Sig: Take 1 Tablet by mouth Once Daily.   sucroferric oxyhydroxide (VELPHORO) 500 mg Chew   Yes No   Sig: Take 500 mg by mouth Three times a day. With meals   traMADoL (ULTRAM) 50  mg tablet   Yes No   Sig: Take 50 mg by mouth Three times a day as needed for Pain.     Facility-Administered Medications: None       ROS:     Constitutional: Fatigue  Respiratory: Negative for cough.    Cardiovascular: Negative for chest pain.   Gastrointestinal: Negative for abdominal pain.   GU: Dysuria  Skin: Negative for skin changes  Neurological: negative for weakness.     Physical Exam:   Patient Vitals for the past 24 hrs:   BP Temp Temp src Pulse Resp SpO2 Height Weight   04/06/23 0528 (!) 91/50 -- -- 85 16 96 % -- --   04/06/23 0518 (!) 94/58 -- -- -- 16 98 % -- --   04/06/23 0427 (!) 91/54 -- -- 106 16 98 % -- --   04/06/23 0327 (!) 92/55 -- -- 88 17 94 % -- --   04/06/23 0312 (!) 90/57 -- -- 95 (!) 11 97 % -- --   04/06/23 0300 (!) 96/58  -- -- 93 16 99 % -- --   04/06/23 0042 (!) 97/50 -- -- 92 (!) 21 98 % -- --   04/06/23 0026 100/51 99.3 F (37.4 C) Oral 96 16 99 % 5' 11 (180.3 cm) 86.1 kg (189 lb 13.1 oz)     Constitutional:       Ill-appearing  HENT:     NCAT  Eyes:      EOMI  Neck:     No JVD  Cardiovascular:      Tachycardic  Pulmonary:      No wheezes  Abdominal:      Suprapubic discomfort  Skin:     Warm and dry.   Neurological:      No focal deficit    Labs/Imaging:     Labs:  Recent Labs     04/06/23  0053   WBC 9.8   RBC 3.34*   HGB 10.1*   HCT 31.0*   MCV 92.9   PLATELETCNT 271   SODIUM 134*   POTASSIUM 3.5*   MAGNESIUM 1.9   CHLORIDE 95*   CO2 27   GLUCOSE 93   BUN 23   CREATININE 8.0*   CALCIUM 8.9   TBILIRUBIN 0.5   ALP 68   AST 13   ALBUMIN 3.7   INR 1.1   PROCALCITONI 0.97     Estimated Creatinine Clearance: 13.3 mL/min (A) (by C-G formula based on SCr of 8 mg/dL [facility])).     No results for input(s): TROPIHSBASE, TROPIHS1H, TROPIHS3H, CK in the last 72 hours.    GLUCs:  GLUCOSE FINGERSTICK (mg/dL)   Date Value   16/12/9602 94   03/20/2023 90   03/19/2023 89   03/19/2023 88   09/29/2022 91   09/29/2022 117 (H)     ABG's:   Recent Labs     04/06/23  0059   PHBLOOD 7.53*   PCO2ARTERIAL 36   PO2 99   HCO3 30.5*   O2SATURATION 99.2   MODE RA     Urinalysis:   No results found for this visit on 04/06/23 (from the past 72 hour(s)).    Imaging:   Personally Reviewed Images  XR Portable Chest   Final Result   IMPRESSION:    No acute findings.   THIS DOCUMENT HAS BEEN ELECTRONICALLY SIGNED BY PEYTON ROTEFF, MD ON 04/06/2023 01:18    AM       EKG:  Encounter  Date   12 Lead EKG - Emergency Department    Narrative                          [facility] ED                                           Test Date:    2023-01-08  [facility] Name:     Roberto Rice              Department:   EMERGENCY DEPARTMENT  Patient ID:   [id number]                   Room:           Gender:       Male                     Technician:   ab  DOB:           1985/05/29               Requested By: Pat Kocher   Order Number: [id number]                Reading MD:   Eveline Keto MD                                   Measurements  Intervals                              Axis            Rate:         80                       P:            53  PR:           166                      QRS:          -33  QRSD:         97                       T:            18  QT:           388                                      QTc:          448                                                                 Interpretive Statements  Sinus rhythm  Left axis deviation  Abnormal R-wave progression, early transition  Compared to  ECG 09/29/2022 00:54:52  Left-axis deviation now present  Electronically Signed On 01-08-2023 17:21:28 EDT by Eveline Keto MD       ASSESSMENT/PLAN:     Patient is a 38 y.o. year-old male admitted with Sepsis secondary to UTI (CMS/HCC)    Assessment:  Principal Problem:    Sepsis secondary to UTI (CMS/HCC)  Active Problems:    Renal transplant, status post    Hypertension    ESRD (end stage renal disease) on dialysis (CMS/HCC)    Plan:  #Sepsis secondary to UTI (CMS/HCC)  -S/p sepsis fluid bolus in the ED  -Vancomycin and cefepime  -Follow-up cultures  -De-escalate antibiotics when able    # Hypotension  -Likely related to dialysis and possibly sepsis  -Received IV fluids in the ED with improvement noted  -If BP worsening, will need to start Levophed  -Midodrine increased to 10 mg 3 times a day    # ESRD  -Nephrology consulted for HD    BMI: Body mass index is 26.47 kg/m.;overweight  VTE Prophylaxis: Heparin; d/c if Plts <50 OR acute bleed  Code Status: Full Code    Medications reviewed. Labs reviewed. Imaging Reviewed    Alfonso Ramus  04/06/2023  6:18 AM    *Parts of this note were dictated with speech recognition software. Minor errors in transcription may be present. Please call/message for corrections.   Electronically signed by Earlie Server, DO at  04/06/2023  6:20 AM EST

## 2023-04-06 NOTE — H&P (Signed)
 Formatting of this note is different from the original.    Hospital Medicine Admission History & Physical    Patient: Roberto Rice  DOB: 1985/10/24  MRN: [id number]  Date: 04/06/2023  Room: 05  /05  PCP: Roberto Rice (General)    Chief Complaint:     Chief Complaint   Patient presents with    Other     hypotension         HPI:     Roberto Rice is a 38 y.o. male with a past medical history as below who presents to [facility] ED with hypotension that started after his recent dialysis.  Patient also endorsing some dysuria.  Initial blood pressure 70/30.  He received sepsis fluid bolus in the ED.  Urinalysis concerning for UTI given his symptoms.  He was started on broad-spectrum antibiotics with vancomycin and cefepime.  He has been admitted for further treatment    *Pt has been discussed with ED provider and I have decided to admit for further treatment/work-up    Histories:     Past Medical History:     has a past medical history of Blood transfusion, without reported diagnosis, Clostridium difficile colitis (10/12/2022), Epigastric pain, Fistula, arteriovenous, acquired (CMS/HCC), Hemodialysis, Hepatitis C antibody positive in blood (11/08/2022), Hyperlipidemia (01/08/2023), Hypertension (01/08/2023), Kidney disease (approx 4 weeks), Kidney transplant rejection, Limb alert care status, Nausea and vomiting, unspecified vomiting type, and S/P dialysis catheter insertion (CMS/HCC).    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    Family History:     family history includes Hypertension in his father and mother.      Social History:    He  reports no  history of alcohol use.  He  reports current drug use. Frequency: 2.00 times per week. Drug: Marijuana.  He  reports that he has never smoked. He has never used smokeless tobacco.   Social History     Social History Narrative    Not on file     Allergies:    Nsaids (non-steroidal anti-inflammatory drug)    Home Meds:     Prior to Admission Medications   Prescriptions Last Dose Informant Patient Reported? Taking?   cholecalciferol, vitamin D3, (VITAMIN D3) Tab tablet   Yes No   Sig: Take 2,000 Units by mouth Every morning.   gabapentin (NEURONTIN) 100 mg capsule   Yes No   Sig: Take 100 mg by mouth Three times a day.   midodrine (PROAMATINE) 5 mg tablet   No No   Sig: Take 1 Tablet by mouth Three times a day for 30 days.   oxyCODONE (ROXICODONE) 10 mg immediate release tablet   Yes No   Sig: Take 10 mg by mouth Once daily as needed for Pain.   pantoprazole (PROTONIX) 40 mg DR tablet   No No   Sig: Take 1 Tablet by mouth Every 12 hours for 14 days.   predniSONE (DELTASONE) 5 mg tablet   No No   Sig: Take 1 Tablet by mouth Once Daily.   sucroferric oxyhydroxide (VELPHORO) 500 mg Chew   Yes No   Sig: Take 500 mg by mouth Three times a day. With meals   traMADoL (ULTRAM) 50  mg tablet   Yes No   Sig: Take 50 mg by mouth Three times a day as needed for Pain.     Facility-Administered Medications: None       ROS:     Constitutional: Fatigue  Respiratory: Negative for cough.    Cardiovascular: Negative for chest pain.   Gastrointestinal: Negative for abdominal pain.   GU: Dysuria  Skin: Negative for skin changes  Neurological: negative for weakness.     Physical Exam:   Patient Vitals for the past 24 hrs:   BP Temp Temp src Pulse Resp SpO2 Height Weight   04/06/23 0528 (!) 91/50 -- -- 85 16 96 % -- --   04/06/23 0518 (!) 94/58 -- -- -- 16 98 % -- --   04/06/23 0427 (!) 91/54 -- -- 106 16 98 % -- --   04/06/23 0327 (!) 92/55 -- -- 88 17 94 % -- --   04/06/23 0312 (!) 90/57 -- -- 95 (!) 11 97 % -- --   04/06/23 0300 (!) 96/58  -- -- 93 16 99 % -- --   04/06/23 0042 (!) 97/50 -- -- 92 (!) 21 98 % -- --   04/06/23 0026 100/51 99.3 F (37.4 C) Oral 96 16 99 % 5' 11 (180.3 cm) 86.1 kg (189 lb 13.1 oz)     Constitutional:       Ill-appearing  HENT:     NCAT  Eyes:      EOMI  Neck:     No JVD  Cardiovascular:      Tachycardic  Pulmonary:      No wheezes  Abdominal:      Suprapubic discomfort  Skin:     Warm and dry.   Neurological:      No focal deficit    Labs/Imaging:     Labs:  Recent Labs     04/06/23  0053   WBC 9.8   RBC 3.34*   HGB 10.1*   HCT 31.0*   MCV 92.9   PLATELETCNT 271   SODIUM 134*   POTASSIUM 3.5*   MAGNESIUM 1.9   CHLORIDE 95*   CO2 27   GLUCOSE 93   BUN 23   CREATININE 8.0*   CALCIUM 8.9   TBILIRUBIN 0.5   ALP 68   AST 13   ALBUMIN 3.7   INR 1.1   PROCALCITONI 0.97     Estimated Creatinine Clearance: 13.3 mL/min (A) (by C-G formula based on SCr of 8 mg/dL [facility])).     No results for input(s): TROPIHSBASE, TROPIHS1H, TROPIHS3H, CK in the last 72 hours.    GLUCs:  GLUCOSE FINGERSTICK (mg/dL)   Date Value   16/12/9602 94   03/20/2023 90   03/19/2023 89   03/19/2023 88   09/29/2022 91   09/29/2022 117 (H)     ABG's:   Recent Labs     04/06/23  0059   PHBLOOD 7.53*   PCO2ARTERIAL 36   PO2 99   HCO3 30.5*   O2SATURATION 99.2   MODE RA     Urinalysis:   No results found for this visit on 04/06/23 (from the past 72 hour(s)).    Imaging:   Personally Reviewed Images  XR Portable Chest   Final Result   IMPRESSION:    No acute findings.   THIS DOCUMENT HAS BEEN ELECTRONICALLY SIGNED BY Roberto ROTEFF, MD ON 04/06/2023 01:18    AM       EKG:  Encounter  Date   12 Lead EKG - Emergency Department    Narrative                          [facility] ED                                           Test Date:    2023-01-08  [facility] Name:     Roberto Rice              Department:   EMERGENCY DEPARTMENT  Patient ID:   [id number]                   Room:           Gender:       Male                     Technician:   ab  DOB:           1985/05/29               Requested By: Roberto Rice   Order Number: [id number]                Reading MD:   Eveline Keto MD                                   Measurements  Intervals                              Axis            Rate:         80                       P:            53  PR:           166                      QRS:          -33  QRSD:         97                       T:            18  QT:           388                                      QTc:          448                                                                 Interpretive Statements  Sinus rhythm  Left axis deviation  Abnormal R-wave progression, early transition  Compared to  ECG 09/29/2022 00:54:52  Left-axis deviation now present  Electronically Signed On 01-08-2023 17:21:28 EDT by Eveline Keto MD       ASSESSMENT/PLAN:     Patient is a 38 y.o. year-old male admitted with Sepsis secondary to UTI (CMS/HCC)    Assessment:  Principal Problem:    Sepsis secondary to UTI (CMS/HCC)  Active Problems:    Renal transplant, status post    Hypertension    ESRD (end stage renal disease) on dialysis (CMS/HCC)    Plan:  #Sepsis secondary to UTI (CMS/HCC)  -S/p sepsis fluid bolus in the ED  -Vancomycin and cefepime  -Follow-up cultures  -De-escalate antibiotics when able    # Hypotension  -Likely related to dialysis and possibly sepsis  -Received IV fluids in the ED with improvement noted  -If BP worsening, will need to start Levophed  -Midodrine increased to 10 mg 3 times a day    # ESRD  -Nephrology consulted for HD    BMI: Body mass index is 26.47 kg/m.;overweight  VTE Prophylaxis: Heparin; d/c if Plts <50 OR acute bleed  Code Status: Full Code    Medications reviewed. Labs reviewed. Imaging Reviewed    Roberto Rice  04/06/2023  6:18 AM    *Parts of this note were dictated with speech recognition software. Minor errors in transcription may be present. Please call/message for corrections.   Electronically signed by Earlie Server, DO at  04/06/2023  6:20 AM EST

## 2023-04-06 NOTE — ED Notes (Signed)
 Formatting of this note might be different from the original.  Flonnie Overman    Test Name: creat  Results:  8.0  04/06/2023  Time: 0145  Received from: [facility] Client  R/V by: Lendon Ka    Physician: Edmonia Caprio  04/06/2023  Time: 0147  Notified: yes   Electronically signed by Lyndee Hensen, RN at 04/06/2023  2:01 AM EST

## 2023-04-06 NOTE — ED Notes (Signed)
Formatting of this note might be different from the original.  Flonnie Overman    Test Name: creat  Results:  8.0  04/06/2023  Time: 0145  Received from: [facility] Client  R/V by: Lendon Ka    Physician: Edmonia Caprio  04/06/2023  Time: 0147  Notified: yes   Electronically signed by Lyndee Hensen, RN at 04/06/2023  2:01 AM EST

## 2023-04-06 NOTE — Progress Notes (Signed)
 Formatting of this note might be different from the original.  Admit early AM for possible sepsis.  This afternoon pt reports rectal bleeding.  Trend H&H, GI consult.  Protonix.  CT a/p is pending.    Electronically signed by Ginny Forth, MD at 04/06/2023  6:57 PM EST

## 2023-04-06 NOTE — Unmapped (Signed)
Formatting of this note is different from the original.  Nursing End of Shift Summary    Pertinent changes to patient conditions and/or care this shift: Bleeding from rectum, dizziness, low bp.      Vital Signs Weight: 86.1 kg (189 lb 13.1 oz) (04/06/23 0026)    Temp: 99 F (37.2 C)  Temp Source: Oral    Heart Rate:94    BP: (!) 84/42    Respirations: 18   Oxygen Saturation SpO2: 98 %  O2 Delivery: Room air  O2 Device: None (Room air)    Incentive Spirometry        Diet Diet Renal;Fluid 1500cc; Pro 80G; Low Phosphorus; NA 4000mg  (NAS); K 2000mg  ( )  One Time Message to Dietary    Intake/Output Totals  Intake/Output          04/05/23 0700 - 04/06/23 0659 (Not Admitted)     1610-9604 1900-0659 Total        Intake    I.V.  --  2102.13 2102.13    Total Intake -- 2102.13 2102.13      Output    Total Output -- -- --             Activity Activity: Up ad lib  Level of Assistance: Independent    Ambulation Attempts this Shift: Did not ambulate this shift    Telemetry     Telemetry Abnormalities No     Labs No results found for: FUNCTIONAL, PLTAGGREG    Lab Results   Component Value Date    RBC 3.34 (L) 04/06/2023    WBC 9.8 04/06/2023    HCT 31.0 (L) 04/06/2023    HGB 10.1 (L) 04/06/2023    PLATELETCNT 271 04/06/2023    MCH 30.2 04/06/2023    MCHC 32.5 04/06/2023    MCV 92.9 04/06/2023    MPV 7.6 04/06/2023     Lab Results   Component Value Date    ALBUMIN 3.7 04/06/2023    SODIUM 134 (L) 04/06/2023    POTASSIUM 3.5 (L) 04/06/2023    CHLORIDE 95 (L) 04/06/2023    CO2 27 04/06/2023    ANIONGAP 12 04/06/2023    GLUCOSE 93 04/06/2023    CREATININE 8.0 (HH) 04/06/2023    BUN 23 04/06/2023    CALCIUM 8.9 04/06/2023    PROTEINTOTAL 7.8 04/06/2023    TBILIRUBIN 0.5 04/06/2023    ALP 68 04/06/2023    AST 13 04/06/2023    ALTSGPT <3 (L) 04/06/2023    OSMOLALITY 272 04/06/2023    AGRATIO 0.9 04/06/2023    BC 3 (L) 04/06/2023    ESTIMATEDGFR 8 04/06/2023         Taking all meds Yes   Any PRN meds given? No    Current IV  Infusions     Diuretics      SDOH Screen Assessed? Not Assessed   Discharge Planning Home   Expected Discharge Date    Education Provided Other (Comment) Medications, fall risk, bleeding           Electronically signed by Burnetta Sabin, RN at 04/06/2023  6:58 PM EST

## 2023-04-06 NOTE — Consults (Signed)
Associated Order(s): IP CONSULT TO NEPHROLOGY  Formatting of this note is different from the original.  04/06/2023  Roberto Rice  31-May-1985    [facility]  Hospital Consultation    HPI: This patient is a 38 y.o. African-American male who was admitted today after being brought to the emergency department from his residence last night due to development of hypotension following outpatient dialysis performed yesterday. Nephrology consultation is being obtained for management of end stage renal disease and provision of hemodialysis.    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 Both kidneys were placed in his right pelvis.     He is awake, alert, and feels unwell with complaint of right lower quadrat abdominal pain over renal allografts.     Past Medical History:   Diagnosis Date    Blood transfusion, without reported diagnosis     Clostridium difficile colitis 10/12/2022    Epigastric pain     Fistula, arteriovenous, acquired (CMS/HCC)     has old fistula right arm, may be used for secondary fistula creation. 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 May be used for secondary fistula creation)    Nausea and vomiting, unspecified vomiting type     S/P dialysis catheter insertion (CMS/HCC)     chest     Past Surgical History:   Procedure Laterality Date    AV  FISTULOGRAM N/A 10/04/2011    AV FISTULOGRAM performed by Audelia Acton, MD at [facility] VASCULAR LABS    EMBOLIZATION Right 10/04/2011    EMBOLIZATION performed by Audelia Acton, MD at [facility] VASCULAR LABS    HX ARTERIO-VENOUS FISTULA Right 08/16/2011    ARTERIO-VENOUS FISTULA performed by Audelia Acton, MD at [facility] CVOR    HX BROKEN/FX BONES      lt ankle ten pins and a plate    HX COLONOSCOPY N/A 07/01/2022    COLONOSCOPY /C ARGON PLASMA COAGULATION performed by Princess Bruins, MD at [facility] MAIN OR    HX EGJ N/A 12/03/2022    EGD /C BIOPSY performed by Josetta Huddle, MD at [facility] ENDO    HX OTHER SURGICAL HISTORY      tunnel cath 06/2011 [facility]    HX ULTRASOUND, INTRAOPERATIVE Right 08/16/2011    ULTRASOUND, INTRAOPERATIVE performed by Audelia Acton, MD at [facility] CVOR    TUNNEL CATH PLACEMENT N/A 08/02/2011    TUNNEL CATH PLACEMENT performed by Audelia Acton, MD at [facility] VASCULAR LABS    VENOUS PTA Right 10/04/2011    VENOUS PTA performed by Audelia Acton, MD at [facility] VASCULAR LABS     Family History   Problem Relation Name Age of Onset    Hypertension Mother      Hypertension Father       Current Facility-Administered Medications  on File Prior to Encounter   Medication Dose Route Frequency Provider Last Rate Last Admin    [COMPLETED] pantoprazole (PROTONIX) injection 80 mg  80 mg Intravenous ONE TIME Rice, Kylie, DO   80 mg at 03/20/23 0914    [COMPLETED] pantoprazole (PROTONIX) injection 40 mg  40 mg Intravenous ONE TIME [name], MD   40 mg at 03/19/23 2010     Current Outpatient Medications on File Prior to Encounter   Medication Sig Dispense Refill    predniSONE (DELTASONE) 5 mg tablet Take 1 Tablet by mouth Once Daily.      midodrine (PROAMATINE) 5 mg tablet Take 1 Tablet by mouth Three times a day for 30 days. 90 Tablet 0    pantoprazole (PROTONIX) 40 mg DR tablet Take 1 Tablet by mouth Every 12 hours for 14 days. 28 Tablet 0    gabapentin (NEURONTIN) 100 mg capsule Take 100 mg by mouth Three times a day.      oxyCODONE  (ROXICODONE) 10 mg immediate release tablet Take 10 mg by mouth Once daily as needed for Pain.      traMADoL (ULTRAM) 50 mg tablet Take 50 mg by mouth Three times a day as needed for Pain.      sucroferric oxyhydroxide (VELPHORO) 500 mg Chew Take 500 mg by mouth Three times a day. With meals      cholecalciferol, vitamin D3, (VITAMIN D3) Tab tablet Take 2,000 Units by mouth Every morning.       Allergies   Allergen Reactions    Nsaids (Non-Steroidal Anti-Inflammatory Drug) Other (See Comments)     CANNOT TAKE DUE TO KIDNEY FAILURE   CANNOT TAKE DUE TO KIDNEY FAILURE       REVIEW OF SYSTEMS: all systems reviewed and negative except HPI and the following systems:   Constitutional: Denies experiencing fevers, rigors, or sweats. Significant for weakness and malaise I the setting of the present illness.  Pulmonary: No cough, shortness of breath, or pleuritic chest pain reported.  Cardiac: No chest pain or palpitations reported.  Gastrointestinal: Denies experiencing nausea, vomiting, diarrhea, melena, or hematochezia.  Genitourinary: Denies experiencing dysuria, hematuria, or polyuria. He has been experiencing right lower quadrant pain.  Hematologic: Denies experiencing bleeding diathesis.   Endocrine: No symptoms of glucose intolerance, adrenal disease, hypothyroidism, or hyperthyroidism reported   Musculoskeletal: No new myalgias or arthralgias reported.   Neurologic: No headaches reported or symptoms of cerebral ischemia elicited.   Psychiatric: No visual nor auditory hallucinations endorsed.     PHYSICAL EXAMINATION   Vitals:    04/06/23 1230 04/06/23 1659 04/06/23 1700 04/06/23 1948   BP: 100/52 (!) 81/41 (!) 84/42 102/57   BP Location:  Right Upper Extremity Right Upper Extremity Right Upper Extremity   Patient Position:  Lying Left Side Lying Left Side Semi Fowlers   Pulse: 84 94  76   Resp: 13 18  17    Temp:  99 F (37.2 C)  98.3 F (36.8 C)   TempSrc:  Oral  Oral   SpO2: 99% 98%  99%   Weight:       Height:          Wt Readings from Last 3 Encounters:   04/06/23 86.1 kg (189 lb 13.1 oz)   03/21/23 82.2 kg (181 lb 4.8 oz)   03/13/23 83.9 kg (185 lb)     GENERAL: Alert and oriented. Well developed, well nourished, responds appropriately to questions. Fonzo is lying on a stretcher in the emergency  department and experiencing no acute distress.   EENMT: Head is normocephalic, atraumatic. Sclerae are anicteric. Mucous membranes moist. No thrush or ulcers are present on his oral mucosa.   NECK: Supple, full range of motion. No jugular venous distention or lymphadenopathy is present and no carotid bruits are audible. Tunneled left internal jugular venous hemodialysis catheter is in place.  CARDIOVASCULAR: Regular rhythm. Normal S1 and S2 heart sounds. No pericardial friction rub is audible.   LUNGS: Clear to auscultation bilaterally. Respirations unlabored.  GI/ABDOMEN: Soft, non-tender, and non-distended. No rebound, rigidity, or guarding is present. Positive bowel sounds in all four quadrants.   MSK/EXTREMITIES: No clubbing or cyanosis. Negative calf pain. There is no peripheral edema.  NEUROLOGIC: No myoclonic jerking observed.   SKIN: Warm and dry. No lesions or rashes are present.     Labs:   Lab Results   Component Value Date    BUN 23 04/06/2023    CREATININE 8.0 (HH) 04/06/2023    MAGNESIUM 1.9 04/06/2023    POTASSIUM 3.5 (L) 04/06/2023    SODIUM 134 (L) 04/06/2023    CHLORIDE 95 (L) 04/06/2023    CO2 27 04/06/2023    CALCIUM 8.9 04/06/2023    ALBUMIN 3.7 04/06/2023    GLUCOSE 93 04/06/2023    ESTIMATEDGFR 8 04/06/2023    WBC 9.8 04/06/2023    HGB 10.1 (L) 04/06/2023    HCT 31.0 (L) 04/06/2023    PLATELETCNT 271 04/06/2023    SATURATION 12 (L) 06/30/2022    FERRITIN 685.8 (H) 06/30/2022    PROTCREATRAT 0.3 (H) 08/16/2022    PTHINTACT 228.5 (H) 08/16/2022    PHOSPHORUS 5.1 (H) 03/21/2023    25HYDROXYVIT 36.7 08/16/2022     Lab Results   Component Value Date    URGLUCOSE 70 (A) 04/06/2023    URKETONE Negative 04/06/2023     URSPGRAVIT 1.020 04/06/2023    URBLOOD 1+ (A) 04/06/2023    URPH 8.5 04/06/2023    URPROTEIN 300 (A) 04/06/2023    URNITRITE Negative 04/06/2023    URLEUKOCYTE 250 (A) 04/06/2023    URWBC 21-50 (A) 04/06/2023    URRBC 11-20 (A) 04/06/2023    URBACTERIA None seen 04/06/2023     Assessment / Plan: This patient is a 38 y.o. male admitted after presenting with hypotension and pain over right pelvic renal allografts.    End stage renal disease. Hemodialysis was performed yesterday and will need to be continued thrice weekly in the inpatient and outpatient settings.  Hypotension. Intravenous saline bolus was administered in the emergency department and midodrine is being given.  Anemia. His hemoglobin is within target range for end stage renal disease at 10.1 grams/dL. Erythropoiesis stimulating agent and parenteral iron supplement are administered per protocol at the outpatient dialysis facility for treatment of anemia due to chronic kidney disease and iron deficiency.  Renal osteodystrophy. Shall follow serum calcium and phosphorus and treat for divalent ion imbalances as indicated.  Abdominal pain. Acute rejection of renal allografts may be cause for abdominal pain. Report from non-enhanced CT imaging of the abdomen and pelvis performed today is pending. If findings are suggestive of acute renal allograft rejection are present, treatment should be initiated with high dose glucocorticoid therapy. Treatment for suspected urinary tract infection is being administered using intravenous cefepime and vancomycin pending result from urine culture.  Medications. Please dose all medications for glomerular filtration rate less than 10 mL/min.      Electronically signed:   Claretta Fraise, M.D.  [facility]      Electronically signed by Claretta Fraise., MD at 04/06/2023 10:12 PM EST

## 2023-04-06 NOTE — Unmapped (Signed)
Formatting of this note might be different from the original.  Bedside shift report/handoff given to [facility] by Burnetta Sabin, RN.  Patient resting quietly at this time without complaints.    Electronically signed by Burnetta Sabin, RN at 04/06/2023  7:23 PM EST

## 2023-04-06 NOTE — Progress Notes (Signed)
Formatting of this note might be different from the original.  Admit early AM for possible sepsis.  This afternoon pt reports rectal bleeding.  Trend H&H, GI consult.  Protonix.  CT a/p is pending.    Electronically signed by Ginny Forth, MD at 04/06/2023  6:57 PM EST

## 2023-04-06 NOTE — ED Notes (Signed)
Formatting of this note might be different from the original.  Pt received of NS  Electronically signed by Maxine Glenn, RN at 04/06/2023  2:43 AM EST

## 2023-04-06 NOTE — Unmapped (Signed)
Formatting of this note might be different from the original.    Problem: Discharge Planning  Goal: Knowledge of discharge instructions  Description: Interventions:  - Education, renal diet  - Medication reconciliation  - Education, post discharge follow up  - Education, when to call provider  Outcome: Ongoing    Problem: Fluid Volume, Excess  Goal: Maintain appropriate body weight  Description: Instructions:  - Fluid restriction  - Education, fluid restriction  - Intake and output measurement  - Daily weight  Outcome: Ongoing    Problem: Electrolyte Imbalance, Risk for (Renal Faliure)  Goal: Maintain a normal sinus heart rhythm with regular rate  Description: Interventions:  - Vitals/O2 sat/telemetry monitoring  Outcome: Ongoing  Goal: Maintain normal serum electrolyte levels  Description: Interventions:  - Monitor laboratory data as ordered and report deviations to provider  Outcome: Ongoing    Problem: Infection, Risk for Central Venous Catheter Associated Bloodstream Infection  Goal: Absence of central venous catheter-associated bloodstream infection  Description: Interventions:  - Optimal catheter site selection, with avoidance of the femoral vein for central venous access in adult patients  - Maximal barrier precautions upon insertion  - Hand hygiene  - Chlorhexidine skin antisepsis  - Central line needs assessment  Outcome: Ongoing    Problem: Coping, Ineffective  Goal: Effective coping  Description: Interventions:  - Ineffective coping signs and symptoms assessment  - Consult to social services  - Cognitive emotional support  - Provide continuum of care to patient and family  - Education, healthy daily routines  - Education, disease process  Outcome: Ongoing    Problem: Skin Integrity, Impaired, Risk for  Goal: Absence of pressure injury  Description: Interventions:  - Skin assessment  - Patient repositioning every 2 hours if decreased mobility  Outcome: Ongoing    Problem: Infection, Risk for  Goal: Patient  will be free from infection  Description: Interventions:  1. Monitor for signs and symptoms of infection: fever, increased WBC, burning with urination or chills  2. Monitor insertion site for redness, tenderness, or drainage  3. Hand hygiene per CDC guidelines  4. Discontinuation of invasive devices when not needed  5. Place patient in appropriate isolation  Outcome: Ongoing    Problem: Pain, Acute  Goal: Communication of presence of pain  Description: Interventions:  - Nonpharmacologic pain management  - Medication administration  - Education, pain scale  Outcome: Ongoing    Problem: Deep Venous Thrombosis, Risk of  Goal: Absence of deep venous thrombosis  Description: Interventions:  - Deep venous thrombosis risk assessment  - Ensure patient is receiving antithrombotic medication for VTE prophylaxis  - Activity promotion  - Graduated anti-embolic stocking management  - Intermittent pneumatic compression management.  Outcome: Ongoing    Electronically signed by Burnetta Sabin, RN at 04/06/2023  3:19 PM EST

## 2023-04-06 NOTE — Unmapped (Signed)
 Formatting of this note might be different from the original.  Bedside shift report/handoff given to [facility] by Burnetta Sabin, RN.  Patient resting quietly at this time without complaints.    Electronically signed by Burnetta Sabin, RN at 04/06/2023  7:23 PM EST

## 2023-04-06 NOTE — ED Provider Notes (Signed)
Formatting of this note is different from the original.  Roberto Rice, Roberto Rice [[id number]] [facility]) - 38 y.o.  Note Creation:04/06/2023  Encounter Date:04/06/2023      History     Chief Complaint   Patient presents with    Other     hypotension     Roberto Rice is a 38 y.o. male with hx of HLD, HTN, CKD who presents to the ED with c/o hypotension and fatigue that began today following dialysis treatment.  Pt reports since his dialysis this morning he has experienced moderate fatigue. EMS reports upon arrival on scene BP was recorded at 70/30. En route EMS administered 200 L of fluid. Upon arrival to ED patient's BP was recorded 100/51. Pt does state he does produce some urine and complains of dysuria. He denies CP, SOB, HA, N/V/D, fever or any other pertinent symptoms or concerns. He denies any other aggravating or alleviating factors. He denies any other pertinent PMHx.    The history is provided by the patient, the EMS personnel and medical records.     Past Medical History:   Diagnosis Date    Blood transfusion, without reported diagnosis     Clostridium difficile colitis 10/12/2022    Epigastric pain     Fistula, arteriovenous, acquired (CMS/HCC)     has old fistula right arm, may be used for secondary fistula creation. 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 May be used for secondary fistula creation)    Nausea and vomiting, unspecified vomiting type     S/P dialysis catheter insertion (CMS/HCC)     chest     Past Surgical History:   Procedure Laterality Date    AV FISTULOGRAM N/A 10/04/2011    AV FISTULOGRAM performed by Audelia Acton, MD at [facility] VASCULAR LABS    EMBOLIZATION Right 10/04/2011     EMBOLIZATION performed by Audelia Acton, MD at [facility] VASCULAR LABS    HX ARTERIO-VENOUS FISTULA Right 08/16/2011    ARTERIO-VENOUS FISTULA performed by Audelia Acton, MD at [facility] CVOR    HX BROKEN/FX BONES      lt ankle ten pins and a plate    HX COLONOSCOPY N/A 07/01/2022    COLONOSCOPY /C ARGON PLASMA COAGULATION performed by Princess Bruins, MD at [facility] MAIN OR    HX EGJ N/A 12/03/2022    EGD /C BIOPSY performed by Josetta Huddle, MD at [facility] ENDO    HX OTHER SURGICAL HISTORY      tunnel cath 06/2011 [facility]    HX ULTRASOUND, INTRAOPERATIVE Right 08/16/2011    ULTRASOUND, INTRAOPERATIVE performed by Audelia Acton, MD at [facility] CVOR    TUNNEL CATH PLACEMENT N/A 08/02/2011    TUNNEL CATH PLACEMENT performed by Audelia Acton, MD at [facility] VASCULAR LABS    VENOUS PTA Right 10/04/2011    VENOUS PTA performed by Audelia Acton, MD at [facility] VASCULAR LABS     Family History   Problem Relation Name Age of Onset    Hypertension Mother      Hypertension Father  Social History     Tobacco Use    Smoking status: Never    Smokeless tobacco: Never   Vaping Use    Vaping status: Never Used   Substance Use Topics    Alcohol use: No    Drug use: Yes     Frequency: 2.0 times per week     Types: Marijuana     Patient is not a tobacco user.    No LMP for male patient.    Allergies   Allergen Reactions    Nsaids (Non-Steroidal Anti-Inflammatory Drug) Other (See Comments)     CANNOT TAKE DUE TO KIDNEY FAILURE   CANNOT TAKE DUE TO KIDNEY FAILURE       Current Outpatient Medications on File Prior to Encounter   Medication Sig    predniSONE (DELTASONE) 5 mg tablet Take 1 Tablet by mouth Once Daily.    midodrine (PROAMATINE) 5 mg tablet Take 1 Tablet by mouth Three times a day for 30 days.    pantoprazole (PROTONIX) 40 mg DR tablet Take 1 Tablet by mouth Every 12 hours for 14 days.    gabapentin (NEURONTIN) 100 mg capsule Take 100 mg by mouth Three times a day.    oxyCODONE (ROXICODONE) 10 mg immediate release tablet Take 10 mg by mouth Once daily as  needed for Pain.    traMADoL (ULTRAM) 50 mg tablet Take 50 mg by mouth Three times a day as needed for Pain.    sucroferric oxyhydroxide (VELPHORO) 500 mg Chew Take 500 mg by mouth Three times a day. With meals    cholecalciferol, vitamin D3, (VITAMIN D3) Tab tablet Take 2,000 Units by mouth Every morning.       Review of Systems     Review of Systems   Constitutional:  Positive for fatigue. Negative for fever.   HENT:  Negative for facial swelling.    Eyes:  Negative for visual disturbance.   Respiratory:  Negative for cough and shortness of breath.    Cardiovascular:  Negative for chest pain.        Hypotension    Gastrointestinal:  Negative for abdominal pain.   Endocrine: Negative for cold intolerance and heat intolerance.   Genitourinary:  Positive for dysuria.   Musculoskeletal:  Negative for arthralgias.   Skin:  Negative for rash.   Allergic/Immunologic: Negative for immunocompromised state.   Neurological:  Positive for weakness. Negative for dizziness and headaches.   Hematological:  Does not bruise/bleed easily.   Psychiatric/Behavioral:  Negative for suicidal ideas.      Physical Exam     ED Triage Vitals   BP BP Manual or Automatic? Patient Position BP Location Heart Rate (Monitor)   04/06/23 0026 04/06/23 0026 04/06/23 0026 -- 04/06/23 0042   100/51 Automatic Semi Fowlers  92     Pulse Pulse Source Respirations Temp Temp Source   04/06/23 0026 -- 04/06/23 0026 04/06/23 0026 04/06/23 0026   96  16 99.3 F (37.4 C) Oral     SpO2 SPO2 Location O2 Delivery O2 Device O2 Flow Rate (l/min)   04/06/23 0026 -- 04/06/23 0026 -- --   99 %  Room air       FIO2 (%) Pain Intensity 1 Exacerbated By Relieved By Quality   -- -- -- -- --       Duration       --           Physical Exam  Vitals and nursing note reviewed.  Constitutional:       General: He is not in acute distress.     Appearance: He is well-developed. He is not diaphoretic.   HENT:      Head: Normocephalic and atraumatic.   Eyes:      General:          Right eye: No discharge.         Left eye: No discharge.      Pupils: Pupils are equal, round, and reactive to light.   Cardiovascular:      Rate and Rhythm: Normal rate and regular rhythm.      Heart sounds: Normal heart sounds. No murmur heard.     No friction rub. No gallop.   Pulmonary:      Effort: Pulmonary effort is normal. No respiratory distress.      Breath sounds: Normal breath sounds.   Chest:      Comments: Dialysis catheter in place L chest wall    Abdominal:      General: There is no distension.      Palpations: Abdomen is soft.      Tenderness: There is no abdominal tenderness.   Musculoskeletal:         General: No deformity.      Cervical back: Normal range of motion and neck supple.      Comments: Dialysis fistula present in L AC   Skin:     General: Skin is warm and dry.      Findings: No erythema or rash.   Neurological:      Mental Status: He is alert and oriented to person, place, and time.      Cranial Nerves: No cranial nerve deficit.      Sensory: No sensory deficit.      Motor: No weakness.     MDM         Treatment:  Procedures    Medications   -PHARMACY TO DOSE CEFEPIME (MAXIPIME)- 1 Each (has no administration in time range)   -PHARMACY TO DOSE VANCOMYCIN (VANCOCIN)- 1 Each (has no administration in time range)   midodrine (PROAMATINE) tab 10 mg (has no administration in time range)   gabapentin (NEURONTIN) cap 100 mg (has no administration in time range)   oxyCODONE (ROXICODONE) immediate release tab 10 mg (has no administration in time range)   traMADoL (ULTRAM) tab 50 mg (has no administration in time range)   NS (sodium chloride 0.9%) IV bolus (0 mL Intravenous Stopped 04/06/23 0242)   cefepime (MAXIPIME) injection 2 g (2 g Intravenous Given 04/06/23 0123)   vancomycin (VANCOCIN) 2g in NS (sodium chloride 0.9%) (REMOTE STOCK) 2 g (0 g Intravenous Stopped 04/06/23 0345)   hePARin (porcine) 5,000 unit/mL injection for catheter 2 mL (2 mL Catheter Given 04/06/23 0102)     Results for  orders placed or performed during the hospital encounter of 04/06/23   SARS Cov-2/Influ A+B/RSV RNA    Specimen: Nasopharyngeal Swab   Result Value Ref Range    RSV RNA NEGATIVE Negative    Influenza A NEGATIVE Negative    Influenza B NEGATIVE Negative    SARS-CoV-2 RNA Undetected    CBC w/Differential   Result Value Ref Range    WBC 9.8 4.5 - 11.0 10*3/uL    RBC 3.34 (L) 4.50 - 5.90 10*6/uL    HGB 10.1 (L) 13.5 - 17.5 g/dL    HCT 16.1 (L) 09.6 - 53.0 %    MCV 92.9 80.0 - 100.0  fL    MCHC 32.5 32.0 - 36.0 g/dL    MCH 25.3 66.4 - 40.3 pg    RDW 17.2 10.7 - 18.7 %    MPV 7.6 6.5 - 10.0 fL    Platelet Cnt 271 150 - 450 10*3/uL    Differential Type Auto     Neutrophils 69.7 (H) 35.0 - 66.0 %    Lymphocytes 13.4 (L) 24.0 - 44.0 %    Monocytes 9.4 2.1 - 13.3 %    Eosinophils 6.7 (H) 0.3 - 5.0 %    Basophils 0.8 0.0 - 1.0 %    Neutrophils Abs 6.8 1.5 - 8.5 10*3/uL    Lymphocytes Abs 1.3 1.1 - 5.0 10*3/uL    Monocytes Abs 0.9 0.0 - 1.4 10*3/uL    Eosinophils Abs 0.7 (H) 0.0 - 0.5 10*3/uL    Basophils Abs 0.1 0.0 - 0.1 10*3/uL    MDW 19.6 0.0 - 20.0   Comprehensive Metabolic Panel   Result Value Ref Range    SODIUM 134 (L) 135 - 145 mmol/L    POTASSIUM 3.5 (L) 3.6 - 5.0 mmol/L    CHLORIDE 95 (L) 101 - 111 mmol/L    CO2 27 21 - 31 mmol/L    ANION GAP 12     GLUCOSE 93 70 - 110 mg/dL    CREATININE 8.0 (HH) 0.6 - 1.2 mg/dL    BUN 23 2 - 32 mg/dL    CALCIUM 8.9 8.5 - 47.4 mg/dL    PROTEIN TOTAL 7.8 6.1 - 7.8 g/dL    Albumin 3.7 3.2 - 5.0 g/dL    T BILIRUBIN 0.5 0.2 - 1.0 mg/dL    ALP 68 42 - 259 [iU]/L    AST 13 10 - 42 [iU]/L    ALT (SGPT) <3 (L) 10 - 60 [iU]/L    OSMOLALITY 272 266 - 309    A/G Ratio 0.9     B/C 3 (L) 10 - 20    ESTIMATED GFR 8 mL/min   Procalcitonin, QN, S   Result Value Ref Range    PROCALCITONIN, QN, S 0.97 ng/mL   Lactic Acid, Venous   Result Value Ref Range    LACTIC ACID 1.1 0.5 - 1.9 mmol/L   UA for Infection (Reflex Culture)    Specimen: Clean Catch Urine   Result Value Ref Range    UR GLUCOSE 70 (A)  NEGATIVE mg/dL    UR BILIRUBIN Negative NEGATIVE mg/dL    UR KETONE Negative NEGATIVE mg/dL    UR SP GRAVITY 5.638 1.005 - 1.030    UR BLOOD 1+ (A) NEGATIVE mg/dL    UR PH 8.5 5.0 - 9.0    UR PROTEIN 300 (A) NEGATIVE mg/dL    UR UROBILINOGEN <7.5 <2.0    UR NITRITE Negative NEGATIVE mg/dL    UR LEUKOCYTE 643 (A) NEGATIVE    UR COLOR Yellow YELLOW    UR CLARITY Hazy CLEAR    UR WBC 21-50 (A) 1 - 3 [HPF]    UR RBC 11-20 (A) 1 - 3 [HPF]    UR SQUAMOUS EPI 4-5 3 - 5 [HPF]    UR MUCOUS Rare NONE SEEN [HPF]    UR BACTERIA None seen NONE SEEN [HPF]   Lipase   Result Value Ref Range    LIPASE 61 11 - 82 U/L   Magnesium   Result Value Ref Range    MAGNESIUM 1.9 1.7 - 2.8 mg/dL   PT/APTT/INR   Result Value Ref  Range    PROTIME 13.0 10.1 - 13.7 s    INR 1.1 0.9 - 1.1    APTT 31.5 24.2 - 34.2 s   RT Blood Gases   Result Value Ref Range    PH BLOOD 7.53 (H) 7.35 - 7.45    PCO2 ARTERIAL 36 35 - 45 mm[Hg]    PO2 99 80 - 100 mm[Hg]    HCO3 30.5 (H) 22.0 - 28.0 mmol/L    BASE EXCESS 7.1 mmol/L    %O2 SATURATION 99.2 95.0 - 100.0 %    DRAW SITE RT Radial     MODE RA        Results           XR Portable Chest (Final result)  Result time 04/06/23 01:19:06      Final result              Impression:    IMPRESSION:   No acute findings.  THIS DOCUMENT HAS BEEN ELECTRONICALLY SIGNED BY PEYTON ROTEFF, MD ON 04/06/2023 01:18   AM            Narrative:    PROCEDURE INFORMATION:   Exam: XR Chest   Exam date and time: 04/06/2023 1:01 AM   Age: 38 years old   Clinical indication: Shortness of breath; Additional info: Other   TECHNIQUE:   Imaging protocol: Radiologic exam of the chest.   Views: 1 view.   COMPARISON:   CR XR PORTABLE CHEST 01/10/2023 3:53 PM   FINDINGS:   Tubes, catheters and devices: Left internal jugular catheter with the tip   projecting over the right atrium.   Lungs: Unremarkable. No consolidation.   Pleural spaces: Unremarkable. No pleural effusion. No pneumothorax.   Heart/Mediastinum: Unremarkable. No cardiomegaly.    Bones/joints: Unremarkable.                 Preliminary result              Impression:    This exam has been sent to [facility] for reading.  The final report is not yet available.                         EKG Seen by me:  Time & Date: 0052 and 04/06/2023  Interpretation: sinus rhythm  HR: 91 beats per minute  No STEMI    Consult:  0324: I spoke with Dr. Sullivan Lone, Hospitalist, about the pt's history of present illness, physical examination and course in the ED. Dr. Sullivan Lone accepts pt for admission.    Plan:    [facility] ED RECHECK: Admit: The pt is awake and alert at time of reevaluation. I spoke with the patient about his ED work up and diagnosis. I informed the patient that he will be admitted for further evaluation/treatment. All questions answered. The pt is agreeable.     Medical Decision Making  Concern for sepsis given dysuria and hypotension. Improved with fluids. Admitted for obs.     Amount and/or Complexity of Data Reviewed  Labs: ordered.  Radiology: ordered.    Risk  Prescription drug management.  Decision regarding hospitalization.      Progress Note:        ED Prescriptions    None      Final diagnoses:   Hypotension   UTI (urinary tract infection)       ED Disposition       ED Disposition   Admitted    Condition   --  Comment   Bed Reason: Medical Necessity [2]            Scribe Attestation:  Lysle Dingwall acting as a Neurosurgeon for and in the presence of Mayo, John, DO  Electronically signed by Lysle Dingwall 04/06/23 12:38 AM     Provider Attestation:  I personally performed the services described in the documentation, reviewed the documentation recorded by the scribe in my presence and it accurately and completely records my words and actions.   Electronically Signed By Warren Danes, DO 04/06/2023 6:17 AM    Electronically signed by Warren Danes, DO at 04/06/2023  6:17 AM EST

## 2023-04-06 NOTE — Progress Notes (Signed)
Formatting of this note might be different from the original.  Patient admitted to room [phone] from ER.  Patient is alert and oriented x 3.  Oriented to room and call light.  Call light within easy reach.  Patient educated on fall precautions and pain scale.  Encouraged to call for assistance before getting out of bed.  Gripper socks given.  No wants or needs at this time.    Electronically signed by Burnetta Sabin, RN at 04/06/2023  1:12 PM EST

## 2023-04-06 NOTE — Progress Notes (Signed)
 Formatting of this note might be different from the original.  Went in patient room to administer medications.  Patient said that he wanted to talk to me.  Said he went to the restroom and left it in the toilet so I could see it.  There was a lot of blood.  Patient stated that this has been ongoing since October, even being admitted for it.  But when colonoscopy was done, a bleed could not be found.  Charge nurse was notified.  Electronically signed by Burnetta Sabin, RN at 04/06/2023  4:22 PM EST

## 2023-04-06 NOTE — ED Provider Notes (Signed)
 Formatting of this note is different from the original.  Roberto Rice, Roberto Rice [[id number]] [facility]) - 38 y.o.  Note Creation:04/06/2023  Encounter Date:04/06/2023      History     Chief Complaint   Patient presents with    Other     hypotension     Roberto Rice is a 38 y.o. male with hx of HLD, HTN, CKD who presents to the ED with c/o hypotension and fatigue that began today following dialysis treatment.  Pt reports since his dialysis this morning he has experienced moderate fatigue. EMS reports upon arrival on scene BP was recorded at 70/30. En route EMS administered 200 L of fluid. Upon arrival to ED patient's BP was recorded 100/51. Pt does state he does produce some urine and complains of dysuria. He denies CP, SOB, HA, N/V/D, fever or any other pertinent symptoms or concerns. He denies any other aggravating or alleviating factors. He denies any other pertinent PMHx.    The history is provided by the patient, the EMS personnel and medical records.     Past Medical History:   Diagnosis Date    Blood transfusion, without reported diagnosis     Clostridium difficile colitis 10/12/2022    Epigastric pain     Fistula, arteriovenous, acquired (CMS/HCC)     has old fistula right arm, may be used for secondary fistula creation. 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 May be used for secondary fistula creation)    Nausea and vomiting, unspecified vomiting type     S/P dialysis catheter insertion (CMS/HCC)     chest     Past Surgical History:   Procedure Laterality Date    AV FISTULOGRAM N/A 10/04/2011    AV FISTULOGRAM performed by Audelia Acton, MD at [facility] VASCULAR LABS    EMBOLIZATION Right 10/04/2011     EMBOLIZATION performed by Audelia Acton, MD at [facility] VASCULAR LABS    HX ARTERIO-VENOUS FISTULA Right 08/16/2011    ARTERIO-VENOUS FISTULA performed by Audelia Acton, MD at [facility] CVOR    HX BROKEN/FX BONES      lt ankle ten pins and a plate    HX COLONOSCOPY N/A 07/01/2022    COLONOSCOPY /C ARGON PLASMA COAGULATION performed by Princess Bruins, MD at [facility] MAIN OR    HX EGJ N/A 12/03/2022    EGD /C BIOPSY performed by Josetta Huddle, MD at [facility] ENDO    HX OTHER SURGICAL HISTORY      tunnel cath 06/2011 [facility]    HX ULTRASOUND, INTRAOPERATIVE Right 08/16/2011    ULTRASOUND, INTRAOPERATIVE performed by Audelia Acton, MD at [facility] CVOR    TUNNEL CATH PLACEMENT N/A 08/02/2011    TUNNEL CATH PLACEMENT performed by Audelia Acton, MD at [facility] VASCULAR LABS    VENOUS PTA Right 10/04/2011    VENOUS PTA performed by Audelia Acton, MD at [facility] VASCULAR LABS     Family History   Problem Relation Name Age of Onset    Hypertension Mother      Hypertension Father  Social History     Tobacco Use    Smoking status: Never    Smokeless tobacco: Never   Vaping Use    Vaping status: Never Used   Substance Use Topics    Alcohol use: No    Drug use: Yes     Frequency: 2.0 times per week     Types: Marijuana     Patient is not a tobacco user.    No LMP for male patient.    Allergies   Allergen Reactions    Nsaids (Non-Steroidal Anti-Inflammatory Drug) Other (See Comments)     CANNOT TAKE DUE TO KIDNEY FAILURE   CANNOT TAKE DUE TO KIDNEY FAILURE       Current Outpatient Medications on File Prior to Encounter   Medication Sig    predniSONE (DELTASONE) 5 mg tablet Take 1 Tablet by mouth Once Daily.    midodrine (PROAMATINE) 5 mg tablet Take 1 Tablet by mouth Three times a day for 30 days.    pantoprazole (PROTONIX) 40 mg DR tablet Take 1 Tablet by mouth Every 12 hours for 14 days.    gabapentin (NEURONTIN) 100 mg capsule Take 100 mg by mouth Three times a day.    oxyCODONE (ROXICODONE) 10 mg immediate release tablet Take 10 mg by mouth Once daily as  needed for Pain.    traMADoL (ULTRAM) 50 mg tablet Take 50 mg by mouth Three times a day as needed for Pain.    sucroferric oxyhydroxide (VELPHORO) 500 mg Chew Take 500 mg by mouth Three times a day. With meals    cholecalciferol, vitamin D3, (VITAMIN D3) Tab tablet Take 2,000 Units by mouth Every morning.       Review of Systems     Review of Systems   Constitutional:  Positive for fatigue. Negative for fever.   HENT:  Negative for facial swelling.    Eyes:  Negative for visual disturbance.   Respiratory:  Negative for cough and shortness of breath.    Cardiovascular:  Negative for chest pain.        Hypotension    Gastrointestinal:  Negative for abdominal pain.   Endocrine: Negative for cold intolerance and heat intolerance.   Genitourinary:  Positive for dysuria.   Musculoskeletal:  Negative for arthralgias.   Skin:  Negative for rash.   Allergic/Immunologic: Negative for immunocompromised state.   Neurological:  Positive for weakness. Negative for dizziness and headaches.   Hematological:  Does not bruise/bleed easily.   Psychiatric/Behavioral:  Negative for suicidal ideas.      Physical Exam     ED Triage Vitals   BP BP Manual or Automatic? Patient Position BP Location Heart Rate (Monitor)   04/06/23 0026 04/06/23 0026 04/06/23 0026 -- 04/06/23 0042   100/51 Automatic Semi Fowlers  92     Pulse Pulse Source Respirations Temp Temp Source   04/06/23 0026 -- 04/06/23 0026 04/06/23 0026 04/06/23 0026   96  16 99.3 F (37.4 C) Oral     SpO2 SPO2 Location O2 Delivery O2 Device O2 Flow Rate (l/min)   04/06/23 0026 -- 04/06/23 0026 -- --   99 %  Room air       FIO2 (%) Pain Intensity 1 Exacerbated By Relieved By Quality   -- -- -- -- --       Duration       --           Physical Exam  Vitals and nursing note reviewed.  Constitutional:       General: He is not in acute distress.     Appearance: He is well-developed. He is not diaphoretic.   HENT:      Head: Normocephalic and atraumatic.   Eyes:      General:          Right eye: No discharge.         Left eye: No discharge.      Pupils: Pupils are equal, round, and reactive to light.   Cardiovascular:      Rate and Rhythm: Normal rate and regular rhythm.      Heart sounds: Normal heart sounds. No murmur heard.     No friction rub. No gallop.   Pulmonary:      Effort: Pulmonary effort is normal. No respiratory distress.      Breath sounds: Normal breath sounds.   Chest:      Comments: Dialysis catheter in place L chest wall    Abdominal:      General: There is no distension.      Palpations: Abdomen is soft.      Tenderness: There is no abdominal tenderness.   Musculoskeletal:         General: No deformity.      Cervical back: Normal range of motion and neck supple.      Comments: Dialysis fistula present in L AC   Skin:     General: Skin is warm and dry.      Findings: No erythema or rash.   Neurological:      Mental Status: He is alert and oriented to person, place, and time.      Cranial Nerves: No cranial nerve deficit.      Sensory: No sensory deficit.      Motor: No weakness.     MDM         Treatment:  Procedures    Medications   -PHARMACY TO DOSE CEFEPIME (MAXIPIME)- 1 Each (has no administration in time range)   -PHARMACY TO DOSE VANCOMYCIN (VANCOCIN)- 1 Each (has no administration in time range)   midodrine (PROAMATINE) tab 10 mg (has no administration in time range)   gabapentin (NEURONTIN) cap 100 mg (has no administration in time range)   oxyCODONE (ROXICODONE) immediate release tab 10 mg (has no administration in time range)   traMADoL (ULTRAM) tab 50 mg (has no administration in time range)   NS (sodium chloride 0.9%) IV bolus (0 mL Intravenous Stopped 04/06/23 0242)   cefepime (MAXIPIME) injection 2 g (2 g Intravenous Given 04/06/23 0123)   vancomycin (VANCOCIN) 2g in NS (sodium chloride 0.9%) (REMOTE STOCK) 2 g (0 g Intravenous Stopped 04/06/23 0345)   hePARin (porcine) 5,000 unit/mL injection for catheter 2 mL (2 mL Catheter Given 04/06/23 0102)     Results for  orders placed or performed during the hospital encounter of 04/06/23   SARS Cov-2/Influ A+B/RSV RNA    Specimen: Nasopharyngeal Swab   Result Value Ref Range    RSV RNA NEGATIVE Negative    Influenza A NEGATIVE Negative    Influenza B NEGATIVE Negative    SARS-CoV-2 RNA Undetected    CBC w/Differential   Result Value Ref Range    WBC 9.8 4.5 - 11.0 10*3/uL    RBC 3.34 (L) 4.50 - 5.90 10*6/uL    HGB 10.1 (L) 13.5 - 17.5 g/dL    HCT 16.1 (L) 09.6 - 53.0 %    MCV 92.9 80.0 - 100.0  fL    MCHC 32.5 32.0 - 36.0 g/dL    MCH 25.3 66.4 - 40.3 pg    RDW 17.2 10.7 - 18.7 %    MPV 7.6 6.5 - 10.0 fL    Platelet Cnt 271 150 - 450 10*3/uL    Differential Type Auto     Neutrophils 69.7 (H) 35.0 - 66.0 %    Lymphocytes 13.4 (L) 24.0 - 44.0 %    Monocytes 9.4 2.1 - 13.3 %    Eosinophils 6.7 (H) 0.3 - 5.0 %    Basophils 0.8 0.0 - 1.0 %    Neutrophils Abs 6.8 1.5 - 8.5 10*3/uL    Lymphocytes Abs 1.3 1.1 - 5.0 10*3/uL    Monocytes Abs 0.9 0.0 - 1.4 10*3/uL    Eosinophils Abs 0.7 (H) 0.0 - 0.5 10*3/uL    Basophils Abs 0.1 0.0 - 0.1 10*3/uL    MDW 19.6 0.0 - 20.0   Comprehensive Metabolic Panel   Result Value Ref Range    SODIUM 134 (L) 135 - 145 mmol/L    POTASSIUM 3.5 (L) 3.6 - 5.0 mmol/L    CHLORIDE 95 (L) 101 - 111 mmol/L    CO2 27 21 - 31 mmol/L    ANION GAP 12     GLUCOSE 93 70 - 110 mg/dL    CREATININE 8.0 (HH) 0.6 - 1.2 mg/dL    BUN 23 2 - 32 mg/dL    CALCIUM 8.9 8.5 - 47.4 mg/dL    PROTEIN TOTAL 7.8 6.1 - 7.8 g/dL    Albumin 3.7 3.2 - 5.0 g/dL    T BILIRUBIN 0.5 0.2 - 1.0 mg/dL    ALP 68 42 - 259 [iU]/L    AST 13 10 - 42 [iU]/L    ALT (SGPT) <3 (L) 10 - 60 [iU]/L    OSMOLALITY 272 266 - 309    A/G Ratio 0.9     B/C 3 (L) 10 - 20    ESTIMATED GFR 8 mL/min   Procalcitonin, QN, S   Result Value Ref Range    PROCALCITONIN, QN, S 0.97 ng/mL   Lactic Acid, Venous   Result Value Ref Range    LACTIC ACID 1.1 0.5 - 1.9 mmol/L   UA for Infection (Reflex Culture)    Specimen: Clean Catch Urine   Result Value Ref Range    UR GLUCOSE 70 (A)  NEGATIVE mg/dL    UR BILIRUBIN Negative NEGATIVE mg/dL    UR KETONE Negative NEGATIVE mg/dL    UR SP GRAVITY 5.638 1.005 - 1.030    UR BLOOD 1+ (A) NEGATIVE mg/dL    UR PH 8.5 5.0 - 9.0    UR PROTEIN 300 (A) NEGATIVE mg/dL    UR UROBILINOGEN <7.5 <2.0    UR NITRITE Negative NEGATIVE mg/dL    UR LEUKOCYTE 643 (A) NEGATIVE    UR COLOR Yellow YELLOW    UR CLARITY Hazy CLEAR    UR WBC 21-50 (A) 1 - 3 [HPF]    UR RBC 11-20 (A) 1 - 3 [HPF]    UR SQUAMOUS EPI 4-5 3 - 5 [HPF]    UR MUCOUS Rare NONE SEEN [HPF]    UR BACTERIA None seen NONE SEEN [HPF]   Lipase   Result Value Ref Range    LIPASE 61 11 - 82 U/L   Magnesium   Result Value Ref Range    MAGNESIUM 1.9 1.7 - 2.8 mg/dL   PT/APTT/INR   Result Value Ref  Range    PROTIME 13.0 10.1 - 13.7 s    INR 1.1 0.9 - 1.1    APTT 31.5 24.2 - 34.2 s   RT Blood Gases   Result Value Ref Range    PH BLOOD 7.53 (H) 7.35 - 7.45    PCO2 ARTERIAL 36 35 - 45 mm[Hg]    PO2 99 80 - 100 mm[Hg]    HCO3 30.5 (H) 22.0 - 28.0 mmol/L    BASE EXCESS 7.1 mmol/L    %O2 SATURATION 99.2 95.0 - 100.0 %    DRAW SITE RT Radial     MODE RA        Results           XR Portable Chest (Final result)  Result time 04/06/23 01:19:06      Final result              Impression:    IMPRESSION:   No acute findings.  THIS DOCUMENT HAS BEEN ELECTRONICALLY SIGNED BY PEYTON ROTEFF, MD ON 04/06/2023 01:18   AM            Narrative:    PROCEDURE INFORMATION:   Exam: XR Chest   Exam date and time: 04/06/2023 1:01 AM   Age: 38 years old   Clinical indication: Shortness of breath; Additional info: Other   TECHNIQUE:   Imaging protocol: Radiologic exam of the chest.   Views: 1 view.   COMPARISON:   CR XR PORTABLE CHEST 01/10/2023 3:53 PM   FINDINGS:   Tubes, catheters and devices: Left internal jugular catheter with the tip   projecting over the right atrium.   Lungs: Unremarkable. No consolidation.   Pleural spaces: Unremarkable. No pleural effusion. No pneumothorax.   Heart/Mediastinum: Unremarkable. No cardiomegaly.    Bones/joints: Unremarkable.                 Preliminary result              Impression:    This exam has been sent to [facility] for reading.  The final report is not yet available.                         EKG Seen by me:  Time & Date: 0052 and 04/06/2023  Interpretation: sinus rhythm  HR: 91 beats per minute  No STEMI    Consult:  0324: I spoke with Dr. Sullivan Lone, Hospitalist, about the pt's history of present illness, physical examination and course in the ED. Dr. Sullivan Lone accepts pt for admission.    Plan:    [facility] ED RECHECK: Admit: The pt is awake and alert at time of reevaluation. I spoke with the patient about his ED work up and diagnosis. I informed the patient that he will be admitted for further evaluation/treatment. All questions answered. The pt is agreeable.     Medical Decision Making  Concern for sepsis given dysuria and hypotension. Improved with fluids. Admitted for obs.     Amount and/or Complexity of Data Reviewed  Labs: ordered.  Radiology: ordered.    Risk  Prescription drug management.  Decision regarding hospitalization.      Progress Note:        ED Prescriptions    None      Final diagnoses:   Hypotension   UTI (urinary tract infection)       ED Disposition       ED Disposition   Admitted    Condition   --  Comment   Bed Reason: Medical Necessity [2]            Scribe Attestation:  Lysle Dingwall acting as a Neurosurgeon for and in the presence of Mayo, John, DO  Electronically signed by Lysle Dingwall 04/06/23 12:38 AM     Provider Attestation:  I personally performed the services described in the documentation, reviewed the documentation recorded by the scribe in my presence and it accurately and completely records my words and actions.   Electronically Signed By Warren Danes, DO 04/06/2023 6:17 AM    Electronically signed by Warren Danes, DO at 04/06/2023  6:17 AM EST

## 2023-04-06 NOTE — Unmapped External Note (Signed)
 Formatting of this note might be different from the original.  Blood cultures obtained from vascath using aseptic technique. Line was packed with 2cc heparin per MD order. Cultures labeled and sent to lab. Primary RN updated.     Electronically signed by Lorelle Gibbs, RN at 04/06/2023  1:21 AM EST

## 2023-04-06 NOTE — ED Notes (Signed)
 Formatting of this note might be different from the original.  Pt received of NS  Electronically signed by Maxine Glenn, RN at 04/06/2023  2:43 AM EST

## 2023-04-06 NOTE — Unmapped (Signed)
Formatting of this note might be different from the original.  Blood cultures obtained from vascath using aseptic technique. Line was packed with 2cc heparin per MD order. Cultures labeled and sent to lab. Primary RN updated.     Electronically signed by Lorelle Gibbs, RN at 04/06/2023  1:21 AM EST

## 2023-04-06 NOTE — ED Notes (Signed)
 Formatting of this note might be different from the original.  Received report from off going nurse pt medicated per floor orders pt resting in bed no distress noted awaiting admission bed will continue to monitor   Electronically signed by Moses Manners, RN at 04/06/2023  8:16 AM EST

## 2023-04-06 NOTE — Progress Notes (Signed)
Formatting of this note might be different from the original.  Went in patient room to administer medications.  Patient said that he wanted to talk to me.  Said he went to the restroom and left it in the toilet so I could see it.  There was a lot of blood.  Patient stated that this has been ongoing since October, even being admitted for it.  But when colonoscopy was done, a bleed could not be found.  Charge nurse was notified.  Electronically signed by Burnetta Sabin, RN at 04/06/2023  4:22 PM EST

## 2023-04-06 NOTE — ED Notes (Signed)
Formatting of this note might be different from the original.  Pt given toiletry and dental care assisted to restroom no distress noted awaiting admission bed will continue to monitor   Electronically signed by Moses Manners, RN at 04/06/2023 11:05 AM EST

## 2023-04-06 NOTE — Progress Notes (Signed)
Formatting of this note might be different from the original.  Notified by primary nurse that patient is having BRBPR. Dr. Fredric Mare notified with no new orders received.   Electronically signed by Payton Doughty, RN at 04/06/2023  4:13 PM EST

## 2023-04-06 NOTE — Unmapped External Note (Signed)
 Formatting of this note might be different from the original.    Problem: Discharge Planning  Goal: Knowledge of discharge instructions  Description: Interventions:  - Education, renal diet  - Medication reconciliation  - Education, post discharge follow up  - Education, when to call provider  Outcome: Ongoing    Problem: Fluid Volume, Excess  Goal: Maintain appropriate body weight  Description: Instructions:  - Fluid restriction  - Education, fluid restriction  - Intake and output measurement  - Daily weight  Outcome: Ongoing    Problem: Electrolyte Imbalance, Risk for (Renal Faliure)  Goal: Maintain a normal sinus heart rhythm with regular rate  Description: Interventions:  - Vitals/O2 sat/telemetry monitoring  Outcome: Ongoing  Goal: Maintain normal serum electrolyte levels  Description: Interventions:  - Monitor laboratory data as ordered and report deviations to provider  Outcome: Ongoing    Problem: Infection, Risk for Central Venous Catheter Associated Bloodstream Infection  Goal: Absence of central venous catheter-associated bloodstream infection  Description: Interventions:  - Optimal catheter site selection, with avoidance of the femoral vein for central venous access in adult patients  - Maximal barrier precautions upon insertion  - Hand hygiene  - Chlorhexidine skin antisepsis  - Central line needs assessment  Outcome: Ongoing    Problem: Coping, Ineffective  Goal: Effective coping  Description: Interventions:  - Ineffective coping signs and symptoms assessment  - Consult to social services  - Cognitive emotional support  - Provide continuum of care to patient and family  - Education, healthy daily routines  - Education, disease process  Outcome: Ongoing    Problem: Skin Integrity, Impaired, Risk for  Goal: Absence of pressure injury  Description: Interventions:  - Skin assessment  - Patient repositioning every 2 hours if decreased mobility  Outcome: Ongoing    Problem: Infection, Risk for  Goal: Patient  will be free from infection  Description: Interventions:  1. Monitor for signs and symptoms of infection: fever, increased WBC, burning with urination or chills  2. Monitor insertion site for redness, tenderness, or drainage  3. Hand hygiene per CDC guidelines  4. Discontinuation of invasive devices when not needed  5. Place patient in appropriate isolation  Outcome: Ongoing    Problem: Pain, Acute  Goal: Communication of presence of pain  Description: Interventions:  - Nonpharmacologic pain management  - Medication administration  - Education, pain scale  Outcome: Ongoing    Problem: Deep Venous Thrombosis, Risk of  Goal: Absence of deep venous thrombosis  Description: Interventions:  - Deep venous thrombosis risk assessment  - Ensure patient is receiving antithrombotic medication for VTE prophylaxis  - Activity promotion  - Graduated anti-embolic stocking management  - Intermittent pneumatic compression management.  Outcome: Ongoing    Electronically signed by Burnetta Sabin, RN at 04/06/2023  3:19 PM EST

## 2023-04-07 NOTE — Progress Notes (Signed)
Formatting of this note is different from the original.  Day 1 Vancomycin, Cefepime     Indication: sepsis    Current Weights for Eivan, Stepper       Recorded Adjusted Ideal    86.1 kg (189 lb 13.1 oz) 79.6 kg (175 lb 8.5 oz) 75.3 kg (166 lb 0.1 oz)    Yesterday           BP (!) 96/68 (BP Location: Right Upper Extremity, Patient Position: Lying)   Pulse 87   Temp 98.3 F (36.8 C) (Oral)   Resp 18   Ht 5' 11 (180.3 cm)   Wt 86.1 kg (189 lb 13.1 oz)   SpO2 97%   BMI 26.47 kg/m     Estimated Creatinine Clearance: 13.3 mL/min (A) (by C-G formula based on SCr of 8 mg/dL [facility])).    CREATININE   Date/Time Value Ref Range Status   04/06/2023 12:53 AM 8.0 (HH) 0.6 - 1.2 mg/dL Final   69/62/9528 41:32 AM 10.4 (HH) 0.6 - 1.2 mg/dL Final   44/03/270 53:66 AM 7.4 (HH) 0.6 - 1.2 mg/dL Final     Plan: Cefepime 2g IV x1 then 1g every 24 hours  Vancomycin 2g IV x1 then pulse dose     Thank you,  Wallie Char, PHARMD  Electronically signed by Wallie Char, PHARMD at 04/07/2023 12:31 AM EST

## 2023-04-07 NOTE — Unmapped External Note (Signed)
 Formatting of this note might be different from the original.  Handoff report received from Roxie, Charity fundraiser. Bedside walking rounds completed. Environment scanned for safety. All questions answered.    Electronically signed by Kathaleen Maser, LPN at 47/42/5956  7:49 AM EST

## 2023-04-07 NOTE — Consults (Signed)
 Associated Order(s): IP CONSULT TO GASTROENTEROLOGY  Formatting of this note is different from the original.  GASTROENTEROLOGY CONSULT NOTE    [name]    04/07/2023    Patient Name:  Roberto Rice      DOB:  08-24-1985      MEDICAL RECORD NUMBER [id number]    REQUESTING PHYSICIAN  Nicholaus Credit, MD    PRIMARY CARE PHYSICIAN  Rufino Lye, APRN    REASON FOR CONSULTATION  Roberto Rice is a 38 y.o. male who was referred for consultation for Rectal bleeding.    Pt being seen while on call in coverage for Dr Forest.     HPI:   This is a 38 y.o. male with a history as below to include recurrent rectal bleeding. He presented to ER for hypotension and increased fatigue after dialysis. Started on Vancomycin  for concerns of UTI. BRBPR noted after admission.  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   HGB 9.4  Cr 11.2 BUN 36  Liver function stable. Patient reports rectal bleeding has been intermittent with recent endoscopic evaluation. EGD 12/03/22, Colonoscopy 07/01/22 for the same concern. No N/V, no chest pain or shortness of breath. Appetite fluctuates with weight stable.     EGD 12/03/22  Impression:            - Normal esophagus.                          - Z-line regular, 45 cm from the incisors.                          - Gastritis. Biopsied.                          - Erythematous duodenopathy.   Colonoscopy 07/01/22  Impression:            - Preparation of the colon was fair.                          - A few non-bleeding colonic angiodysplastic lesions.                          Treated with a monopolar probe.                          - Non-bleeding internal hemorrhoids.                          - No specimens collected.     MEDICAL HISTORY  Past Medical History:   Diagnosis Date    Blood transfusion, without reported diagnosis     Clostridium difficile colitis 10/12/2022    Epigastric pain     Fistula, arteriovenous, acquired (CMS/HCC)     has old fistula right arm, may be used for  secondary fistula creation. is to see vascular    Hemodialysis     m-w-f  in [place]    Hepatitis C antibody positive in blood 11/08/2022    Hyperlipidemia 01/08/2023    Hypertension 01/08/2023    Kidney disease approx 4 weeks    esrd dialysis m- w- f [place]    Kidney transplant rejection     on hemodialysis    Limb alert care status  Right arm (has old fistula not completely thrombosed. May be used for secondary fistula creation)    Nausea and vomiting, unspecified vomiting type     S/P dialysis catheter insertion (CMS/HCC)     chest     SURGICAL HISTORY  Past Surgical History:   Procedure Laterality Date    AV FISTULOGRAM N/A 10/04/2011    AV FISTULOGRAM performed by Robbert Blunt, MD at [facility] VASCULAR LABS    EMBOLIZATION Right 10/04/2011    EMBOLIZATION performed by Robbert Blunt, MD at [facility] VASCULAR LABS    HX ARTERIO-VENOUS FISTULA Right 08/16/2011    ARTERIO-VENOUS FISTULA performed by Robbert Blunt, MD at [facility] CVOR    HX BROKEN/FX BONES      lt ankle ten pins and a plate    HX COLONOSCOPY N/A 07/01/2022    COLONOSCOPY /C ARGON PLASMA COAGULATION performed by Marko Honer, MD at [facility] MAIN OR    HX EGJ N/A 12/03/2022    EGD /C BIOPSY performed by Forest Fess, MD at [facility] ENDO    HX OTHER SURGICAL HISTORY      tunnel cath 06/2011 [facility]    HX ULTRASOUND, INTRAOPERATIVE Right 08/16/2011    ULTRASOUND, INTRAOPERATIVE performed by Robbert Blunt, MD at [facility] CVOR    TUNNEL CATH PLACEMENT N/A 08/02/2011    TUNNEL CATH PLACEMENT performed by Robbert Blunt, MD at [facility] VASCULAR LABS    VENOUS PTA Right 10/04/2011    VENOUS PTA performed by Robbert Blunt, MD at [facility] VASCULAR LABS     MEDICATIONS  Medications Prior to Admission   Medication Sig Dispense Refill Last Dose    predniSONE  (DELTASONE ) 5 mg tablet Take 1 Tablet by mouth Once Daily.       midodrine (PROAMATINE) 5 mg tablet Take 1 Tablet by mouth Three times a day for 30 days. 90 Tablet 0     [EXPIRED] pantoprazole (PROTONIX) 40 mg DR tablet Take 1 Tablet by  mouth Every 12 hours for 14 days. 28 Tablet 0     gabapentin (NEURONTIN) 100 mg capsule Take 100 mg by mouth Three times a day.       oxyCODONE  (ROXICODONE ) 10 mg immediate release tablet Take 10 mg by mouth Once daily as needed for Pain.       traMADoL (ULTRAM) 50 mg tablet Take 50 mg by mouth Three times a day as needed for Pain.       sucroferric oxyhydroxide (VELPHORO) 500 mg Chew Take 500 mg by mouth Three times a day. With meals       cholecalciferol, vitamin D3, (VITAMIN D3) Tab tablet Take 2,000 Units by mouth Every morning.        Current Facility-Administered Medications   Medication Dose Route Frequency Provider Last Rate Last Admin    VANCOMYCIN  IV PULSE DOSE 1 Each  1 Each Intravenous CONSULT Mayo, John, DO        cefepime (MAXIPIME) 1 g in NS (sodium chloride  0.9%) 100 mL MINI-BAG PLUS  1 g Intravenous Q24H Mayo, John, DO 200 mL/hr at 04/07/23 0130 1 g at 04/07/23 0130    VANCOMYCIN  IV PULSE DOSE 1 Each  1 Each Intravenous CONSULT Mayo, John, DO        -PHARMACY TO DOSE CEFEPIME (MAXIPIME)- 1 Each  1 Each Does not apply CONSULT Gilbert, William, DO        -PHARMACY TO DOSE VANCOMYCIN  (VANCOCIN )- 1 Each  1 Each Intravenous CONSULT Gilbert, William, DO        midodrine (  PROAMATINE) tab 10 mg  10 mg Oral TID (NST) Bertrum Fallow, DO   10 mg at 04/06/23 1804    gabapentin (NEURONTIN) cap 100 mg  100 mg Oral TID Bertrum Fallow, DO   100 mg at 04/06/23 1607    oxyCODONE  (ROXICODONE ) immediate release tab 10 mg  10 mg Oral DAILY PRN Bertrum Fallow, DO        traMADoL (ULTRAM) tab 50 mg  50 mg Oral TID PRN Bertrum Fallow, DO        heparin  (porcine) injection 5,000 Units  5,000 Units Subcutaneous Q8H Bertrum Fallow, DO        acetaminophen  (TYLENOL ) tab 650 mg  650 mg Oral Q4H PRN Bertrum Fallow, DO        ondansetron  hcl (PF) (ZOFRAN ) injection 4 mg  4 mg Intravenous Q6H PRN Bertrum Fallow, DO        B complex-vitamin C-folic acid (NEPHROVITE RX) tab 1 Tablet  1 Tablet Oral DAILY Raenelle Lynwood ORN.,  MD   1 Tablet at 04/06/23 1319    pantoprazole (PROTONIX) injection 40 mg  40 mg Intravenous Q12H Bailey, Kayla, MD   40 mg at 04/06/23 2059     ALLERGIES  Allergies   Allergen Reactions    Nsaids (Non-Steroidal Anti-Inflammatory Drug) Other (See Comments)     CANNOT TAKE DUE TO KIDNEY FAILURE   CANNOT TAKE DUE TO KIDNEY FAILURE       SOCIAL HISTORY  Social History     Socioeconomic History    Marital status: Married   Tobacco Use    Smoking status: Never     Passive exposure: Never    Smokeless tobacco: Never   Vaping Use    Vaping status: Never Used   Substance and Sexual Activity    Alcohol use: No    Drug use: Yes     Frequency: 2.0 times per week     Types: Marijuana     Social Determinants of Health     Food Insecurity: No Food Insecurity (04/06/2023)    Hunger Vital Sign     Worried About Running Out of Food in the Last Year: Never true     Ran Out of Food in the Last Year: Never true   Transportation Needs: No Transportation Needs (04/06/2023)    PRAPARE - Transportation     Lack of Transportation (Medical): No     Lack of Transportation (Non-Medical): No   Intimate Partner Violence: Not At Risk (04/06/2023)    Humiliation, Afraid, Rape, and Kick questionnaire     Fear of Current or Ex-Partner: No     Emotionally Abused: No     Physically Abused: No     Sexually Abused: No   Housing Stability: Low Risk  (04/06/2023)    Housing Stability Vital Sign     Unable to Pay for Housing in the Last Year: No     Number of Times Moved in the Last Year: 0     Homeless in the Last Year: No     FAMILY HISTORY  Family History   Problem Relation Name Age of Onset    Hypertension Mother      Hypertension Father       REVIEW OF SYSTEMS    Review of Systems   Constitutional: Negative.    HENT: Negative.     Eyes: Negative.    Respiratory: Negative.  Negative for shortness of breath.    Cardiovascular:  Positive for chest pain.  Gastrointestinal:  Positive for abdominal pain and blood in stool.   Genitourinary: Negative.         On  dialysis since July.    Musculoskeletal: Negative.    Neurological: Negative.    Psychiatric/Behavioral:  Positive for depression.      PHYSICAL EXAM  Vital Signs:   Recorded Vitals    04/06/23 1700 04/06/23 1948 04/07/23 0024 04/07/23 0453   BP: (!) 84/42 102/57 (!) 96/68 118/62   Pulse:  76 87 81   Resp:  17 18 17    Temp:  98.3 F (36.8 C) 98.3 F (36.8 C) 98.4 F (36.9 C)   TempSrc:  Oral Oral Axillary   SpO2:  99% 97% 97%      Body mass index is 25.74 kg/m.    Physical Exam  Vitals and nursing note reviewed.   Constitutional:       Appearance: Normal appearance. He is normal weight.   HENT:      Head: Normocephalic.      Nose: Nose normal.      Mouth/Throat:      Mouth: Mucous membranes are moist.   Cardiovascular:      Rate and Rhythm: Normal rate and regular rhythm.   Pulmonary:      Effort: Pulmonary effort is normal.      Breath sounds: Normal breath sounds.   Abdominal:      General: Abdomen is flat. Bowel sounds are normal.      Palpations: Abdomen is soft.   Musculoskeletal:         General: Normal range of motion.      Cervical back: Normal range of motion.   Skin:     General: Skin is warm and dry.   Neurological:      General: No focal deficit present.      Mental Status: He is alert and oriented to person, place, and time. Mental status is at baseline.   Psychiatric:         Mood and Affect: Mood normal.         Behavior: Behavior normal.         Thought Content: Thought content normal.         Judgment: Judgment normal.       INTAKE & OUTPUT    Intake/Output Summary (Last 24 hours) at 04/07/2023 0850  Last data filed at 04/07/2023 0453  Gross per 24 hour   Intake 200 ml   Output --   Net 200 ml     LABS    Lab Results   Component Value Date/Time    WBC 8.3 04/07/2023 0034    HGB 9.9 04/07/2023 0709    HCT 29.9 04/07/2023 0709    MCV 93.8 04/07/2023 0034    MCHC 33.4 04/07/2023 0034    PLATELETCNT 262 04/07/2023 0034     CMP:   Lab Results   Component Value Date/Time    SODIUM 137 04/07/2023 0034     POTASSIUM 4.3 04/07/2023 0034    CHLORIDE 100 04/07/2023 0034    CO2 26 04/07/2023 0034    GLUCOSE 91 04/07/2023 0034    BUN 36 04/07/2023 0034    CREATININE 11.2 04/07/2023 0034    CALCIUM 8.0 04/07/2023 0034    PROTEINTOTAL 7.0 04/07/2023 0034    TBILIRUBIN 0.4 04/07/2023 0034    ALP 66 04/07/2023 0034    AST 16 04/07/2023 0034    ALTSGPT 5 04/07/2023 0034    ALBUMIN 3.3 04/07/2023  0034    AGRATIO 0.9 04/07/2023 0034     Lab Results   Component Value Date    INR 1.1 04/06/2023        PROTIME 13.0 04/06/2023         Lab Results   Component Value Date    LIPASE 61 04/06/2023     REPORTS OF IMAGING & OTHER STUDIES  CT pending    CR XR PORTABLE CHEST   Tubes, catheters and devices: Left internal jugular catheter with the tip   projecting over the right atrium.   Lungs: Unremarkable. No consolidation.   Pleural spaces: Unremarkable. No pleural effusion. No pneumothorax.   Heart/Mediastinum: Unremarkable. No cardiomegaly.   Bones/joints: Unremarkable.   IMPRESSION  IMPRESSION:   No acute findings.  ASSESSMENT AND PLAN  Patient discussed with Dr. Belita. Per Dr. Belita orders as follows.    1. Rectal Bleeding, intermittent, per patient report.   -Monitor Hgb, currently 8.7.   -Transfuse as indicated to maintain Hgb >7, deferred to medicine.   -IV PPI  -Supportive care.   -Further treatment pending clinical course.   -Patient willing to consider colonoscopy after speaking to Dr Lesta regarding plan for ESRD. Currently reporting he was told he needed to be transferred to [facility], but does not want to be transferred. He does not want to address further issues until speaking to Dr. Lesta.     2. Hypotension  -per medicine    3. ESRD with chronic transplant rejection  -Managed per Nephrology    4. Sepsis secondary to UTI  -per medicine.     See orders.   Further management pending hospital course.   Patient and/or family was updated on diagnoses, procedures, tests, results, and plan of care.    Thank you for  consulting.    Electronically signed by:   [name], APRN  04/07/2023   8:50 AM     CONSULTANT NOTE    Patient seen on : 04-07-2023    HPI: as noted above    Physical Exam:  General: alert, no distress  Mental status:Alert, oriented, thought content appropriate  HEENT:negative.  CVS: normal S1 and S2   Respiratory: chest clear to auscultation bilaterally  Abdomen:not distended, mild tenderness in lower abdomen, BS normal  Extremities: extremities normal, atraumatic, no cyanosis or edema  Skin: multiple tattoos    Impression/Recommendations:  Please see above for my assessment and Plan.    I have personally performed a face to face diagnostic evaluation on this patient and discussed his/her management with the NPP. I reviewed the NPP note and agree with the documented findings and plan of care except for additional findings as listed above.    Signed:  Channing CROME. Belita, M.D., MD  04/07/2023  4:28 PM       Electronically signed by Belita Channing CROME., MD at 04/07/2023  7:45 PM EST

## 2023-04-07 NOTE — Unmapped (Signed)
Formatting of this note might be different from the original.    Problem: Discharge Planning  Goal: Knowledge of discharge instructions  Description: Interventions:  - Education, renal diet  - Medication reconciliation  - Education, post discharge follow up  - Education, when to call provider  Outcome: Met This Shift    Problem: Fluid Volume, Excess  Goal: Maintain appropriate body weight  Description: Instructions:  - Fluid restriction  - Education, fluid restriction  - Intake and output measurement  - Daily weight  Outcome: Met This Shift    Problem: Electrolyte Imbalance, Risk for (Renal Faliure)  Goal: Maintain a normal sinus heart rhythm with regular rate  Description: Interventions:  - Vitals/O2 sat/telemetry monitoring  Outcome: Met This Shift  Goal: Maintain normal serum electrolyte levels  Description: Interventions:  - Monitor laboratory data as ordered and report deviations to provider  Outcome: Met This Shift    Problem: Infection, Risk for Central Venous Catheter Associated Bloodstream Infection  Goal: Absence of central venous catheter-associated bloodstream infection  Description: Interventions:  - Optimal catheter site selection, with avoidance of the femoral vein for central venous access in adult patients  - Maximal barrier precautions upon insertion  - Hand hygiene  - Chlorhexidine skin antisepsis  - Central line needs assessment  Outcome: Met This Shift    Problem: Skin Integrity, Impaired, Risk for  Goal: Absence of pressure injury  Description: Interventions:  - Skin assessment  - Patient repositioning every 2 hours if decreased mobility  Outcome: Met This Shift    Problem: Infection, Risk for  Goal: Patient will be free from infection  Description: Interventions:  1. Monitor for signs and symptoms of infection: fever, increased WBC, burning with urination or chills  2. Monitor insertion site for redness, tenderness, or drainage  3. Hand hygiene per CDC guidelines  4. Discontinuation of  invasive devices when not needed  5. Place patient in appropriate isolation  Outcome: Met This Shift    Problem: Coping, Ineffective  Goal: Effective coping  Description: Interventions:  - Ineffective coping signs and symptoms assessment  - Consult to social services  - Cognitive emotional support  - Provide continuum of care to patient and family  - Education, healthy daily routines  - Education, disease process  Outcome: Met This Shift    Electronically signed by Fabio Asa, RN at 04/07/2023  6:29 AM EST

## 2023-04-07 NOTE — Progress Notes (Signed)
Formatting of this note might be different from the original.  Per ultrasound tech, you must be NPO x8 hours for US Renal Artery Duplex. They state patient can be scanned at 2130 if made NPO now. Patient notified and agreeable.   Electronically signed by Payton Doughty, RN at 04/07/2023  1:40 PM EST

## 2023-04-07 NOTE — Progress Notes (Signed)
 Formatting of this note might be different from the original.  Pharmacy note: Vancomycin dosing  Goal level = </= 20 mcg/ml  Random level = 29.9 mcg/ml        A/P:  Level drawn appropriately.  Will plan to not order a dose of Vancomycin at this time.  Will continue to monitor and make dosing adjustments as needed.   Thank you,  Lorra Hals, [facility]  Electronically signed by Lorra Hals, RPH at 04/07/2023  1:58 AM EST

## 2023-04-07 NOTE — Progress Notes (Signed)
Formatting of this note is different from the original.  HOSPITAL MEDICINE PROGRESS NOTE    Patient:  Roberto Rice  MRN:  [id number]  Room: 0A540/9W119J  Admit Date: 04/06/2023  Hospital Day:   LOS: 0 days   Current Date: 04/07/2023    Chief Complaint - follow-up for Sepsis secondary to UTI (CMS/HCC)     Hospital Course to Date: No notes on file    Subjective/Interval History:   No acute events overnight.  Af, VSS.    Seen and examined between 9 AM and noon.    Relevant ROS: Review of Systems   Respiratory:  Negative for shortness of breath.    Cardiovascular:  Negative for chest pain.   Gastrointestinal:         BRBPR       Physical Exam for 04/07/2023  Blood pressure 113/61, pulse 93, temperature 98.5 F (36.9 C), temperature source Oral, resp. rate 18, height 5' 11 (180.3 cm), weight 83.7 kg (184 lb 8.4 oz), SpO2 100%.  Physical Exam  Vitals reviewed.   Constitutional:       General: He is not in acute distress.  HENT:      Head: Normocephalic and atraumatic.   Cardiovascular:      Rate and Rhythm: Normal rate.   Pulmonary:      Effort: Pulmonary effort is normal. No respiratory distress.      Breath sounds: No stridor.   Neurological:      Mental Status: He is alert.     Labs and Imaging:  Recent Labs     04/07/23  1152 04/07/23  0709 04/07/23  0034 04/06/23  0053   WBC  --   --  8.3 9.8   RBC  --   --  3.02* 3.34*   HGB 8.7* 9.9* 9.4* 10.1*   HCT 26.5* 29.9* 28.3* 31.0*   MCV  --   --  93.8 92.9   PLATELETCNT  --   --  262 271   SODIUM  --   --  137 134*   POTASSIUM  --   --  4.3 3.5*   MAGNESIUM  --   --  2.0 1.9   PHOSPHORUS  --   --  5.7*  --    CHLORIDE  --   --  100* 95*   CO2  --   --  26 27   GLUCOSE  --   --  91 93   BUN  --   --  36* 23   CREATININE  --   --  11.2* 8.0*   CALCIUM  --   --  8.0* 8.9   TBILIRUBIN  --   --  0.4 0.5   ALP  --   --  66 68   AST  --   --  16 13   ALBUMIN  --   --  3.3 3.7   INR  --   --   --  1.1     Assessment and Plan     Hospital Problems and Relevant comorbid  conditions:  Principal Problem:    Sepsis secondary to UTI (CMS/HCC)  Active Problems:    Renal transplant, status post    Hypertension    ESRD (end stage renal disease) on dialysis (CMS/HCC)    * Sepsis secondary to UTI (CMS/HCC)  C/w empiric abx.  Follow cultures.  Blood cx x 2 NG x 24 hours.  Urine cx negative.  ESRD - nephrology following for dialysis.    Renal transplant - renal allografts edematous, d/w Dr. Jenetta Downer.  Recommends transfer to [facility] for transplant urologic evaluation.  IV solumedrol per renal.    Intermittent tachycardia - tele.  Was potentially arguing with someone when this happened earlier today.    BRBPR - Hb down some this afternoon, follow serial H&H.  GI consult pending.    GI/DVT ppx - PPI.  Heparin subQ.    High risk with potential renal transplant issues.  Awaiting acceptance at [facility].     Signed,  Marianne Sofia, MD  04/07/2023      Electronically signed by Marianne Sofia, MD at 04/07/2023 12:41 PM EST

## 2023-04-07 NOTE — Unmapped External Note (Signed)
 Formatting of this note might be different from the original.  Handoff report given to Senzi, CHARITY FUNDRAISER. Bedside walking rounds completed. Environment scanned for safety. All questions answered.    Electronically signed by Jorden Hollering, LPN at 98/87/7974  7:30 PM EST

## 2023-04-07 NOTE — Unmapped (Signed)
Formatting of this note might be different from the original.  Flonnie Overman    Test Name: Cr  Results:  11.2  04/07/2023  Time: 0142  Received from: Lab  R/V by: Verdis Frederickson    (Required: Attempt notification within 30 minutes or document reason for no notification)    Physician: Deboraha Sprang  04/07/2023  Time: 0142  Notified: No - Due to Expected Result: Dialysis patient      Electronically signed by Florestine Avers, LPN at 54/11/8117  1:42 AM EST

## 2023-04-07 NOTE — Progress Notes (Signed)
 Formatting of this note might be different from the original.  D/w patient and wife.  He would be willing to go to [facility] but would prefer [facility] as he was already supposed to establish there for another transplant.  He would not be willing to go to [facility].  I d/w Dr. Raenelle and transfer line.  Transfer packet was reviewed and signed with patient.  Electronically signed by Nicholaus Credit, MD at 04/07/2023  2:48 PM EST

## 2023-04-07 NOTE — Progress Notes (Signed)
 Formatting of this note might be different from the original.  Call received from [facility] with [facility]. She states the radiologist (Dr. Chestine Spore) needs to speak with physician regarding findings. Communicated this to Dr. Jenetta Downer via [facility] and call back number provided.   Electronically signed by Payton Doughty, RN at 04/07/2023 11:01 AM EST

## 2023-04-07 NOTE — Progress Notes (Signed)
 Formatting of this note might be different from the original.  Per ultrasound tech, you must be NPO x8 hours for US Renal Artery Duplex. They state patient can be scanned at 2130 if made NPO now. Patient notified and agreeable.   Electronically signed by Payton Doughty, RN at 04/07/2023  1:40 PM EST

## 2023-04-07 NOTE — Progress Notes (Signed)
Formatting of this note might be different from the original.  Call received from [facility] with [facility]. She states the radiologist (Dr. Chestine Spore) needs to speak with physician regarding findings. Communicated this to Dr. Jenetta Downer via [employer] [facility] and call back number provided.   Electronically signed by Payton Doughty, RN at 04/07/2023 11:01 AM EST

## 2023-04-07 NOTE — Progress Notes (Signed)
 [place] [facility]  Department of Medicine  TRANSFER CALL NOTE    Location     [facility]     History of Present Illness     Roberto Rice is a 38 y.o. 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 No source of infection with blood cultures and urine cultures negative. Currently on Vanc, cefepime with improvement in hypotension after bolus. Outside of the hypotension the patient notes lower quadrant pain. Per report has had intermittent BRBPR and is awaiting GI assessment. No blood seen by staff.     Per chart review has been experiencing right lower quadrant pain. 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 On CT-Potential fluid collection and edematous appearing kidneys. Patient discussed with transplant surgery prior to acceptance who recommended ultrasound assessment of transplants.     Discussed with OSH follow up of pending GI assessment and to reconnect with capacity management if patients Hgb were to drop or have worsening clinical status.    Objective     Last Known Vitals:  BP 113/61  HR 93   RR 18  100% on RA     Pertinent Lab/Imaging Studies:  Labs Available in CareEverywhere     CT Abdomen & Pelvis-NO CONTRAST     IMPRESSION:   1.   Normal appendix series 201, image 51.   2.   Two kidneys are seen in the right abdomen. They are edematous.. This could   indicate infection in the appropriate clinical setting.   3.   There may be small fluid collection between the 2 transplant kidneys. The   fluid collection measures 21 x 10 mm (series 201, image 62- 65) and 41   Hounsfield units. Nonspecific fluid collection. Differential includes mild   ascites, infection, hematoma.    THIS DOCUMENT HAS BEEN ELECTRONICALLY SIGNED BY COLERIDGE KURK, MD ON 04/07/2023   10:52 AM     Assessment / Plan     Roberto Rice is a 38 y.o. male who presented to OSH with acute on chronic kidney disease and potential sepsis. Transfer accepted to Internal Medicine med/surg floor status.     Will likely need:   -Transplant renal consult   -Transplant surgery consult   -Follow up of infectious work up   -Continue IV abx if concern for abscess / active infection   -Management of chronic medical conditions       Outside Imaging Required: None    Level of Care: floor      Roddie Ard, MD  Chief Medical Resident  [facility]   04/07/2023 1:53 PM

## 2023-04-07 NOTE — Consults (Signed)
 Associated Order(s): IP CONSULT TO NEPHROLOGY  Formatting of this note might be different from the original.  Please see Consult note done by Dr. Jenetta Downer  Electronically signed by Brion Aliment, MD at 04/07/2023 12:28 AM EST

## 2023-04-07 NOTE — Unmapped External Note (Signed)
 Formatting of this note might be different from the original.    Problem: Discharge Planning  Goal: Knowledge of discharge instructions  Description: Interventions:  - Education, renal diet  - Medication reconciliation  - Education, post discharge follow up  - Education, when to call provider  Outcome: Ongoing    Problem: Fluid Volume, Excess  Goal: Maintain appropriate body weight  Description: Instructions:  - Fluid restriction  - Education, fluid restriction  - Intake and output measurement  - Daily weight  Outcome: Ongoing    Problem: Electrolyte Imbalance, Risk for (Renal Faliure)  Goal: Maintain a normal sinus heart rhythm with regular rate  Description: Interventions:  - Vitals/O2 sat/telemetry monitoring  Outcome: Ongoing  Goal: Maintain normal serum electrolyte levels  Description: Interventions:  - Monitor laboratory data as ordered and report deviations to provider  Outcome: Ongoing    Problem: Infection, Risk for Central Venous Catheter Associated Bloodstream Infection  Goal: Absence of central venous catheter-associated bloodstream infection  Description: Interventions:  - Optimal catheter site selection, with avoidance of the femoral vein for central venous access in adult patients  - Maximal barrier precautions upon insertion  - Hand hygiene  - Chlorhexidine skin antisepsis  - Central line needs assessment  Outcome: Ongoing    Problem: Coping, Ineffective  Goal: Effective coping  Description: Interventions:  - Ineffective coping signs and symptoms assessment  - Consult to social services  - Cognitive emotional support  - Provide continuum of care to patient and family  - Education, healthy daily routines  - Education, disease process  Outcome: Ongoing    Problem: Skin Integrity, Impaired, Risk for  Goal: Absence of pressure injury  Description: Interventions:  - Skin assessment  - Patient repositioning every 2 hours if decreased mobility  Outcome: Ongoing    Problem: Infection, Risk for  Goal: Patient  will be free from infection  Description: Interventions:  1. Monitor for signs and symptoms of infection: fever, increased WBC, burning with urination or chills  2. Monitor insertion site for redness, tenderness, or drainage  3. Hand hygiene per CDC guidelines  4. Discontinuation of invasive devices when not needed  5. Place patient in appropriate isolation  Outcome: Ongoing    Problem: Pain, Acute  Goal: Communication of presence of pain  Description: Interventions:  - Nonpharmacologic pain management  - Medication administration  - Education, pain scale  Outcome: Ongoing    Problem: Deep Venous Thrombosis, Risk of  Goal: Absence of deep venous thrombosis  Description: Interventions:  - Deep venous thrombosis risk assessment  - Ensure patient is receiving antithrombotic medication for VTE prophylaxis  - Activity promotion  - Graduated anti-embolic stocking management  - Intermittent pneumatic compression management.  Outcome: Ongoing    Electronically signed by Jorden Hollering, LPN at 98/87/7974  6:39 PM EST

## 2023-04-07 NOTE — Unmapped External Note (Signed)
 Formatting of this note might be different from the original.  Roberto Rice    Test Name: Cr  Results:  11.2  04/07/2023  Time: 0142  Received from: Lab  R/V by: Verdis Frederickson    (Required: Attempt notification within 30 minutes or document reason for no notification)    Physician: Deboraha Sprang  04/07/2023  Time: 0142  Notified: No - Due to Expected Result: Dialysis patient      Electronically signed by Florestine Avers, LPN at 54/11/8117  1:42 AM EST

## 2023-04-07 NOTE — Unmapped (Signed)
Formatting of this note might be different from the original.  Handoff report received from Roxie, Charity fundraiser. Bedside walking rounds completed. Environment scanned for safety. All questions answered.    Electronically signed by Kathaleen Maser, LPN at 47/42/5956  7:49 AM EST

## 2023-04-07 NOTE — Assessment & Plan Note (Signed)
 Associated Problem(s): Sepsis secondary to UTI (CMS/HCC)  Formatting of this note might be different from the original.  C/w empiric abx.  Follow cultures.  Blood cx x 2 NG x 24 hours.  Urine cx negative.      ESRD - nephrology following for dialysis.    Renal transplant - renal allografts edematous, d/w Dr. Jenetta Downer.  Recommends transfer to [facility] for transplant urologic evaluation.  IV solumedrol per renal.    GI/DVT ppx - PPI.  Heparin subQ.    High risk with potential renal transplant issues.  Awaiting acceptance at [facility].  Electronically signed by Marianne Sofia, MD at 04/07/2023 12:39 PM EST

## 2023-04-07 NOTE — Unmapped External Note (Signed)
 Formatting of this note is different from the original.  Nursing End of Shift Summary    Pertinent changes to patient conditions and/or care this shift:       Vital Signs Weight: 83.7 kg (184 lb 8.4 oz) (04/07/23 0500)    Temp: 99 F (37.2 C)  Temp Source: Oral    Heart Rate:78    BP: 113/68    Respirations: 18   Oxygen Saturation SpO2: 100 %  O2 Delivery: Room air  O2 Device: None (Room air)    Incentive Spirometry Incentive Spirometry: No      Diet Diet NPO;  Tolerated Diet: Yes   Intake/Output Totals  Intake/Output          04/06/23 0700 - 04/07/23 0659 04/07/23 0700 - 04/08/23 0659     9299-8140 1900-0659 Total 9299-8140 1900-0659 Total        Intake    P.O.  --  200 200  360  -- 360    Total Intake -- 200 200 360 -- 360      Output    Urine  --  -- --  --  -- --    Urine Occurrence -- -- -- 2 x -- 2 x    Stool  --  -- --  --  -- --    Stool Occurrence -- -- -- 1 x -- 1 x    Total Output -- -- -- -- -- --             Activity Activity: Bedrest  Level of Assistance: Assistance times one  Activity Tolerance: Tolerated Fair  Ambulation Attempts this Shift: Did not ambulate this shift    Telemetry Cardiac Rhythm: Normal Sinus Rhythm (04/07/23 0900)   Telemetry Abnormalities No     Labs No results found for: FUNCTIONAL, PLTAGGREG    Lab Results   Component Value Date    RBC 3.02 (L) 04/07/2023    WBC 8.3 04/07/2023    HCT 26.5 (L) 04/07/2023    HGB 8.7 (L) 04/07/2023    PLATELETCNT 262 04/07/2023    MCH 31.3 04/07/2023    MCHC 33.4 04/07/2023    MCV 93.8 04/07/2023    MPV 7.9 04/07/2023     Lab Results   Component Value Date    ALBUMIN 3.3 04/07/2023    SODIUM 137 04/07/2023    POTASSIUM 4.3 04/07/2023    CHLORIDE 100 (L) 04/07/2023    CO2 26 04/07/2023    ANIONGAP 11 04/07/2023    GLUCOSE 91 04/07/2023    CREATININE 11.2 (HH) 04/07/2023    BUN 36 (H) 04/07/2023    CALCIUM 8.0 (L) 04/07/2023    PROTEINTOTAL 7.0 04/07/2023    TBILIRUBIN 0.4 04/07/2023    ALP 66 04/07/2023    AST 16 04/07/2023    ALTSGPT 5 (L)  04/07/2023    OSMOLALITY 282 04/07/2023    AGRATIO 0.9 04/07/2023    BC 3 (L) 04/07/2023    ESTIMATEDGFR 5 04/07/2023         Taking all meds Yes   Any PRN meds given? No    Current IV Infusions     Diuretics      SDOH Screen Assessed? Not Assessed   Discharge Planning Home   Expected Discharge Date    Education Provided Other (Comment)             Electronically signed by Jorden Hollering, LPN at 98/87/7974  6:40 PM EST

## 2023-04-07 NOTE — Progress Notes (Signed)
 Formatting of this note is different from the original.  Day 1 Vancomycin, Cefepime     Indication: sepsis    Current Weights for Eivan, Stepper       Recorded Adjusted Ideal    86.1 kg (189 lb 13.1 oz) 79.6 kg (175 lb 8.5 oz) 75.3 kg (166 lb 0.1 oz)    Yesterday           BP (!) 96/68 (BP Location: Right Upper Extremity, Patient Position: Lying)   Pulse 87   Temp 98.3 F (36.8 C) (Oral)   Resp 18   Ht 5' 11 (180.3 cm)   Wt 86.1 kg (189 lb 13.1 oz)   SpO2 97%   BMI 26.47 kg/m     Estimated Creatinine Clearance: 13.3 mL/min (A) (by C-G formula based on SCr of 8 mg/dL [facility])).    CREATININE   Date/Time Value Ref Range Status   04/06/2023 12:53 AM 8.0 (HH) 0.6 - 1.2 mg/dL Final   69/62/9528 41:32 AM 10.4 (HH) 0.6 - 1.2 mg/dL Final   44/03/270 53:66 AM 7.4 (HH) 0.6 - 1.2 mg/dL Final     Plan: Cefepime 2g IV x1 then 1g every 24 hours  Vancomycin 2g IV x1 then pulse dose     Thank you,  Wallie Char, PHARMD  Electronically signed by Wallie Char, PHARMD at 04/07/2023 12:31 AM EST

## 2023-04-07 NOTE — Assessment & Plan Note (Signed)
Associated Problem(s): Sepsis secondary to UTI (CMS/HCC)  Formatting of this note might be different from the original.  C/w empiric abx.  Follow cultures.  Blood cx x 2 NG x 24 hours.  Urine cx negative.      ESRD - nephrology following for dialysis.    Renal transplant - renal allografts edematous, d/w Dr. Jenetta Downer.  Recommends transfer to [facility] for transplant urologic evaluation.  IV solumedrol per renal.    GI/DVT ppx - PPI.  Heparin subQ.    High risk with potential renal transplant issues.  Awaiting acceptance at [facility].  Electronically signed by Marianne Sofia, MD at 04/07/2023 12:39 PM EST

## 2023-04-07 NOTE — Progress Notes (Signed)
Formatting of this note is different from the original.      Nephrology Follow-up Note    CC: Follow-up on ESRD    Interval history: Over the last 24 hours, symptoms/labs progression:  stable. Associated symptoms: no edema. Blood pressure stable    Physical Exam  Constitutional: negative for distress  Cardiovascular:  negative for edema  Pulmonary: negative for audible wheezing    Recent Labs     04/07/23  0709 04/07/23  0034 04/06/23  0053   WBC  --  8.3 9.8   RBC  --  3.02* 3.34*   HGB 9.9* 9.4* 10.1*   HCT 29.9* 28.3* 31.0*   MCV  --  93.8 92.9   PLATELETCNT  --  262 271   SODIUM  --  137 134*   OSMOLALITY  --  282 272   POTASSIUM  --  4.3 3.5*   MAGNESIUM  --  2.0 1.9   PHOSPHORUS  --  5.7*  --    CHLORIDE  --  100* 95*   CO2  --  26 27   ANIONGAP  --  11 12   GLUCOSE  --  91 93   BUN  --  36* 23   CREATININE  --  11.2* 8.0*   CALCIUM  --  8.0* 8.9   TBILIRUBIN  --  0.4 0.5   ALP  --  66 68   AST  --  16 13   ALTSGPT  --  5* <3*   ALBUMIN  --  3.3 3.7   INR  --   --  1.1   LIPASE  --   --  61     Assessment/plan  ESRD with hemodialysis typically on a Monday Wednesday Friday schedule. We'll plan for dialysis tomorrow per usual schedule  Hypotension. Improved with IV fluid bolus. Continue ProAmatine  Anemia of CKD. Hemoglobin 9.9. No indication for transfusion  Hyperphosphatemia the setting of #1. Phosphorus 5.7. No indication for binders.    Less than half of the total split-share visit time needed to collect history, review the records and examine the patient was spent by me individually (10 minutes)    Jillyn Hidden Gauze   04/07/2023        Electronically signed by Claretta Fraise., MD at 04/07/2023 11:53 AM EST    Associated attestation - Claretta Fraise., MD - 04/07/2023 11:53 AM EST  Formatting of this note might be different from the original.  The patient was seen by me and examined. Agree with assessment and plan per nurse practitioner.     Nephrology consultation was obtained yesterday for management of end  stage renal disease and provision of hemodialysis and evaluation and management of pain over failed renal allografts in this 38 year old African-male who presented to the emergency department on 04/04/2022 after being brought from home due to development of hypotension. He had undergone outpatient hemodialysis earlier that day.    Outpatient hemodialysis is performed at the main facility in [place], [place] for management of end stage renal disease secondary to hypertension and renal allograft rejection. He is under the care of nephrologist Dr. Verlene Mayer in the outpatient setting.     He was experiencing right lower quadrant pain over his failed transplanted kidneys at the time of presentation to the emergency department. 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 Renal replacement therapy was resumed in 2024 following rejection of renal allografts.  Isotonic saline boluses were given in the emergency department. Intravenous vancomycin and cefepime have been started. Midodrine is being given for treatment of hypotension.    He continues to experience right lower quadrant pain that now radiates to his testicles. His urinary output is low. Bright red rectal bleeding developed yesterday. He denies experiencing nausea or vomiting.     Systolic blood pressure has ranged from 81 mmHg to 123 mmHg and diastolic blood pressure has ranged from 41 mmHg to 69 mmHg since presentation to the hospital. Temperature has ranged from 98.3 to 99.3 degrees.    His lung fields are clear to auscultation. Cardiac rhythm is regular. No murmur is audible. There is a firm tender mass in the right lower quadrant of his abdomen. No peripheral edema is present. His left forearm arteriovenous fistula is patent. Tunneled right internal jugular venous hemodialysis catheter is in place.     No indication is present for dialysis today. Hemodialysis will need to be performed tomorrow and continued thrice  weekly in the inpatient and outpatient settings.    Non-enhanced CT imaging of the abdomen and pelvis performed yesterday indicated that the renal allografts were edematous and no evidence for appendicitis or other acute intra-abdominal process was present. Glycosuria, hematuria, proteinuria, and pyuria were present on urinalysis and there has been no growth from a urine culture obtained yesterday. 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 Intravenous methylprednisolone in dosage of 500 mg will be given today for treatment of rejection. There is risk of renal allograft rupture which may cause hemodynamic collapse in this setting. Case discussed with attending physician Dr. Fidela Juneau. He should be transferred to a tertiary care center with transplant urologist to address need for removal of renal allografts. He received care in 2024 at the [facility] and should be transferred to that facility if possible. Shall consult urology.    Shall continue therapy using current dosage of midodrine for management of hypotension.    Hemoglobin is stable at 9.9 grams/dL today. Shall follow hemoglobin in the presence of bright red rectal bleeding.     Phosphorus is above upper limit of normal at 5.7 mg/dL. He takes sucroferric oxyhydroxide in the outpatient setting to reduce gastrointestinal phosphorus absorption in the presence of hyperphosphatemia due to end stage renal disease. This agent is not on the formulary of this hospital. Shall begin phosphorus binder therapy using sevelamer carbonate.

## 2023-04-07 NOTE — Progress Notes (Signed)
 Formatting of this note is different from the original.  HOSPITAL MEDICINE PROGRESS NOTE    Patient:  Roberto Rice  MRN:  [id number]  Room: 0A540/9W119J  Admit Date: 04/06/2023  Hospital Day:   LOS: 0 days   Current Date: 04/07/2023    Chief Complaint - follow-up for Sepsis secondary to UTI (CMS/HCC)     Hospital Course to Date: No notes on file    Subjective/Interval History:   No acute events overnight.  Af, VSS.    Seen and examined between 9 AM and noon.    Relevant ROS: Review of Systems   Respiratory:  Negative for shortness of breath.    Cardiovascular:  Negative for chest pain.   Gastrointestinal:         BRBPR       Physical Exam for 04/07/2023  Blood pressure 113/61, pulse 93, temperature 98.5 F (36.9 C), temperature source Oral, resp. rate 18, height 5' 11 (180.3 cm), weight 83.7 kg (184 lb 8.4 oz), SpO2 100%.  Physical Exam  Vitals reviewed.   Constitutional:       General: He is not in acute distress.  HENT:      Head: Normocephalic and atraumatic.   Cardiovascular:      Rate and Rhythm: Normal rate.   Pulmonary:      Effort: Pulmonary effort is normal. No respiratory distress.      Breath sounds: No stridor.   Neurological:      Mental Status: He is alert.     Labs and Imaging:  Recent Labs     04/07/23  1152 04/07/23  0709 04/07/23  0034 04/06/23  0053   WBC  --   --  8.3 9.8   RBC  --   --  3.02* 3.34*   HGB 8.7* 9.9* 9.4* 10.1*   HCT 26.5* 29.9* 28.3* 31.0*   MCV  --   --  93.8 92.9   PLATELETCNT  --   --  262 271   SODIUM  --   --  137 134*   POTASSIUM  --   --  4.3 3.5*   MAGNESIUM  --   --  2.0 1.9   PHOSPHORUS  --   --  5.7*  --    CHLORIDE  --   --  100* 95*   CO2  --   --  26 27   GLUCOSE  --   --  91 93   BUN  --   --  36* 23   CREATININE  --   --  11.2* 8.0*   CALCIUM  --   --  8.0* 8.9   TBILIRUBIN  --   --  0.4 0.5   ALP  --   --  66 68   AST  --   --  16 13   ALBUMIN  --   --  3.3 3.7   INR  --   --   --  1.1     Assessment and Plan     Hospital Problems and Relevant comorbid  conditions:  Principal Problem:    Sepsis secondary to UTI (CMS/HCC)  Active Problems:    Renal transplant, status post    Hypertension    ESRD (end stage renal disease) on dialysis (CMS/HCC)    * Sepsis secondary to UTI (CMS/HCC)  C/w empiric abx.  Follow cultures.  Blood cx x 2 NG x 24 hours.  Urine cx negative.  ESRD - nephrology following for dialysis.    Renal transplant - renal allografts edematous, d/w Dr. Jenetta Downer.  Recommends transfer to [facility] for transplant urologic evaluation.  IV solumedrol per renal.    Intermittent tachycardia - tele.  Was potentially arguing with someone when this happened earlier today.    BRBPR - Hb down some this afternoon, follow serial H&H.  GI consult pending.    GI/DVT ppx - PPI.  Heparin subQ.    High risk with potential renal transplant issues.  Awaiting acceptance at [facility].     Signed,  Marianne Sofia, MD  04/07/2023      Electronically signed by Marianne Sofia, MD at 04/07/2023 12:41 PM EST

## 2023-04-07 NOTE — Progress Notes (Signed)
Formatting of this note might be different from the original.  Pharmacy note: Vancomycin dosing  Goal level = </= 20 mcg/ml  Random level = 29.9 mcg/ml        A/P:  Level drawn appropriately.  Will plan to not order a dose of Vancomycin at this time.  Will continue to monitor and make dosing adjustments as needed.   Thank you,  Lorra Hals, [facility]  Electronically signed by Lorra Hals, RPH at 04/07/2023  1:58 AM EST

## 2023-04-07 NOTE — Unmapped External Note (Signed)
 Formatting of this note might be different from the original.    Problem: Discharge Planning  Goal: Knowledge of discharge instructions  Description: Interventions:  - Education, renal diet  - Medication reconciliation  - Education, post discharge follow up  - Education, when to call provider  Outcome: Met This Shift    Problem: Fluid Volume, Excess  Goal: Maintain appropriate body weight  Description: Instructions:  - Fluid restriction  - Education, fluid restriction  - Intake and output measurement  - Daily weight  Outcome: Met This Shift    Problem: Electrolyte Imbalance, Risk for (Renal Faliure)  Goal: Maintain a normal sinus heart rhythm with regular rate  Description: Interventions:  - Vitals/O2 sat/telemetry monitoring  Outcome: Met This Shift  Goal: Maintain normal serum electrolyte levels  Description: Interventions:  - Monitor laboratory data as ordered and report deviations to provider  Outcome: Met This Shift    Problem: Infection, Risk for Central Venous Catheter Associated Bloodstream Infection  Goal: Absence of central venous catheter-associated bloodstream infection  Description: Interventions:  - Optimal catheter site selection, with avoidance of the femoral vein for central venous access in adult patients  - Maximal barrier precautions upon insertion  - Hand hygiene  - Chlorhexidine skin antisepsis  - Central line needs assessment  Outcome: Met This Shift    Problem: Skin Integrity, Impaired, Risk for  Goal: Absence of pressure injury  Description: Interventions:  - Skin assessment  - Patient repositioning every 2 hours if decreased mobility  Outcome: Met This Shift    Problem: Infection, Risk for  Goal: Patient will be free from infection  Description: Interventions:  1. Monitor for signs and symptoms of infection: fever, increased WBC, burning with urination or chills  2. Monitor insertion site for redness, tenderness, or drainage  3. Hand hygiene per CDC guidelines  4. Discontinuation of  invasive devices when not needed  5. Place patient in appropriate isolation  Outcome: Met This Shift    Problem: Coping, Ineffective  Goal: Effective coping  Description: Interventions:  - Ineffective coping signs and symptoms assessment  - Consult to social services  - Cognitive emotional support  - Provide continuum of care to patient and family  - Education, healthy daily routines  - Education, disease process  Outcome: Met This Shift    Electronically signed by Fabio Asa, RN at 04/07/2023  6:29 AM EST

## 2023-04-07 NOTE — Progress Notes (Signed)
 Formatting of this note is different from the original.      Nephrology Follow-up Note    CC: Follow-up on ESRD    Interval history: Over the last 24 hours, symptoms/labs progression:  stable. Associated symptoms: no edema. Blood pressure stable    Physical Exam  Constitutional: negative for distress  Cardiovascular:  negative for edema  Pulmonary: negative for audible wheezing    Recent Labs     04/07/23  0709 04/07/23  0034 04/06/23  0053   WBC  --  8.3 9.8   RBC  --  3.02* 3.34*   HGB 9.9* 9.4* 10.1*   HCT 29.9* 28.3* 31.0*   MCV  --  93.8 92.9   PLATELETCNT  --  262 271   SODIUM  --  137 134*   OSMOLALITY  --  282 272   POTASSIUM  --  4.3 3.5*   MAGNESIUM  --  2.0 1.9   PHOSPHORUS  --  5.7*  --    CHLORIDE  --  100* 95*   CO2  --  26 27   ANIONGAP  --  11 12   GLUCOSE  --  91 93   BUN  --  36* 23   CREATININE  --  11.2* 8.0*   CALCIUM  --  8.0* 8.9   TBILIRUBIN  --  0.4 0.5   ALP  --  66 68   AST  --  16 13   ALTSGPT  --  5* <3*   ALBUMIN  --  3.3 3.7   INR  --   --  1.1   LIPASE  --   --  61     Assessment/plan  ESRD with hemodialysis typically on a Monday Wednesday Friday schedule. We'll plan for dialysis tomorrow per usual schedule  Hypotension. Improved with IV fluid bolus. Continue ProAmatine  Anemia of CKD. Hemoglobin 9.9. No indication for transfusion  Hyperphosphatemia the setting of #1. Phosphorus 5.7. No indication for binders.    Less than half of the total split-share visit time needed to collect history, review the records and examine the patient was spent by me individually (10 minutes)    Alm Rogue Gauze   04/07/2023        Electronically signed by Raenelle Lynwood ORN., MD at 04/07/2023  2:33 PM EST    Associated attestation - Raenelle Lynwood ORN., MD - 04/07/2023  2:33 PM EST  Formatting of this note might be different from the original.  The patient was seen by me and examined. Agree with assessment and plan per nurse practitioner.     Nephrology consultation was obtained yesterday for management of end  stage renal disease and provision of hemodialysis and evaluation and management of pain over failed renal allografts in this 38 year old African-male who presented to the emergency department on 04/04/2022 after being brought from home due to development of hypotension. He had undergone outpatient hemodialysis earlier that day.    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 He is under the care of nephrologist Dr. Janee Mcalpine in the outpatient setting.     He was experiencing right lower quadrant pain over his failed transplanted kidneys at the time of presentation to the emergency department. 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 Renal replacement therapy was resumed in 2024 following rejection of renal  allografts.     Isotonic saline boluses were given in the emergency department. Intravenous vancomycin  and cefepime have been started. Midodrine is being given for treatment of hypotension.    He continues to experience right lower quadrant pain that now radiates to his testicles. His urinary output is low. Bright red rectal bleeding developed yesterday. He denies experiencing nausea or vomiting.     Systolic blood pressure has ranged from 81 mmHg to 123 mmHg and diastolic blood pressure has ranged from 41 mmHg to 69 mmHg since presentation to the hospital. Temperature has ranged from 98.3 to 99.3 degrees.    His lung fields are clear to auscultation. Cardiac rhythm is regular. No murmur is audible. There is a firm tender mass in the right lower quadrant of his abdomen. No peripheral edema is present. His left forearm arteriovenous fistula is patent. Tunneled right internal jugular venous hemodialysis catheter is in place.     No indication is present for dialysis today. Hemodialysis will need to be performed tomorrow and continued thrice  weekly in the inpatient and outpatient settings.    Non-enhanced CT imaging of the abdomen and pelvis performed yesterday indicated that the renal allografts were edematous and no evidence for appendicitis or other acute intra-abdominal process was present. Glycosuria, hematuria, proteinuria, and pyuria were present on urinalysis and there has been no growth from a urine culture obtained yesterday. 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 Intravenous methylprednisolone  in dosage of 500 mg will be given today for treatment of rejection. There is risk of renal allograft rupture which may cause hemodynamic collapse in this setting. Case discussed with attending physician Dr. Camie Moats. He should be transferred to a tertiary care center with transplant urologist to address need for removal of renal allografts. He received care in 2024 at the [facility] and should be transferred to that facility if possible. Shall consult urology.    Shall continue therapy using current dosage of midodrine for management of hypotension.    Hemoglobin is stable at 9.9 grams/dL today. Shall follow hemoglobin in the presence of bright red rectal bleeding.     Phosphorus is above upper limit of normal at 5.7 mg/dL. He takes sucroferric oxyhydroxide in the outpatient setting to reduce gastrointestinal phosphorus absorption in the presence of hyperphosphatemia due to end stage renal disease. This agent is not on the formulary of this hospital. Shall begin phosphorus binder therapy using sevelamer carbonate.

## 2023-04-07 NOTE — Consults (Signed)
Associated Order(s): IP CONSULT TO NEPHROLOGY  Formatting of this note might be different from the original.  Please see Consult note done by Dr. Jenetta Downer  Electronically signed by Brion Aliment, MD at 04/07/2023 12:28 AM EST

## 2023-04-08 NOTE — Unmapped (Signed)
 Formatting of this note might be different from the original.  Handoff report given to [employer].  Questions answered per policy.    Electronically signed by Laurell Josephs, RN at 04/08/2023  7:44 PM EST

## 2023-04-08 NOTE — Progress Notes (Signed)
 Formatting of this note might be different from the original.  Transport here for patient  Electronically signed by Laurell Josephs, RN at 04/08/2023 12:03 PM EST

## 2023-04-08 NOTE — Unmapped External Note (Signed)
 Formatting of this note might be different from the original.    Problem: Discharge Planning  Goal: Knowledge of discharge instructions  Description: Interventions:  - Education, renal diet  - Medication reconciliation  - Education, post discharge follow up  - Education, when to call provider  Outcome: Ongoing    Problem: Fluid Volume, Excess  Goal: Maintain appropriate body weight  Description: Instructions:  - Fluid restriction  - Education, fluid restriction  - Intake and output measurement  - Daily weight  Outcome: Ongoing    Problem: Electrolyte Imbalance, Risk for (Renal Faliure)  Goal: Maintain a normal sinus heart rhythm with regular rate  Description: Interventions:  - Vitals/O2 sat/telemetry monitoring  Outcome: Ongoing  Goal: Maintain normal serum electrolyte levels  Description: Interventions:  - Monitor laboratory data as ordered and report deviations to provider  Outcome: Ongoing    Problem: Infection, Risk for Central Venous Catheter Associated Bloodstream Infection  Goal: Absence of central venous catheter-associated bloodstream infection  Description: Interventions:  - Optimal catheter site selection, with avoidance of the femoral vein for central venous access in adult patients  - Maximal barrier precautions upon insertion  - Hand hygiene  - Chlorhexidine skin antisepsis  - Central line needs assessment  Outcome: Ongoing    Problem: Coping, Ineffective  Goal: Effective coping  Description: Interventions:  - Ineffective coping signs and symptoms assessment  - Consult to social services  - Cognitive emotional support  - Provide continuum of care to patient and family  - Education, healthy daily routines  - Education, disease process  Outcome: Ongoing    Problem: Skin Integrity, Impaired, Risk for  Goal: Absence of pressure injury  Description: Interventions:  - Skin assessment  - Patient repositioning every 2 hours if decreased mobility  Outcome: Ongoing    Problem: Infection, Risk for  Goal: Patient  will be free from infection  Description: Interventions:  1. Monitor for signs and symptoms of infection: fever, increased WBC, burning with urination or chills  2. Monitor insertion site for redness, tenderness, or drainage  3. Hand hygiene per CDC guidelines  4. Discontinuation of invasive devices when not needed  5. Place patient in appropriate isolation  Outcome: Ongoing    Problem: Pain, Acute  Goal: Communication of presence of pain  Description: Interventions:  - Nonpharmacologic pain management  - Medication administration  - Education, pain scale  Outcome: Ongoing    Problem: Deep Venous Thrombosis, Risk of  Goal: Absence of deep venous thrombosis  Description: Interventions:  - Deep venous thrombosis risk assessment  - Ensure patient is receiving antithrombotic medication for VTE prophylaxis  - Activity promotion  - Graduated anti-embolic stocking management  - Intermittent pneumatic compression management.  Outcome: Ongoing    Problem: Falls, Moderate Risk For  Goal: Absence of falls  Description: Interventions:  - Non-skid footwear with ambulation  - Exercise program (if applicable) for strengthening  - Address the P's during each pt encounter (rounding); offer bathroom more frequently for patient on diuretics, bowel prep, etc.  - Siderails up X 2 (Upper left and upper right)  - Bed low position and wheels locked  - Chair/wheelchair locked  - Minimize line tethering  - Walkway free of clutter  - Nightlight on evening/night hours  - Keep door to pt room open (unless contraindication, isolation, etc.)  - Call light, phone, table, and/or other pt necessities in reach (pt MUST demonstrate ability to properly use call light)  - Appropriate fall bracelet on at all times  -  Provide pt/family with fall education on admission and transfers  - Reorient pt to environment as appropriate  - Remind pt/family to call for assistance with each encounter    Outcome: Ongoing    Electronically signed by Jackquline President, RN  at 04/08/2023  3:33 PM EST

## 2023-04-08 NOTE — Unmapped External Note (Signed)
 Formatting of this note is different from the original.  Nursing End of Shift Summary    Pertinent changes to patient conditions and/or care this shift: no changes      Vital Signs Weight: 86.8 kg (191 lb 6.4 oz) (04/08/23 0507)    Temp: 98.1 F (36.7 C)  Temp Source: Oral    Heart Rate:73    BP: 123/71    Respirations: 18   Oxygen Saturation SpO2: 100 %  O2 Delivery: Room air  O2 Device: None (Room air)  O2 Flow Rate (l/min): 0 l/min    Incentive Spirometry Incentive Spirometry: No      Diet Diet Renal;Cardiac; Fluid 1500cc; Pro 80G; NA 4000mg  (NAS); K 2000mg  ( )  Tolerated Diet: Yes   Intake/Output Totals  Intake/Output          04/07/23 0700 - 04/08/23 0659     [id number] 1900-0659 Total        Intake    P.O.  360  300 660    I.V.  --  0 0    Blood  --  0 0    Other  --  0 0    Total Intake 360 300 660      Output    Urine  --  0 0    Urine -- 0 0    Weight of Briefs in mL -- 0 0    Urine Occurrence 2 x 0 x 2 x    Emesis/NG output  --  0 0    Emesis -- 0 0    Emesis Occurrence -- 0 x 0 x    Other  --  0 0    Other -- 0 0    Stool  --  0 0    Stool Occurrence 1 x 0 x 1 x    Stool -- 0 0    Blood  --  0 0    Blood output -- 0 0    Total Output -- 0 0             Activity Activity: Bedrest  Level of Assistance: Assistance times one  Activity Tolerance: Tolerated Fair  Ambulation Attempts this Shift: Did not ambulate this shift    Telemetry Cardiac Rhythm: Normal Sinus Rhythm (04/07/23 0900)   Telemetry Abnormalities No     Labs No results found for: FUNCTIONAL, PLTAGGREG    Lab Results   Component Value Date    RBC 3.41 (L) 04/08/2023    WBC 8.1 04/08/2023    HCT 32.5 (L) 04/08/2023    HGB 10.7 (L) 04/08/2023    PLATELETCNT 304 04/08/2023    MCH 31.5 04/08/2023    MCHC 33.0 04/08/2023    MCV 95.3 04/08/2023    MPV 7.7 04/08/2023     Lab Results   Component Value Date    ALBUMIN 3.9 04/08/2023    SODIUM 135 04/08/2023    POTASSIUM 5.1 (H) 04/08/2023    CHLORIDE 100 (L) 04/08/2023    CO2 21 04/08/2023     ANIONGAP 14 04/08/2023    GLUCOSE 134 (H) 04/08/2023    CREATININE 13.4 (HH) 04/08/2023    BUN 50 (H) 04/08/2023    CALCIUM 9.1 04/08/2023    PROTEINTOTAL 8.5 (H) 04/08/2023    TBILIRUBIN 0.5 04/08/2023    ALP 70 04/08/2023    AST 17 04/08/2023    ALTSGPT 5 (L) 04/08/2023    OSMOLALITY 285 04/08/2023    AGRATIO 0.8 04/08/2023  BC 4 (L) 04/08/2023    ESTIMATEDGFR 4 04/08/2023         Taking all meds No   Any PRN meds given? No    Current IV Infusions     Diuretics      SDOH Screen Assessed? Not Assessed   Discharge Planning Home   Expected Discharge Date    Education Provided Other (Comment) fall prevention           Electronically signed by Ngwenya, Senziwe, RN at 04/08/2023  6:09 AM EST

## 2023-04-08 NOTE — Unmapped External Note (Signed)
 Formatting of this note might be different from the original.  Inpatient precert request    Admit Date:  04/07/2023    Member ID:  [id number]    ICD10:  N39.0  I95.9    Connell Pinal, RN  [facility]   Case Management  Phone [phone]             [phone] ex [phone]  Fax     [phone]      Electronically signed by Pinal Connell, RN at 04/08/2023  9:48 AM EST

## 2023-04-08 NOTE — Unmapped (Signed)
 Formatting of this note might be different from the original.  3 hour HD complete  1.5L UF removed  Pt tolerated well  VSS  Report given to [employer] signed by Sandrea Matte, RN at 04/08/2023  5:06 PM EST

## 2023-04-08 NOTE — Assessment & Plan Note (Signed)
 Associated Problem(s): Sepsis secondary to UTI (CMS/HCC)  Formatting of this note might be different from the original.  C/w empiric abx.  Follow cultures.  Blood cx x 2 NG. Urine cx negative.      ESRD - nephrology following for dialysis.    Renal transplant - renal allografts edematous. IV solumedrol per renal. Nephrology recommending transfer, [facility] on diversion. Patient refuses to go anywhere else.     BRBPR-resolved. GI consulted. Scopes tomorrow. Monitor CBC. Hgb stable.     GI/DVT ppx - PPI.  Heparin  subQ.  Home vs transfer on DC   Electronically signed by Dorice Raisin, APRN at 04/10/2023  2:53 PM EST

## 2023-04-08 NOTE — Unmapped External Note (Signed)
 Formatting of this note might be different from the original.    Problem: Discharge Planning  Goal: Knowledge of discharge instructions  Description: Interventions:  - Education, renal diet  - Medication reconciliation  - Education, post discharge follow up  - Education, when to call provider  Outcome: Ongoing    Problem: Fluid Volume, Excess  Goal: Maintain appropriate body weight  Description: Instructions:  - Fluid restriction  - Education, fluid restriction  - Intake and output measurement  - Daily weight  Outcome: Ongoing    Problem: Electrolyte Imbalance, Risk for (Renal Faliure)  Goal: Maintain a normal sinus heart rhythm with regular rate  Description: Interventions:  - Vitals/O2 sat/telemetry monitoring  Outcome: Ongoing  Goal: Maintain normal serum electrolyte levels  Description: Interventions:  - Monitor laboratory data as ordered and report deviations to provider  Outcome: Ongoing    Problem: Infection, Risk for Central Venous Catheter Associated Bloodstream Infection  Goal: Absence of central venous catheter-associated bloodstream infection  Description: Interventions:  - Optimal catheter site selection, with avoidance of the femoral vein for central venous access in adult patients  - Maximal barrier precautions upon insertion  - Hand hygiene  - Chlorhexidine skin antisepsis  - Central line needs assessment  Outcome: Ongoing    Problem: Coping, Ineffective  Goal: Effective coping  Description: Interventions:  - Ineffective coping signs and symptoms assessment  - Consult to social services  - Cognitive emotional support  - Provide continuum of care to patient and family  - Education, healthy daily routines  - Education, disease process  Outcome: Ongoing    Problem: Skin Integrity, Impaired, Risk for  Goal: Absence of pressure injury  Description: Interventions:  - Skin assessment  - Patient repositioning every 2 hours if decreased mobility  Outcome: Ongoing    Problem: Infection, Risk for  Goal: Patient  will be free from infection  Description: Interventions:  1. Monitor for signs and symptoms of infection: fever, increased WBC, burning with urination or chills  2. Monitor insertion site for redness, tenderness, or drainage  3. Hand hygiene per CDC guidelines  4. Discontinuation of invasive devices when not needed  5. Place patient in appropriate isolation  Outcome: Ongoing    Problem: Pain, Acute  Goal: Communication of presence of pain  Description: Interventions:  - Nonpharmacologic pain management  - Medication administration  - Education, pain scale  Outcome: Ongoing    Problem: Deep Venous Thrombosis, Risk of  Goal: Absence of deep venous thrombosis  Description: Interventions:  - Deep venous thrombosis risk assessment  - Ensure patient is receiving antithrombotic medication for VTE prophylaxis  - Activity promotion  - Graduated anti-embolic stocking management  - Intermittent pneumatic compression management.  Outcome: Ongoing    Electronically signed by Ngwenya, Senziwe, RN at 04/08/2023  3:15 AM EST

## 2023-04-08 NOTE — Progress Notes (Signed)
 Formatting of this note is different from the original.  HOSPITAL MEDICINE PROGRESS NOTE    Patient:  Roberto Rice  MRN:  [id number]  Room: 3G380/3G380J  Admit Date: 04/06/2023  Hospital Day:   LOS: 1 day   Current Date: 04/08/2023    Chief Complaint - follow-up for Sepsis secondary to UTI (CMS/HCC)     Hospital Course to Date: No notes on file    Subjective/Interval History: he is on room air.     Seen and examined between 8AM and noon.     Relevant ROS: Review of Systems   Respiratory:  Negative for shortness of breath.    Cardiovascular:  Negative for chest pain.       Physical Exam for 04/08/2023  Blood pressure 128/68, pulse 69, temperature 97.8 F (36.6 C), temperature source Oral, resp. rate 17, height 5' 11 (180.3 cm), weight 85.3 kg (188 lb 0.8 oz), SpO2 100%.  Physical Exam  Cardiovascular:      Rate and Rhythm: Normal rate and regular rhythm.   Pulmonary:      Breath sounds: Normal breath sounds.   Abdominal:      General: Bowel sounds are normal.   Musculoskeletal:      Cervical back: Neck supple.   Neurological:      Mental Status: He is alert.     Intake/Output          04/07/23 0700 - 04/08/23 0659 04/08/23 0700 - 04/09/23 0659     [id number] 8099-9340 Total 9299-8140 1900-0659 Total        Intake    P.O.  360  300 660  750  -- 750    I.V.  --  0 0  --  -- --    Blood  --  0 0  --  -- --    Other  --  0 0  500  -- 500    Total Intake 360 [phone] -- 1250      Output    Urine  --  0 0  --  -- --    Urine -- 0 0 -- -- --    Weight of Briefs in mL -- 0 0 -- -- --    Urine Occurrence 2 x 0 x 2 x -- -- --    Emesis/NG output  --  0 0  --  -- --    Emesis -- 0 0 -- -- --    Emesis Occurrence -- 0 x 0 x -- -- --    Other  --  0 0  2000  -- 2000    Other -- 0 0 -- -- --    HD Total Volume Removed -- -- -- 2000 -- 2000    Stool  --  0 0  --  -- --    Stool Occurrence 1 x 0 x 1 x -- -- --    Stool -- 0 0 -- -- --    Blood  --  0 0  --  -- --    Blood output -- 0 0 -- -- --    Total Output -- 0 0 2000 -- 2000          Last 5 Weights    04/06/23 0026 04/07/23 0500 04/08/23 0507 04/08/23 1215   Weight: 86.1 kg (189 lb 13.1 oz) 83.7 kg (184 lb 8.4 oz) 86.8 kg (191 lb 6.4 oz) 86.8 kg (191 lb 5.8 oz)    04/08/23 1535  Weight: 85.3 kg (188 lb 0.8 oz)     Labs and Imaging:  Recent Labs     04/08/23  1158 04/08/23  0004 04/07/23  1835 04/07/23  1152 04/07/23  0709 04/07/23  0034 04/06/23  0053   WBC  --  8.1  --   --   --  8.3 9.8   RBC  --  3.41*  --   --   --  3.02* 3.34*   HGB 8.3* 10.7* 10.0* 8.7* 9.9* 9.4* 10.1*   HCT 25.6* 32.5* 30.2* 26.5* 29.9* 28.3* 31.0*   MCV  --  95.3  --   --   --  93.8 92.9   PLATELETCNT  --  304  --   --   --  262 271   SODIUM  --  135  --   --   --  137 134*   POTASSIUM  --  5.1*  --   --   --  4.3 3.5*   MAGNESIUM  --  2.0  --   --   --  2.0 1.9   PHOSPHORUS  --  4.7*  --   --   --  5.7*  --    CHLORIDE  --  100*  --   --   --  100* 95*   CO2  --  21  --   --   --  26 27   GLUCOSE  --  134*  --   --   --  91 93   BUN  --  50*  --   --   --  36* 23   CREATININE  --  13.4*  --   --   --  11.2* 8.0*   CALCIUM  --  9.1  --   --   --  8.0* 8.9   TBILIRUBIN  --  0.5  --   --   --  0.4 0.5   ALP  --  70  --   --   --  66 68   AST  --  17  --   --   --  16 13   ALBUMIN  --  3.9  --   --   --  3.3 3.7   INR  --   --   --   --   --   --  1.1     PROCALCITONIN, QN, S   Date Value Ref Range Status   04/08/2023 1.35 ng/mL Final     Comment:     .          <0.05 ng/mL  Healthy individuals.          <0.50 ng/mL  Systemic infection (sepsis) is not likely.    0.50 - 2.00 ng/mL  Systemic infection (sepsis) is possible, but other                           conditions are known to induce PCT as well.   2.00 - 10.00 ng/mL  Systemic infection (sepsis) is likely, unless other                           causes are known.        >=10.00 ng/mL  Important systemic inflammatory response, almost  exclusively due to severe bacterial sepsis or                          septic shock.        Imaging and  Procedures  reviewed    Assessment and Plan     Hospital Problems and Relevant comorbid conditions:  Principal Problem:    Sepsis secondary to UTI (CMS/HCC)  Active Problems:    Renal transplant, status post    Hypertension    ESRD (end stage renal disease) on dialysis (CMS/HCC)    * Sepsis secondary to UTI (CMS/HCC)  C/w empiric abx.  Follow cultures.  Blood cx x 2 NG. Urine cx negative.      ESRD - nephrology following for dialysis.    Renal transplant - renal allografts edematous. IV solumedrol per renal. Nephrology recommending transfer, [facility] on diversion. Patient refuses to go anywhere else.     BRBPR-resolved. GI consulted. No plans for scopes at this time. Monitor CBC. Hgb stable.     GI/DVT ppx - PPI.  Heparin  subQ.  Home vs transfer on DC     Diet Renal;Cardiac; Fluid 1500cc; Pro 80G; NA 4000mg  (NAS); K 2000mg  ( )    Patient and/or family was updated on diagnoses, procedures, tests, results, and plan of care.    I, individually, spent less than half of the total split-share visit time (10 minutes) needed to collect history, review the records and examine the patient.     ---  Assessment and plan generated using problem oriented charting. I have personally reviewed the previous A/P notes and have included what is applicable today.    Medications reviewed. Labs reviewed    Signed,  Harlene Ruff, APRN  04/08/2023    [id number]    Pt seen initially by Advanced Practice Provider, discussed with me, and then I independently evaluated the patient, reviewed labs, imaging, medications and performed a physical exam. A summary is documented below.    Subjective: Pt denies chest pain or soa.  No n/v/d.  Discussed possible transfer to another transplant center besides [facility] since they are on diversion    patient declines transfer to any other places.   Explained this is high risk and may result in him worsening or even death.   He understants this and still declines transfer any other place.      Exam/Pertinent  findings:     Principal Problem:    Sepsis secondary to UTI (CMS/HCC)  Active Problems:    Renal transplant, status post    Hypertension    ESRD (end stage renal disease) on dialysis (CMS/HCC)  Anemia    Brbpr,  gi consulted.  Trend hbs.   Gi, nephrology, urology consulted  Cont iv abx.    High risk, complexity with iv vanc, steroids, 5 active issues     I individually spent more than half of the total time (45 minutes) needed to generate this split-share documentation. I performed an independent review of the records before the face-to-face encounter and I independently collected history and examined the patient. The medical decision making was generated largely by me based on my own review of the findings, face-to-face time with patient and/or family, collaboration with specialists and reviewing the clinical, laboratory and imaging facts to generate a care plan.    Donnice Lynwood Barnacle, MD  04/08/2023      Electronically signed by Barnacle Donnice Lynwood, MD at 04/08/2023  6:44 PM EST

## 2023-04-08 NOTE — Unmapped External Note (Signed)
 Formatting of this note might be different from the original.  Roberto Rice    Test Name: CR  Results:  13.4  04/08/2023  Time: 0004  Received from: lab  R/V by: Annabella, RN    (Required: Attempt notification within 30 minutes or document reason for no notification)    Physician: Tenny  04/08/2023  Time: 0004  Notified: No - Due to Other: dialysis patient       Electronically signed by Tanda Annabella, RN at 04/08/2023  1:38 AM EST

## 2023-04-08 NOTE — Progress Notes (Signed)
 Formatting of this note is different from the original.  Images from the original note were not included.      [facility] Nephrology & Hypertension  Follow-Up Note    LOS: 1    CC: Follow-up on ESRD    Clinical history: Patient initially admitted 04/06/2023 for sepsis and hypotension from UTI.     Interval history: Over the last 24 hours, symptoms/labs progression: improved. Associated symptoms: none.  No urine output documented in last 24 hours. Hemodynamically stable.       Review of Systems:   Constitutional: No fever/chills.   Respiratory: No shortness of breath. No coughs/colds.   Cardiac: No chest pain. No orthopnea. No palpitations. No diaphoresis.    Physical Exam:  Vitals:    04/08/23 0023 04/08/23 0414 04/08/23 0507 04/08/23 0754   BP: 142/79 123/71  (!) 137/93   BP Location: Right Upper Extremity Right Upper Extremity  Right Upper Extremity   Patient Position: Sitting Semi Fowlers  Sitting   Pulse: 69 73  69   Resp:  18  18   Temp: 98.2 F (36.8 C) 98.1 F (36.7 C)  97.3 F (36.3 C)   TempSrc: Oral Oral  Oral   SpO2: 100% 100%  100%   Weight:   86.8 kg (191 lb 6.4 oz)    Height:         Last 5 Weights    04/06/23 0026 04/07/23 0500 04/08/23 0507   Weight: 86.1 kg (189 lb 13.1 oz) 83.7 kg (184 lb 8.4 oz) 86.8 kg (191 lb 6.4 oz)     Intake/Output Summary (Last 24 hours) at 04/08/2023 0801  Last data filed at 04/08/2023 0414  Gross per 24 hour   Intake 660 ml   Output 0 ml   Net 660 ml     GENERAL: Not in acute distress. Appears comfortable.  LUNGS: Generally clear to auscultation bilaterally. No crackles.   MUSCULOSKELETAL: No dependent edema.    NEUROLOGIC: Awake. No myoclonus.  ACCESS: Left chest wall tunnel cath. LUE AVF.     Labs:  Recent Labs     04/08/23  1158 04/08/23  0004 04/07/23  1835 04/07/23  1152 04/07/23  0709 04/07/23  0034 04/06/23  0053   WBC  --  8.1  --   --   --  8.3 9.8   RBC  --  3.41*  --   --   --  3.02* 3.34*   HGB 8.3* 10.7* 10.0* 8.7* 9.9* 9.4* 10.1*   HCT 25.6* 32.5* 30.2* 26.5* 29.9*  28.3* 31.0*   MCV  --  95.3  --   --   --  93.8 92.9   PLATELETCNT  --  304  --   --   --  262 271   SODIUM  --  135  --   --   --  137 134*   OSMOLALITY  --  285  --   --   --  282 272   POTASSIUM  --  5.1*  --   --   --  4.3 3.5*   MAGNESIUM  --  2.0  --   --   --  2.0 1.9   PHOSPHORUS  --  4.7*  --   --   --  5.7*  --    CHLORIDE  --  100*  --   --   --  100* 95*   CO2  --  21  --   --   --  26 27   ANIONGAP  --  14  --   --   --  11 12   GLUCOSE  --  134*  --   --   --  91 93   BUN  --  50*  --   --   --  36* 23   CREATININE  --  13.4*  --   --   --  11.2* 8.0*   CALCIUM  --  9.1  --   --   --  8.0* 8.9   TBILIRUBIN  --  0.5  --   --   --  0.4 0.5   ALP  --  70  --   --   --  66 68   AST  --  17  --   --   --  16 13   ALTSGPT  --  5*  --   --   --  5* <3*   ALBUMIN  --  3.9  --   --   --  3.3 3.7   INR  --   --   --   --   --   --  1.1   LIPASE  --   --   --   --   --   --  61     Assessment / Plan:   ESRD (stable). (Transplant rejection) Patient gets hemodialysis at [facility] on MWF under management of Dr. Lesta.  Plan for next hemodialysis treatment today per normal schedule. Renal Allografts edematous- plan for transfer to [facility]. Avoid nephrotoxin exposure, including but not limited to NSAIDs, IV dye, MRI gadolinium and Fleets.    Hypotension (improving). Continue on midodrine therapy.  Keep SBP > 90-100 mm hg if possible. Continue to monitor.   Anemia in CKD/acute blood loss (improving). Hgb goal of 10-11 g/dL with advanced kidney disease. Current Hgb 10.7 g/dL, at goal. GI consulted for GI bleeding. No indications for transfusion. Due to national blood shortage transfusion only if Hgb < 8 g/dL.   Hyperkalemia (new). Likely d/t impaired potassium clearance with advanced renal failure. Potassium is currently 5.1 mmol/L. Should improve with scheduled dialysis treatment today.   Hyperphosphatemia (improving). Likely due to impaired renal phosphorus clearance in the setting of renal failure. Current  phosphorous is 4.7 mg/dL.  Should continue to stabilize with regular treatment today.   GI bleeding. GI following. Further evaluation and management per GI team.   Acute Cystitis. Further evaluation and management per primary team.   Medications. Please dose all medications for CrCl<10 ml/min.     Patient and/or family was updated on diagnoses, procedures, tests, results, and plan of care.     Less than half of the total split-share visit time needed to collect history, review the records and examine the patient was spent by me individually (10 minutes).     Voice transcription technology was used for dictation of this note and sound-alike words might be erroneously placed despite reviewing the note for accuracy. Errors in dictation may reflect use of voice recognition software and not all errors in transcription may have been detected prior to signing.    Harlene Dakins   [facility] Nephrology & Hypertension  04/08/2023    I individually spent 11 minutes, more than half of the total time needed to generate this split-share documentation. I performed an independent review of the records before the face-to-face encounter, and I independently collected history and examined the patient. The medical decision making  was generated largely by me based on my own review of the findings, face-to-face time with patient and/or family, and using my nephrology expertise in reviewing the clinical and laboratory facts to generate a care plan.     I independently evaluated the patient, performed a physical examination, and review of the history, labs and medications. I reviewed the findings of the nurse practitioner, and my pertinent findings are as follows:    On my exam: negative for distress. No edema in the lower extremities.   Assessment/Plan:   ESRD on hemodialysis MWF.  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  Awaiting transfer to a tertiary care center for further assessment including  the possibility of removing the kidney graft  Abdominal pain seems to have improved today.  There is risk of graft rupture.  Continue steroids for now.  Second dose of Solu-Medrol  today.    Janee Mcalpine, M.D.     Voice transcription technology was used for dictation of this note and sound-alike words might be erroneously placed despite reviewing the note for accuracy.  Errors in dictation may reflect use of voice recognition software and not all errors in transcription may have been detected prior to signing.     Electronically signed by Mcalpine Janee, MD at 04/08/2023  3:39 PM EST

## 2023-04-08 NOTE — Progress Notes (Signed)
 Formatting of this note might be different from the original.  Patient back to floor   Electronically signed by Laurell Josephs, RN at 04/08/2023  4:10 PM EST

## 2023-04-09 NOTE — Unmapped External Note (Signed)
 Formatting of this note might be different from the original.    Problem: Discharge Planning  Goal: Knowledge of discharge instructions  Description: Interventions:  - Education, renal diet  - Medication reconciliation  - Education, post discharge follow up  - Education, when to call provider  Outcome: Ongoing    Problem: Fluid Volume, Excess  Goal: Maintain appropriate body weight  Description: Instructions:  - Fluid restriction  - Education, fluid restriction  - Intake and output measurement  - Daily weight  Outcome: Ongoing    Problem: Electrolyte Imbalance, Risk for (Renal Faliure)  Goal: Maintain a normal sinus heart rhythm with regular rate  Description: Interventions:  - Vitals/O2 sat/telemetry monitoring  Outcome: Ongoing  Goal: Maintain normal serum electrolyte levels  Description: Interventions:  - Monitor laboratory data as ordered and report deviations to provider  Outcome: Ongoing    Problem: Infection, Risk for Central Venous Catheter Associated Bloodstream Infection  Goal: Absence of central venous catheter-associated bloodstream infection  Description: Interventions:  - Optimal catheter site selection, with avoidance of the femoral vein for central venous access in adult patients  - Maximal barrier precautions upon insertion  - Hand hygiene  - Chlorhexidine skin antisepsis  - Central line needs assessment  Outcome: Ongoing    Problem: Coping, Ineffective  Goal: Effective coping  Description: Interventions:  - Ineffective coping signs and symptoms assessment  - Consult to social services  - Cognitive emotional support  - Provide continuum of care to patient and family  - Education, healthy daily routines  - Education, disease process  Outcome: Ongoing    Problem: Skin Integrity, Impaired, Risk for  Goal: Absence of pressure injury  Description: Interventions:  - Skin assessment  - Patient repositioning every 2 hours if decreased mobility  Outcome: Ongoing    Problem: Infection, Risk for  Goal: Patient  will be free from infection  Description: Interventions:  1. Monitor for signs and symptoms of infection: fever, increased WBC, burning with urination or chills  2. Monitor insertion site for redness, tenderness, or drainage  3. Hand hygiene per CDC guidelines  4. Discontinuation of invasive devices when not needed  5. Place patient in appropriate isolation  Outcome: Ongoing    Problem: Pain, Acute  Goal: Communication of presence of pain  Description: Interventions:  - Nonpharmacologic pain management  - Medication administration  - Education, pain scale  Outcome: Ongoing    Problem: Deep Venous Thrombosis, Risk of  Goal: Absence of deep venous thrombosis  Description: Interventions:  - Deep venous thrombosis risk assessment  - Ensure patient is receiving antithrombotic medication for VTE prophylaxis  - Activity promotion  - Graduated anti-embolic stocking management  - Intermittent pneumatic compression management.  Outcome: Ongoing    Problem: Falls, Moderate Risk For  Goal: Absence of falls  Description: Interventions:  - Non-skid footwear with ambulation  - Exercise program (if applicable) for strengthening  - Address the P's during each pt encounter (rounding); offer bathroom more frequently for patient on diuretics, bowel prep, etc.  - Siderails up X 2 (Upper left and upper right)  - Bed low position and wheels locked  - Chair/wheelchair locked  - Minimize line tethering  - Walkway free of clutter  - Nightlight on evening/night hours  - Keep door to pt room open (unless contraindication, isolation, etc.)  - Call light, phone, table, and/or other pt necessities in reach (pt MUST demonstrate ability to properly use call light)  - Appropriate fall bracelet on at all times  -  Provide pt/family with fall education on admission and transfers  - Reorient pt to environment as appropriate  - Remind pt/family to call for assistance with each encounter    Outcome: Ongoing    Electronically signed by Erling Canning, LPN  at 98/85/7974  3:42 AM EST

## 2023-04-09 NOTE — Progress Notes (Signed)
 Formatting of this note is different from the original.  Progress Note    Patient:Roberto Rice FMW:[id number]   DOB:1985/08/26    Admit Date:04/06/2023 LOS: LOS: 3 days      Subjective:   Chief Complaint:   f/u rectal bleed,  blood loss anemia, ESRD on dialysis, sepsis secondary to UTI  Interval History: Hb down to 8.1   bleeding has stopped , agrees for GI w/u    Objective:   Last Recorded Vital Signs: Temp: 98.5 F (36.9 C)  Heart Rate (Monitor): 96  Pulse: 64  BP: 139/86  Respirations: 18  SpO2: 99 %  O2 Flow Rate (l/min): 0 l/min    Pertinent Labs:    Component      Latest Ref Rng 04/08/2023  6:29 PM 04/09/2023  12:39 AM 04/09/2023  6:57 AM 04/09/2023  12:25 PM   WBC      4.5 - 11.0 10*3/uL  11.8 (H)      RBC      4.50 - 5.90 10*6/uL  2.82 (L)      HGB      13.5 - 17.5 g/dL 89.9 (L)  8.8 (L)  8.9 (L)  8.1 (L)    HCT      37.0 - 53.0 % 30.9 (L)  26.5 (L)  27.4 (L)  24.8 (L)    MCV      80.0 - 100.0 fL  93.8      MCHC      32.0 - 36.0 g/dL  66.6      MCH      73.9 - 34.0 pg  31.2      RDW      10.7 - 18.7 %  16.6      MPV      6.5 - 10.0 fL  7.8      Platelet Cnt      150 - 450 10*3/uL  289      Differential Type  Auto      Neutrophils      35.0 - 66.0 %  88.6 (H)      Lymphocytes      24.0 - 44.0 %  6.3 (L)      Monocytes      2.1 - 13.3 %  5.0      Eosinophils      0.3 - 5.0 %  0.0 (L)      Basophils      0.0 - 1.0 %  0.1      Neutrophils Abs      1.5 - 8.5 10*3/uL  10.5 (H)      Lymphocytes Abs      1.1 - 5.0 10*3/uL  0.7 (L)      Monocytes Abs      0.0 - 1.4 10*3/uL  0.6      Eosinophils Abs      0.0 - 0.5 10*3/uL  0.0      Basophils Abs      0.0 - 0.1 10*3/uL  0.0      SODIUM      135 - 145 mmol/L  136      POTASSIUM      3.6 - 5.0 mmol/L  5.0      CHLORIDE      101 - 111 mmol/L  99 (L)      CO2      21 - 31 mmol/L  23      ANION GAP  14  GLUCOSE      70 - 110 mg/dL  887 (H)      Creatinine      0.6 - 1.2 mg/dL  9.7 (HH)      BUN      2 - 32 mg/dL  46 (H)      CALCIUM      8.5 - 10.5 mg/dL  8.0 (L)       PROTEIN TOTAL      6.1 - 7.8 g/dL  7.4      Albumin      3.2 - 5.0 g/dL  3.5      T BILIRUBIN      0.2 - 1.0 mg/dL  0.3      ALP      42 - 121 [iU]/L  66      AST      10 - 42 [iU]/L  12      ALT      10 - 60 [iU]/L  6 (L)      OSMOLALITY      266 - 309   285      A/G Ratio  0.9      B/C      10 - 20   5 (L)      ESTIMATED GFR      mL/min  6        Legend:  (L) Low  (H) High  (HH) High Panic    Imaging:Patient has no current imaging    Physical Exam:  General Appearance: alert, no distress, appears stated age  Lungs:  clear to auscultation bilaterally  Heart:  regular rate and rhythm, S1, S2 normal  Extremities:  left arm AV fistula  Abdomen: soft, non-tender.  Bowel sounds normal.  No masses,  no organomegaly    Principal Problem:    Sepsis secondary to UTI (CMS/HCC)  Active Problems:    Renal transplant, status post    Hypertension    ESRD (end stage renal disease) on dialysis (CMS/HCC)    Assessment:    1. Rectal bleed most likely colon avms   2. Blood loss anemia/drop in Hb      3. ESRD on HD , transplant rejected    4. Sepsis/UTI    Plan:    1. PRBC when Hb < 8G       2. Agreeable for repeat colon and pill cam  3. Will plan for thursday        Signed:  Rome Florence, M.D., MD  04/10/2023  Electronically signed by Florence Rome, MD at 04/10/2023  4:42 AM EST

## 2023-04-09 NOTE — Progress Notes (Signed)
 Formatting of this note is different from the original.  HOSPITAL MEDICINE PROGRESS NOTE    Patient:  Roberto Rice  MRN:  [id number]  Room: 3G380/3G380J  Admit Date: 04/06/2023  Hospital Day:   LOS: 2 days   Current Date: 04/09/2023    Chief Complaint - follow-up for Sepsis secondary to UTI (CMS/HCC)     Hospital Course to Date: No notes on file    Subjective/Interval History: he is on room air.     Seen and examined between 8AM and noon.     Relevant ROS: Review of Systems   Respiratory:  Negative for shortness of breath.    Cardiovascular:  Negative for chest pain.       Physical Exam for 04/09/2023  Blood pressure 134/89, pulse 59, temperature 97.7 F (36.5 C), temperature source Oral, resp. rate 18, height 5' 11 (180.3 cm), weight 87.3 kg (192 lb 6.4 oz), SpO2 100%.  Physical Exam  Cardiovascular:      Rate and Rhythm: Normal rate and regular rhythm.   Pulmonary:      Breath sounds: Normal breath sounds.   Abdominal:      General: Bowel sounds are normal.   Musculoskeletal:      Cervical back: Neck supple.   Neurological:      Mental Status: He is alert.     Intake/Output          04/08/23 0700 - 04/09/23 0659 04/09/23 0700 - 04/10/23 0659     [id number] 1900-0659 Total 9299-8140 1900-0659 Total        Intake    P.O.  750  360 1110  120  -- 120    Other  500  -- 500  --  -- --    Total Intake 1250 360 1610 120 -- 120      Output    Urine  --  -- --  --  -- --    Urine Occurrence -- 3 x 3 x -- -- --    Other  2000  -- 2000  --  -- --    HD Total Volume Removed 2000 -- 2000 -- -- --    Stool  --  -- --  --  -- --    Stool Occurrence -- 0 x 0 x -- -- --    Total Output 2000 -- 2000 -- -- --         Last 5 Weights    04/07/23 0500 04/08/23 0507 04/08/23 1215 04/08/23 1535   Weight: 83.7 kg (184 lb 8.4 oz) 86.8 kg (191 lb 6.4 oz) 86.8 kg (191 lb 5.8 oz) 85.3 kg (188 lb 0.8 oz)    04/09/23 0601   Weight: 87.3 kg (192 lb 6.4 oz)     Labs and Imaging:  Recent Labs     04/09/23  1225 04/09/23  0657 04/09/23  0039  04/08/23  1829 04/08/23  1158 04/08/23  0004 04/07/23  1835 04/07/23  1152 04/07/23  0709 04/07/23  0034   WBC  --   --  11.8*  --   --  8.1  --   --   --  8.3   RBC  --   --  2.82*  --   --  3.41*  --   --   --  3.02*   HGB 8.1* 8.9* 8.8* 10.0* 8.3* 10.7* 10.0* 8.7* 9.9* 9.4*   HCT 24.8* 27.4* 26.5* 30.9* 25.6* 32.5* 30.2* 26.5* 29.9* 28.3*   MCV  --   --  93.8  --   --  95.3  --   --   --  93.8   PLATELETCNT  --   --  289  --   --  304  --   --   --  262   SODIUM  --   --  136  --   --  135  --   --   --  137   POTASSIUM  --   --  5.0  --   --  5.1*  --   --   --  4.3   MAGNESIUM  --   --  1.9  --   --  2.0  --   --   --  2.0   PHOSPHORUS  --   --  6.6*  --   --  4.7*  --   --   --  5.7*   CHLORIDE  --   --  99*  --   --  100*  --   --   --  100*   CO2  --   --  23  --   --  21  --   --   --  26   GLUCOSE  --   --  112*  --   --  134*  --   --   --  91   BUN  --   --  46*  --   --  50*  --   --   --  36*   CREATININE  --   --  9.7*  --   --  13.4*  --   --   --  11.2*   CALCIUM  --   --  8.0*  --   --  9.1  --   --   --  8.0*   TBILIRUBIN  --   --  0.3  --   --  0.5  --   --   --  0.4   ALP  --   --  66  --   --  70  --   --   --  66   AST  --   --  12  --   --  17  --   --   --  16   ALBUMIN  --   --  3.5  --   --  3.9  --   --   --  3.3     PROCALCITONIN, QN, S   Date Value Ref Range Status   04/09/2023 0.95 ng/mL Final     Comment:     .          <0.05 ng/mL  Healthy individuals.          <0.50 ng/mL  Systemic infection (sepsis) is not likely.    0.50 - 2.00 ng/mL  Systemic infection (sepsis) is possible, but other                           conditions are known to induce PCT as well.   2.00 - 10.00 ng/mL  Systemic infection (sepsis) is likely, unless other                           causes are known.        >=10.00 ng/mL  Important systemic inflammatory response, almost  exclusively due to severe bacterial sepsis or                          septic shock.        Imaging and  Procedures  reviewed    Assessment and Plan     Hospital Problems and Relevant comorbid conditions:  Principal Problem:    Sepsis secondary to UTI (CMS/HCC)  Active Problems:    Renal transplant, status post    Hypertension    ESRD (end stage renal disease) on dialysis (CMS/HCC)    * Sepsis secondary to UTI (CMS/HCC)  C/w empiric abx.  Follow cultures.  Blood cx x 2 NG. Urine cx negative.      ESRD - nephrology following for dialysis.    Renal transplant - renal allografts edematous. IV solumedrol per renal. Nephrology recommending transfer, [facility] on diversion. Patient refuses to go anywhere else.     BRBPR-resolved. GI consulted. No plans for scopes at this time. Monitor CBC. Hgb stable.     GI/DVT ppx - PPI.  Heparin  subQ.  Home vs transfer on DC     Diet Renal;Cardiac; Fluid 1500cc; Pro 80G; NA 4000mg  (NAS); K 2000mg  ( )    Patient and/or family was updated on diagnoses, procedures, tests, results, and plan of care.    I, individually, spent less than half of the total split-share visit time (10 minutes) needed to collect history, review the records and examine the patient.     ---  Assessment and plan generated using problem oriented charting. I have personally reviewed the previous A/P notes and have included what is applicable today.    Medications reviewed. Labs reviewed    Signed,  Harlene Ruff, APRN  04/09/2023    [id number]    Pt seen initially by Advanced Practice Provider, discussed with me, and then I independently evaluated the patient, reviewed labs, imaging, medications and performed a physical exam. A summary is documented below.    Subjective: Pt denies chest pain or soa.  No n/v/d. No abd pain.  Patient still declining transfer to any other place besides [facility]     Exam/Pertinent findings: alert, cv rrr,lung cta, abd nt, active bowel sounds, neuro axox3     Principal Problem:    Sepsis secondary to UTI (CMS/HCC)  Active Problems:    Renal transplant, status post    Hypertension    ESRD (end stage  renal disease) on dialysis (CMS/HCC)  Illene Hillis Berke, nephrology, urology consulted  Nephrology dw [facility],  patient has been accepted but there are not beds,  possible open been in couple days.     Giving high dose iv steroids today.   Vasc consulted.    Plan to stop vanc if mrsa pcr neg   High risk, complexity with iv steroids, iv vanc, 4 active issues     I individually spent more than half of the total time (35 minutes) needed to generate this split-share documentation. I performed an independent review of the records before the face-to-face encounter and I independently collected history and examined the patient. The medical decision making was generated largely by me based on my own review of the findings, face-to-face time with patient and/or family, collaboration with specialists and reviewing the clinical, laboratory and imaging facts to generate a care plan.    Donnice Lynwood Barnacle, MD  04/09/2023      Electronically signed by Barnacle Donnice Lynwood,  MD at 04/09/2023  6:36 PM EST

## 2023-04-09 NOTE — Progress Notes (Signed)
 Formatting of this note might be different from the original.  One time dose of Solu-MEDROL  200mg  IV held at this time per Dr. Lesta. 500mg  Solu-MEDROL  IV already given. See MAR for details.   Electronically signed by Charleen Rake, LPN at 98/85/7974  4:44 PM EST

## 2023-04-09 NOTE — Unmapped External Note (Signed)
 Formatting of this note is different from the original.  Nursing End of Shift Summary    Pertinent changes to patient conditions and/or care this shift: None      Vital Signs Weight: 85.3 kg (188 lb 0.8 oz) (04/08/23 1535)    Temp: 97.7 F (36.5 C)  Temp Source: Oral    Heart Rate:75    BP: 129/87    Respirations: 18   Oxygen Saturation SpO2: 99 %  O2 Delivery: Room air  O2 Device: None (Room air)  O2 Flow Rate (l/min): 0 l/min    Incentive Spirometry Incentive Spirometry: No      Diet Diet Renal;Cardiac; Fluid 1500cc; Pro 80G; NA 4000mg  (NAS); K 2000mg  ( )  Tolerated Diet: Yes   Intake/Output Totals  Intake/Output          04/08/23 0700 - 04/09/23 0659     9299-8140 1900-0659 Total        Intake    P.O.  750  360 1110    Other  500  -- 500    Total Intake 1250 360 1610      Output    Urine  --  -- --    Urine Occurrence -- 3 x 3 x    Other  2000  -- 2000    HD Total Volume Removed 2000 -- 2000    Stool  --  -- --    Stool Occurrence -- 0 x 0 x    Total Output 2000 -- 2000             Activity Activity: Up ad lib  Level of Assistance: Independent  Activity Tolerance: Tolerated Well  Ambulation Attempts this Shift: Did not ambulate this shift    Telemetry Cardiac Rhythm: Normal Sinus Rhythm (04/08/23 1900)   Telemetry Abnormalities No     Labs No results found for: FUNCTIONAL, PLTAGGREG    Lab Results   Component Value Date    RBC 2.82 (L) 04/09/2023    WBC 11.8 (H) 04/09/2023    HCT 26.5 (L) 04/09/2023    HGB 8.8 (L) 04/09/2023    PLATELETCNT 289 04/09/2023    MCH 31.2 04/09/2023    MCHC 33.3 04/09/2023    MCV 93.8 04/09/2023    MPV 7.8 04/09/2023     Lab Results   Component Value Date    ALBUMIN 3.5 04/09/2023    SODIUM 136 04/09/2023    POTASSIUM 5.0 04/09/2023    CHLORIDE 99 (L) 04/09/2023    CO2 23 04/09/2023    ANIONGAP 14 04/09/2023    GLUCOSE 112 (H) 04/09/2023    CREATININE 9.7 (HH) 04/09/2023    BUN 46 (H) 04/09/2023    CALCIUM 8.0 (L) 04/09/2023    PROTEINTOTAL 7.4 04/09/2023    TBILIRUBIN 0.3  04/09/2023    ALP 66 04/09/2023    AST 12 04/09/2023    ALTSGPT 6 (L) 04/09/2023    OSMOLALITY 285 04/09/2023    AGRATIO 0.9 04/09/2023    BC 5 (L) 04/09/2023    ESTIMATEDGFR 6 04/09/2023         Taking all meds Yes   Any PRN meds given? No    Current IV Infusions     Diuretics      SDOH Screen Assessed? Not Assessed   Discharge Planning Home   Expected Discharge Date    Education Provided            Electronically signed by Erling Canning, LPN at 98/85/7974  6:39 AM  EST

## 2023-04-09 NOTE — Unmapped External Note (Signed)
 Formatting of this note might be different from the original.    Problem: Discharge Planning  Goal: Knowledge of discharge instructions  Description: Interventions:  - Education, renal diet  - Medication reconciliation  - Education, post discharge follow up  - Education, when to call provider  Outcome: Ongoing    Problem: Fluid Volume, Excess  Goal: Maintain appropriate body weight  Description: Instructions:  - Fluid restriction  - Education, fluid restriction  - Intake and output measurement  - Daily weight  Outcome: Ongoing    Problem: Electrolyte Imbalance, Risk for (Renal Faliure)  Goal: Maintain a normal sinus heart rhythm with regular rate  Description: Interventions:  - Vitals/O2 sat/telemetry monitoring  Outcome: Ongoing  Goal: Maintain normal serum electrolyte levels  Description: Interventions:  - Monitor laboratory data as ordered and report deviations to provider  Outcome: Ongoing    Problem: Infection, Risk for Central Venous Catheter Associated Bloodstream Infection  Goal: Absence of central venous catheter-associated bloodstream infection  Description: Interventions:  - Optimal catheter site selection, with avoidance of the femoral vein for central venous access in adult patients  - Maximal barrier precautions upon insertion  - Hand hygiene  - Chlorhexidine skin antisepsis  - Central line needs assessment  Outcome: Ongoing    Problem: Coping, Ineffective  Goal: Effective coping  Description: Interventions:  - Ineffective coping signs and symptoms assessment  - Consult to social services  - Cognitive emotional support  - Provide continuum of care to patient and family  - Education, healthy daily routines  - Education, disease process  Outcome: Ongoing    Problem: Skin Integrity, Impaired, Risk for  Goal: Absence of pressure injury  Description: Interventions:  - Skin assessment  - Patient repositioning every 2 hours if decreased mobility  Outcome: Ongoing    Problem: Infection, Risk for  Goal: Patient  will be free from infection  Description: Interventions:  1. Monitor for signs and symptoms of infection: fever, increased WBC, burning with urination or chills  2. Monitor insertion site for redness, tenderness, or drainage  3. Hand hygiene per CDC guidelines  4. Discontinuation of invasive devices when not needed  5. Place patient in appropriate isolation  Outcome: Ongoing    Problem: Pain, Acute  Goal: Communication of presence of pain  Description: Interventions:  - Nonpharmacologic pain management  - Medication administration  - Education, pain scale  Outcome: Ongoing    Problem: Deep Venous Thrombosis, Risk of  Goal: Absence of deep venous thrombosis  Description: Interventions:  - Deep venous thrombosis risk assessment  - Ensure patient is receiving antithrombotic medication for VTE prophylaxis  - Activity promotion  - Graduated anti-embolic stocking management  - Intermittent pneumatic compression management.  Outcome: Ongoing    Problem: Falls, Moderate Risk For  Goal: Absence of falls  Description: Interventions:  - Non-skid footwear with ambulation  - Exercise program (if applicable) for strengthening  - Address the P's during each pt encounter (rounding); offer bathroom more frequently for patient on diuretics, bowel prep, etc.  - Siderails up X 2 (Upper left and upper right)  - Bed low position and wheels locked  - Chair/wheelchair locked  - Minimize line tethering  - Walkway free of clutter  - Nightlight on evening/night hours  - Keep door to pt room open (unless contraindication, isolation, etc.)  - Call light, phone, table, and/or other pt necessities in reach (pt MUST demonstrate ability to properly use call light)  - Appropriate fall bracelet on at all times  -  Provide pt/family with fall education on admission and transfers  - Reorient pt to environment as appropriate  - Remind pt/family to call for assistance with each encounter    Outcome: Ongoing    Electronically signed by Charleen Rake, LPN  at 98/85/7974  6:45 PM EST

## 2023-04-09 NOTE — Progress Notes (Signed)
 Formatting of this note might be different from the original.  Morning meds given. Patient assessed and VS reviewed. Patient states no further needs at this time. Care ongoing.     Electronically signed by Natalia Leatherwood, LPN at 51/88/4166 11:20 AM EST

## 2023-04-09 NOTE — Progress Notes (Signed)
 Formatting of this note is different from the original.  Images from the original note were not included.      [facility]  Follow-Up Note    LOS: 2    CC: Follow-up on ESRD    Clinical history: Patient initially admitted 04/06/2023 for sepsis and hypotension from UTI.     Interval history: Over the last 24 hours, symptoms/labs progression: improved. Associated symptoms: none. Patient reports he is feeling well today. He tolerated dialysis well yesterday.  1.5 L removed on dialysis yesterday. Hemodynamically stable.       Review of Systems:   Constitutional: No fever/chills.   Respiratory: No shortness of breath. No coughs/colds.   Cardiac: No chest pain. No orthopnea. No palpitations. No diaphoresis.    Physical Exam:  Vitals:    04/08/23 1953 04/08/23 2354 04/09/23 0556 04/09/23 0601   BP: (!) 151/93 119/58 129/87    BP Location: Right Upper Extremity Right Upper Extremity Right Upper Extremity    Patient Position: Semi Fowlers Prone Semi Fowlers    Pulse: 79 76 71    Resp:       Temp: 97.8 F (36.6 C) 98.4 F (36.9 C) 97.7 F (36.5 C)    TempSrc: Oral Oral Oral    SpO2: 100% 98% 99%    Weight:    87.3 kg (192 lb 6.4 oz)   Height:         Last 5 Weights    04/07/23 0500 04/08/23 0507 04/08/23 1215 04/08/23 1535   Weight: 83.7 kg (184 lb 8.4 oz) 86.8 kg (191 lb 6.4 oz) 86.8 kg (191 lb 5.8 oz) 85.3 kg (188 lb 0.8 oz)    04/09/23 0601   Weight: 87.3 kg (192 lb 6.4 oz)     Intake/Output Summary (Last 24 hours) at 04/09/2023 0823  Last data filed at 04/09/2023 0601  Gross per 24 hour   Intake 1610 ml   Output 2000 ml   Net -390 ml     GENERAL: Not in acute distress. Appears comfortable.  LUNGS: Generally clear to auscultation bilaterally. No crackles.   MUSCULOSKELETAL: No dependent edema.    NEUROLOGIC: Awake. No myoclonus.  ACCESS: LCW tunnel cath/ Left forearm AVF    Labs:  Recent Labs     04/09/23  1225 04/09/23  0657 04/09/23  0039 04/08/23  1829 04/08/23  1158 04/08/23  0004 04/07/23  1835  04/07/23  1152 04/07/23  0709 04/07/23  0034   WBC  --   --  11.8*  --   --  8.1  --   --   --  8.3   RBC  --   --  2.82*  --   --  3.41*  --   --   --  3.02*   HGB 8.1* 8.9* 8.8* 10.0* 8.3* 10.7* 10.0* 8.7* 9.9* 9.4*   HCT 24.8* 27.4* 26.5* 30.9* 25.6* 32.5* 30.2* 26.5* 29.9* 28.3*   MCV  --   --  93.8  --   --  95.3  --   --   --  93.8   PLATELETCNT  --   --  289  --   --  304  --   --   --  262   SODIUM  --   --  136  --   --  135  --   --   --  137   OSMOLALITY  --   --  285  --   --  285  --   --   --  282   POTASSIUM  --   --  5.0  --   --  5.1*  --   --   --  4.3   MAGNESIUM  --   --  1.9  --   --  2.0  --   --   --  2.0   PHOSPHORUS  --   --  6.6*  --   --  4.7*  --   --   --  5.7*   CHLORIDE  --   --  99*  --   --  100*  --   --   --  100*   CO2  --   --  23  --   --  21  --   --   --  26   ANIONGAP  --   --  14  --   --  14  --   --   --  11   GLUCOSE  --   --  112*  --   --  134*  --   --   --  91   BUN  --   --  46*  --   --  50*  --   --   --  36*   CREATININE  --   --  9.7*  --   --  13.4*  --   --   --  11.2*   CALCIUM  --   --  8.0*  --   --  9.1  --   --   --  8.0*   TBILIRUBIN  --   --  0.3  --   --  0.5  --   --   --  0.4   ALP  --   --  66  --   --  70  --   --   --  66   AST  --   --  12  --   --  17  --   --   --  16   ALTSGPT  --   --  6*  --   --  5*  --   --   --  5*   ALBUMIN  --   --  3.5  --   --  3.9  --   --   --  3.3     Assessment / Plan:   ESRD (stable). (Transplant rejection) Patient gets hemodialysis at [facility] on MWF under management of Dr. Lesta.  Plan for next hemodialysis treatment tomorrow  per normal schedule. Avoid nephrotoxin exposure, including but not limited to NSAIDs, IV dye, MRI gadolinium and Fleets.    Hypotension (stable). Continue on midodrine therapy.  Keep SBP > 90-100 mm hg if possible. Continue to monitor.   Anemia in CKD complicated by acute blood loss(worse). Hgb goal of 10-11 g/dL with advanced kidney disease. Current Hgb 8.9 g/dL, below goal. GI  following considering pill cam. No indications for transfusion. Due to national blood shortage transfusion only if Hgb < 8 g/dL.    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 Continue steroids. Patient awaiting transfer to tertiary care center for possible removal of kidney graft.   Hyperkalemia (improving). Likely d/t impaired potassium clearance with advanced renal failure. Potassium is currently 5.0 mmol/L. Improved with dialysis yesterday. Continue to monitor trends.   Hyperphosphatemia (worse). Likely due to impaired renal phosphorus clearance  in the setting of renal failure. Current phosphorous is 6.6 mg/dL. Continue current dose of binders with meals. Continue to monitor trends.   GI bleed. GI following with possibility of pill cam. Further evaluation and management per GI.   UTI. Further evaluation and management per primary team.   Medications. Please dose all medications for CrCl< 10 ml/min.     Patient and/or family was updated on diagnoses, procedures, tests, results, and plan of care.     Less than half of the total split-share visit time needed to collect history, review the records and examine the patient was spent by me individually (10 minutes).     Voice transcription technology was used for dictation of this note and sound-alike words might be erroneously placed despite reviewing the note for accuracy. Errors in dictation may reflect use of voice recognition software and not all errors in transcription may have been detected prior to signing.    Harlene Dakins   [facility]  04/09/2023    I individually spent 11 minutes, more than half of the total time needed to generate this split-share documentation. I performed an independent review of the records before the face-to-face encounter, and I independently collected history and examined the patient. The medical decision making was generated largely by me based on my own review of the findings,  face-to-face time with patient and/or family, and using my nephrology expertise in reviewing the clinical and laboratory facts to generate a care plan.     I independently evaluated the patient, performed a physical examination, and review of the history, labs and medications. I reviewed the findings of the nurse practitioner, and my pertinent findings are as follows:    On my exam: negative for distress. No edema in the lower extremities.   Assessment/Plan:   ESRD on hemodialysis now with rejection of the kidney graft.  Discussed with the transplant center in [place].  We will give the third dose of Solu-Medrol  today, and awaiting transfer for possible removal of the graft  The dialysis catheter can be removed since he has been receiving dialysis via the AV fistula    Janee Mcalpine, M.D.     Voice transcription technology was used for dictation of this note and sound-alike words might be erroneously placed despite reviewing the note for accuracy.  Errors in dictation may reflect use of voice recognition software and not all errors in transcription may have been detected prior to signing.     Electronically signed by Mcalpine Janee, MD at 04/09/2023  3:56 PM EST

## 2023-04-09 NOTE — Consults (Signed)
 Associated Order(s): IP CONSULT TO VASCULAR SURGERY  Formatting of this note might be different from the original.  Images from the original note were not included.        Patient is a 38 yr old male with history of ESRD and renal transplant. S/p LUE AVF creation on 12/2022 in [place] . NO admitted for UTI and underlying transplant rejection. Apparently LUE AVF is working well. Vascular surgery has been requested tunneled catheter removal.  Assessment and plan discussed with Dr. Venetia.   Procedure added to the Vascular lab for tomorrow.     Joel Klepach, APRN  04/09/2023]  4:11 PM    Separate H&P taken.     Cheryn Venetia, MD  Vascular surgery  Electronically signed by Venetia Cheryn, MD at 04/10/2023  1:18 PM EST

## 2023-04-09 NOTE — Consults (Signed)
 Associated Order(s): IP CONSULT TO IV THERAPY  Formatting of this note might be different from the original.  20G PIV was placed in the Rt basilic vein under ultrasound guidance. Blood return noted upon insertion and flushed with 10 mL normal saline. PIV insertion site was covered with tegaderm and tape was used to secure the line. Green curos cap was applied. Primary  nurse was notified of PIV placement.    Electronically signed by Landy Messenger, RN at 04/09/2023  1:50 PM EST

## 2023-04-09 NOTE — Progress Notes (Signed)
 Formatting of this note might be different from the original.  Pharmacy note: Vancomycin  dosing  Goal level = </= 20 mcg/ml  Random level = 15.0 mcg/ml        A/P:  Level drawn appropriately.  Will plan to order Vancomycin  1.25 grams IV x 1 dose.  Will continue to monitor and make dosing adjustments as needed.   Thank you,  Olam Burnadette Pandy, [facility]  Electronically signed by Pandy Olam Burnadette, RPH at 04/09/2023  3:18 AM EST

## 2023-04-09 NOTE — Unmapped External Note (Signed)
 Formatting of this note might be different from the original.  Roberto Rice    Test Name: Creatinine  Results:  9.7  04/09/2023  Time: 0256  Received from: lab  R/V by: Alan BRAVO RN    (Required: Attempt notification within 30 minutes or document reason for no notification)    Physician: Dr.Shahzad  04/09/2023  Time: 0259  Notified: No - Due to Expected Result: trending down       Electronically signed by Alto Alan, RN at 04/09/2023  2:59 AM EST

## 2023-04-10 NOTE — Unmapped External Note (Signed)
 Formatting of this note might be different from the original.    Problem: Discharge Planning  Goal: Knowledge of discharge instructions  Description: Interventions:  - Education, renal diet  - Medication reconciliation  - Education, post discharge follow up  - Education, when to call provider  Outcome: Ongoing    Problem: Fluid Volume, Excess  Goal: Maintain appropriate body weight  Description: Instructions:  - Fluid restriction  - Education, fluid restriction  - Intake and output measurement  - Daily weight  Outcome: Ongoing    Problem: Electrolyte Imbalance, Risk for (Renal Faliure)  Goal: Maintain a normal sinus heart rhythm with regular rate  Description: Interventions:  - Vitals/O2 sat/telemetry monitoring  Outcome: Ongoing  Goal: Maintain normal serum electrolyte levels  Description: Interventions:  - Monitor laboratory data as ordered and report deviations to provider  Outcome: Ongoing    Problem: Infection, Risk for Central Venous Catheter Associated Bloodstream Infection  Goal: Absence of central venous catheter-associated bloodstream infection  Description: Interventions:  - Optimal catheter site selection, with avoidance of the femoral vein for central venous access in adult patients  - Maximal barrier precautions upon insertion  - Hand hygiene  - Chlorhexidine skin antisepsis  - Central line needs assessment  Outcome: Ongoing    Problem: Coping, Ineffective  Goal: Effective coping  Description: Interventions:  - Ineffective coping signs and symptoms assessment  - Consult to social services  - Cognitive emotional support  - Provide continuum of care to patient and family  - Education, healthy daily routines  - Education, disease process  Outcome: Ongoing    Problem: Skin Integrity, Impaired, Risk for  Goal: Absence of pressure injury  Description: Interventions:  - Skin assessment  - Patient repositioning every 2 hours if decreased mobility  Outcome: Ongoing    Problem: Infection, Risk for  Goal: Patient  will be free from infection  Description: Interventions:  1. Monitor for signs and symptoms of infection: fever, increased WBC, burning with urination or chills  2. Monitor insertion site for redness, tenderness, or drainage  3. Hand hygiene per CDC guidelines  4. Discontinuation of invasive devices when not needed  5. Place patient in appropriate isolation  Outcome: Ongoing    Problem: Pain, Acute  Goal: Communication of presence of pain  Description: Interventions:  - Nonpharmacologic pain management  - Medication administration  - Education, pain scale  Outcome: Ongoing    Problem: Deep Venous Thrombosis, Risk of  Goal: Absence of deep venous thrombosis  Description: Interventions:  - Deep venous thrombosis risk assessment  - Ensure patient is receiving antithrombotic medication for VTE prophylaxis  - Activity promotion  - Graduated anti-embolic stocking management  - Intermittent pneumatic compression management.  Outcome: Ongoing    Problem: Falls, Moderate Risk For  Goal: Absence of falls  Description: Interventions:  - Non-skid footwear with ambulation  - Exercise program (if applicable) for strengthening  - Address the P's during each pt encounter (rounding); offer bathroom more frequently for patient on diuretics, bowel prep, etc.  - Siderails up X 2 (Upper left and upper right)  - Bed low position and wheels locked  - Chair/wheelchair locked  - Minimize line tethering  - Walkway free of clutter  - Nightlight on evening/night hours  - Keep door to pt room open (unless contraindication, isolation, etc.)  - Call light, phone, table, and/or other pt necessities in reach (pt MUST demonstrate ability to properly use call light)  - Appropriate fall bracelet on at all times  -  Provide pt/family with fall education on admission and transfers  - Reorient pt to environment as appropriate  - Remind pt/family to call for assistance with each encounter    Outcome: Ongoing    Electronically signed by Charleen Rake, LPN  at 98/84/7974  4:05 PM EST

## 2023-04-10 NOTE — Unmapped (Signed)
 Formatting of this note might be different from the original.  Handoff report and room rounding completed with [facility] LPN  Electronically signed by Alphia Kava, LPN at 16/12/9602  1:12 AM EST

## 2023-04-10 NOTE — Unmapped External Note (Signed)
 Formatting of this note might be different from the original.  Roberto Rice    Test Name: CREATININE  Results:  12.1  04/10/2023  Time: 0103  Received from: LAB  R/V by: ALAN BRAVO RN     (Required: Attempt notification within 30 minutes or document reason for no notification)    Physician: DR.GUO  04/10/2023  Time: 0130  Notified: No - Due to Other: trending . Dialysis scheduled for today       Electronically signed by Alto Alan, RN at 04/10/2023  2:11 AM EST

## 2023-04-10 NOTE — Progress Notes (Signed)
 Formatting of this note might be different from the original.  Patient off unit for dialysis   Electronically signed by Natalia Leatherwood, LPN at 16/12/9602  7:59 AM EST

## 2023-04-10 NOTE — Progress Notes (Signed)
 Formatting of this note is different from the original.  HOSPITAL MEDICINE PROGRESS NOTE    Patient:  Roberto Rice  MRN:  [id number]  Room: 3G380/3G380J  Admit Date: 04/06/2023  Hospital Day:   LOS: 3 days   Current Date: 04/10/2023    Chief Complaint - follow-up for Sepsis secondary to UTI (CMS/HCC)     Hospital Course to Date: No notes on file    Subjective/Interval History: he is on room air.     Seen and examined between 8AM and noon.     Relevant ROS: Review of Systems   Respiratory:  Negative for shortness of breath.    Cardiovascular:  Negative for chest pain.       Physical Exam for 04/10/2023  Blood pressure (!) 156/99, pulse 70, temperature 98.5 F (36.9 C), temperature source Oral, resp. rate 18, height 5' 11 (180.3 cm), weight 87.3 kg (192 lb 6.4 oz), SpO2 100%.  Physical Exam  Cardiovascular:      Rate and Rhythm: Normal rate and regular rhythm.   Pulmonary:      Breath sounds: Normal breath sounds.   Abdominal:      General: Bowel sounds are normal.   Musculoskeletal:      Cervical back: Neck supple.   Neurological:      Mental Status: He is alert.     Intake/Output          04/09/23 0700 - 04/10/23 0659 04/10/23 0700 - 04/11/23 0659     [id number] 1900-0659 Total 9299-8140 1900-0659 Total        Intake    P.O.  480  600 1080  --  -- --    Other  --  -- --  500  -- 500    Total Intake [phone] 500 -- 500      Output    Urine  --  -- --  --  -- --    Urine Occurrence 2 x 0 x 2 x -- -- --    Other  --  -- --  3210  -- 3210    HD Total Volume Removed -- -- -- 3210 -- 3210    Stool  --  -- --  --  -- --    Stool Occurrence 0 x 0 x 0 x -- -- --    Total Output -- -- -- 3210 -- 3210         Last 5 Weights    04/07/23 0500 04/08/23 0507 04/08/23 1215 04/08/23 1535   Weight: 83.7 kg (184 lb 8.4 oz) 86.8 kg (191 lb 6.4 oz) 86.8 kg (191 lb 5.8 oz) 85.3 kg (188 lb 0.8 oz)    04/09/23 0601   Weight: 87.3 kg (192 lb 6.4 oz)     Labs and Imaging:  Recent Labs     04/10/23  1156 04/10/23  0651 04/10/23  0035  04/09/23  1855 04/09/23  1225 04/09/23  0657 04/09/23  0039 04/08/23  1829 04/08/23  1158 04/08/23  0004 04/07/23  1835   WBC  --   --  10.8  --   --   --  11.8*  --   --  8.1  --    RBC  --   --  2.88*  --   --   --  2.82*  --   --  3.41*  --    HGB 9.2* 8.5* 9.0* 9.1* 8.1* 8.9* 8.8* 10.0* 8.3* 10.7* 10.0*   HCT 28.3*  26.1* 27.2* 27.8* 24.8* 27.4* 26.5* 30.9* 25.6* 32.5* 30.2*   MCV  --   --  94.4  --   --   --  93.8  --   --  95.3  --    PLATELETCNT  --   --  303  --   --   --  289  --   --  304  --    SODIUM  --   --  136  --   --   --  136  --   --  135  --    POTASSIUM  --   --  5.4*  --   --   --  5.0  --   --  5.1*  --    MAGNESIUM  --   --  1.7  --   --   --  1.9  --   --  2.0  --    PHOSPHORUS  --   --  6.0*  --   --   --  6.6*  --   --  4.7*  --    CHLORIDE  --   --  100*  --   --   --  99*  --   --  100*  --    CO2  --   --  19*  --   --   --  23  --   --  21  --    GLUCOSE  --   --  143*  --   --   --  112*  --   --  134*  --    BUN  --   --  72*  --   --   --  46*  --   --  50*  --    CREATININE  --   --  12.1*  --   --   --  9.7*  --   --  13.4*  --    CALCIUM  --   --  7.3*  --   --   --  8.0*  --   --  9.1  --    TBILIRUBIN  --   --  0.4  --   --   --  0.3  --   --  0.5  --    ALP  --   --  56  --   --   --  66  --   --  70  --    AST  --   --  14  --   --   --  12  --   --  17  --    ALBUMIN  --   --  3.3  --   --   --  3.5  --   --  3.9  --      PROCALCITONIN, QN, S   Date Value Ref Range Status   04/09/2023 0.95 ng/mL Final     Comment:     .          <0.05 ng/mL  Healthy individuals.          <0.50 ng/mL  Systemic infection (sepsis) is not likely.    0.50 - 2.00 ng/mL  Systemic infection (sepsis) is possible, but other                           conditions are known to induce PCT as  well.   2.00 - 10.00 ng/mL  Systemic infection (sepsis) is likely, unless other                           causes are known.        >=10.00 ng/mL  Important systemic inflammatory response, almost                            exclusively due to severe bacterial sepsis or                          septic shock.        Imaging and Procedures  reviewed    Assessment and Plan     Hospital Problems and Relevant comorbid conditions:  Principal Problem:    Sepsis secondary to UTI (CMS/HCC)  Active Problems:    Rectal bleed    Renal transplant, status post    Anemia    Hypertension    ESRD (end stage renal disease) on dialysis (CMS/HCC)    * Sepsis secondary to UTI (CMS/HCC)  C/w empiric abx.  Follow cultures.  Blood cx x 2 NG. Urine cx negative.      ESRD - nephrology following for dialysis.    Renal transplant - renal allografts edematous. IV solumedrol per renal. Nephrology recommending transfer, [facility] on diversion. Patient refuses to go anywhere else.     BRBPR-resolved. GI consulted. Scopes tomorrow. Monitor CBC. Hgb stable.     GI/DVT ppx - PPI.  Heparin  subQ.  Home vs transfer on DC     Diet Clear Liquid;  One Time Message to Dietary  One Time Message to Dietary    Patient and/or family was updated on diagnoses, procedures, tests, results, and plan of care.    I, individually, spent less than half of the total split-share visit time (10 minutes) needed to collect history, review the records and examine the patient.     ---  Assessment and plan generated using problem oriented charting. I have personally reviewed the previous A/P notes and have included what is applicable today.    Medications reviewed. Labs reviewed    Signed,  Harlene Ruff, APRN  04/10/2023    [id number]    Pt seen initially by Advanced Practice Provider, discussed with me, and then I independently evaluated the patient, reviewed labs, imaging, medications and performed a physical exam. A summary is documented below.    Subjective: Pt feeling well today.  No cp or soa.  No n/v/d     Exam/Pertinent findings: nad, cv rrr,lung cta, abd nt, active bowel sounds, neuro axox3    Principal Problem:    Sepsis secondary to UTI (CMS/HCC)  Active Problems:    Rectal bleed    Renal  transplant, status post    Anemia    Hypertension    ESRD (end stage renal disease) on dialysis (CMS/HCC)    Vasc. Gi, renal consulted   Iv steroids per renal   Scopes tomorrow     I individually spent more than half of the total time (25 minutes) needed to generate this split-share documentation. I performed an independent review of the records before the face-to-face encounter and I independently collected history and examined the patient. The medical decision making was generated largely by me based on my own review of the findings, face-to-face time with patient and/or family, collaboration with specialists and  reviewing the clinical, laboratory and imaging facts to generate a care plan.    Donnice Lynwood Barnacle, MD  04/10/2023        Electronically signed by Barnacle Donnice Lynwood, MD at 04/10/2023  8:46 PM EST

## 2023-04-10 NOTE — Progress Notes (Signed)
 Formatting of this note is different from the original.  Progress Note    Patient:Roberto Rice FMW:[id number]   DOB:1985-05-04    Admit Date:04/06/2023 LOS: LOS: 4 days      Subjective:   Chief Complaint: f/u sepsis and hypotension from UTI, ESRD (allograft rejection) rectal bleed , BLA  Interval History: left tunnel catheter removed  drowsy,  no further bleed  discussed EGD colon in am    Objective:   Last Recorded Vital Signs: Temp: 98.6 F (37 C)  Heart Rate (Monitor): 64  Pulse: 72  BP: 132/72  Respirations: 17  SpO2: 98 %  O2 Flow Rate (l/min): 0 l/min    Pertinent Labs:   Component      Latest Ref Rng 04/09/2023  6:55 PM 04/10/2023  12:35 AM 04/10/2023  6:51 AM 04/10/2023  11:56 AM   WBC      4.5 - 11.0 10*3/uL  10.8      RBC      4.50 - 5.90 10*6/uL  2.88 (L)      HGB      13.5 - 17.5 g/dL 9.1 (L)  9.0 (L)  8.5 (L)  9.2 (L)    HCT      37.0 - 53.0 % 27.8 (L)  27.2 (L)  26.1 (L)  28.3 (L)    MCV      80.0 - 100.0 fL  94.4      MCHC      32.0 - 36.0 g/dL  67.0      MCH      73.9 - 34.0 pg  31.1      RDW      10.7 - 18.7 %  16.8      MPV      6.5 - 10.0 fL  7.7      Platelet Cnt      150 - 450 10*3/uL  303      Differential Type  Auto      Neutrophils      35.0 - 66.0 %  92.1 (H)      Lymphocytes      24.0 - 44.0 %  6.0 (L)      Monocytes      2.1 - 13.3 %  1.6 (L)      Eosinophils      0.3 - 5.0 %  0.1 (L)      Basophils      0.0 - 1.0 %  0.2      Neutrophils Abs      1.5 - 8.5 10*3/uL  9.9 (H)      Lymphocytes Abs      1.1 - 5.0 10*3/uL  0.7 (L)      Monocytes Abs      0.0 - 1.4 10*3/uL  0.2      Eosinophils Abs      0.0 - 0.5 10*3/uL  0.0      Basophils Abs      0.0 - 0.1 10*3/uL  0.0      SODIUM      135 - 145 mmol/L  136      POTASSIUM      3.6 - 5.0 mmol/L  5.4 (H)      CHLORIDE      101 - 111 mmol/L  100 (L)      CO2      21 - 31 mmol/L  19 (L)      ANION GAP  17  GLUCOSE      70 - 110 mg/dL  856 (H)      Creatinine      0.6 - 1.2 mg/dL  87.8 (HH)      BUN      2 - 32 mg/dL  72 (H)      CALCIUM      8.5  - 10.5 mg/dL  7.3 (L)      PROTEIN TOTAL      6.1 - 7.8 g/dL  7.1      Albumin      3.2 - 5.0 g/dL  3.3      T BILIRUBIN      0.2 - 1.0 mg/dL  0.4      ALP      42 - 121 [iU]/L  56      AST      10 - 42 [iU]/L  14      ALT      10 - 60 [iU]/L  7 (L)      OSMOLALITY      266 - 309   296      A/G Ratio  0.9      B/C      10 - 20   6 (L)      ESTIMATED GFR      mL/min  5      IRON      50 - 160 ug/dL   66     IRON BINDING CAPACITY, TOTAL      250 - 400 ug/dL   789 (L)     UIBC      150 - 375 ug/dL   855 (L)     TRANSFERRIN      180.00 - 329.00 mg/dL   849.99 (L)     % SATURATION      15 - 55 %   31       Legend:  (L) Low  (H) High  (HH) High Panic    Imaging:Reviewed    Physical Exam:  General Appearance: alert, no distress, appears stated age  Lungs:  clear to auscultation bilaterally  Heart:  regular rate and rhythm, S1, S2 normal  Extremities:  left arm AV fistula  Abdomen: soft, non-tender.  Bowel sounds normal.  No masses,  no organomegaly    Principal Problem:    Sepsis secondary to UTI (CMS/HCC)  Active Problems:    Rectal bleed    Renal transplant, status post    Anemia    Hypertension    ESRD (end stage renal disease) on dialysis (CMS/HCC)    Assessment:    1.  Rectal bleed stable probably avms  diverticulosis    2. Blood loss anemia/drop in Hb    3. ERSD on HD  transplant rejected   4. Sepsis/UTI    Plan:    1.  Colonoscopy     2. EGD and pill cam     3. PRBC when Hb < 8    Signed:  Rome Florence, M.D., MD  04/11/2023  Electronically signed by Florence Rome, MD at 04/11/2023  4:54 AM EST

## 2023-04-10 NOTE — Progress Notes (Signed)
 Formatting of this note is different from the original.  Images from the original note were not included.      [facility] Nephrology & Hypertension  Follow-Up Note    LOS: 3    CC: Follow-up on ESRD    Clinical history: Patient initially admitted 04/06/2023 for sepsis and hypotension from UTI.     Interval history: Over the last 24 hours, symptoms/labs progression: improved. Associated symptoms: none. Patient reports he is feeling some better. Plans for tunnel cath removal per vascular today. Hemodynamically stable.       Review of Systems:   Constitutional: No fever/chills.   Respiratory: No shortness of breath. No coughs/colds.   Cardiac: No chest pain. No orthopnea. No palpitations. No diaphoresis.    Physical Exam:  Vitals:    04/10/23 0515 04/10/23 0742 04/10/23 0752 04/10/23 0807   BP: 141/78 141/76 146/75 159/77   BP Location: Right Upper Extremity      Patient Position: Lying      Pulse: 79      Resp:  17 (!) 8 (!) 11   Temp: 98 F (36.7 C) 98 F (36.7 C)     TempSrc: Oral      SpO2: 100%      Weight:       Height:         Last 5 Weights    04/07/23 0500 04/08/23 0507 04/08/23 1215 04/08/23 1535   Weight: 83.7 kg (184 lb 8.4 oz) 86.8 kg (191 lb 6.4 oz) 86.8 kg (191 lb 5.8 oz) 85.3 kg (188 lb 0.8 oz)    04/09/23 0601   Weight: 87.3 kg (192 lb 6.4 oz)     Intake/Output Summary (Last 24 hours) at 04/10/2023 0934  Last data filed at 04/10/2023 0600  Gross per 24 hour   Intake 1080 ml   Output --   Net 1080 ml     GENERAL: Not in acute distress. Appears comfortable.  LUNGS: Generally clear to auscultation bilaterally. No crackles.   MUSCULOSKELETAL: No dependent edema.    NEUROLOGIC: Awake. No myoclonus.  ACCESS: LUE AVF, LCW tunnel cath    Labs:  Recent Labs     04/10/23  1156 04/10/23  0651 04/10/23  0035 04/09/23  1855 04/09/23  1225 04/09/23  0657 04/09/23  0039 04/08/23  1829 04/08/23  1158 04/08/23  0004 04/07/23  1835   WBC  --   --  10.8  --   --   --  11.8*  --   --  8.1  --    RBC  --   --  2.88*  --   --   --   2.82*  --   --  3.41*  --    HGB 9.2* 8.5* 9.0* 9.1* 8.1* 8.9* 8.8* 10.0* 8.3* 10.7* 10.0*   HCT 28.3* 26.1* 27.2* 27.8* 24.8* 27.4* 26.5* 30.9* 25.6* 32.5* 30.2*   MCV  --   --  94.4  --   --   --  93.8  --   --  95.3  --    PLATELETCNT  --   --  303  --   --   --  289  --   --  304  --    SODIUM  --   --  136  --   --   --  136  --   --  135  --    OSMOLALITY  --   --  296  --   --   --  285  --   --  285  --    POTASSIUM  --   --  5.4*  --   --   --  5.0  --   --  5.1*  --    MAGNESIUM  --   --  1.7  --   --   --  1.9  --   --  2.0  --    PHOSPHORUS  --   --  6.0*  --   --   --  6.6*  --   --  4.7*  --    CHLORIDE  --   --  100*  --   --   --  99*  --   --  100*  --    CO2  --   --  19*  --   --   --  23  --   --  21  --    ANIONGAP  --   --  17  --   --   --  14  --   --  14  --    GLUCOSE  --   --  143*  --   --   --  112*  --   --  134*  --    BUN  --   --  72*  --   --   --  46*  --   --  50*  --    CREATININE  --   --  12.1*  --   --   --  9.7*  --   --  13.4*  --    CALCIUM  --   --  7.3*  --   --   --  8.0*  --   --  9.1  --    TBILIRUBIN  --   --  0.4  --   --   --  0.3  --   --  0.5  --    ALP  --   --  56  --   --   --  66  --   --  70  --    AST  --   --  14  --   --   --  12  --   --  17  --    ALTSGPT  --   --  7*  --   --   --  6*  --   --  5*  --    ALBUMIN  --   --  3.3  --   --   --  3.5  --   --  3.9  --      Assessment / Plan:   ESRD (stable). Patient gets hemodialysis at [facility] on MWF under management of Dr. Lesta.  Plan for next hemodialysis treatment today per normal schedule. Running through LUE AVF without complicated. Plan for tunnel cath removal today. Avoid nephrotoxin exposure, including but not limited to NSAIDs, IV dye, MRI gadolinium and Fleets.    Hypotension (improving). Continue on midodrine therapy.  Blood pressure slightly elevated today. Keep SBP > 90-100 mm hg if possible. Continue to monitor.   Anemia in CKD complicated by acute blood loss (worse). Hgb goal of  10-11 g/dL with advanced kidney disease. Current Hgb 8.5 g/dL. GI with plans for colonoscopy and pill cam on Thursday. No indications for transfusion. Due to national blood shortage transfusion only if Hgb < 8 g/dL.  Transplant rejection.  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 Continue steroids. Patient awaiting transfer to tertiary care center for possible removal of kidney graft.   Hyperkalemia (worse). Likely d/t impaired potassium clearance with advanced renal failure. Potassium is currently 5.4 mmol/L. Should improve with dialysis this am, adjust bath. Continue to monitor trends.   Hyperphosphatemia (new). Likely due to impaired renal phosphorus clearance in the setting of renal failure. Current phosphorous is 6.0 mg/dL. Continue current dose of binders. Should improve with HD today. Continue to monitor trends.  Hypocalcemia (new). Likely due to poor oral intake. Corrected Calcium currently is 7.9 mg/dL. Continue to monitor daily trends.  GI bleeding. GI following with plans for colonoscopy and pill cam on Thursday. Further evaluation and management per GI team.   UTI. Further evaluation and management per primary team.   Medications. Please dose all medications for CrCl< 10 ml/min.     Patient and/or family was updated on diagnoses, procedures, tests, results, and plan of care.     Less than half of the total split-share visit time needed to collect history, review the records and examine the patient was spent by me individually (10 minutes).     Voice transcription technology was used for dictation of this note and sound-alike words might be erroneously placed despite reviewing the note for accuracy. Errors in dictation may reflect use of voice recognition software and not all errors in transcription may have been detected prior to signing.    Harlene Dakins   [facility] Nephrology & Hypertension  04/10/2023    I individually spent 11 minutes, more than half of the total time needed to generate this  split-share documentation. I performed an independent review of the records before the face-to-face encounter, and I independently collected history and examined the patient. The medical decision making was generated largely by me based on my own review of the findings, face-to-face time with patient and/or family, and using my nephrology expertise in reviewing the clinical and laboratory facts to generate a care plan.     I independently evaluated the patient, performed a physical examination, and review of the history, labs and medications. I reviewed the findings of the nurse practitioner, and my pertinent findings are as follows:    On my exam: negative for distress. No edema in the lower extremities.   Assessment/Plan:   ESRD on hemodialysis MWF.  Seen on dialysis today and tolerating the treatment well.  Kidney graft rejection.  Has received 3 doses of Solu-Medrol .  We will start on prednisone  p.o. as of tomorrow.  Awaiting transfer to transplant center for possible kidney graft extraction    Janee Mcalpine, M.D.     Voice transcription technology was used for dictation of this note and sound-alike words might be erroneously placed despite reviewing the note for accuracy.  Errors in dictation may reflect use of voice recognition software and not all errors in transcription may have been detected prior to signing.     Electronically signed by Mcalpine Janee, MD at 04/10/2023  3:40 PM EST

## 2023-04-10 NOTE — Unmapped External Note (Signed)
 Formatting of this note might be different from the original.    Problem: Discharge Planning  Goal: Knowledge of discharge instructions  Description: Interventions:  - Education, renal diet  - Medication reconciliation  - Education, post discharge follow up  - Education, when to call provider  Outcome: Ongoing    Problem: Fluid Volume, Excess  Goal: Maintain appropriate body weight  Description: Instructions:  - Fluid restriction  - Education, fluid restriction  - Intake and output measurement  - Daily weight  Outcome: Ongoing    Problem: Electrolyte Imbalance, Risk for (Renal Faliure)  Goal: Maintain a normal sinus heart rhythm with regular rate  Description: Interventions:  - Vitals/O2 sat/telemetry monitoring  Outcome: Ongoing  Goal: Maintain normal serum electrolyte levels  Description: Interventions:  - Monitor laboratory data as ordered and report deviations to provider  Outcome: Ongoing    Problem: Infection, Risk for Central Venous Catheter Associated Bloodstream Infection  Goal: Absence of central venous catheter-associated bloodstream infection  Description: Interventions:  - Optimal catheter site selection, with avoidance of the femoral vein for central venous access in adult patients  - Maximal barrier precautions upon insertion  - Hand hygiene  - Chlorhexidine skin antisepsis  - Central line needs assessment  Outcome: Ongoing    Problem: Coping, Ineffective  Goal: Effective coping  Description: Interventions:  - Ineffective coping signs and symptoms assessment  - Consult to social services  - Cognitive emotional support  - Provide continuum of care to patient and family  - Education, healthy daily routines  - Education, disease process  Outcome: Ongoing    Problem: Skin Integrity, Impaired, Risk for  Goal: Absence of pressure injury  Description: Interventions:  - Skin assessment  - Patient repositioning every 2 hours if decreased mobility  Outcome: Ongoing    Problem: Infection, Risk for  Goal: Patient  will be free from infection  Description: Interventions:  1. Monitor for signs and symptoms of infection: fever, increased WBC, burning with urination or chills  2. Monitor insertion site for redness, tenderness, or drainage  3. Hand hygiene per CDC guidelines  4. Discontinuation of invasive devices when not needed  5. Place patient in appropriate isolation  Outcome: Ongoing    Problem: Pain, Acute  Goal: Communication of presence of pain  Description: Interventions:  - Nonpharmacologic pain management  - Medication administration  - Education, pain scale  Outcome: Ongoing    Problem: Deep Venous Thrombosis, Risk of  Goal: Absence of deep venous thrombosis  Description: Interventions:  - Deep venous thrombosis risk assessment  - Ensure patient is receiving antithrombotic medication for VTE prophylaxis  - Activity promotion  - Graduated anti-embolic stocking management  - Intermittent pneumatic compression management.  Outcome: Ongoing    Problem: Falls, Moderate Risk For  Goal: Absence of falls  Description: Interventions:  - Non-skid footwear with ambulation  - Exercise program (if applicable) for strengthening  - Address the P's during each pt encounter (rounding); offer bathroom more frequently for patient on diuretics, bowel prep, etc.  - Siderails up X 2 (Upper left and upper right)  - Bed low position and wheels locked  - Chair/wheelchair locked  - Minimize line tethering  - Walkway free of clutter  - Nightlight on evening/night hours  - Keep door to pt room open (unless contraindication, isolation, etc.)  - Call light, phone, table, and/or other pt necessities in reach (pt MUST demonstrate ability to properly use call light)  - Appropriate fall bracelet on at all times  -  Provide pt/family with fall education on admission and transfers  - Reorient pt to environment as appropriate  - Remind pt/family to call for assistance with each encounter    Outcome: Ongoing    Electronically signed by Erling Canning, LPN  at 98/84/7974  2:39 AM EST

## 2023-04-10 NOTE — Unmapped External Note (Signed)
 Formatting of this note is different from the original.  Nursing End of Shift Summary    Pertinent changes to patient conditions and/or care this shift: Right arm pain/swelling      Vital Signs Weight: 87.3 kg (192 lb 6.4 oz) (04/09/23 0601)    Temp: 98 F (36.7 C)  Temp Source: Oral    Heart Rate:89    BP: 141/78    Respirations: 18   Oxygen Saturation SpO2: 100 %  O2 Delivery: Room air  O2 Device: None (Room air)  O2 Flow Rate (l/min): 0 l/min    Incentive Spirometry Incentive Spirometry: No      Diet Diet NPO;  Tolerated Diet: Yes   Intake/Output Totals  Intake/Output          04/09/23 0700 - 04/10/23 0659     9299-8140 1900-0659 Total        Intake    P.O.  480  600 1080    Total Intake [phone]      Output    Urine  --  -- --    Urine Occurrence 2 x 0 x 2 x    Stool  --  -- --    Stool Occurrence 0 x 0 x 0 x    Total Output -- -- --             Activity Activity: Up ad lib  Level of Assistance: Independent  Activity Tolerance: Tolerated Well  Ambulation Attempts this Shift: Did not ambulate this shift    Telemetry Cardiac Rhythm: Normal Sinus Rhythm (04/09/23 1900)   Telemetry Abnormalities No     Labs No results found for: FUNCTIONAL, PLTAGGREG    Lab Results   Component Value Date    RBC 2.88 (L) 04/10/2023    WBC 10.8 04/10/2023    HCT 27.2 (L) 04/10/2023    HGB 9.0 (L) 04/10/2023    PLATELETCNT 303 04/10/2023    MCH 31.1 04/10/2023    MCHC 32.9 04/10/2023    MCV 94.4 04/10/2023    MPV 7.7 04/10/2023     Lab Results   Component Value Date    ALBUMIN 3.3 04/10/2023    SODIUM 136 04/10/2023    POTASSIUM 5.4 (H) 04/10/2023    CHLORIDE 100 (L) 04/10/2023    CO2 19 (L) 04/10/2023    ANIONGAP 17 04/10/2023    GLUCOSE 143 (H) 04/10/2023    CREATININE 12.1 (HH) 04/10/2023    BUN 72 (H) 04/10/2023    CALCIUM 7.3 (L) 04/10/2023    PROTEINTOTAL 7.1 04/10/2023    TBILIRUBIN 0.4 04/10/2023    ALP 56 04/10/2023    AST 14 04/10/2023    ALTSGPT 7 (L) 04/10/2023    OSMOLALITY 296 04/10/2023    AGRATIO 0.9 04/10/2023     BC 6 (L) 04/10/2023    ESTIMATEDGFR 5 04/10/2023         Taking all meds Yes   Any PRN meds given? Yes Roxi     Current IV Infusions     Diuretics      SDOH Screen Assessed? Not Assessed   Discharge Planning Home   Expected Discharge Date    Education Provided            Electronically signed by Erling Canning, LPN at 98/84/7974  6:36 AM EST

## 2023-04-11 NOTE — Progress Notes (Signed)
 Formatting of this note might be different from the original.   Patient for a capsule endoscopy as an in-patient for Dr. Forest.  I reviewed the procedure, risks and complications (aspiration, technical difficulty, missing images, obstruction) with this patient.  Consent for treatment signed per patient and he wishes to proceed with this procedure.   The patient denies any pacemakers, defibrillators or transmitters of any kind.     I advised NO MRI scans x 30 days, unless Pillcam has positively been passed.  I also reviewed the monitor, sensors, diet, medications, phone number for follow up questions and where to return the equipment.  The patient verbalized understanding.    Electronically signed by Scot Milling at 04/11/2023  7:52 AM EST

## 2023-04-11 NOTE — Progress Notes (Signed)
 Formatting of this note might be different from the original.  Pt to recovery bed 10 . No complaints of pain or n/v. Abd soft and non tender. Encouraged to pass flatus. VSS. Family to bedside. Safety precautions in place. Bed in low and locked position. Side rails up x 2. Call light in reach. Will monitor.  Electronically signed by Cleatus Lenis at 04/11/2023  8:45 AM EST

## 2023-04-11 NOTE — Unmapped (Signed)
 Formatting of this note might be different from the original.  EGD and colonoscopy done. See Provation report under procedures tab    Electronically signed by Josetta Huddle, MD at 04/11/2023  8:53 AM EST

## 2023-04-11 NOTE — Progress Notes (Signed)
 Formatting of this note might be different from the original.  Dr Janese Banks in to see pt at bedside  Electronically signed by Lynetta Mare at 04/11/2023  8:56 AM EST

## 2023-04-11 NOTE — Progress Notes (Signed)
 Formatting of this note is different from the original.  Nutrition Evaluation    Subjective:              Subjective: Pt NPO and in ENDO this AM. Pt has HD, hx HD PTA. Intake has been fair and pt had previously received a Renal Cardiac diet.  Will monitor for diet advancement, following for needs.     Nutrition Assessment:  Timepoint: Admit  Current Appetite: fair appetitie  Usual  Weight: 83.9 kg (184 lb 15.5 oz) (03/13/2023)    Initial Weight:  Weight: 86.1 kg (189 lb 13.1 oz)    Current Weight:  Current Weight: 84 kg    BMI:  Dietician Calculated BMI: 25.83    Allergies:  Allergies   Allergen Reactions    Nsaids (Non-Steroidal Anti-Inflammatory Drug) Other (See Comments)     CANNOT TAKE DUE TO KIDNEY FAILURE   CANNOT TAKE DUE TO KIDNEY FAILURE         Diet Orders:  NPO: Yes    Feeding Route:      Estimated Needs:  Kcals: 2100-2520 kcal(25-30 kcal/kg current wt)  Needs based on: Kcal/kg - specify (Comment) (current wt of 84 kg)  Protein (g): 101-118 gm(1.2-1.4 gm/kg AIBW)  Fluid (ml): as tolerated with HD    Prognosis:  Nutrition Risk: Medium Risk (follow up 5-6 days)  Nutrition Recommendations: Continued inpatient monitoring    Inadequate oral food/beverage intake related to lack of access to foods as evidenced by noted NPO status. Pt being seen due to LOS and placed at medium risk due to HD, to monitor intake. Pt has adequate body fat stores. Labs include elevated K, BUN, Creat. Will follow as needed.     RESULTS DESIRED:  PO intake + to>=75%, Diet tolerance, Diet advancement, Maintain /Improve weight, Prevent weight loss, and NPO<3 days    PLAN:   Monitor weight, Monitor labs, Monitor PO intake, and Provide recommendations as appropriate      Electronically signed by Anderson, Kirstin at 04/11/2023  7:55 AM EST

## 2023-04-11 NOTE — Unmapped External Note (Signed)
 Formatting of this note might be different from the original.    Problem: Discharge Planning  Goal: Knowledge of discharge instructions  Description: Interventions:  - Education, renal diet  - Medication reconciliation  - Education, post discharge follow up  - Education, when to call provider  Outcome: Ongoing    Electronically signed by Colette Bale, LPN at 98/83/7974  1:23 AM EST

## 2023-04-11 NOTE — Unmapped External Note (Signed)
 Formatting of this note is different from the original.  Nursing End of Shift Summary    Pertinent changes to patient conditions and/or care this shift: Patient in bed, took bowel prep last night,       Vital Signs Weight: 84 kg (185 lb 3 oz) (04/11/23 0500)    Temp: 98.6 F (37 C)  Temp Source: Oral    Heart Rate:72    BP: 132/72    Respirations: 17   Oxygen Saturation SpO2: 98 %  O2 Delivery: Room air  O2 Device: None (Room air)  O2 Flow Rate (l/min): 0 l/min    Incentive Spirometry Incentive Spirometry: No      Diet Diet NPO;  Tolerated Diet: Yes   Intake/Output Totals  Intake/Output          04/10/23 0700 - 04/11/23 0659     9299-8140 1900-0659 Total        Intake    P.O.  240  480 720    I.V.  --  0 0    Blood  --  0 0    Other  500  0 500    Total Intake [phone]      Output    Urine  --  0 0    Urine -- 0 0    Weight of Briefs in mL -- 0 0    Urine Occurrence 1 x 0 x 1 x    Emesis/NG output  --  0 0    Emesis -- 0 0    Emesis Occurrence 0 x 0 x 0 x    Other  3210  0 3210    Other -- 0 0    HD Total Volume Removed 3210 -- 3210    Stool  --  0 0    Stool Occurrence 0 x 0 x 0 x    Stool -- 0 0    Blood  --  0 0    Blood output -- 0 0    Total Output 3210 0 3210             Activity Activity: Up ad lib  Level of Assistance: Independent  Activity Tolerance: Tolerated Fair  Ambulation Attempts this Shift: 1    Telemetry Cardiac Rhythm: Normal Sinus Rhythm (04/10/23 2000)   Telemetry Abnormalities No     Labs No results found for: FUNCTIONAL, PLTAGGREG    Lab Results   Component Value Date    RBC 2.93 (L) 04/11/2023    WBC 9.8 04/11/2023    HCT 27.4 (L) 04/11/2023    HGB 9.1 (L) 04/11/2023    PLATELETCNT 315 04/11/2023    MCH 31.0 04/11/2023    MCHC 33.0 04/11/2023    MCV 93.7 04/11/2023    MPV 7.9 04/11/2023     Lab Results   Component Value Date    ALBUMIN 3.5 04/11/2023    SODIUM 136 04/11/2023    POTASSIUM 5.3 (H) 04/11/2023    CHLORIDE 100 (L) 04/11/2023    CO2 24 04/11/2023    ANIONGAP 12 04/11/2023     GLUCOSE 109 04/11/2023    CREATININE 10.5 (HH) 04/11/2023    BUN 57 (H) 04/11/2023    CALCIUM 8.1 (L) 04/11/2023    PROTEINTOTAL 7.1 04/11/2023    TBILIRUBIN 0.5 04/11/2023    ALP 55 04/11/2023    AST 13 04/11/2023    ALTSGPT 12 04/11/2023    OSMOLALITY 288 04/11/2023    AGRATIO 1.0 04/11/2023  BC 5 (L) 04/11/2023    ESTIMATEDGFR 6 04/11/2023         Taking all meds No   Any PRN meds given? No    Current IV Infusions     Diuretics      SDOH Screen Assessed? Not Assessed   Discharge Planning Home   Expected Discharge Date    Education Provided Other (Comment) Patient has been to bathroom having bowel movements routinely           Electronically signed by Colette Bale, LPN at 98/83/7974  6:15 AM EST

## 2023-04-11 NOTE — Unmapped External Note (Signed)
 Formatting of this note might be different from the original.    Problem: Discharge Planning  Goal: Knowledge of discharge instructions  Description: Interventions:  - Education, renal diet  - Medication reconciliation  - Education, post discharge follow up  - Education, when to call provider  Outcome: Ongoing    Problem: Fluid Volume, Excess  Goal: Maintain appropriate body weight  Description: Instructions:  - Fluid restriction  - Education, fluid restriction  - Intake and output measurement  - Daily weight  Outcome: Ongoing    Problem: Electrolyte Imbalance, Risk for (Renal Faliure)  Goal: Maintain a normal sinus heart rhythm with regular rate  Description: Interventions:  - Vitals/O2 sat/telemetry monitoring  Outcome: Ongoing  Goal: Maintain normal serum electrolyte levels  Description: Interventions:  - Monitor laboratory data as ordered and report deviations to provider  Outcome: Ongoing    Problem: Infection, Risk for Central Venous Catheter Associated Bloodstream Infection  Goal: Absence of central venous catheter-associated bloodstream infection  Description: Interventions:  - Optimal catheter site selection, with avoidance of the femoral vein for central venous access in adult patients  - Maximal barrier precautions upon insertion  - Hand hygiene  - Chlorhexidine skin antisepsis  - Central line needs assessment  Outcome: Ongoing    Problem: Coping, Ineffective  Goal: Effective coping  Description: Interventions:  - Ineffective coping signs and symptoms assessment  - Consult to social services  - Cognitive emotional support  - Provide continuum of care to patient and family  - Education, healthy daily routines  - Education, disease process  Outcome: Ongoing    Problem: Skin Integrity, Impaired, Risk for  Goal: Absence of pressure injury  Description: Interventions:  - Skin assessment  - Patient repositioning every 2 hours if decreased mobility  Outcome: Ongoing    Problem: Infection, Risk for  Goal: Patient  will be free from infection  Description: Interventions:  1. Monitor for signs and symptoms of infection: fever, increased WBC, burning with urination or chills  2. Monitor insertion site for redness, tenderness, or drainage  3. Hand hygiene per CDC guidelines  4. Discontinuation of invasive devices when not needed  5. Place patient in appropriate isolation  Outcome: Ongoing    Problem: Pain, Acute  Goal: Communication of presence of pain  Description: Interventions:  - Nonpharmacologic pain management  - Medication administration  - Education, pain scale  Outcome: Ongoing    Problem: Deep Venous Thrombosis, Risk of  Goal: Absence of deep venous thrombosis  Description: Interventions:  - Deep venous thrombosis risk assessment  - Ensure patient is receiving antithrombotic medication for VTE prophylaxis  - Activity promotion  - Graduated anti-embolic stocking management  - Intermittent pneumatic compression management.  Outcome: Ongoing    Problem: Falls, Moderate Risk For  Goal: Absence of falls  Description: Interventions:  - Non-skid footwear with ambulation  - Exercise program (if applicable) for strengthening  - Address the P's during each pt encounter (rounding); offer bathroom more frequently for patient on diuretics, bowel prep, etc.  - Siderails up X 2 (Upper left and upper right)  - Bed low position and wheels locked  - Chair/wheelchair locked  - Minimize line tethering  - Walkway free of clutter  - Nightlight on evening/night hours  - Keep door to pt room open (unless contraindication, isolation, etc.)  - Call light, phone, table, and/or other pt necessities in reach (pt MUST demonstrate ability to properly use call light)  - Appropriate fall bracelet on at all times  -  Provide pt/family with fall education on admission and transfers  - Reorient pt to environment as appropriate  - Remind pt/family to call for assistance with each encounter    Outcome: Ongoing    Problem: Inadequate Oral Food/Beverage   Intake  Goal: NPO < 3 days  Outcome: Ongoing    Electronically signed by Seena Macintosh, LPN at 98/83/7974  5:01 PM EST

## 2023-04-11 NOTE — Unmapped External Note (Signed)
 Formatting of this note might be different from the original.  Roberto Rice    Test Name: Creatinine  Results:  10.5  04/11/2023  Time: 0255  Received from: lab  R/V by: Alan BRAVO RN    (Required: Attempt notification within 30 minutes or document reason for no notification)    Physician: Dr.Guo  04/11/2023  Time: 0256  Notified: No - Due to Expected Result: decreased.  Dialysis patient       Electronically signed by Alto Alan, RN at 04/11/2023  2:57 AM EST

## 2023-04-11 NOTE — Progress Notes (Signed)
 Formatting of this note is different from the original.  Progress Note    Patient:Roberto Rice FMW:[id number]   DOB:[birth year]    Admit Date:04/06/2023 LOS: LOS: 4 days      Subjective:   Chief Complaint:uti   Interval History: no cp or soa.  No n/v/d    Objective:   Last Recorded Vital Signs: Temp: 98.7 F (37.1 C)  Heart Rate (Monitor): 64  Pulse: 99  BP: 121/59  Respirations: 16  SpO2: 100 %  O2 Flow Rate (l/min): 0 l/min    Intake/Output Summary (Last 24 hours) at 04/11/2023 1831  Last data filed at 04/11/2023 1528  Gross per 24 hour   Intake 540 ml   Output 0 ml   Net 540 ml     Pertinent Labs: CBC:   Lab Results   Component Value Date/Time    WBC 9.8 04/11/2023 0125    RBC 2.93 04/11/2023 0125    HGB 9.1 04/11/2023 0125    HCT 27.4 04/11/2023 0125    MCV 93.7 04/11/2023 0125    MCH 31.0 04/11/2023 0125    MCHC 33.0 04/11/2023 0125    RDW 16.4 04/11/2023 0125    MPV 7.9 04/11/2023 0125    PLATELETCNT 315 04/11/2023 0125     CMP:   Lab Results   Component Value Date/Time    SODIUM 136 04/11/2023 0125    POTASSIUM 5.3 04/11/2023 0125    CHLORIDE 100 04/11/2023 0125    CO2 24 04/11/2023 0125    GLUCOSE 109 04/11/2023 0125    BUN 57 04/11/2023 0125    CREATININE 10.5 04/11/2023 0125    CALCIUM 8.1 04/11/2023 0125    PROTEINTOTAL 7.1 04/11/2023 0125    TBILIRUBIN 0.5 04/11/2023 0125    ALP 55 04/11/2023 0125    AST 13 04/11/2023 0125    ALTSGPT 12 04/11/2023 0125    ALBUMIN 3.5 04/11/2023 0125    AGRATIO 1.0 04/11/2023 0125     Imaging:Reviewed    Physical Exam:  General Appearance: awake, alert   Lungs: clear to auscultation bilaterally   Heart: regular rate and rhythm  Extremities: no edema   Abdomen: soft, non-tender. Bowel sounds normal.   Skin: warm and dry   Neuro: axox3, cn 2-12 grossly intact  HEENT normocephalic, atraumatic  Neck trachea midline    Principal Problem:    Sepsis secondary to UTI (CMS/HCC)  Active Problems:    Rectal bleed    Renal transplant, status post    Anemia    Hypertension    ESRD (end  stage renal disease) on dialysis (CMS/HCC)    Sepsis secondary to UTI (CMS/HCC)  C/w empiric abx.  Follow cultures.  Blood cx x 2 NG. Urine cx negative.        ESRD - nephrology following for dialysis.    Renal transplant - renal allografts edematous. IV solumedrol per renal. Nephrology recommending transfer, [facility] on diversion. Patient refuses to go anywhere else.   Transfer pending     BRBPR-resolved. GI consulted. S/p endo, Monitor CBC. Hgb stable.     GI/DVT ppx - PPI.  Heparin  subQ.  Home vs transfer on DC     Signed:  Donnice Lynwood Barnacle, MD  04/11/2023  Electronically signed by Barnacle Donnice Lynwood, MD at 04/11/2023  6:34 PM EST

## 2023-04-11 NOTE — Progress Notes (Signed)
 Formatting of this note is different from the original.  Images from the original note were not included.      [facility]  PROGRESS NOTE     LOS: 4 days     Subjective:   Chief Complaint: Follow up on Dialysis needs while inpatient.    Clinical History: Roberto Rice is a 38 y.o. male admitted with Sepsis related to UTI.   Interval History: Patient awake. Voiced no new complaints. Breathing stable. Hemodynamics stable. Denied any problems on HD yesterday, Primary working on transfer to [facility] for graft extraction.     Review of Systems:  Constitutional: No fever/chills.   Respiratory: No shortness of breath. No coughs/colds.   Cardiac: No chest pain. No orthopnea/PND. No palpitations.   Musculoskeletal: No swelling.    Objective:   Vitals:  Patient Vitals for the past 12 hrs:   BP Temp Temp src Pulse Resp SpO2 Weight   04/11/23 1331 157/85 98.8 F (37.1 C) Oral 99 16 100 % --   04/11/23 0900 134/67 -- -- 83 12 100 % --   04/11/23 0855 128/63 -- -- 85 14 100 % --   04/11/23 0850 123/71 -- -- 87 17 100 % --   04/11/23 0845 114/62 -- -- 88 18 99 % --   04/11/23 0753 (!) 182/83 -- -- 70 20 98 % --   04/11/23 0500 -- -- -- -- -- -- 84 kg (185 lb 3 oz)   04/11/23 0427 132/72 98.6 F (37 C) Oral 72 17 98 % --     Physical Exam:  GENERAL: Not in acute distress. Appears comfortable.  EYES, EARS, NOSE & THROAT: Normocephalic. Clear sclera. Moist oral mucosa.  NECK: No JVP elevation.  LUNGS: Good air entry. Clear to auscultation.  CARDIAC: Regular rhythm. Good S1 and S2. No rubs appreciated.  MUSCULOSKELETAL: No dependent edema.    NEUROLOGIC: Awake. No myoclonus.     Labs:   CBC:   Recent Labs     04/11/23  0125 04/10/23  1840 04/10/23  1156 04/10/23  0651 04/10/23  0035 04/09/23  1855 04/09/23  1225 04/09/23  0657 04/09/23  0039   WBC 9.8  --   --   --  10.8  --   --   --  11.8*   RBC 2.93*  --   --   --  2.88*  --   --   --  2.82*   HGB 9.1* 9.2* 9.2* 8.5* 9.0* 9.1* 8.1*   < > 8.8*   HCT  27.4* 27.3* 28.3* 26.1* 27.2* 27.8* 24.8*   < > 26.5*   MCV 93.7  --   --   --  94.4  --   --   --  93.8   PLATELETCNT 315  --   --   --  303  --   --   --  289    < > = values in this interval not displayed.     BMP:   Recent Labs     04/11/23  0125 04/10/23  0035 04/09/23  0039   SODIUM 136 136 136   POTASSIUM 5.3* 5.4* 5.0   CHLORIDE 100* 100* 99*   CO2 24 19* 23   BUN 57* 72* 46*   CREATININE 10.5* 12.1* 9.7*   ESTIMATEDGFR 6 5 6    CALCIUM 8.1* 7.3* 8.0*   MAGNESIUM 1.9 1.7 1.9   PHOSPHORUS 6.1* 6.0* 6.6*   GLUCOSE 109 143*  112*   OSMOLALITY 288 296 285   TBILIRUBIN 0.5 0.4 0.3   ALP 55 56 66   AST 13 14 12    ALBUMIN 3.5 3.3 3.5     ABG: No results for input(s): PHBLOOD, PCO2ARTERIAL, PO2, HCO3, O2SATURATION, O2, MODE in the last 72 hours.    I/O:  Date 04/11/23 0000 - 04/11/23 2359   Shift 9999-8540 1500-2259 2300-2359 24 Hour Total   INTAKE   P.O. 0 60  60   I.V. 0   0   Blood 0   0   Other 0   0   Shift Total 0 60  60   OUTPUT   Urine 0   0   Emesis/NG output 0   0   Other 0   0   Stool 0   0   Blood 0   0   Shift Total 0   0     Intake/Output Summary (Last 24 hours) at 04/11/2023 1624  Last data filed at 04/11/2023 1528  Gross per 24 hour   Intake 540 ml   Output 0 ml   Net 540 ml     Assessment/Plan   Roberto Rice is a 38 y.o. male currently being followed for Dialysis needs while inpatient.      1.  End Stage Renal Disease. Patient is HD-dependent (MWF at [facility]). Recommend additional HD today for better solute clearance- Patient declined.   2.  Renal Hypertension. Bp fluctuating. Hold off Midodrine. Target Bp <140/80 mmHg.  3.  Anemia of Chronic Disease. Hgb level below goal. Monitor Hgb trend.  4.  Hyperphosphatemia. Continue Renvela.   5.  Hyperkalemia. Recommended extra HD for solute clearance- Patient declined. Agreeable to [facility].   6.  Renal allograft rejection. Awaiting transfer to [facility] for graft extraction. Continue Prednisone  Po.   7.  Medications. Please dose  all meds for CrCl < 10 ml/min.    Meds, Labs reviewed.    Signed:  Ryann Pangan, M.D., MD  04/11/2023  4:24 PM    Electronically signed by Pangan, Ryann Aquino, MD at 04/11/2023  4:30 PM EST

## 2023-04-11 NOTE — Unmapped External Note (Signed)
 Formatting of this note is different from the original.  Nursing End of Shift Summary    Pertinent changes to patient conditions and/or care this shift: colonoscopy this am      Vital Signs Weight: 84 kg (185 lb 3 oz) (04/11/23 0500)    Temp: 98.7 F (37.1 C)  Temp Source: Oral    Heart Rate:99    BP: 121/59    Respirations: 16   Oxygen Saturation SpO2: 100 %  O2 Delivery: Room air  O2 Device: None (Room air)  O2 Flow Rate (l/min): 0 l/min    Incentive Spirometry Incentive Spirometry: No      Diet Diet Renal;Cardiac; Fluid 1200cc; Pro 80G; NA 4000mg  (NAS); K 2000mg  ( )  Tolerated Diet: Yes   Intake/Output Totals  Intake/Output          04/10/23 0700 - 04/11/23 0659 04/11/23 0700 - 04/12/23 0659     9299-8140 1900-0659 Total 9299-8140 1900-0659 Total        Intake    P.O.  240  480 720  60  350 410    I.V.  --  0 0  --  -- --    Blood  --  0 0  --  -- --    Other  500  0 500  --  -- --    Total Intake [phone] 60 350 410      Output    Urine  --  0 0  --  -- --    Urine -- 0 0 -- -- --    Weight of Briefs in mL -- 0 0 -- -- --    Urine Occurrence 1 x 0 x 1 x -- 0 x 0 x    Emesis/NG output  --  0 0  --  -- --    Emesis -- 0 0 -- -- --    Emesis Occurrence 0 x 0 x 0 x -- -- --    Other  3210  0 3210  --  -- --    Other -- 0 0 -- -- --    HD Total Volume Removed 3210 -- 3210 -- -- --    Stool  --  0 0  --  -- --    Stool Occurrence 0 x 0 x 0 x -- -- --    Stool -- 0 0 -- -- --    Blood  --  0 0  --  -- --    Blood output -- 0 0 -- -- --    Total Output 3210 0 3210 -- -- --             Activity Activity: Up ad lib  Level of Assistance: Independent  Activity Tolerance: Tolerated Fair  Ambulation Attempts this Shift: Did not ambulate this shift    Telemetry Cardiac Rhythm: Normal Sinus Rhythm (04/11/23 0900)   Telemetry Abnormalities No     Labs No results found for: FUNCTIONAL, PLTAGGREG    Lab Results   Component Value Date    RBC 2.93 (L) 04/11/2023    WBC 9.8 04/11/2023    HCT 27.4 (L) 04/11/2023    HGB  9.1 (L) 04/11/2023    PLATELETCNT 315 04/11/2023    MCH 31.0 04/11/2023    MCHC 33.0 04/11/2023    MCV 93.7 04/11/2023    MPV 7.9 04/11/2023     Lab Results   Component Value Date    ALBUMIN 3.5 04/11/2023  SODIUM 136 04/11/2023    POTASSIUM 5.3 (H) 04/11/2023    CHLORIDE 100 (L) 04/11/2023    CO2 24 04/11/2023    ANIONGAP 12 04/11/2023    GLUCOSE 109 04/11/2023    CREATININE 10.5 (HH) 04/11/2023    BUN 57 (H) 04/11/2023    CALCIUM 8.1 (L) 04/11/2023    PROTEINTOTAL 7.1 04/11/2023    TBILIRUBIN 0.5 04/11/2023    ALP 55 04/11/2023    AST 13 04/11/2023    ALTSGPT 12 04/11/2023    OSMOLALITY 288 04/11/2023    AGRATIO 1.0 04/11/2023    BC 5 (L) 04/11/2023    ESTIMATEDGFR 6 04/11/2023         Taking all meds Yes   Any PRN meds given? No    Current IV Infusions     Diuretics      SDOH Screen Assessed? Not Assessed   Discharge Planning Home   Expected Discharge Date    Education Provided Other (Comment) pill cam            Electronically signed by Seena Macintosh, LPN at 98/83/7974  7:22 PM EST

## 2023-04-11 NOTE — Progress Notes (Signed)
 Formatting of this note might be different from the original.  Returned from ENDO awake and alert.a little drowsy. No complaints at this time.   Electronically signed by Merrilee Jansky, LPN at 02/72/5366  9:43 AM EST

## 2023-04-11 NOTE — Progress Notes (Signed)
 Formatting of this note might be different from the original.  Evening meds given. Patient assessed and VS reviewed.  No signs of acute distress noted .Patient states no further needs at this time. Care ongoing.     Electronically signed by Lenna Gallant, RN at 04/12/2023  3:46 AM EST

## 2023-04-11 NOTE — Unmapped External Note (Signed)
 Formatting of this note might be different from the original.    Problem: Inadequate Oral Food/Beverage  Intake  Goal: NPO < 3 days  Outcome: Ongoing   Plan:  monitor for diet advancement  Intervention: initial assessment completed  Electronically signed by Anderson, Kirstin at 04/11/2023  7:56 AM EST

## 2023-04-12 NOTE — Progress Notes (Signed)
 Formatting of this note is different from the original.  Progress Note    Patient:Roberto Rice FMW:[id number]   DOB:[birth year]    Admit Date:04/06/2023 LOS: LOS: 6 days      Subjective:   Chief Complaint:  f/u sepsis and hypotension from UTI, ESRD (allograft rejection) rectal bleed , BLA   Interval History: s/p colon egd pill cam,  hb stable  pill cam poor prep, afew small avms , no active bleeding  Hb 9.5    Impression:            - Diverticulosis in the left colon.                          - Two bleeding colonic angiodysplastic lesions.                          Treated with argon plasma coagulation (APC).                          - Internal hemorrhoids.                          - No specimens collected.     Impression:            - Normal esophagus.                          - Z-line regular, 44 cm from the incisors.                          - Small hiatal hernia.                          - Normal stomach.                          - Normal first portion of the duodenum and second                          portion of the duodenum.                          - Successful completion of the Video Capsule                          Enteroscope placement.     Objective:   Last Recorded Vital Signs: Temp: 97.8 F (36.6 C)  Heart Rate (Monitor): 72  Pulse: (!) 114  BP: 112/56  Respirations: 18  SpO2: 100 %  O2 Flow Rate (l/min): 0 l/min    Pertinent Labs:     Component      Latest Ref Rng 04/11/2023 04/12/2023   WBC      4.5 - 11.0 10*3/uL 9.8  9.9    RBC      4.50 - 5.90 10*6/uL 2.93 (L)  3.01 (L)    HGB      13.5 - 17.5 g/dL 9.1 (L)  9.5 (L)    HCT      37.0 - 53.0 % 27.4 (L)  28.4 (L)    MCV      80.0 - 100.0 fL 93.7  94.4  MCHC      32.0 - 36.0 g/dL 66.9  66.5    MCH      26.0 - 34.0 pg 31.0  31.5    RDW      10.7 - 18.7 % 16.4  16.7    MPV      6.5 - 10.0 fL 7.9  7.6    Platelet Cnt      150 - 450 10*3/uL 315  323    Differential Type Auto  Auto    Neutrophils      35.0 - 66.0 % 84.0 (H)  67.7 (H)    Lymphocytes      24.0  - 44.0 % 9.0 (L)  17.8 (L)    Monocytes      2.1 - 13.3 % 6.8  12.1    Eosinophils      0.3 - 5.0 % 0.1 (L)  2.0    Basophils      0.0 - 1.0 % 0.1  0.4    Neutrophils Abs      1.5 - 8.5 10*3/uL 8.2  6.7    Lymphocytes Abs      1.1 - 5.0 10*3/uL 0.9 (L)  1.8    Monocytes Abs      0.0 - 1.4 10*3/uL 0.7  1.2    Eosinophils Abs      0.0 - 0.5 10*3/uL 0.0  0.2    Basophils Abs      0.0 - 0.1 10*3/uL 0.0  0.0    SODIUM      135 - 145 mmol/L 136  137    POTASSIUM      3.6 - 5.0 mmol/L 5.3 (H)  4.3    CHLORIDE      101 - 111 mmol/L 100 (L)  99 (L)    CO2      21 - 31 mmol/L 24  21    ANION GAP 12  17    GLUCOSE      70 - 110 mg/dL 890  85    Creatinine      0.6 - 1.2 mg/dL 89.4 (HH)  86.4 (HH)    BUN      2 - 32 mg/dL 57 (H)  87 (H)    CALCIUM      8.5 - 10.5 mg/dL 8.1 (L)  7.4 (L)    PROTEIN TOTAL      6.1 - 7.8 g/dL 7.1  6.7    Albumin      3.2 - 5.0 g/dL 3.5  3.3    T BILIRUBIN      0.2 - 1.0 mg/dL 0.5  0.3    ALP      42 - 121 [iU]/L 55  53    AST      10 - 42 [iU]/L 13  10    ALT      10 - 60 [iU]/L 12  8 (L)    OSMOLALITY      266 - 309  288  300    A/G Ratio 1.0  1.0    B/C      10 - 20  5 (L)  6 (L)    ESTIMATED GFR      mL/min 6  4      Legend:  (L) Low  (H) High  (HH) High Panic    Imaging:Patient has no current imaging    Physical Exam:  General Appearance: alert, no distress, appears stated age  Lungs:  clear  to auscultation bilaterally  Heart:  regular rate and rhythm, S1, S2 normal  Extremities:  no edema cyanosis  Abdomen: soft, non-tender.  Bowel sounds normal.  No masses,  no organomegaly    Principal Problem:    Sepsis secondary to UTI (CMS/HCC)  Active Problems:    Rectal bleed    Renal transplant, status post    Anemia    Hypertension    ESRD (end stage renal disease) on dialysis (CMS/HCC)    Assessment:    1.  Rectal bleed from hemorrhoids    2. Diverticulosis     3. Colon and small bowel avms    4. ESRD on HD  transplant rejected   5. Sepsis/UTI improved    Plan:    1.  No further GI w/u     2.  Discussed with patient pill cam findings    3. To [facility] for explant of renal allograft and eventual new transplant   4. On prednisone     Signed:  Rome Florence, M.D., MD  04/13/2023  Electronically signed by Florence Rome, MD at 04/13/2023  6:37 AM EST

## 2023-04-12 NOTE — Unmapped External Note (Signed)
 Formatting of this note might be different from the original.  Roberto Rice    Test Name: Creat  Results:  13.5  04/12/2023  Time: 0913  Received from: Lab  R/V by: Verla    (Required: Attempt notification within 30 minutes or document reason for no notification)    Physician: Tina  04/12/2023  Time: 0913  Notified: No - Due to Expected Result: dialysis patient       Electronically signed by Knox Verla, RN at 04/12/2023  9:13 AM EST

## 2023-04-12 NOTE — Progress Notes (Signed)
 Formatting of this note might be different from the original.  Evening meds given. Patient assessed and VS reviewed.No signs of acute distress noted . Patient states no further needs at this time. Care ongoing.     Electronically signed by Lenna Gallant, RN at 04/13/2023  6:46 AM EST

## 2023-04-12 NOTE — Unmapped External Note (Signed)
 Formatting of this note might be different from the original.    Problem: Discharge Planning  Goal: Knowledge of discharge instructions  Description: Interventions:  - Education, renal diet  - Medication reconciliation  - Education, post discharge follow up  - Education, when to call provider  Outcome: Ongoing    Problem: Fluid Volume, Excess  Goal: Maintain appropriate body weight  Description: Instructions:  - Fluid restriction  - Education, fluid restriction  - Intake and output measurement  - Daily weight  Outcome: Ongoing    Problem: Electrolyte Imbalance, Risk for (Renal Faliure)  Goal: Maintain a normal sinus heart rhythm with regular rate  Description: Interventions:  - Vitals/O2 sat/telemetry monitoring  Outcome: Ongoing    Problem: Electrolyte Imbalance, Risk for (Renal Faliure)  Goal: Maintain normal serum electrolyte levels  Description: Interventions:  - Monitor laboratory data as ordered and report deviations to provider  Outcome: Ongoing    Problem: Infection, Risk for Central Venous Catheter Associated Bloodstream Infection  Goal: Absence of central venous catheter-associated bloodstream infection  Description: Interventions:  - Optimal catheter site selection, with avoidance of the femoral vein for central venous access in adult patients  - Maximal barrier precautions upon insertion  - Hand hygiene  - Chlorhexidine skin antisepsis  - Central line needs assessment  Outcome: Ongoing    Electronically signed by Lenna Gallant, RN at 04/12/2023  4:21 AM EST

## 2023-04-12 NOTE — Progress Notes (Signed)
 Formatting of this note might be different from the original.  Off floor for dialysis.   Electronically signed by Merrilee Jansky, LPN at 21/30/8657 11:52 AM EST

## 2023-04-12 NOTE — Progress Notes (Signed)
 Formatting of this note might be different from the original.  In room from dialysis.   Electronically signed by Merrilee Jansky, LPN at 43/32/9518  2:51 PM EST

## 2023-04-12 NOTE — Unmapped External Note (Signed)
 Formatting of this note might be different from the original.    Problem: Discharge Planning  Goal: Knowledge of discharge instructions  Description: Interventions:  - Education, renal diet  - Medication reconciliation  - Education, post discharge follow up  - Education, when to call provider  Outcome: Ongoing    Problem: Fluid Volume, Excess  Goal: Maintain appropriate body weight  Description: Instructions:  - Fluid restriction  - Education, fluid restriction  - Intake and output measurement  - Daily weight  Outcome: Ongoing    Problem: Electrolyte Imbalance, Risk for (Renal Faliure)  Goal: Maintain a normal sinus heart rhythm with regular rate  Description: Interventions:  - Vitals/O2 sat/telemetry monitoring  Outcome: Ongoing  Goal: Maintain normal serum electrolyte levels  Description: Interventions:  - Monitor laboratory data as ordered and report deviations to provider  Outcome: Ongoing    Problem: Infection, Risk for Central Venous Catheter Associated Bloodstream Infection  Goal: Absence of central venous catheter-associated bloodstream infection  Description: Interventions:  - Optimal catheter site selection, with avoidance of the femoral vein for central venous access in adult patients  - Maximal barrier precautions upon insertion  - Hand hygiene  - Chlorhexidine skin antisepsis  - Central line needs assessment  Outcome: Ongoing    Problem: Coping, Ineffective  Goal: Effective coping  Description: Interventions:  - Ineffective coping signs and symptoms assessment  - Consult to social services  - Cognitive emotional support  - Provide continuum of care to patient and family  - Education, healthy daily routines  - Education, disease process  Outcome: Ongoing    Problem: Skin Integrity, Impaired, Risk for  Goal: Absence of pressure injury  Description: Interventions:  - Skin assessment  - Patient repositioning every 2 hours if decreased mobility  Outcome: Ongoing    Problem: Infection, Risk for  Goal: Patient  will be free from infection  Description: Interventions:  1. Monitor for signs and symptoms of infection: fever, increased WBC, burning with urination or chills  2. Monitor insertion site for redness, tenderness, or drainage  3. Hand hygiene per CDC guidelines  4. Discontinuation of invasive devices when not needed  5. Place patient in appropriate isolation  Outcome: Ongoing    Problem: Pain, Acute  Goal: Communication of presence of pain  Description: Interventions:  - Nonpharmacologic pain management  - Medication administration  - Education, pain scale  Outcome: Ongoing    Problem: Deep Venous Thrombosis, Risk of  Goal: Absence of deep venous thrombosis  Description: Interventions:  - Deep venous thrombosis risk assessment  - Ensure patient is receiving antithrombotic medication for VTE prophylaxis  - Activity promotion  - Graduated anti-embolic stocking management  - Intermittent pneumatic compression management.  Outcome: Ongoing    Problem: Falls, Moderate Risk For  Goal: Absence of falls  Description: Interventions:  - Non-skid footwear with ambulation  - Exercise program (if applicable) for strengthening  - Address the P's during each pt encounter (rounding); offer bathroom more frequently for patient on diuretics, bowel prep, etc.  - Siderails up X 2 (Upper left and upper right)  - Bed low position and wheels locked  - Chair/wheelchair locked  - Minimize line tethering  - Walkway free of clutter  - Nightlight on evening/night hours  - Keep door to pt room open (unless contraindication, isolation, etc.)  - Call light, phone, table, and/or other pt necessities in reach (pt MUST demonstrate ability to properly use call light)  - Appropriate fall bracelet on at all times  -  Provide pt/family with fall education on admission and transfers  - Reorient pt to environment as appropriate  - Remind pt/family to call for assistance with each encounter    Outcome: Ongoing    Problem: Inadequate Oral Food/Beverage   Intake  Goal: NPO < 3 days  Outcome: Ongoing    Electronically signed by Seena Macintosh, LPN at 98/82/7974  5:02 PM EST

## 2023-04-12 NOTE — Progress Notes (Signed)
 Formatting of this note is different from the original.  Progress Note    Patient:Roberto Rice FMW:[id number]   DOB:1985-09-10    Admit Date:04/06/2023 LOS: LOS: 5 days      Subjective:   Chief Complaint:uti   Interval History: denies chest pain or soa.  No n/v/d.  Feeling well     Objective:   Last Recorded Vital Signs: Temp: 98 F (36.7 C)  Heart Rate (Monitor): 72  Pulse: 73  BP: 133/83  Respirations: 18  SpO2: 100 %  O2 Flow Rate (l/min): 0 l/min    Intake/Output Summary (Last 24 hours) at 04/12/2023 1456  Last data filed at 04/12/2023 1448  Gross per 24 hour   Intake 910 ml   Output 4800 ml   Net -3890 ml     Pertinent Labs: CBC:   Lab Results   Component Value Date/Time    WBC 9.9 04/12/2023 0720    RBC 3.01 04/12/2023 0720    HGB 9.5 04/12/2023 0720    HCT 28.4 04/12/2023 0720    MCV 94.4 04/12/2023 0720    MCH 31.5 04/12/2023 0720    MCHC 33.4 04/12/2023 0720    RDW 16.7 04/12/2023 0720    MPV 7.6 04/12/2023 0720    PLATELETCNT 323 04/12/2023 0720     CMP:   Lab Results   Component Value Date/Time    SODIUM 137 04/12/2023 0720    POTASSIUM 4.3 04/12/2023 0720    CHLORIDE 99 04/12/2023 0720    CO2 21 04/12/2023 0720    GLUCOSE 85 04/12/2023 0720    BUN 87 04/12/2023 0720    CREATININE 13.5 04/12/2023 0720    CALCIUM 7.4 04/12/2023 0720    PROTEINTOTAL 6.7 04/12/2023 0720    TBILIRUBIN 0.3 04/12/2023 0720    ALP 53 04/12/2023 0720    AST 10 04/12/2023 0720    ALTSGPT 8 04/12/2023 0720    ALBUMIN 3.3 04/12/2023 0720    AGRATIO 1.0 04/12/2023 0720     Imaging:Reviewed    Physical Exam:  General Appearance: awake, alert   Lungs: cta    Heart: regular rate and rhythm  Abdomen: active sounds   Neuro: axox3,      Principal Problem:    Sepsis secondary to UTI (CMS/HCC)  Active Problems:    Rectal bleed    Renal transplant, status post    Anemia    Hypertension    ESRD (end stage renal disease) on dialysis (CMS/HCC)    Sepsis secondary to UTI (CMS/HCC)  C/w empiric abx.  Follow cultures.  Blood cx x 2 NG. Urine cx  negative.        ESRD - nephrology following for dialysis.    Renal transplant - renal allografts edematous. Steroids per renal. Nephrology recommending transfer, [facility] on diversion. Patient refuses to go anywhere else.   Transfer pending     BRBPR-resolved. GI consulted. S/p endo, Monitor CBC. Hgb stable.     GI/DVT ppx - PPI.  Heparin  subQ.  Home vs transfer on DC   If patient continues to do well, possible dc tomorrow with follow up with [facility] as outpt   Signed:  Donnice Lynwood Barnacle, MD  04/12/2023  Electronically signed by Barnacle Donnice Lynwood, MD at 04/12/2023  8:10 PM EST

## 2023-04-12 NOTE — Progress Notes (Signed)
 Formatting of this note is different from the original.      Nephrology Follow-up Note    CC: Follow-up on ESRD    Interval history: Over the last 24 hours, symptoms/labs progression:  unchanged. Associated symptoms: systolic blood pressure around 130    Physical Exam  Constitutional: negative for distress  Cardiovascular:  negative for edema  Pulmonary: negative for audible wheezing    Recent Labs     04/12/23  0720 04/11/23  2103 04/11/23  0125 04/10/23  1840 04/10/23  1156 04/10/23  0651 04/10/23  0035 04/09/23  1855 04/09/23  1225   WBC 9.9  --  9.8  --   --   --  10.8  --   --    RBC 3.01*  --  2.93*  --   --   --  2.88*  --   --    HGB 9.5*  --  9.1* 9.2* 9.2* 8.5* 9.0* 9.1* 8.1*   HCT 28.4*  --  27.4* 27.3* 28.3* 26.1* 27.2* 27.8* 24.8*   MCV 94.4  --  93.7  --   --   --  94.4  --   --    PLATELETCNT 323  --  315  --   --   --  303  --   --    SODIUM 137  --  136  --   --   --  136  --   --    OSMOLALITY 300  --  288  --   --   --  296  --   --    POTASSIUM 4.3 4.8 5.3*  --   --   --  5.4*  --   --    MAGNESIUM 1.9  --  1.9  --   --   --  1.7  --   --    PHOSPHORUS 6.0*  --  6.1*  --   --   --  6.0*  --   --    CHLORIDE 99*  --  100*  --   --   --  100*  --   --    CO2 21  --  24  --   --   --  19*  --   --    ANIONGAP 17  --  12  --   --   --  17  --   --    GLUCOSE 85  --  109  --   --   --  143*  --   --    BUN 87*  --  57*  --   --   --  72*  --   --    CREATININE 13.5*  --  10.5*  --   --   --  12.1*  --   --    CALCIUM 7.4*  --  8.1*  --   --   --  7.3*  --   --    TBILIRUBIN 0.3  --  0.5  --   --   --  0.4  --   --    ALP 53  --  55  --   --   --  56  --   --    AST 10  --  13  --   --   --  14  --   --    ALTSGPT 8*  --  12  --   --   --  7*  --   --    ALBUMIN 3.3  --  3.5  --   --   --  3.3  --   --      Assessment/plan  ESRD on hemodialysis MWF schedule.  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  Awaiting transfer to [facility].  Continue prednisone .  If clinically stable,  he can be discharged, and follow-up with transplant center on outpatient basis    Roberto Rice, M.D.   04/12/2023    Voice transcription technology was used for dictation of this note and sound-alike words might be erroneously placed despite reviewing the note for accuracy.  Errors in dictation may reflect use of voice recognition software and not all errors in transcription may have been detected prior to signing.    Electronically signed by Rice Janee, MD at 04/12/2023  3:02 PM EST

## 2023-04-13 NOTE — Progress Notes (Signed)
 Formatting of this note might be different from the original.  Report given Darl Pikes at [facility].  Electronically signed by Thane Edu, RN at 04/13/2023  4:19 PM EST

## 2023-04-13 NOTE — Unmapped External Note (Signed)
 Formatting of this note might be different from the original.  Dakhari Zuver    Test Name: CR  Results:  10.2  04/13/2023  Time: 0445  Received from: Lab  R/V by: Annabella, RN    (Required: Attempt notification within 30 minutes or document reason for no notification)    Physician: Georgianna  04/13/2023  Time: 0445  Notified: No - Due to Other: dialysis pt       Electronically signed by Tanda Annabella, RN at 04/13/2023  5:22 AM EST

## 2023-04-13 NOTE — Unmapped (Signed)
 Formatting of this note might be different from the original.   Care hand off given to [facility] RN  Electronically signed by Henry Russel, RN at 04/13/2023  7:46 AM EST

## 2023-04-13 NOTE — Progress Notes (Signed)
 Formatting of this note is different from the original.      Nephrology Follow-up Note    CC: Follow-up on ESRD    Interval history: Over the last 24 hours, symptoms/labs progression:  unchanged. Associated symptoms: systolic blood pressure around 110-130/70-90 . Patient seen today sitting up on couch at bedside. Patient hopeful to be discharged soon.    Physical Exam  Constitutional: negative for distress  Cardiovascular:  negative for edema  Pulmonary: negative for audible wheezing    Recent Labs     04/13/23  0326 04/12/23  0720 04/11/23  2103 04/11/23  0125 04/10/23  1840 04/10/23  1156 04/10/23  0651   WBC 9.2 9.9  --  9.8  --   --   --    RBC 3.14* 3.01*  --  2.93*  --   --   --    HGB 9.7* 9.5*  --  9.1* 9.2* 9.2* 8.5*   HCT 29.8* 28.4*  --  27.4* 27.3* 28.3* 26.1*   MCV 95.0 94.4  --  93.7  --   --   --    PLATELETCNT 326 323  --  315  --   --   --    SODIUM 137 137  --  136  --   --   --    OSMOLALITY 290 300  --  288  --   --   --    POTASSIUM 4.1 4.3 4.8 5.3*  --   --   --    MAGNESIUM 2.0 1.9  --  1.9  --   --   --    PHOSPHORUS 5.1* 6.0*  --  6.1*  --   --   --    CHLORIDE 100* 99*  --  100*  --   --   --    CO2 21 21  --  24  --   --   --    ANIONGAP 16 17  --  12  --   --   --    GLUCOSE 97 85  --  109  --   --   --    BUN 59* 87*  --  57*  --   --   --    CREATININE 10.2* 13.5*  --  10.5*  --   --   --    CALCIUM 7.4* 7.4*  --  8.1*  --   --   --    TBILIRUBIN 0.3 0.3  --  0.5  --   --   --    ALP 57 53  --  55  --   --   --    AST 11 10  --  13  --   --   --    ALTSGPT 7* 8*  --  12  --   --   --    ALBUMIN 3.4 3.3  --  3.5  --   --   --      Assessment/plan  ESRD. Patient gets hemodialysis at [facility] on MWF under management of Dr. Lesta.  Plan for next hemodialysis treatment Monday if remains admitted. Avoid nephrotoxin exposure, including but not limited to NSAIDs, IV dye, MRI gadolinium and Fleets.   Renal graft rejection. Awaiting transfer to [facility]. Per hospitalist documentation patient  refusing transfer. Continue on steroids at this time.   Anemia in CKD. Hgb goal of 10-11 g/dL with advanced kidney disease. Current Hgb 9.7 g/dL. No indications  for transfusion. Due to national blood shortage transfusion only if Hgb < 8 g/dL.     Less than half of the total split-share visit time needed to collect history, review the records and examine the patient was spent by me individually (10 minutes)    Randine Ricewick   04/13/2023    I individually spent 11 minutes, more than half of the total time needed to generate this split-share documentation. I performed an independent review of the records before the face-to-face encounter, and I independently collected history and examined the patient. The medical decision making was generated largely by me based on my own review of the findings, face-to-face time with patient and/or family, and using my nephrology expertise in reviewing the clinical and laboratory facts to generate a care plan.     I independently evaluated the patient, performed a physical examination, and review of the history, labs and medications. I reviewed the findings of the nurse practitioner, and my pertinent findings are as follows:    On my exam: negative for distress. No edema in the lower extremities.   Assessment/Plan:   ESRD on hemodialysis MWF.  No indication for dialysis today  Renal graft rejection.  I explained to the patient the risk of prolonged hospitalization.  He does not have tenderness in the abdomen and therefore symptomatically better.  I explained to the patient the possible alternative plan of continuing prednisone  on outpatient basis and following up with the transplant center at [facility] on outpatient basis.  I think if he does not have a bed by tomorrow, this alternative should be considered.  The patient is worried about transportation, and we would appreciate social worker support    Janee Mcalpine, M.D.     Voice transcription technology was used for dictation of this  note and sound-alike words might be erroneously placed despite reviewing the note for accuracy.  Errors in dictation may reflect use of voice recognition software and not all errors in transcription may have been detected prior to signing.       Electronically signed by Mcalpine Janee, MD at 04/13/2023 11:17 AM EST

## 2023-04-13 NOTE — Progress Notes (Signed)
 Formatting of this note might be different from the original.  [facility] ETA updated to 1730.  Electronically signed by Payton Doughty, RN at 04/13/2023  4:42 PM EST

## 2023-04-13 NOTE — Progress Notes (Signed)
 Formatting of this note might be different from the original.  Patient resting quietly in bed. No signs of acute distress at this time. Vital signs are stable. Call light, phone, and bedside table within reach. Bed in lowest position with side rails up x2 and wheels locked. Nonskid socks on at this time. Walkways free of clutter. Will continue to monitor patient.      Electronically signed by Tracy Castellani, RN at 04/13/2023  5:38 PM EST

## 2023-04-13 NOTE — Progress Notes (Signed)
 Formatting of this note is different from the original.  -------------------  IV TO PO PHARMACY INTERCHANGE -----------------------    Roberto Rice is currently receiving Pantoprazole.      Patient is afebrile, tolerating PO medications, and cardiac diet.  Labs  as follows:    Lab Results   Component Value Date    HGB 9.7 (L) 04/13/2023    HGB 9.5 (L) 04/12/2023    HGB 9.1 (L) 04/11/2023    HCT 29.8 (L) 04/13/2023    HCT 28.4 (L) 04/12/2023    HCT 27.4 (L) 04/11/2023     No evidence of GI bleed.  No transfusion in previous 48hrs.    Will change Pantoprazole to po therapy at this time per P&T protocol.    Thank you,  Jeoffrey Pepper, [facility]  04/13/2023  8:28 AM      Electronically signed by Pepper Jeoffrey, PHARMD at 04/13/2023  8:29 AM EST

## 2023-04-13 NOTE — Unmapped External Note (Signed)
 Formatting of this note might be different from the original.    Problem: Discharge Planning  Goal: Knowledge of discharge instructions  Description: Interventions:  - Education, renal diet  - Medication reconciliation  - Education, post discharge follow up  - Education, when to call provider  Outcome: Ongoing    Problem: Fluid Volume, Excess  Goal: Maintain appropriate body weight  Description: Instructions:  - Fluid restriction  - Education, fluid restriction  - Intake and output measurement  - Daily weight  Outcome: Ongoing    Problem: Electrolyte Imbalance, Risk for (Renal Faliure)  Goal: Maintain normal serum electrolyte levels  Description: Interventions:  - Monitor laboratory data as ordered and report deviations to provider  Outcome: Ongoing    Problem: Infection, Risk for Central Venous Catheter Associated Bloodstream Infection  Goal: Absence of central venous catheter-associated bloodstream infection  Description: Interventions:  - Optimal catheter site selection, with avoidance of the femoral vein for central venous access in adult patients  - Maximal barrier precautions upon insertion  - Hand hygiene  - Chlorhexidine skin antisepsis  - Central line needs assessment  Outcome: Ongoing    Problem: Coping, Ineffective  Goal: Effective coping  Description: Interventions:  - Ineffective coping signs and symptoms assessment  - Consult to social services  - Cognitive emotional support  - Provide continuum of care to patient and family  - Education, healthy daily routines  - Education, disease process  Outcome: Ongoing    Problem: Pain, Acute  Goal: Communication of presence of pain  Description: Interventions:  - Nonpharmacologic pain management  - Medication administration  - Education, pain scale  Outcome: Ongoing    Electronically signed by Lenna Gallant, RN at 04/13/2023  7:59 AM EST

## 2023-04-13 NOTE — Progress Notes (Signed)
 Formatting of this note might be different from the original.  Per transfer line, bed available at [facility], [address]. Nursing to call report to [phone]. Primary nurse and Dr. Tina updated.  Electronically signed by Secundino Grate, RN at 04/13/2023  2:19 PM EST

## 2023-04-13 NOTE — Progress Notes (Signed)
 Formatting of this note might be different from the original.  [facility] called to transport patient to [facility]. ETA 1700.   Electronically signed by Payton Doughty, RN at 04/13/2023  4:10 PM EST

## 2023-04-13 NOTE — Unmapped External Note (Signed)
 Formatting of this note is different from the original.  Nursing End of Shift Summary    Pertinent changes to patient conditions and/or care this shift: no acute changes this shift  .Cr 10.2.      Vital Signs Weight: 85 kg (187 lb 6.4 oz) (04/13/23 0548)    Temp: 97.8 F (36.6 C)  Temp Source: Axillary    Heart Rate:114    BP: 112/56    Respirations: 18   Oxygen Saturation SpO2: 100 %  O2 Delivery: Room air  O2 Device: None (Room air)  O2 Flow Rate (l/min): 0 l/min    Incentive Spirometry Incentive Spirometry: No      Diet Diet Renal;Cardiac; Fluid 1200cc; Pro 80G; NA 4000mg  (NAS); K 2000mg  ( )  Tolerated Diet: Yes   Intake/Output Totals  Intake/Output          04/12/23 0700 - 04/13/23 0659     9299-8140 1900-0659 Total        Intake    P.O.  350  0 350    Other  500  -- 500    Total Intake 850 0 850      Output    Urine  --  -- --    Urine Occurrence 0 x -- 0 x    Other  4800  -- 4800    HD Total Volume Removed 4800 -- 4800    Total Output 4800 -- 4800             Activity Activity: Up ad lib  Level of Assistance: Independent  Activity Tolerance: Tolerated Fair  Ambulation Attempts this Shift: Did not ambulate this shift    Telemetry Cardiac Rhythm: Normal Sinus Rhythm (04/11/23 0900)   Telemetry Abnormalities No     Labs No results found for: FUNCTIONAL, PLTAGGREG    Lab Results   Component Value Date    RBC 3.14 (L) 04/13/2023    WBC 9.2 04/13/2023    HCT 29.8 (L) 04/13/2023    HGB 9.7 (L) 04/13/2023    PLATELETCNT 326 04/13/2023    MCH 30.8 04/13/2023    MCHC 32.4 04/13/2023    MCV 95.0 04/13/2023    MPV 7.9 04/13/2023     Lab Results   Component Value Date    ALBUMIN 3.4 04/13/2023    SODIUM 137 04/13/2023    POTASSIUM 4.1 04/13/2023    CHLORIDE 100 (L) 04/13/2023    CO2 21 04/13/2023    ANIONGAP 16 04/13/2023    GLUCOSE 97 04/13/2023    CREATININE 10.2 (HH) 04/13/2023    BUN 59 (H) 04/13/2023    CALCIUM 7.4 (L) 04/13/2023    PROTEINTOTAL 7.2 04/13/2023    TBILIRUBIN 0.3 04/13/2023    ALP 57 04/13/2023     AST 11 04/13/2023    ALTSGPT 7 (L) 04/13/2023    OSMOLALITY 290 04/13/2023    AGRATIO 0.9 04/13/2023    BC 6 (L) 04/13/2023    ESTIMATEDGFR 6 04/13/2023         Taking all meds No   Any PRN meds given? No    Current IV Infusions     Diuretics      SDOH Screen Assessed? Not Assessed   Discharge Planning Home   Expected Discharge Date    Education Provided Other (Comment) Plan of care           Electronically signed by Lenna Gallant, RN at 04/13/2023  7:13 AM EST

## 2023-04-13 NOTE — Discharge Summary (Signed)
 Formatting of this note is different from the original.  Physician Discharge Summary    Patient ID:  MRN: [id number]  Name: Roberto Rice  Age: 38 y.o.  Birthday:  12-20-1985  Admit Date: 04/06/2023 12:19 AM  Discharge Date:  04/13/23  Unit: 3G380/3G380J  Admitting Physician:   Discharge Physician: Donnice Lynwood Barnacle, MD    Discharge Diagnosis:  Principal Problem:    Sepsis secondary to UTI (CMS/HCC)  Active Problems:    Rectal bleed    Renal transplant, status post    Anemia    Hypertension    ESRD (end stage renal disease) on dialysis (CMS/HCC)  Renal graft rejection   Rectal bleed       Hospital Course:  patient treated for sepsis and hypotension from UTI, ESRD (allograft rejection) rectal bleed.   Patient completed abx doing stay.  Blood and urine cx neg.   Transfer to transplant center recommended.   [facility] contacted.  There were on diversion for several days, but obtained bed on day of dc.     Patient had brbpr, seen by gi, under endo and pill cam.    Hb stable.   Patient feeling well, complaint free on dc.      Sepsis secondary to UTI (CMS/HCC)  treated empiric abx.  Follow cultures.  Blood cx x 2 NG. Urine cx negative.    Completed empiric abx.        ESRD - nephrology following for dialysis.    Renal transplant - renal allografts edematous. IV solumedrol per renal. Nephrology recommending transfer, [facility] on diversion at first. Patient refuses to go anywhere else.   Patient symtoms improved, steroids weaned to po prednisone .   [facility] bed obtained.       BRBPR-resolved. GI consulted. S/p endo, Monitor CBC. Hgb stable.  s/p colon egd pill cam,   pill cam poor prep, afew small avms , no active bleeding    Impression:            - Diverticulosis in the left colon.                          - Two bleeding colonic angiodysplastic lesions.                          Treated with argon plasma coagulation (APC).                          - Internal hemorrhoids.                          - No specimens collected.      Impression:            - Normal esophagus.                          - Z-line regular, 44 cm from the incisors.                          - Small hiatal hernia.                          - Normal stomach.                          -  Normal first portion of the duodenum and second                          portion of the duodenum.                          - Successful completion of the Video Capsule                          Enteroscope placement.         Physical Exam on the Date of Discharge:  General Appearance: awake, alert, nad    Lungs: clear to auscultation bilaterally   Heart: regular rate and rhythm  Extremities: no edema   Abdomen: soft, non-tender. Bowel sounds normal.   Skin: warm and dry   Neuro: axox3, cn 2-12 grossly intact  HEENT normocephalic, atraumatic  Neck trachea midline    Consults:    IP CONSULT TO SEPSIS NURSE NAVIGATOR  IP CONSULT TO NEPHROLOGY  IP CONSULT TO NEPHROLOGY  IP CONSULT TO GASTROENTEROLOGY  IP CONSULT TO IV THERAPY  IP CONSULT TO VASCULAR SURGERY    Disposition:  [facility]     Discharge Condition:  stable     Discharge Medications and Orders:    Current Discharge Medication List       START taking these medications    Details   B complex-vitamin C-folic acid (NEPHROVITE RX) tablet Take 1 Tablet by mouth Once Daily.    Associated Diagnoses: ESRD (end stage renal disease) (CMS/HCC)         CONTINUE these medications which have CHANGED    Details   midodrine (PROAMATINE) 5 mg tablet Take 2 Tabs by mouth Three times a day for 30 days.    Associated Diagnoses: ESRD (end stage renal disease) (CMS/HCC)     pantoprazole (PROTONIX) 40 mg DR tablet Take 1 Tablet by mouth Every 12 hours for 14 days.    Associated Diagnoses: Rectal bleed; Melena     predniSONE  (DELTASONE ) 10 mg tablet Take 3 Tabs by mouth Once Daily.    Associated Diagnoses: Renal transplant, status post         CONTINUE these medications which have NOT CHANGED    Details   gabapentin (NEURONTIN) 100 mg capsule Take 100 mg by mouth  Three times a day.     oxyCODONE  (ROXICODONE ) 10 mg immediate release tablet Take 10 mg by mouth Once daily as needed for Pain.     traMADoL (ULTRAM) 50 mg tablet Take 50 mg by mouth Three times a day as needed for Pain.     sucroferric oxyhydroxide (VELPHORO) 500 mg Chew Take 500 mg by mouth Three times a day. With meals     cholecalciferol, vitamin D3, (VITAMIN D3) Tab tablet Take 2,000 Units by mouth Every morning.           Discharge Procedure Orders   Diet Cardiac   Scheduling Instructions: Renal     Activity As Tolerated     Follow up with your PCP, Rufino Lye, APRN   Order Comments: Rufino Lye, APRN     Order Specific Question Answer Comments   Follow up when? 1 week      Follow-Up with nephrology as sch, GI 2 weeks; Other   Order Comments: Follow-up with Physician     Order Specific Question Answer Comments   Follow up with whom? nephrology as sch,  GI 2 weeks    Follow up when? Other      PCP:  Rufino Lye, APRN  [address] [place] [address] / [place] [address]  [phone]    Future Appointments   Date Time Provider Department Center   04/16/2023 10:00 AM Rufino Lye, APRN [facility] None   06/11/2023  8:45 AM Rufino Lye, APRN [facility] None     ----  Time spent on discharge process 35 min     Signed:  Donnice Lynwood Barnacle, MD  04/13/2023      Electronically signed by Barnacle Donnice Lynwood, MD at 04/13/2023  3:18 PM EST

## 2023-04-13 NOTE — Unmapped External Note (Signed)
 Formatting of this note might be different from the original.    Problem: Discharge Planning  Goal: Knowledge of discharge instructions  Description: Interventions:  - Education, renal diet  - Medication reconciliation  - Education, post discharge follow up  - Education, when to call provider  Outcome: Ongoing    Problem: Fluid Volume, Excess  Goal: Maintain appropriate body weight  Description: Instructions:  - Fluid restriction  - Education, fluid restriction  - Intake and output measurement  - Daily weight  Outcome: Ongoing    Problem: Electrolyte Imbalance, Risk for (Renal Faliure)  Goal: Maintain a normal sinus heart rhythm with regular rate  Description: Interventions:  - Vitals/O2 sat/telemetry monitoring  Outcome: Ongoing  Goal: Maintain normal serum electrolyte levels  Description: Interventions:  - Monitor laboratory data as ordered and report deviations to provider  Outcome: Ongoing    Problem: Infection, Risk for Central Venous Catheter Associated Bloodstream Infection  Goal: Absence of central venous catheter-associated bloodstream infection  Description: Interventions:  - Optimal catheter site selection, with avoidance of the femoral vein for central venous access in adult patients  - Maximal barrier precautions upon insertion  - Hand hygiene  - Chlorhexidine skin antisepsis  - Central line needs assessment  Outcome: Ongoing    Problem: Coping, Ineffective  Goal: Effective coping  Description: Interventions:  - Ineffective coping signs and symptoms assessment  - Consult to social services  - Cognitive emotional support  - Provide continuum of care to patient and family  - Education, healthy daily routines  - Education, disease process  Outcome: Ongoing    Problem: Skin Integrity, Impaired, Risk for  Goal: Absence of pressure injury  Description: Interventions:  - Skin assessment  - Patient repositioning every 2 hours if decreased mobility  Outcome: Ongoing    Problem: Infection, Risk for  Goal: Patient  will be free from infection  Description: Interventions:  1. Monitor for signs and symptoms of infection: fever, increased WBC, burning with urination or chills  2. Monitor insertion site for redness, tenderness, or drainage  3. Hand hygiene per CDC guidelines  4. Discontinuation of invasive devices when not needed  5. Place patient in appropriate isolation  Outcome: Ongoing    Problem: Pain, Acute  Goal: Communication of presence of pain  Description: Interventions:  - Nonpharmacologic pain management  - Medication administration  - Education, pain scale  Outcome: Ongoing    Problem: Deep Venous Thrombosis, Risk of  Goal: Absence of deep venous thrombosis  Description: Interventions:  - Deep venous thrombosis risk assessment  - Ensure patient is receiving antithrombotic medication for VTE prophylaxis  - Activity promotion  - Graduated anti-embolic stocking management  - Intermittent pneumatic compression management.  Outcome: Ongoing    Problem: Falls, Moderate Risk For  Goal: Absence of falls  Description: Interventions:  - Non-skid footwear with ambulation  - Exercise program (if applicable) for strengthening  - Address the P's during each pt encounter (rounding); offer bathroom more frequently for patient on diuretics, bowel prep, etc.  - Siderails up X 2 (Upper left and upper right)  - Bed low position and wheels locked  - Chair/wheelchair locked  - Minimize line tethering  - Walkway free of clutter  - Nightlight on evening/night hours  - Keep door to pt room open (unless contraindication, isolation, etc.)  - Call light, phone, table, and/or other pt necessities in reach (pt MUST demonstrate ability to properly use call light)  - Appropriate fall bracelet on at all times  -  Provide pt/family with fall education on admission and transfers  - Reorient pt to environment as appropriate  - Remind pt/family to call for assistance with each encounter    Outcome: Ongoing    Problem: Inadequate Oral Food/Beverage   Intake  Goal: NPO < 3 days  Outcome: Ongoing    Electronically signed by Tracy Castellani, RN at 04/13/2023  5:48 PM EST

## 2023-05-03 ENCOUNTER — Other Ambulatory Visit: Payer: Self-pay

## 2023-05-03 ENCOUNTER — Emergency Department (HOSPITAL_COMMUNITY): Payer: Medicaid Other

## 2023-05-03 ENCOUNTER — Emergency Department (HOSPITAL_COMMUNITY)
Admission: EM | Admit: 2023-05-03 | Discharge: 2023-05-03 | Disposition: A | Discharge status: Home / Self Care | Payer: Medicaid Other | Attending: Emergency Medicine | Admitting: Emergency Medicine

## 2023-05-03 ENCOUNTER — Encounter (HOSPITAL_COMMUNITY): Payer: Self-pay

## 2023-05-03 DIAGNOSIS — N186 End stage renal disease: Secondary | ICD-10-CM | POA: Diagnosis not present

## 2023-05-03 DIAGNOSIS — Z992 Dependence on renal dialysis: Secondary | ICD-10-CM | POA: Diagnosis not present

## 2023-05-03 DIAGNOSIS — Z5329 Procedure and treatment not carried out because of patient's decision for other reasons: Secondary | ICD-10-CM | POA: Insufficient documentation

## 2023-05-03 DIAGNOSIS — I509 Heart failure, unspecified: Secondary | ICD-10-CM | POA: Diagnosis not present

## 2023-05-03 DIAGNOSIS — I132 Hypertensive heart and chronic kidney disease with heart failure and with stage 5 chronic kidney disease, or end stage renal disease: Secondary | ICD-10-CM | POA: Diagnosis present

## 2023-05-03 LAB — CBC WITH DIFFERENTIAL/PLATELET
Abs Immature Granulocytes: 0.02 10*3/uL (ref 0.00–0.07)
Basophils Absolute: 0 10*3/uL (ref 0.0–0.1)
Basophils Relative: 1 %
Eosinophils Absolute: 0.3 10*3/uL (ref 0.0–0.5)
Eosinophils Relative: 5 %
HCT: 28.4 % — ABNORMAL LOW (ref 39.0–52.0)
Hemoglobin: 8.8 g/dL — ABNORMAL LOW (ref 13.0–17.0)
Immature Granulocytes: 0 %
Lymphocytes Relative: 24 %
Lymphs Abs: 1.5 10*3/uL (ref 0.7–4.0)
MCH: 30.2 pg (ref 26.0–34.0)
MCHC: 31 g/dL (ref 30.0–36.0)
MCV: 97.6 fL (ref 80.0–100.0)
Monocytes Absolute: 0.7 10*3/uL (ref 0.1–1.0)
Monocytes Relative: 10 %
Neutro Abs: 3.7 10*3/uL (ref 1.7–7.7)
Neutrophils Relative %: 60 %
Platelets: 528 10*3/uL — ABNORMAL HIGH (ref 150–400)
RBC: 2.91 MIL/uL — ABNORMAL LOW (ref 4.22–5.81)
RDW: 15.7 % — ABNORMAL HIGH (ref 11.5–15.5)
WBC: 6.2 10*3/uL (ref 4.0–10.5)
nRBC: 0 % (ref 0.0–0.2)

## 2023-05-03 LAB — COMPREHENSIVE METABOLIC PANEL
ALT: 11 U/L (ref 0–44)
AST: 20 U/L (ref 15–41)
Albumin: 2.8 g/dL — ABNORMAL LOW (ref 3.5–5.0)
Alkaline Phosphatase: 55 U/L (ref 38–126)
Anion gap: 17 — ABNORMAL HIGH (ref 5–15)
BUN: 42 mg/dL — ABNORMAL HIGH (ref 6–20)
CO2: 22 mmol/L (ref 22–32)
Calcium: 6.7 mg/dL — ABNORMAL LOW (ref 8.9–10.3)
Chloride: 97 mmol/L — ABNORMAL LOW (ref 98–111)
Creatinine, Ser: 16.25 mg/dL — ABNORMAL HIGH (ref 0.61–1.24)
GFR, Estimated: 3 mL/min — ABNORMAL LOW (ref >60)
Glucose, Bld: 79 mg/dL (ref 70–99)
Potassium: 3.7 mmol/L (ref 3.5–5.1)
Sodium: 136 mmol/L (ref 135–145)
Total Bilirubin: 0.6 mg/dL (ref 0.0–1.2)
Total Protein: 6.7 g/dL (ref 6.5–8.1)

## 2023-05-03 LAB — LIPASE, BLOOD: Lipase: 54 U/L — ABNORMAL HIGH (ref 11–51)

## 2023-05-03 LAB — HEPATITIS B SURFACE ANTIGEN: Hepatitis B Surface Ag: NONREACTIVE

## 2023-05-03 MED ORDER — ALTEPLASE 2 MG IJ SOLR: 2.0000 mg | Freq: Once | INTRAMUSCULAR | Status: DC | PRN

## 2023-05-03 MED ORDER — LIDOCAINE HCL (PF) 1 % IJ SOLN: 5.0000 mL | INTRAMUSCULAR | Status: DC | PRN

## 2023-05-03 MED ORDER — HEPARIN SODIUM (PORCINE) 1000 UNIT/ML DIALYSIS: 1000.0000 [IU] | INTRAMUSCULAR | Status: DC | PRN

## 2023-05-03 MED ORDER — NEPRO/CARBSTEADY PO LIQD: 237.0000 mL | ORAL | Status: DC | PRN

## 2023-05-03 MED ORDER — LIDOCAINE-PRILOCAINE 2.5-2.5 % EX CREA: 1.0000 | TOPICAL_CREAM | CUTANEOUS | Status: DC | PRN

## 2023-05-03 MED ORDER — ANTICOAGULANT SODIUM CITRATE 4% (200MG/5ML) IV SOLN: 5.0000 mL | Status: DC | PRN

## 2023-05-03 MED ORDER — CHLORHEXIDINE GLUCONATE CLOTH 2 % EX PADS: 6.0000 | MEDICATED_PAD | Freq: Every day | CUTANEOUS | Status: DC

## 2023-05-03 MED ORDER — PENTAFLUOROPROP-TETRAFLUOROETH EX AERO: 1.0000 | INHALATION_SPRAY | CUTANEOUS | Status: DC | PRN

## 2023-05-03 NOTE — Procedures (Signed)
 I was present at the procedure, reviewed the HD regimen and made appropriate changes.   Larry Poag MD  [facility] 05/03/2023, 5:24 PM

## 2023-05-03 NOTE — ED Provider Notes (Signed)
 [name] [facility]ERG[facility] DEPARTMENT AT [facility] Provider Note   CSN: [id number] Arrival date & time: 05/03/23  1111     History  Chief Complaint  Patient presents with   Needs Dialysis   Post-op Problem    Austin Hardy is a 38 y.o. 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  States he is on a Monday, Wednesday, Friday schedule.  His last dialysis session was on 04/23/2023, 10 days ago.  Patient reports mild discomfort at the surgical site on his abdomen, but no acute worsening of pain.  No nausea, vomiting, fever or chills.  HPI     Home Medications Prior to Admission medications   Medication Sig Start Date End Date Taking? Authorizing Provider  midodrine  (PROAMATINE ) 10 MG tablet Take 1 tablet (10 mg total) by mouth 3 (three) times daily with meals. Patient taking differently: Take 10 mg by mouth See admin instructions. Take one tablet by mouth if blood pressure systolic (top #) is less than 899 and/or if (bottom #) diastolic is less than 40. 03/17/23  Yes Patsy Lenis, MD  predniSONE  (DELTASONE ) 10 MG tablet Take 30 mg by mouth daily. 04/14/23  Yes [provider]  gabapentin (NEURONTIN) 100 MG capsule Take 100 mg by mouth 3 (three) times daily as needed. Patient not taking: Reported on 05/03/2023    [provider]  Oxycodone HCl 20 MG TABS Take 1 tablet by mouth daily as needed.    [provider]  sucroferric oxyhydroxide (VELPHORO) 500 MG chewable tablet Chew 500 mg by mouth 3 (three) times daily with meals. 03/08/23   [provider]  traMADol  (ULTRAM ) 50 MG tablet Take 50 mg by mouth in the morning, at noon, and at bedtime.    [provider]      Allergies    Nsaids    Review of Systems   Review of Systems  Gastrointestinal:  Negative for abdominal pain.    Physical Exam Updated Vital Signs BP 118/68   Pulse 87    Temp 98.3 F (36.8 C)   Resp 16   SpO2 100%  Physical Exam Vitals and nursing note reviewed.  Constitutional:      General: He is not in acute distress.    Appearance: He is well-developed.  HENT:     Head: Normocephalic and atraumatic.  Eyes:     Conjunctiva/sclera: Conjunctivae normal.  Cardiovascular:     Rate and Rhythm: Normal rate and regular rhythm.     Heart sounds: No murmur heard. Pulmonary:     Effort: Pulmonary effort is normal. No respiratory distress.     Breath sounds: Normal breath sounds.  Abdominal:     Palpations: Abdomen is soft.     Tenderness: There is no abdominal tenderness.     Comments: Abdomen with several surgical incision sites, clean dry and intact without erythema or edema. Closed with staples  Abdomen soft and non-tender to palpation  Musculoskeletal:        General: No swelling.     Cervical back: Neck supple.  Skin:    General: Skin is warm and dry.     Capillary Refill: Capillary refill takes less than 2 seconds.  Neurological:     Mental Status: He is alert.  Psychiatric:        Mood and Affect: Mood normal.     ED Results / Procedures / Treatments  Labs (all labs ordered are listed, but only abnormal results are displayed) Labs Reviewed  CBC WITH DIFFERENTIAL/PLATELET - Abnormal; Notable for the following components:      Result Value   RBC 2.91 (*)    Hemoglobin 8.8 (*)    HCT 28.4 (*)    RDW 15.7 (*)    Platelets 528 (*)    All other components within normal limits  COMPREHENSIVE METABOLIC PANEL - Abnormal; Notable for the following components:   Chloride 97 (*)    BUN 42 (*)    Creatinine, Ser 16.25 (*)    Calcium 6.7 (*)    Albumin 2.8 (*)    GFR, Estimated 3 (*)    Anion gap 17 (*)    All other components within normal limits  LIPASE, BLOOD - Abnormal; Notable for the following components:   Lipase 54 (*)    All other components within normal limits  HEPATITIS B SURFACE ANTIGEN    EKG None  Radiology DG  Chest 2 View Result Date: 05/03/2023 CLINICAL DATA:  recent nephrectomy, abdominal pain. EXAM: CHEST - 2 VIEW COMPARISON:  03/17/2023. FINDINGS: Bilateral lung fields are clear. Bilateral costophrenic angles are clear. Normal cardio-mediastinal silhouette. No acute osseous abnormalities. The soft tissues are within normal limits. IMPRESSION: No active cardiopulmonary disease. Electronically Signed   By: Ree Molt M.D.   On: 05/03/2023 14:35    Procedures Procedures    Medications Ordered in ED Medications - No data to display  ED Course/ Medical Decision Making/ A&P                                 Medical Decision Making Amount and/or Complexity of Data Reviewed Labs: ordered. Radiology: ordered.     Differential diagnosis includes but is not limited to ESRD requiring dialysis, surgical site infection  ED Course:  Patient well appearing, stable vitals. Abdomen soft and non-tender, surgical site healing well without sign of infection. No indication for further abdominal CT at this time. Chest x-ray without acute abnormality, no signs of fluid overload. Appears to be doing well from post op perspective I Ordered, and personally interpreted labs.  The pertinent results include:   CBC with low hemoglobin at 8.8 which appears to be at baseline. No leukocytosis CMP with Cr 16.25, K within normal limits Hep B non-reactive  Dr. Geralynn consulted who will plan to see patient in anticipation of dialysis Dr. Dean, my attending, also personally evaluated patient and agrees with plan of care   Impression: ESRD on dialysis  Disposition:  Care of this patient signed out to oncoming ED provider [name], PA-C. Patient should be undergoing dialysis with Dr. Geralynn. Disposition and treatment plan pending imaging results and clinical judgment of oncoming ED team.    Imaging Studies ordered: I ordered imaging studies including Chest x-ray  I independently visualized the imaging  with scope of interpretation limited to determining acute life threatening conditions related to emergency care. Imaging showed no acute abnormalities I agree with the radiologist interpretation  Consultations Obtained: I requested consultation with the nephrologist Dr. Geralynn,  and discussed lab and imaging findings as well as pertinent plan - they will come see patient in anticipation of dialysis                Final Clinical Impression(s) / ED Diagnoses Final diagnoses:  ESRD (end stage renal disease) on dialysis [facility])    Rx /  DC Orders ED Discharge Orders     None         Veta Palma, DEVONNA 05/03/23 1530    Dean Clarity, MD 05/03/23 [id number]

## 2023-05-03 NOTE — Progress Notes (Signed)
 Contacted by Luke with [facility] out-pt HD. Pt apparently has relocated here from [facility] and is working on getting set-up with [facility] in [place] (pt has a hx there in the past) for out-pt HD. Luke is requesting clinicals and labs in order to proceed with pt's clinic placement process. Kim aware that pt was in ED and contacted navigator. This info provided to nephrologist and renal NP. Will provide needed clinicals to assist with out-pt HD clinic placement.Will assist as needed.    Randine Mungo Renal Navigator [phone]

## 2023-05-03 NOTE — Progress Notes (Signed)
 Received patient in bed to unit.  Alert and oriented x4 Informed consent signed and in chart.   TX duration:3 hours  Patient requested to end tx 30 minutes early Transported back to the room  Alert, without acute distress.  Hand-off given to patient's nurse.   Access used: avf Access issues: none  Total UF removed: 1600 Medication(s) given: none Post HD VS: see table below Post HD weight: 79.1kg     05/03/23 2013  Vitals  Temp 98.5 F (36.9 C)  Temp Source Oral  BP 129/81  MAP (mmHg) 96  BP Location Right Arm  BP Method Automatic  Patient Position (if appropriate) Lying  Pulse Rate 95  Pulse Rate Source Monitor  ECG Heart Rate (!) 103  Resp 13  Oxygen Therapy  SpO2 100 %  O2 Device Room Air  Patient Activity (if Appropriate) In bed  Pulse Oximetry Type Continuous  During Treatment Monitoring  Blood Flow Rate (mL/min) 0 mL/min  Arterial Pressure (mmHg) 122.62 mmHg  Venous Pressure (mmHg) -91.51 mmHg  TMP (mmHg) 14.14 mmHg  Ultrafiltration Rate (mL/min) 765 mL/min  Dialysate Flow Rate (mL/min) 300 ml/min  Dialysate Potassium Concentration 3  Dialysate Calcium Concentration 2.5  Duration of HD Treatment -hour(s) 2.99 hour(s)  Cumulative Fluid Removed (mL) per Treatment  1611.47  HD Safety Checks Performed Yes  Intra-Hemodialysis Comments See progress note  Post Treatment  Dialyzer Clearance Lightly streaked  Hemodialysis Intake (mL) 0 mL  Liters Processed 71.8  Fluid Removed (mL) 1600 mL  Tolerated HD Treatment No (Comment)  Post-Hemodialysis Comments goal not met  AVG/AVF Arterial Site Held (minutes) 5 minutes  AVG/AVF Venous Site Held (minutes) 5 minutes  Fistula / Graft Left Forearm  No placement date or time found.   Placed prior to admission: Yes  Orientation: Left  Access Location: Forearm  Site Condition No complications  Fistula / Graft Assessment Present;Thrill;Bruit  Status Deaccessed;Flushed;Patent      [facility]

## 2023-05-03 NOTE — Progress Notes (Signed)
 Asked to see patient for hospital dialysis. Pt is in the process of moving back to [place] from [place] . He used to dialyze with [facility], Dr Katheen. He will be dialyzing soon w/ [facility] but they couldn't accept him w/o recent hep B labs. 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 Last HD was 2/04. Does not need heparin . 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 Doesn't usually have much fluid on. Is not taking any transplant medications. Exam shows no edema, clear lungs, on RA, staples in the RLQ. Pt alert and no confusion.   The plan will be for ED HD. Pt is not to be admitted. Pt will go to the dialysis unit upstairs when they are ready for the patient. When dialysis is completed pt will be sent back to ED for reassessment.   Myer Fret  MD  [facility] 05/03/2023, 3:53 PM  Recent Labs  Lab 05/03/23 1232  HGB 8.8*  ALBUMIN 2.8*  CALCIUM 6.7*  CREATININE 16.25*  K 3.7    Inpatient medications:  [START ON 05/04/2023] Chlorhexidine  Gluconate Cloth  6 each Topical Q0600

## 2023-05-03 NOTE — ED Notes (Addendum)
 Patient brought back to ED from dialysis.  I went into room, pt not there. Patient Stickers on bedside table but pt is gone.

## 2023-05-03 NOTE — ED Triage Notes (Signed)
 Pt arrives via [facility] from home. Pt is new to the area, he is in process of getting a set schedule at a dialysis center. PT last had dialysis on Tuesday. 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 PT reports discomfort in abdomen at the surgical site. He is AxOx4.

## 2023-05-04 LAB — HEPATITIS B CORE ANTIBODY, TOTAL: HEP B CORE AB: NEGATIVE

## 2023-05-04 LAB — HEPATITIS B SURFACE ANTIBODY, QUANTITATIVE: Hep B S AB Quant (Post): 1589 m[IU]/mL

## 2023-05-06 NOTE — Progress Notes (Addendum)
 Late Note Entry- May 06, 2023  Message left for Kim with [facility] out[facility] this morning. Awaiting a return call.   Randine Mungo Renal Navigator [phone]   Addendum: Tuesday, May 07, 2023 Spoke to Luke with [facility] out-[facility] this am. Luke confirms need for labs from ED on Friday. Labs faxed to [facility] for out-pt HD placement.

## 2023-11-04 ENCOUNTER — Encounter (HOSPITAL_COMMUNITY): Payer: Self-pay | Admitting: Emergency Medicine

## 2023-11-04 ENCOUNTER — Other Ambulatory Visit: Payer: Self-pay

## 2023-11-04 ENCOUNTER — Emergency Department (HOSPITAL_COMMUNITY)
Admission: EM | Admit: 2023-11-04 | Discharge: 2023-11-04 | Disposition: A | Discharge status: Home / Self Care | Attending: Emergency Medicine | Admitting: Emergency Medicine

## 2023-11-04 DIAGNOSIS — Z5329 Procedure and treatment not carried out because of patient's decision for other reasons: Secondary | ICD-10-CM | POA: Insufficient documentation

## 2023-11-04 DIAGNOSIS — N186 End stage renal disease: Secondary | ICD-10-CM | POA: Insufficient documentation

## 2023-11-04 DIAGNOSIS — Z992 Dependence on renal dialysis: Secondary | ICD-10-CM | POA: Insufficient documentation

## 2023-11-04 DIAGNOSIS — N185 Chronic kidney disease, stage 5: Secondary | ICD-10-CM

## 2023-11-04 NOTE — ED Triage Notes (Signed)
 Pt states he just moved here and does not have a dialysis center yet but is currently in the process of getting that arranged. Patient is a MWF dialysis pt and has not missed any treatments. Pt is requesting dialysis today. Has no complaints otherwise.

## 2023-11-04 NOTE — ED Provider Notes (Signed)
 [place] EMERGENCY DEPARTMENT AT [facility] Provider Note   CSN: [id number] Arrival date & time: 11/04/23  1021     Patient presents with: Needs Dialysis   Austin Hardy is a 38 y.o. male.   Patient came to get dialysis today.  He has dialysis Monday Wednesday Friday and has not missed any  The history is provided by the patient and medical records. No language interpreter was used.  Weakness Severity:  Mild Onset quality:  Gradual Timing:  Constant Progression:  Waxing and waning Chronicity:  New Relieved by:  Nothing Worsened by:  Nothing Ineffective treatments:  None tried Associated symptoms: no abdominal pain, no chest pain, no cough, no diarrhea, no frequency, no headaches and no seizures        Prior to Admission medications   Medication Sig Start Date End Date Taking? Authorizing Provider  carvedilol  (COREG ) 12.5 MG tablet Take 12.5 mg by mouth 2 (two) times daily with a meal. Patient not taking: Reported on 05/03/2023    [provider]  ferric citrate (AURYXIA) 1 GM 210 MG(Fe) tablet Take 420 mg by mouth 3 (three) times daily with meals. Patient not taking: Reported on 05/03/2023    [provider]  gabapentin (NEURONTIN) 100 MG capsule Take 100 mg by mouth 3 (three) times daily as needed. Patient not taking: Reported on 05/03/2023    [provider]  midodrine  (PROAMATINE ) 10 MG tablet Take 1 tablet (10 mg total) by mouth 3 (three) times daily with meals. Patient taking differently: Take 10 mg by mouth See admin instructions. Take one tablet by mouth if blood pressure systolic (top #) is less than 899 and/or if (bottom #) diastolic is less than 40. 03/17/23   Patsy Lenis, MD  Oxycodone HCl 20 MG TABS Take 1 tablet by mouth daily as needed. Patient not taking: Reported on 05/03/2023    [provider]  sucroferric oxyhydroxide (VELPHORO) 500 MG chewable tablet Chew 500 mg by mouth 3 (three) times daily with meals.  03/08/23   [provider]  traMADol  (ULTRAM ) 50 MG tablet Take 50 mg by mouth in the morning, at noon, and at bedtime. Patient not taking: Reported on 05/03/2023    [provider]    Allergies: Nsaids    Review of Systems  Constitutional:  Negative for appetite change and fatigue.  HENT:  Negative for congestion, ear discharge and sinus pressure.   Eyes:  Negative for discharge.  Respiratory:  Negative for cough.   Cardiovascular:  Negative for chest pain.  Gastrointestinal:  Negative for abdominal pain and diarrhea.  Genitourinary:  Negative for frequency and hematuria.  Musculoskeletal:  Negative for back pain.  Skin:  Negative for rash.  Neurological:  Positive for weakness. Negative for seizures and headaches.  Psychiatric/Behavioral:  Negative for hallucinations.     Updated Vital Signs BP (!) 143/108   Pulse 70   Temp 98.1 F (36.7 C) (Oral)   Ht 6' (1.829 m)   Wt 84 kg   SpO2 97%   BMI 25.12 kg/m   Physical Exam Vitals and nursing note reviewed.  Constitutional:      Appearance: He is well-developed.  HENT:     Head: Normocephalic.     Nose: Nose normal.  Eyes:     General: No scleral icterus.    Conjunctiva/sclera: Conjunctivae normal.  Neck:     Thyroid: No thyromegaly.  Cardiovascular:     Rate and Rhythm: Normal rate and regular rhythm.  Heart sounds: No murmur heard.    No friction rub. No gallop.  Pulmonary:     Breath sounds: No stridor. No wheezing or rales.  Chest:     Chest wall: No tenderness.  Abdominal:     General: There is no distension.     Tenderness: There is no abdominal tenderness. There is no rebound.  Musculoskeletal:        General: Normal range of motion.     Cervical back: Neck supple.  Lymphadenopathy:     Cervical: No cervical adenopathy.  Skin:    Findings: No erythema or rash.  Neurological:     Mental Status: He is alert and oriented to person, place, and time.     Motor: No abnormal muscle tone.      Coordination: Coordination normal.  Psychiatric:        Behavior: Behavior normal.     (all labs ordered are listed, but only abnormal results are displayed) Labs Reviewed  CBC WITH DIFFERENTIAL/PLATELET    EKG: None  Radiology: No results found.   Procedures   Medications Ordered in the ED - No data to display                                  Medical Decision Making Amount and/or Complexity of Data Reviewed Labs: ordered.   Patient left AMA without speaking to me prior to leaving     Final diagnoses:  None    ED Discharge Orders     None          Suzette Pac, MD 11/04/23 1642

## 2023-11-04 NOTE — ED Notes (Signed)
 Pt states he is not going to wait around for hours and hours to get dialysis. Pt refuses labs at this time.
